# Patient Record
Sex: Female | Born: 1935
Health system: Southern US, Community
[De-identification: ages and names within clinical notes are randomized; demographics above are authoritative.]

## PROBLEM LIST (undated history)

## (undated) DIAGNOSIS — Z9889 Other specified postprocedural states: Secondary | ICD-10-CM

## (undated) DIAGNOSIS — T4145XA Adverse effect of unspecified anesthetic, initial encounter: Secondary | ICD-10-CM

## (undated) DIAGNOSIS — E785 Hyperlipidemia, unspecified: Secondary | ICD-10-CM

## (undated) DIAGNOSIS — I10 Essential (primary) hypertension: Secondary | ICD-10-CM

## (undated) DIAGNOSIS — R112 Nausea with vomiting, unspecified: Secondary | ICD-10-CM

## (undated) DIAGNOSIS — G4730 Sleep apnea, unspecified: Secondary | ICD-10-CM

## (undated) DIAGNOSIS — M1990 Unspecified osteoarthritis, unspecified site: Secondary | ICD-10-CM

## (undated) DIAGNOSIS — T8859XA Other complications of anesthesia, initial encounter: Secondary | ICD-10-CM

## (undated) DIAGNOSIS — J449 Chronic obstructive pulmonary disease, unspecified: Secondary | ICD-10-CM

## (undated) DIAGNOSIS — M1120 Other chondrocalcinosis, unspecified site: Secondary | ICD-10-CM

## (undated) DIAGNOSIS — I773 Arterial fibromuscular dysplasia: Secondary | ICD-10-CM

## (undated) HISTORY — PX: TONSILLECTOMY: SUR1361

## (undated) HISTORY — PX: OTHER SURGICAL HISTORY: SHX169

## (undated) HISTORY — DX: Hyperlipidemia, unspecified: E78.5

## (undated) HISTORY — DX: Essential (primary) hypertension: I10

## (undated) HISTORY — DX: Sleep apnea, unspecified: G47.30

## (undated) HISTORY — DX: Chronic obstructive pulmonary disease, unspecified: J44.9

## (undated) HISTORY — DX: Arterial fibromuscular dysplasia: I77.3

---

## 1997-12-13 ENCOUNTER — Ambulatory Visit (HOSPITAL_COMMUNITY): Admission: RE | Admit: 1997-12-13 | Discharge: 1997-12-13 | Payer: Self-pay | Admitting: Endocrinology

## 1997-12-13 ENCOUNTER — Encounter: Payer: Self-pay | Admitting: Endocrinology

## 1998-10-16 ENCOUNTER — Ambulatory Visit (HOSPITAL_COMMUNITY): Admission: RE | Admit: 1998-10-16 | Discharge: 1998-10-16 | Payer: Self-pay | Admitting: Gastroenterology

## 1998-10-16 ENCOUNTER — Encounter (INDEPENDENT_AMBULATORY_CARE_PROVIDER_SITE_OTHER): Payer: Self-pay | Admitting: Specialist

## 1998-10-17 ENCOUNTER — Ambulatory Visit (HOSPITAL_COMMUNITY): Admission: RE | Admit: 1998-10-17 | Discharge: 1998-10-17 | Payer: Self-pay | Admitting: Gastroenterology

## 1998-10-17 ENCOUNTER — Encounter: Payer: Self-pay | Admitting: Gastroenterology

## 1999-01-03 ENCOUNTER — Encounter: Payer: Self-pay | Admitting: Endocrinology

## 1999-01-03 ENCOUNTER — Ambulatory Visit (HOSPITAL_COMMUNITY): Admission: RE | Admit: 1999-01-03 | Discharge: 1999-01-03 | Payer: Self-pay | Admitting: Endocrinology

## 2000-01-13 ENCOUNTER — Ambulatory Visit (HOSPITAL_COMMUNITY): Admission: RE | Admit: 2000-01-13 | Discharge: 2000-01-13 | Payer: Self-pay | Admitting: Endocrinology

## 2000-01-13 ENCOUNTER — Encounter: Payer: Self-pay | Admitting: Endocrinology

## 2000-01-30 ENCOUNTER — Other Ambulatory Visit: Admission: RE | Admit: 2000-01-30 | Discharge: 2000-01-30 | Payer: Self-pay | Admitting: *Deleted

## 2000-03-13 ENCOUNTER — Other Ambulatory Visit: Admission: RE | Admit: 2000-03-13 | Discharge: 2000-03-13 | Payer: Self-pay | Admitting: *Deleted

## 2000-03-13 ENCOUNTER — Encounter (INDEPENDENT_AMBULATORY_CARE_PROVIDER_SITE_OTHER): Payer: Self-pay | Admitting: Specialist

## 2000-09-08 ENCOUNTER — Inpatient Hospital Stay (HOSPITAL_COMMUNITY): Admission: RE | Admit: 2000-09-08 | Discharge: 2000-09-12 | Payer: Self-pay | Admitting: Neurological Surgery

## 2000-09-08 ENCOUNTER — Encounter: Payer: Self-pay | Admitting: Neurological Surgery

## 2000-09-08 ENCOUNTER — Encounter (INDEPENDENT_AMBULATORY_CARE_PROVIDER_SITE_OTHER): Payer: Self-pay | Admitting: *Deleted

## 2001-01-20 ENCOUNTER — Encounter: Payer: Self-pay | Admitting: *Deleted

## 2001-01-20 ENCOUNTER — Ambulatory Visit (HOSPITAL_COMMUNITY): Admission: RE | Admit: 2001-01-20 | Discharge: 2001-01-20 | Payer: Self-pay | Admitting: *Deleted

## 2001-01-26 ENCOUNTER — Encounter: Admission: RE | Admit: 2001-01-26 | Discharge: 2001-01-26 | Payer: Self-pay | Admitting: *Deleted

## 2001-01-26 ENCOUNTER — Encounter: Payer: Self-pay | Admitting: *Deleted

## 2001-02-01 ENCOUNTER — Other Ambulatory Visit: Admission: RE | Admit: 2001-02-01 | Discharge: 2001-02-01 | Payer: Self-pay | Admitting: *Deleted

## 2002-02-21 ENCOUNTER — Encounter (INDEPENDENT_AMBULATORY_CARE_PROVIDER_SITE_OTHER): Payer: Self-pay | Admitting: Specialist

## 2002-02-21 ENCOUNTER — Ambulatory Visit (HOSPITAL_COMMUNITY): Admission: RE | Admit: 2002-02-21 | Discharge: 2002-02-21 | Payer: Self-pay | Admitting: Gastroenterology

## 2002-03-01 ENCOUNTER — Other Ambulatory Visit: Admission: RE | Admit: 2002-03-01 | Discharge: 2002-03-01 | Payer: Self-pay | Admitting: *Deleted

## 2002-03-03 ENCOUNTER — Encounter: Admission: RE | Admit: 2002-03-03 | Discharge: 2002-03-03 | Payer: Self-pay | Admitting: *Deleted

## 2002-03-03 ENCOUNTER — Encounter: Payer: Self-pay | Admitting: *Deleted

## 2003-02-18 HISTORY — PX: BACK SURGERY: SHX140

## 2003-06-09 ENCOUNTER — Encounter: Admission: RE | Admit: 2003-06-09 | Discharge: 2003-06-09 | Payer: Self-pay | Admitting: Endocrinology

## 2003-10-18 ENCOUNTER — Other Ambulatory Visit: Admission: RE | Admit: 2003-10-18 | Discharge: 2003-10-18 | Payer: Self-pay | Admitting: *Deleted

## 2005-07-21 ENCOUNTER — Encounter: Admission: RE | Admit: 2005-07-21 | Discharge: 2005-07-21 | Payer: Self-pay | Admitting: Endocrinology

## 2005-08-05 ENCOUNTER — Encounter: Admission: RE | Admit: 2005-08-05 | Discharge: 2005-08-05 | Payer: Self-pay | Admitting: Endocrinology

## 2005-09-11 ENCOUNTER — Encounter: Admission: RE | Admit: 2005-09-11 | Discharge: 2005-09-11 | Payer: Self-pay | Admitting: Orthopedic Surgery

## 2005-09-13 ENCOUNTER — Encounter: Admission: RE | Admit: 2005-09-13 | Discharge: 2005-09-13 | Payer: Self-pay | Admitting: Orthopedic Surgery

## 2005-09-22 ENCOUNTER — Encounter (INDEPENDENT_AMBULATORY_CARE_PROVIDER_SITE_OTHER): Payer: Self-pay | Admitting: Specialist

## 2005-09-22 ENCOUNTER — Inpatient Hospital Stay (HOSPITAL_COMMUNITY): Admission: RE | Admit: 2005-09-22 | Discharge: 2005-09-23 | Payer: Self-pay | Admitting: Neurological Surgery

## 2006-09-10 ENCOUNTER — Encounter: Admission: RE | Admit: 2006-09-10 | Discharge: 2006-09-10 | Payer: Self-pay | Admitting: Endocrinology

## 2006-09-16 ENCOUNTER — Encounter: Admission: RE | Admit: 2006-09-16 | Discharge: 2006-09-16 | Payer: Self-pay | Admitting: Endocrinology

## 2007-10-14 ENCOUNTER — Encounter: Admission: RE | Admit: 2007-10-14 | Discharge: 2007-10-14 | Payer: Self-pay | Admitting: Endocrinology

## 2008-01-11 ENCOUNTER — Inpatient Hospital Stay (HOSPITAL_COMMUNITY): Admission: RE | Admit: 2008-01-11 | Discharge: 2008-01-19 | Payer: Self-pay | Admitting: Neurological Surgery

## 2008-01-11 ENCOUNTER — Encounter (INDEPENDENT_AMBULATORY_CARE_PROVIDER_SITE_OTHER): Payer: Self-pay | Admitting: Neurological Surgery

## 2008-04-14 ENCOUNTER — Ambulatory Visit: Admission: RE | Admit: 2008-04-14 | Discharge: 2008-05-22 | Payer: Self-pay | Admitting: Radiation Oncology

## 2008-10-01 ENCOUNTER — Ambulatory Visit (HOSPITAL_BASED_OUTPATIENT_CLINIC_OR_DEPARTMENT_OTHER): Admission: RE | Admit: 2008-10-01 | Discharge: 2008-10-01 | Payer: Self-pay | Admitting: Endocrinology

## 2008-10-01 ENCOUNTER — Encounter: Payer: Self-pay | Admitting: Internal Medicine

## 2008-10-07 ENCOUNTER — Ambulatory Visit: Payer: Self-pay | Admitting: Internal Medicine

## 2008-10-25 ENCOUNTER — Encounter: Admission: RE | Admit: 2008-10-25 | Discharge: 2008-10-25 | Payer: Self-pay | Admitting: Endocrinology

## 2008-12-19 ENCOUNTER — Ambulatory Visit: Payer: Self-pay | Admitting: Internal Medicine

## 2008-12-19 DIAGNOSIS — E785 Hyperlipidemia, unspecified: Secondary | ICD-10-CM

## 2008-12-19 DIAGNOSIS — G4733 Obstructive sleep apnea (adult) (pediatric): Secondary | ICD-10-CM

## 2008-12-19 DIAGNOSIS — I10 Essential (primary) hypertension: Secondary | ICD-10-CM | POA: Insufficient documentation

## 2008-12-24 DIAGNOSIS — J309 Allergic rhinitis, unspecified: Secondary | ICD-10-CM | POA: Insufficient documentation

## 2008-12-26 ENCOUNTER — Encounter: Payer: Self-pay | Admitting: Internal Medicine

## 2009-01-02 ENCOUNTER — Encounter: Payer: Self-pay | Admitting: Internal Medicine

## 2009-01-18 ENCOUNTER — Telehealth: Payer: Self-pay | Admitting: Internal Medicine

## 2009-01-28 ENCOUNTER — Encounter: Payer: Self-pay | Admitting: Internal Medicine

## 2009-01-30 ENCOUNTER — Ambulatory Visit: Payer: Self-pay | Admitting: Internal Medicine

## 2009-01-30 DIAGNOSIS — R011 Cardiac murmur, unspecified: Secondary | ICD-10-CM

## 2009-02-14 ENCOUNTER — Encounter: Payer: Self-pay | Admitting: Internal Medicine

## 2009-02-21 ENCOUNTER — Encounter: Payer: Self-pay | Admitting: Internal Medicine

## 2009-03-26 ENCOUNTER — Encounter: Payer: Self-pay | Admitting: Internal Medicine

## 2009-05-01 ENCOUNTER — Encounter: Payer: Self-pay | Admitting: Internal Medicine

## 2009-07-12 ENCOUNTER — Encounter: Payer: Self-pay | Admitting: Internal Medicine

## 2009-07-30 ENCOUNTER — Ambulatory Visit: Payer: Self-pay | Admitting: Internal Medicine

## 2009-08-08 ENCOUNTER — Ambulatory Visit: Payer: Self-pay | Admitting: Internal Medicine

## 2009-10-29 ENCOUNTER — Ambulatory Visit: Payer: Self-pay | Admitting: Internal Medicine

## 2009-10-29 DIAGNOSIS — R0602 Shortness of breath: Secondary | ICD-10-CM

## 2009-11-01 ENCOUNTER — Ambulatory Visit (HOSPITAL_COMMUNITY): Admission: RE | Admit: 2009-11-01 | Discharge: 2009-11-01 | Payer: Self-pay | Admitting: Internal Medicine

## 2009-11-01 ENCOUNTER — Encounter: Payer: Self-pay | Admitting: Internal Medicine

## 2009-11-01 ENCOUNTER — Ambulatory Visit: Payer: Self-pay

## 2009-11-01 ENCOUNTER — Ambulatory Visit: Payer: Self-pay | Admitting: Internal Medicine

## 2009-12-19 ENCOUNTER — Encounter: Admission: RE | Admit: 2009-12-19 | Discharge: 2009-12-19 | Payer: Self-pay | Admitting: Endocrinology

## 2010-01-06 ENCOUNTER — Encounter: Payer: Self-pay | Admitting: Internal Medicine

## 2010-01-29 ENCOUNTER — Ambulatory Visit: Payer: Self-pay | Admitting: Internal Medicine

## 2010-01-29 DIAGNOSIS — E1149 Type 2 diabetes mellitus with other diabetic neurological complication: Secondary | ICD-10-CM | POA: Insufficient documentation

## 2010-02-27 ENCOUNTER — Encounter: Payer: Self-pay | Admitting: Cardiology

## 2010-02-27 ENCOUNTER — Ambulatory Visit
Admission: RE | Admit: 2010-02-27 | Discharge: 2010-02-27 | Payer: Self-pay | Source: Home / Self Care | Attending: Cardiology | Admitting: Cardiology

## 2010-02-27 ENCOUNTER — Other Ambulatory Visit: Payer: Self-pay | Admitting: Cardiology

## 2010-02-27 DIAGNOSIS — I359 Nonrheumatic aortic valve disorder, unspecified: Secondary | ICD-10-CM | POA: Insufficient documentation

## 2010-02-27 LAB — BRAIN NATRIURETIC PEPTIDE: Pro B Natriuretic peptide (BNP): 59.3 pg/mL (ref 0.0–100.0)

## 2010-03-10 ENCOUNTER — Encounter: Payer: Self-pay | Admitting: Endocrinology

## 2010-03-19 NOTE — Letter (Signed)
Summary: CMN  CMN   Imported By: Valinda Hoar 07/12/2009 09:35:50  _____________________________________________________________________  External Attachment:    Type:   Image     Comment:   External Document

## 2010-03-19 NOTE — Assessment & Plan Note (Signed)
Summary: rov 3 months///kp   Copy to:  Dr. Corrin Parker Primary Chaise Mahabir/Referring Taniqua Issa:  Dr. Corrin Parker  CC:  3 month follow up visit-sleep and allergic rhinitis-using CPAP and O2 every night but would like to possibly come off O2.[name]  History of Present Illness: CC:  6 month follow up visit-breathing is okay; breathing at night is worse; low O2 levels at night per pt.;using CPAP machine at night.. History of Present Illness: January 30, 2009- OSA, Allergic rhinitis Continues CPAP at 15, with supplemental oxygen during sleep at 2 L/M.  We discussed mask comfort and  sleep habits. She is very compliant, as documented by recent download, and she says she sleeps well, without significant daytime sleepiness. We discussed how to adjust the RAMP feature. She is breathing comfortably on room air during the day. Consider PFT on return.  July 30, 2009- OSA, Allergic rhinitis- Now on CPAP at 15 with supplemental oxygen. She sleeps much better and uses it all night every night.. She is concerned about whether she needs the oxygen, prompted by cost of providing the oxygen while on vacation.  She is more dyspneic with exertion than she should be, but no acute change. Activity is limited more by bad knees. Denies cough or wheeze. Denies chest pain or palpitation. Rhinitis- continues nasal stuffines and watery eyes. Some sneeze. Daily Zyrtec.Has flonase used for stretches when needed.  October 29, 2009- OSA, Allergic rhinits, Hypoxia/sleep, Heart murmur Using CPAP 15 with Oxygen at 2. She fights the mask and machine some nights. Bothersome mask leak at times. HCS tried a smaller mask- but never offered much choice. We discussed CPAP desensitiztion with the [facility] Center staff. She also minds needing oxygen, finding it very inconvenient if she needs to go out of town for a while. Denies hx of cardiac disease except murmur, or DVT/ VTE. Anemic in past- never transfused. She wants to see about a  portable concentrator for travel.[name] Has heart murmur and always retains fluid.     Preventive Screening-Counseling & Management  Alcohol-Tobacco     Smoking Status: quit     Packs/Day: 1.0     Year Started: 1954     Year Quit: 1999  Current Medications (verified): 1)  Synthroid 25 Mcg Tabs (Levothyroxine Sodium) .[name].. 1 Once Daily 2)  Amlodipine Besylate 10 Mg Tabs (Amlodipine Besylate) .[name].. 1 Once Daily 3)  Janumet 50-1000 Mg Tabs (Sitagliptin-Metformin Hcl) .[name].. 1 Once Daily 4)  Lipitor 80 Mg Tabs (Atorvastatin Calcium) .[name].. 1 Once Daily 5)  Klor-Con 10 10 Meq Cr-Tabs (Potassium Chloride) .[name].. 1 Once Daily 6)  Gabapentin 600 Mg Tabs (Gabapentin) .[name].. 1 Once Daily 7)  Nabumetone 750 Mg Tabs (Nabumetone) .[name].. 1 Two Times A Day 8)  Fosamax 70 Mg Tabs (Alendronate Sodium) .... Once Per Wk 9)  Aspirin 81 Mg Tbec (Aspirin) .[name].. 1 Once Daily 10)  Zyrtec Allergy 10 Mg Caps (Cetirizine Hcl) .[name].. 1 Once Daily 11)  Calcium Citrate 1200mg  .... Take 1 By Mouth Two Times A Day 12)  Cpap 15, O2 @ 2l/m Hcs 13)  Oxygen 2 L/m Through Cpap, For Sleep 14)  Triamterene-Hctz 37.5-25 Mg Tabs (Triamterene-Hctz) .... Take 1 By Mouth Once Daily  Allergies (verified): 1)  ! * Valtrex  Past History:  Past Surgical History: Last updated: 12/19/2008 Pituitary tumor removed x 2 Back surgery 2005 Tonsillectomy nasal septal deviation  Family History: Last updated: 12/19/2008 Stroke- Father Breast CA- Mother  Social History: Last updated: 12/19/2008 Widowed Children Former smoker.  Quit in 1999.  Smoked for approx 35 yrs up to 1 ppd. 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  Social History: Packs/Day:  1.0  Review of Systems      See HPI       The patient complains of shortness of breath with activity.  The patient denies shortness of breath at rest, productive cough, non-productive cough, coughing up blood, chest pain, irregular heartbeats, acid heartburn, indigestion, loss of appetite, weight change, abdominal pain, difficulty swallowing, sore throat, tooth/dental problems, headaches, nasal congestion/difficulty breathing through nose, and sneezing.    Vital Signs:  Patient profile:   76 year old female Height:      63 inches Weight:      193.25 pounds BMI:     34.36 O2 Sat:      95 % on Room air Pulse rate:   73 / minute BP sitting:   138 / 78  (left arm) Cuff size:   regular  Vitals Entered By: Reynaldo Minium CMA (October 29, 2009 11:43 AM)  O2 Flow:  Room air CC: 3 month follow up visit-sleep and allergic rhinitis-using CPAP and O2 every night but would like to possibly come off O2.   Physical Exam  Additional Exam:  General: A/Ox3; pleasant and cooperative, NAD, overweight SKIN: no rash, lesions NODES: no lymphadenopathy HEENT: [place]/AT, EOM- WNL, proptosis, Conjuctivae- clear, PERRLA, TM-WNL, Nose- clear, Throat- clear and wnl, Mallampati  III NECK: Supple w/ fair ROM, JVD- none, normal carotid impulses w/o bruits Thyroid- normal to palpation CHEST: few rales right mid back HEART: RRR, III/VI systolic murmur mid chest, radiating to bilateral neck ABDOMEN: Soft and nl; nml bowel sounds; no organomegaly or masses noted ZOX:WRUE, nl pulses,  heavy legs , small superficial varices, 1+ edema NEURO: Grossly intact to observation  -   Impression & Recommendations:  Problem # 1:  SLEEP APNEA (ICD-780.57)  She needs mask reassessment and I will offer desensitization with Sleep Center staff Pressure remains at 15  Problem # 2:  DYSPNEA (ICD-786.05)  Question why she desaturates at night. She did smoke a lot, is overweight, and  these may explain her low DLCO. She has heavy legs and occult PE in past can't be rulled out. She  has heart murmur with hemodynamic significance unclear. I will get ECHO, looking especially for right side overload and valvular disease.  We can see if her DME could get her a portable concentrator so she could travel more easily without giving up oxygen at night to do so.,  Medications Added to Medication List This Visit: 1)  Triamterene-hctz 37.5-25 Mg Tabs (Triamterene-hctz) .... Take 1 by mouth once daily 2)  Portable Oxygn Concentrator For Use During Sleep With Travel  .... 2 l/m  Other Orders: Est. Patient Level IV (45409) DME Referral (DME) Echo Referral (Echo)  Patient Instructions: 1)  Please schedule a follow-up appointment in 3 months. 2)  See [facility] about portable concentrator and scheduling Echocardiogram 3)  Flu vax  Prevention & Chronic Care Immunizations   Influenza vaccine: Not documented    Tetanus booster: Not documented    Pneumococcal vaccine: Not documented    H. zoster vaccine: Not documented  Colorectal Screening   Hemoccult: Not documented  Colonoscopy: Not documented  Other Screening   Pap smear: Not documented    Mammogram: Not documented    DXA bone density scan: Not documented   Smoking status: quit  (10/29/2009)  Lipids   Total Cholesterol: Not documented   LDL: Not documented   LDL Direct: Not documented   HDL: Not documented   Triglycerides: Not documented    SGOT (AST): Not documented   SGPT (ALT): Not documented   Alkaline phosphatase: Not documented   Total bilirubin: Not documented  Hypertension   Last Blood Pressure: 138 / 78  (10/29/2009)   Serum creatinine: Not documented   Serum potassium Not documented  Self-Management Support :    Hypertension self-management support: Not documented    Lipid self-management support: Not documented      Appended Document: rov 3 months///kp PFT- 08/08/09 FEV1 1.98/ 107%;  FEV1/FVC 0.77; small airways improved 20% after bronchodilator; Normal Lung Volumes; DLCO reduced at 44%.

## 2010-03-19 NOTE — Letter (Signed)
Summary: CMN CPAP/HCS  CMN CPAP/HCS   Imported By: Lester Bayonne 02/23/2009 08:01:35  _____________________________________________________________________  External Attachment:    Type:   Image     Comment:   External Document

## 2010-03-19 NOTE — Assessment & Plan Note (Signed)
Summary: ROV 6 MONTHS///KP   Copy to:  Dr. Corrin Parker Primary Provider/Referring Provider:  Dr. Corrin Parker  CC:  6 month follow up visit-breathing is okay; breathing at night is worse; low O2 levels at night per pt.;using CPAP machine at night..  History of Present Illness: January 30, 2009- OSA, Allergic rhinitis Continues CPAP at 15, with supplemental oxygen during sleep at 2 L/M.  We ddiscussed mask comfort and  sleep habits. She is very compliant, as documented by recent download, and she says she sleeps well, without significant daytime sleepiness. We discussed how to adjust the RAMP feature. She is breathing comfortably on room air during the day.  Consider PFT on return.  July 30, 2009- OSA, Allergic rhinitis- Now on CPAP at 15 with supplemental oxygen. She sleeps much better and uses it all night every night.. She is concerned about whether she needs the oxygen, prompted by cost of providing the oxygen while on vacation.  She is more dyspneic with exertion than she should be, but no acute change. Activity is limited more by bad knees. Denies cough or wheeze. Denies chest pain or palpitation. Rhinitis- continues nasal stuffines and watery eyes. Some sneeze. Daily Zyrtec.Has flonase used for stretches when needed.     Preventive Screening-Counseling & Management  Alcohol-Tobacco     Smoking Status: quit     Year Started: 1954     Year Quit: 1999  Current Medications (verified): 1)  Synthroid 25 Mcg Tabs (Levothyroxine Sodium) .Marland Kitchen.. 1 Once Daily 2)  Amlodipine Besylate 10 Mg Tabs (Amlodipine Besylate) .Marland Kitchen.. 1 Once Daily 3)  Janumet 50-1000 Mg Tabs (Sitagliptin-Metformin Hcl) .Marland Kitchen.. 1 Once Daily 4)  Lipitor 80 Mg Tabs (Atorvastatin Calcium) .Marland Kitchen.. 1 Once Daily 5)  Klor-Con 10 10 Meq Cr-Tabs (Potassium Chloride) .Marland Kitchen.. 1 Once Daily 6)  Gabapentin 600 Mg Tabs (Gabapentin) .Marland Kitchen.. 1 Once Daily 7)  Nabumetone 750 Mg Tabs (Nabumetone) .Marland Kitchen.. 1 Two Times A Day 8)  Fosamax 70 Mg Tabs  (Alendronate Sodium) .... Once Per Wk 9)  Aspirin 81 Mg Tbec (Aspirin) .Marland Kitchen.. 1 Once Daily 10)  Zyrtec Allergy 10 Mg Caps (Cetirizine Hcl) .Marland Kitchen.. 1 Once Daily 11)  Calcium Citrate 1200mg  .... Take 1 By Mouth Two Times A Day 12)  Cpap 15, O2 @ 2l/m Hcs 13)  Oxygen 2 L/m Through Cpap, For Sleep  Allergies (verified): 1)  ! * Valtrex  Past History:  Past Medical History: Last updated: 12/19/2008 Diabetes Hyperlipidemia Hypertension Sleep Apnea-AHI 89/hr Allergic Rhinitis Pituitary surgery/ replacement  Past Surgical History: Last updated: 12/19/2008 Pituitary tumor removed x 2 Back surgery 2005 Tonsillectomy nasal septal deviation  Family History: Last updated: 12/19/2008 Stroke- Father Breast CA- Mother  Social History: Last updated: 12/19/2008 Widowed Children Former smoker.  Quit in 1999.  Smoked for approx 35 yrs up to 1 ppd. 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    Vital Signs:  Patient profile:   76 year old female Height:      63 inches Weight:      185 pounds BMI:     32.89 O2  Sat:      93 % on Room air Pulse rate:   78 / minute BP sitting:   178 / 98  (left arm) Cuff size:   regular  Vitals Entered By: Reynaldo Minium CMA (July 30, 2009 10:39 AM)  O2 Flow:  Room air CC: 6 month follow up visit-breathing is okay; breathing at night is worse; low O2 levels at night per pt.;using CPAP machine at night.   Physical Exam  Additional Exam:  General: A/Ox3; pleasant and cooperative,  NAD, overweight SKIN: no rash, lesions NODES: no lymphadenopathy HEENT: [place]/AT, EOM- WNL, proptosis, Conjuctivae- clear, PERRLA, TM-WNL, Nose- clear, Throat- clear and wnl, Mallampati  III NECK: Supple w/ fair ROM, JVD- none, normal carotid impulses w/o bruits Thyroid- normal to palpation CHEST: Clear to P&A HEART: RRR, III/VI systolic murmur mid chest, radiating to bilateral neck ABDOMEN: Soft and nl; nml bowel sounds; no organomegaly or masses noted [id number], nl pulses, no edema  NEURO: Grossly intact to observation  -   Impression & Recommendations:  Problem # 1:  CARDIAC MURMUR (ICD-785.2)  She is aware she can have cardiology assessment if needed- she has discussed with Dr Lin Givens.  Problem # 2:  ALLERGIC RHINITIS (ICD-477.9)  Fair control but on zyrtec and flonase. Her updated medication list for this problem includes:    Zyrtec Allergy 10 Mg Caps (Cetirizine hcl) .Marland Kitchen... 1 once daily  Problem # 3:  SLEEP APNEA (ICD-780.57)  The question for today is whether she still needs oxygen during sleep. This includes question of her daytime lung function. We will get PFT and consider when to rechck oxygen during sleep.  Medications Added to Medication List This Visit: 1)  Calcium Citrate 1200mg   .... Take 1 by mouth two times a day  Other Orders: Est. Patient Level IV (10272)  Patient Instructions: 1)  Please schedule a follow-up appointment in 3 months. 2)  If nasal congestion and watery eyes get worse, we can look harder at the allergy situation. 3)  schedule PFT. This will begin to understand your lung functiuon as we consider what to do about oxygen during sleep.

## 2010-03-19 NOTE — Letter (Signed)
Summary: CMN for CPAP Supplies/[facility]  CMN for CPAP Supplies/[facility]   Imported By: Sherian Rein 02/21/2009 07:48:01  _____________________________________________________________________  External Attachment:    Type:   Image     Comment:   External Document

## 2010-03-19 NOTE — Letter (Signed)
Summary: CMN for CPAP Supplies/[facility]  CMN for CPAP Supplies/[facility]   Imported By: Sherian Rein 05/04/2009 11:45:21  _____________________________________________________________________  External Attachment:    Type:   Image     Comment:   External Document

## 2010-03-19 NOTE — Miscellaneous (Signed)
Summary: Orders Update-pft charges//jwr  Clinical Lists Changes  Orders: Added new Service order of Carbon Monoxide diffusing w/capacity (94720) - Signed Added new Service order of Lung Volumes (94240) - Signed Added new Service order of Spirometry (Pre & Post) (94060) - Signed 

## 2010-03-21 NOTE — Assessment & Plan Note (Signed)
Summary: 3 months/apc   Copy to:  Dr. Corrin Parker Primary Provider/Referring Provider:  Dr. Corrin Parker  CC:  3 month follow up visit-sleep-using CPAP each night..  History of Present Illness: July 30, 2009- OSA, Allergic rhinitis- Now on CPAP at 15 with supplemental oxygen. She sleeps much better and uses it all night every night.. She is concerned about whether she needs the oxygen, prompted by cost of providing the oxygen while on vacation.  She is more dyspneic with exertion than she should be, but no acute change. Activity is limited more by bad knees. Denies cough or wheeze. Denies chest pain or palpitation. Rhinitis- continues nasal stuffines and watery eyes. Some sneeze. Daily Zyrtec.Has flonase used for stretches when needed.  October 29, 2009- OSA, Allergic rhinits, Hypoxia/sleep, Heart murmur Using CPAP 15 with Oxygen at 2. She fights the mask and machine some nights. Bothersome mask leak at times. HCS tried a smaller mask- but never offered much choice. We discussed CPAP desensitiztion with the [facility] staff. She also minds needing oxygen, finding it very inconvenient if she needs to go out of town for a while. Denies hx of cardiac disease except murmur, or DVT/ VTE. Anemic in past- never transfused. She wants to see about a portable concentrator for travel.Marland Kitchen Has heart murmur and always retains fluid.  January 29, 2010- OSA, Allergic rhinits, Hypoxia/sleep, Heart murmur Nurse-CC: 3 month follow up visit-sleep-using CPAP each night. Using CPAP all night every night without problem. It definitely has helped. She asks for help understanding results of Echo- EF 60-65%, diastolic CHF grade 1, moderate aortic stenosis.     Preventive Screening-Counseling & Management  Alcohol-Tobacco     Smoking Status: quit     Packs/Day: 1.0     Year Started: 1954     Year Quit: 1999  Current Medications (verified): 1)  Synthroid 25 Mcg Tabs (Levothyroxine Sodium) .Marland Kitchen.. 1 Once  Daily 2)  Amlodipine Besylate 10 Mg Tabs (Amlodipine Besylate) .... Take 1 1/2 By Mouth Once Daily 3)  Janumet 50-1000 Mg Tabs (Sitagliptin-Metformin Hcl) .Marland Kitchen.. 1 Once Daily 4)  Lipitor 80 Mg Tabs (Atorvastatin Calcium) .Marland Kitchen.. 1 Once Daily 5)  Klor-Con 10 10 Meq Cr-Tabs (Potassium Chloride) .Marland Kitchen.. 1 Once Daily 6)  Gabapentin 600 Mg Tabs (Gabapentin) .Marland Kitchen.. 1 Once Daily 7)  Nabumetone 750 Mg Tabs (Nabumetone) .Marland Kitchen.. 1 Two Times A Day 8)  Fosamax 70 Mg Tabs (Alendronate Sodium) .... Once Per Wk 9)  Aspirin 81 Mg Tbec (Aspirin) .Marland Kitchen.. 1 Once Daily 10)  Zyrtec Allergy 10 Mg Caps (Cetirizine Hcl) .Marland Kitchen.. 1 Once Daily 11)  Calcium Citrate 1200mg  .... Take 1 By Mouth Two Times A Day 12)  Cpap 15, O2 @ 2l/m Hcs 13)  Oxygen 2 L/m Through Cpap, For Sleep 14)  Portable Oxygn Concentrator For Use During Sleep With Travel .... 2 L/m  Allergies (verified): 1)  ! * Valtrex  Past History:  Past Medical History: Last updated: 10/29/2009 Diabetes Hyperlipidemia Hypertension Sleep Apnea-AHI 89/hr Allergic Rhinitis Pituitary surgery/ replacement COPD -PFT-08/08/09- FEV1 1.98/107%; R 0.77; Small airway obst w/ resp to dilator; DLCO 44%.  Past Surgical History: Last updated: 12/19/2008 Pituitary tumor removed x 2 Back surgery 2005 Tonsillectomy nasal septal deviation  Family History: Last updated: 12/19/2008 Stroke- Father Breast CA- Mother  Social History: Last updated: 12/19/2008 Widowed Children Former smoker.  Quit in 1999.  Smoked for approx 35 yrs up to 1 ppd. 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  The patient denies shortness of breath at rest, productive cough, chest pain, irregular heartbeats, acid heartburn, indigestion, loss of appetite, weight change, abdominal pain, difficulty  swallowing, sore throat, tooth/dental problems, headaches, nasal congestion/difficulty breathing through nose, and sneezing.    Vital Signs:  Patient profile:   76 year old female Height:      63 inches Weight:      199 pounds BMI:     35.38 O2 Sat:      96 % on Room air Pulse rate:   74 / minute BP sitting:   128 / 78  (left arm) Cuff size:   regular  Vitals Entered By: Reynaldo Minium CMA (January 29, 2010 11:28 AM)  O2 Flow:  Room air CC: 3 month follow up visit-sleep-using CPAP each night.   Physical Exam  Additional Exam:  General: A/Ox3; pleasant and cooperative, NAD, overweight SKIN: no rash, lesions NODES: no lymphadenopathy HEENT: [place]/AT, EOM- WNL, proptosis, Conjuctivae- clear, PERRLA, TM-WNL, Nose- clear, Throat- clear and wnl, Mallampati  III NECK: Supple w/ fair ROM, JVD- none, normal carotid impulses w/o bruits Thyroid- normal to palpation CHEST: few crackles right mid back HEART: RRR, III/VI systolic murmur mid chest, radiating to bilateral neck ABDOMEN: Soft and nl; nml bowel sounds; no organomegaly or masses noted ZOX:WRUE, nl pulses,  heavy legs , small superficial varices, 1+ edema NEURO: Grossly intact to observation  -   Impression & Recommendations:  Problem # 1:  SLEEP APNEA (ICD-780.57)  Good compliance and control. She will continue with CPAP  Orders: Est. Patient Level IV (45409) Sleep Disorder Referral (Sleep Disorder)  Problem # 2:  CARDIAC MURMUR (ICD-785.2)  Moderate aortic stenosis and also Diastolic Dysfunction as seen on Echo. I recommended she let a cardiologist see her to discuss.  Medications Added to Medication List This Visit: 1)  Amlodipine Besylate 10 Mg Tabs (Amlodipine besylate) .... Take 1 1/2 by mouth once daily  Other Orders: Cardiology Referral (Cardiology)  Patient Instructions: 1)  Please schedule a follow-up appointment in 1 year.Please  call sooner as needed 2)  Continue CPAP at 15 with oxygen for sleep at 2  L 3)  See [facility] to set up cardiology referral for your aortic stenosis 4)  [facility] can also get you an appointment to work on CPAP mask comfort with the sleep center staff

## 2010-03-21 NOTE — Assessment & Plan Note (Signed)
Summary: np6/aortic stynosis/lg   Visit Type:  Follow-up Primary Provider:  Dr. Corrin Parker  CC:  Aortic stenosis.  History of Present Illness: The patient is referred for evaluation of aortic stenosis. She has a history of a heart murmur. She has had progressive dyspnea and is treated by Dr. Maple Hudson. She says this has been slowly progressive but worse in the last 6 months. She will get dyspneic walking up a flight of stairs though she can continue on when she gets to the top. She does not describe resting shortness of breath, PND or orthopnea. She does not describe chest pressure, neck or arm discomfort. She denies palpitations, presyncope or syncope. Unfortunately she somewhat limited in that could be because of joint pains.  He was recently sent for an echocardiogram which demonstrated moderate calcification of her aortic valve with moderate stenosis. She has reported a murmur for some time which has been followed clinically. She has had no prior cardiovascular testing.  Current Medications (verified): 1)  Synthroid 25 Mcg Tabs (Levothyroxine Sodium) .Marland Kitchen.. 1 Once Daily 2)  Amlodipine Besylate 10 Mg Tabs (Amlodipine Besylate) .... Take 1 1/2 By Mouth Once Daily 3)  Janumet 50-1000 Mg Tabs (Sitagliptin-Metformin Hcl) .Marland Kitchen.. 1 Once Daily 4)  Lipitor 80 Mg Tabs (Atorvastatin Calcium) .Marland Kitchen.. 1 Once Daily 5)  Klor-Con 10 10 Meq Cr-Tabs (Potassium Chloride) .Marland Kitchen.. 1 Once Daily 6)  Gabapentin 600 Mg Tabs (Gabapentin) .Marland Kitchen.. 1 Once Daily 7)  Nabumetone 750 Mg Tabs (Nabumetone) .Marland Kitchen.. 1 Two Times A Day 8)  Fosamax 70 Mg Tabs (Alendronate Sodium) .... Once Per Wk 9)  Aspirin 81 Mg Tbec (Aspirin) .Marland Kitchen.. 1 Once Daily 10)  Zyrtec Allergy 10 Mg Caps (Cetirizine Hcl) .Marland Kitchen.. 1 Once Daily 11)  Calcium Citrate 1200mg  .... Take 1 By Mouth Two Times A Day 12)  Cpap 15, O2 @ 2l/m Hcs 13)  Oxygen 2 L/m Through Cpap, For Sleep 14)  Portable Oxygn Concentrator For Use During Sleep With Travel .... 2 L/m  Allergies  (verified): 1)  ! * Valtrex  Past History:  Past Medical History: Diabetes Hyperlipidemia Hypertension Sleep Apnea-AHI 89/hr Allergic Rhinitis Pituitary surgery/ replacement COPD -PFT-08/08/09- FEV1 1.98/107%; R 0.77; Small airway obst w/ resp to dilator; DLCO 44%. Fibromuscular dysplasia/RAS  Past Surgical History: Pituitary tumor removed x 2 Back surgery 2005 Tonsillectomy Nasal septal deviation Bilateral renal bypass  Social History: Widowed Children Former smoker.  Quit in 1999.  Smoked for approx 35 yrs up to 1 ppd. No ETOH Lives alone Retired Programmer, systems    Review of Systems       As stated in the HPI and negative for all other systems.   Vital Signs:  Patient profile:   76 year old female Height:      63 inches Weight:      196 pounds BMI:     34.85 Pulse rate:   72 / minute Resp:     16 per minute BP sitting:   168 / 98  (right arm)  Vitals Entered By: Marrion Coy, CNA (February 27, 2010 10:24 AM)  Physical Exam  General:  Well developed, well nourished, in no acute distress. Head:  normocephalic and atraumatic Eyes:  PERRLA/EOM intact; conjunctiva and lids normal. Mouth:  Teeth, gums and palate normal. Oral mucosa normal. Neck:  Neck supple, no JVD. No masses, thyromegaly or abnormal cervical nodes. Chest Wall:  no deformities or breast masses noted Lungs:  Clear bilaterally to auscultation and percussion. Abdomen:  Bowel sounds  positive; abdomen soft and non-tender without masses, organomegaly, or hernias noted. No hepatosplenomegaly. Msk:  Back normal, normal gait. Muscle strength and tone normal. Extremities:  No clubbing or cyanosis, mild bilateral nonpitting lower extremity edema Neurologic:  Alert and oriented x 3. Skin:  Intact without lesions or rashes. Cervical Nodes:  no significant adenopathy Axillary Nodes:  no significant adenopathy Inguinal Nodes:  no significant adenopathy Psych:  Normal  affect.   Detailed Cardiovascular Exam  Neck    Carotids: Transmitted systolic murmur    Neck Veins: Normal, no JVD.    Heart    Inspection: no deformities or lifts noted.      Palpation: normal PMI with no thrills palpable.      Auscultation: S1 and S2 within normal limits, no S3, no S4, no clicks, no rubs, 3/6 apical systolic murmur radiating slightly out of the aortic outflow tract, no diastolic murmurs.  Vascular    Abdominal Aorta: no palpable masses, pulsations, or audible bruits.      Femoral Pulses: normal femoral pulses bilaterally.      Pedal Pulses: normal pedal pulses bilaterally.      Radial Pulses: normal radial pulses bilaterally.      Peripheral Circulation: no clubbing, cyanosis, or with normal capillary refill.     EKG  Procedure date:  02/27/2010  Findings:      Sinus rhythm, rate 71, axis within normal limits, intervals within normal limits, no acute ST-T wave changes  Impression & Recommendations:  Problem # 1:  AORTIC VALVE DISORDERS (ICD-424.1) Patient has moderate aortic stenosis. I doubt that this is contributing to her dyspnea we'll assess this with a BNP level. We talked at length about the physiology, symptoms and management of this condition. She will get an echocardiogram again in one year to make sure it is not progressing rapidly  Problem # 2:  DYSPNEA (ICD-786.05) I believe this to be multifactorial and will check the BNP level. We also discussed the need for diet and exercise.  Problem # 3:  HYPERTENSION (ICD-401.9) Her blood pressure is elevated but she did not take her medications. I asked her to get a home blood pressure monitor and keep a diary to make sure she has adequate control. However, today I will not change her regimen.  Other Orders: TLB-BNP (B-Natriuretic Peptide) (83880-[id number])  Patient Instructions: 1)  Your physician recommends that you schedule a follow-up appointment in: 1 year. Also have echocardiogram done in one  year. 2)  Your physician recommends that you continue on your current medications as directed. Please refer to the Current Medication list given to you today. 3)  Your physician has requested that you have an echocardiogram.  Echocardiography is a painless test that uses sound waves to create images of your heart. It provides your doctor with information about the size and shape of your heart and how well your heart's chambers and valves are working.  This procedure takes approximately one hour. There are no restrictions for this procedure. To be done in one year.

## 2010-03-29 ENCOUNTER — Encounter: Payer: Self-pay | Admitting: Internal Medicine

## 2010-04-16 NOTE — Letter (Signed)
Summary: CMN for PAP Device/[employer]  CMN for PAP Device/[employer]   Imported By: Sherian Rein 04/08/2010 09:28:56  _____________________________________________________________________  External Attachment:    Type:   Image     Comment:   External Document

## 2010-05-08 ENCOUNTER — Telehealth: Payer: Self-pay | Admitting: Cardiology

## 2010-05-08 NOTE — Telephone Encounter (Signed)
LOV,12 lead faxed to [facility] @ [phone]

## 2010-07-02 NOTE — Op Note (Signed)
NAMEREKA, WIST NO.:  [id number]   MEDICAL RECORD NO.:  [id number]          PATIENT TYPE:  INP   LOCATION:  3101                         FACILITY:  [facility]   PHYSICIAN:  Stefani Dama, M.D.  DATE OF BIRTH:  02-23-1934   DATE OF PROCEDURE:  01/11/2008  DATE OF DISCHARGE:                               OPERATIVE REPORT   PREOPERATIVE DIAGNOSIS:  Recurrent pituitary tumor.   POSTOPERATIVE DIAGNOSIS:  Recurrent pituitary tumor.   PROCEDURE:  Transsphenoidal re-resection of pituitary tumor.   SURGEON:  Stefani Dama, MD approaching closures by Dr. Jearld Fenton.   INDICATIONS:  [name] is a 76 year old individual who has had  significant episode of a pituitary tumor in the past.  She was nearly  blind from this lesion and was resected about 7 years ago by myself and  Dr. Jearld Fenton.  She was followed closely and had some evidence of recurrence  in this, grew slowly to present point where it seems to have eroded  through the posterior wall of the clivus, and now extends into the  intracranial space and again is causing some optic nerve compression.  She was taken back to the operating room to undergo surgical re-  resection.   PROCEDURE:  The patient was brought to the operating room, placed on  table in supine position.  After smooth induction of general  endotracheal anesthesia and placement of appropriate arterial and venous  monitoring lines and a Foley catheter, the patient was placed in the  three-point headrest.  The head turned slightly to the left side and  slightly extended and the fluoroscopy unit was placed to obtain a cross-  table lateral image.  With this image available, then Dr. Jearld Fenton prepped  the abdomen and the region around the nose and started the approach  until we entered the sphenoid sinus and reach the posterior wall of the  sinus.  At this point, I scrubbed and evaluated the region of the  posterior wall.  There was noted be septal  wall off to the patient's  right side and this was a membrane that was cauterized and then incised.  Through this, some fatty tissue was encountered.  This was felt to be  the previous repair.  Ultimately, I continued to dissect through this  area and found a cash of tumor beyond some membranes on the far side of  the fatty repair.  The tumor was suctioned out and samples of this were  sent in a Luque trap to be saved as specimen.  Further resection then  was performed by dissecting through several membranes and this tumor was  noted to be very septated somewhat fibrous with some gelatinous  interspersing of tumor, did not appear to be homogeneous as the MRI  would have suggested.  The resection was performed very slowly and  cautiously, all the time being careful to protect any lateral structures  near the region of the cavernous sinus.  Ultimately, we reached a  portion of the tumor near the superior border where dissecting through  an additional membrane yielded significant leakage of spinal fluid.  It  was felt that this represented the superior extent.  Careful resection  this area yielded no other tumor.  Membranes were noted to be somewhat  bound down to surrounding tissues underneath them.  No significant  bleeding was obtained here; however, thus some small pledgets of Gelfoam  soaked and thrombin were used to obtain some tamponade for both the  spinal fluid and some minimal capillary-type bleeding in the membrane  itself.  Then dissection was obtained posteriorly and in the posterior  aspect of the tumor, a thin wall of cartilage was encountered.  It was  suspected that this may have been the cartilage from the original repair  of the sellar floor.  This, however, was the posterior wall of the  clivus and on removing this, more CSF was encountered and it was able to  view the basilar artery as it traveled cephalad.  With this, further  resection was not felt to be possible and it  was hoped that good  resection of the tumor had been obtained by this point.  With this, the  CSF leakage from the posterior wall and the inferior wall of the  resection cavity was controlled with some pledgets of Gelfoam soaked and  thrombin.  They were later removed and then a pledget of DuraGen was  placed into the defect and this was sealed over with a layer of Tisseel.  Over this, then was placed a layer of the patient's own fat which was  obtained through a separate incision on the left upper quadrant where  some small pieces of subcutaneous fat were obtained and placed into the  defect.  A second layer of Tisseel was placed over this and then an  another layer of the DuraGen material was placed over this and then the  procedure was turned back over to Dr. Jearld Fenton for final closure of the  pituitary sinus itself with further packing the fat.  The patient  tolerated this portion of the procedure well without any changes in  vital signs.  No significant increase in urine output was noted during  this case.  At the end of the procedure after Dr. Jearld Fenton had performed  this closure, the patient was turned into left lateral decubitus  position and I placed a lumbar drain in the region of approximately L3,  L4.  The lumbar drain will be left in place during early postoperative  period until we were certain that no CSF leakage occurs.      Stefani Dama, M.D.  Electronically Signed     HJE/MEDQ  D:  01/11/2008  T:  01/12/2008  Job:  [id number]

## 2010-07-02 NOTE — Procedures (Signed)
NAMENUSAIBA, GUALLPA NO.:  [id number]   MEDICAL RECORD NO.:  [id number]          PATIENT TYPE:  OUT   LOCATION:  SLEEP CENTER                 FACILITY:  [facility]   PHYSICIAN:  Clinton D. Maple Hudson, MD, FCCP, FACPDATE OF BIRTH:  1934/05/[birth year]   DATE OF STUDY:  10/01/2008                            NOCTURNAL POLYSOMNOGRAM   REFERRING PHYSICIAN:   INDICATION FOR STUDY:  Hypersomnia with sleep apnea.   EPWORTH SLEEPINESS SCORE:  Epworth sleepiness score 13/24, BMI 36.3.  Weight 205 pounds, height 63 inches.  Neck 16 inches.   MEDICATIONS:  Home medications charted and reviewed.   SLEEP ARCHITECTURE:  Split study protocol.  During the diagnostic phase,  total sleep time 129.5 minutes with sleep efficiency 65.6%.  Stage I was  15.4%, stage II 84.6%, stages III and REM were absent.  Sleep latency 11  minutes.  Awake after sleep onset 52 minutes.  Arousal index 11.1.   Bedtime Medication: Sleep Aid and Neurontin.   RESPIRATORY DATA:  Split study protocol.  During the diagnostic phase,  apnea/hypopnea index (AHI) 89 per hour.  A total of 192 events was  scored including 1 mixed apnea and 191 hypopneas.  Events were not  positional.  CPAP was then titrated to 15 CWP, AHI 6.9 per hour.  She  wore a medium ResMed Full-Face Quattro mask with heated humidifier.  Supplemental oxygen was added.   OXYGEN DATA:  Moderately loud snoring with oxygen desaturation to a  nadir of 67% on room air before CPAP and supplemental oxygen.  With CPAP  control and persistent hypoxemia, oxygen was supplemented at 1 L per  minute.  Mean oxygen saturation then held at 90%.   CARDIAC DATA:  Sinus rhythm.   MOVEMENT-PARASOMNIA:  No significant movement disturbance during the  diagnostic phase.  With the disturbance of CPAP titration, limb jerks  were noted with a little definite effect on sleep.  Bathroom x4.   IMPRESSIONS-RECOMMENDATIONS:  1. Severe obstructive sleep apnea/hypopnea syndrome, AHI  89 per hour      with nonpositional events, moderately loud snoring, and room air      oxygen desaturation to a nadir of 67%.  2. Successful CPAP titration to 15 CWP, AHI 6.9 per hour.  She wore a      medium ResMed Full-Face Quattro mask with heated humidifier.  3. Because of persistent oxygen desaturation to a mean of 86.4% on      room air with CPAP, supplemental oxygen was added at 1 L per minute      through her CPAP, subsequently holding oxygen saturation around 90-      91%.  4. Bathroom x4, contributing to sleep disturbance.  Limb jerks were      noted during the CPAP titration phase as is commonly seen because      of the unfamiliarity of CPAP.  These rarely appeared to cause sleep      disturbance but can be addressed with specific therapy such as      ReQuip or Mirapex if clinically indicated because of persistent      movement related disturbance after she has adjusted to CPAP at  home.  5. The technician indicated there had been a request to perform an      arterial blood gas sample during the sleep testing.  [facility] not have facility for providing that testing.      Clinton D. Maple Hudson, MD, [facility], FACP  Diplomate, Biomedical engineer of Sleep Medicine  Electronically Signed     CDY/MEDQ  D:  10/07/2008 11:32:20  T:  10/08/2008 21:30:86  Job:  [id number]

## 2010-07-02 NOTE — Op Note (Signed)
NAMEKIYRA, SLAUBAUGH NO.:  [id number]   MEDICAL RECORD NO.:  [id number]          PATIENT TYPE:  INP   LOCATION:  2899                         FACILITY:  [facility]   PHYSICIAN:  Suzanna Obey, M.D.       DATE OF BIRTH:  Sep 29, 1934   DATE OF PROCEDURE:  01/11/2008  DATE OF DISCHARGE:                               OPERATIVE REPORT   PREOPERATIVE DIAGNOSIS:  Pituitary tumor.   POSTOPERATIVE DIAGNOSIS:  Pituitary tumor.   SURGICAL PROCEDURE:  Transsphenoidal approach to pituitary tumor.   ANESTHESIA:  General.   ESTIMATED BLOOD LOSS:  Approximately 100 mL.   INDICATIONS:  This is a 76 year old who has previously had a pituitary  tumor that was resected and now she has recurrence of the tumor and Dr.  Danielle Dess has elected to proceed with a revision tumor removal.  The  patient was informed of the approach via transsphenoidal through her  septum and she was informed of the risk and benefits as well as options.  All the questions were answered and consent was obtained.   OPERATION:  The patient was taken to the operating room and placed in  supine position.  After a general endotracheal tube anesthesia was  placed in the supine position, prepped and draped in the usual sterile  manner.  The outline of the previous reverse gull wing incision was  performed and the patient had an incision made after Afrin pledgets were  placed and the injection of the septum and columella was performed with  1% lidocaine with 1:100,000 epinephrine.  The right medial crura was  dissected free and swept off into the left side and the septum was  brought off the nasal spine and the remaining anterior cartilage was  swept into the left nasal cavity.  The flap was elevated on the opposite  side and dissected straight back raising the flap which was scarred of  course back to the sphenoid opening.  This was opened up and it was  exposed.  The procedure for tumor was then performed by Dr. Danielle Dess to  be  dictated as separate operative report.  Once closed, the sphenoid was  filled with fat and the flaps were laid back into the anatomic position.  The medial crura was secured back in its position with 4-0 chromic and  the subcu closed with 4-0 chromic.  The hemitransfixion incision was  closed with 4-0 chromic and a 4-0 plain gut quilting stitch placed to  the septum.  The incision and the skin of the base of the columella  closed with interrupted 4-0 nylon.  Telfa rolls soaked in bacitracin  were placed into the nose bilaterally and secured with a 3-0 nylon.  There was no violation of the flaps with perforation.  The patient was  then returned over to Dr. Danielle Dess to complete the closure of the abdomen  and the spinal drain.           ______________________________  Suzanna Obey, M.D.    JB/MEDQ  D:  01/11/2008  T:  01/12/2008  Job:  [id number]   cc:   Stefani Dama, M.D.

## 2010-07-05 NOTE — Op Note (Signed)
   NAMEMIKAELAH, [name]                    ACCOUNT NO.:  [id number]   MEDICAL RECORD NO.:  [id number]                   PATIENT TYPE:  AMB   LOCATION:  ENDO                                 [facility]ACILITY[facility]   PHYSICIAN:  John C. Madilyn Fireman, M.D.                 DATE OF BIRTH:  Feb 10, 1935   DATE OF PROCEDURE:  02/21/2002  DATE OF DISCHARGE:                                 OPERATIVE REPORT   PROCEDURE:  Colonoscopy with polypectomy.   INDICATION FOR PROCEDURE:  Adenomatous colon polyps on the index colonoscopy  three years ago.   DESCRIPTION OF PROCEDURE:  The patient was placed in the left lateral  decubitus position and placed on the pulse monitor with continuous low-flow  oxygen delivered by nasal cannula.  She was sedated with 100 mcg IV fentanyl  and 10 mg IV Versed.  The Olympus video colonoscope was inserted into the  rectum and advanced to the cecum, confirmed by transillumination at  McBurney's point and visualization of the ileocecal valve and the  appendiceal orifice.  It was quite difficult getting to the cecum due to  redundancy of the colon, looping, and resistance at multiple places, and the  prep was suboptimal in some areas, particularly the right colon.  I thus  could not rule out small lesions less than 1 cm in all areas.  Otherwise,  ,the cecum, ascending,  transverse, descending, and sigmoid colon all  appeared normal with no masses, polyps, diverticula, or other mucosal  abnormalities.  Within the rectum there were three or four small sessile  polyps, the largest of which was approximately 8 mm in diameter and was  removed by snare.  The next largest was fulgurated by hot biopsy.  There  were some diminutive polyps, which I did not fulgurate.  The scope was then  withdrawn, and the patient returned to the recovery room in stable  condition.  She tolerated the procedure well, and there were no immediate  complications.   IMPRESSION:  Rectal polyps.   PLAN:   Await histology to determine interval for next colonoscopy.                                                John C. Madilyn Fireman, M.D.    JCH/MEDQ  D:  02/21/2002  T:  02/21/2002  Job:  [id number]   cc:   Alfonse Alpers. Dagoberto Ligas, M.D.  [address], [address]  [place]  [place] [address]  Fax: [phone]

## 2010-07-05 NOTE — Discharge Summary (Signed)
[name], Breanna NO.:  Castillo   MEDICAL RECORD NO.:  [id number]          PATIENT TYPE:  INP   LOCATION:  3101                         FACILITY:  [facility]   PHYSICIAN:  Stefani Dama, M.D.  DATE OF BIRTH:  [birth year]   DATE OF ADMISSION:  01/11/2008  DATE OF DISCHARGE:  01/19/2008                               DISCHARGE SUMMARY   ADMITTING DIAGNOSIS:  Recurrent pituitary tumor.   SECONDARY DIAGNOSES:  1. Hypokalemia.  2. Diabetes type 2, non-insulin dependent.  3. Hypertension.  4. Hypercholesterolemia.   DISCHARGE DIAGNOSES:  1. Hypokalemia.  2. Diabetes type 2, non-insulin dependent.  3. Hypertension.  4. Hypercholesterolemia.  5. Acute postoperative blood loss anemia.   HOSPITAL COURSE AND BRIEF HISTORY:  The patient is a 76 year old female  with a recurrent pituitary tumor and arrangements were made for  transsphenoidal resection of the tumor.  She underwent transsphenoidal  resection of the tumor on January 11, 2008.  She tolerated the  procedure well.  Dr. Danielle Dess and Dr. Jearld Fenton did the surgery.  Consultation  from Dr. Dagoberto Ligas was arranged for assistance with medical management.  First day postoperatively, the patient was doing well.  Her drains  remained in place in her low back as well as packing of her nasal  passage.  She had some hypokalemia, potassium was being replaced, Actos  for her diabetes.  It was okay for her to get out of bed with clamping  of the drain.  Her blood pressure was labile and she was placed on  hydralazine p.r.n.  Dr. Leslie Dales was also helping cover for her  endocrine issues during her hospitalization.  She continued to make slow  progress.  We kept her drain in place until she tolerated a test of  clamping it.  We ultimately had ENT removed the packing on January 17, 2008, and then clamped her drain and discharged her lumbar drain on  January 18, 2008.  She tolerated this well.  She remained asymptomatic  and she  was ready for discharge home.  On January 19, 2008, she was  eating well, voiding well, having no headaches, stable.   DISCHARGE CONDITION:  Stable and improved.   DISCHARGE INSTRUCTIONS:  Discharge home.  Home durable medical equipment  was ordered as needed.  Prescription for Actos, hydrocortisone, Benicar.  Foley catheter was discontinued prior to discharge home.  Follow up with  Dr. Dagoberto Ligas in an outpatient followup, with [place] in 2-3 weeks.  With Dr.  Danielle Dess, call for an appointment.  Contact us prior to followup with any questions or concerns.  She is  instructed on nasal hygiene.  Contact us if she has any increased  drainage.  She will continue on her home diabetic diet.  All questions  encouraged, answered, and addressed.  Contact us is she is running high  fevers or any concerns of nasal drainage.      [name].      Stefani Dama, M.D.  Electronically Signed    SCI/MEDQ  D:  03/30/2008  T:  03/30/2008  Job:  [id number]

## 2010-07-05 NOTE — Op Note (Signed)
[name], [name] NO.:  [id number]   MEDICAL RECORD NO.:  [id number]          PATIENT TYPE:  INP   LOCATION:  2899                         FACILITY:  [facility]   PHYSICIAN:  Stefani Dama, M.D.  DATE OF BIRTH:  1934-11-15   DATE OF PROCEDURE:  09/22/2005  DATE OF DISCHARGE:                                 OPERATIVE REPORT   PREOPERATIVE DIAGNOSES:  1. Herniated nucleus pulposus, L2-3 left, with left lumbar radiculopathy  2. T11 pathologic compression fracture secondary to osteoporosis.   POSTOPERATIVE DIAGNOSES:  1. Herniated nucleus pulposus, L2-3 left, with left lumbar radiculopathy  2. T11 pathologic compression fracture secondary to osteoporosis.   PROCEDURES:  1. T11 acrylic balloon kyphoplasty with biopsy of T11 vertebral body.  2. Left L2-3 METRx extraforaminal diskectomy with operating microscope,      microdissection technique.   SURGEON:  Stefani Dama, M.D.   FIRST ASSISTANT:  Hewitt Shorts, M.D.   ANESTHESIA:  General endotracheal.   INDICATIONS:  [name] is a 76 year old individual who had  complaints of two significant back pains.  One occurred in the left  paralumbar region radiating into the left buttock, anterior thigh and groin  area.  She simultaneously developed pain in the region of the right costal  angle at the thoracolumbar junction.  Both these pains were quite severe and  unrelenting and had been going on for approximately 5-6 weeks' time.  She  had no relief with conservative management, was now a requiring substantial  doses of narcotic analgesia without significant relief, and in the last week  she underwent an MRI of the lumbar spine, which demonstrated that there was  a pathologic compression fracture of the superior endplate of T11 with  approximately 50% collapse of the vertebra.  There was also the presence of  an extraforaminal disk protrusion at L2-3 on the left-hand side that  appeared to be  elevating the L2 nerve root and causing some compromise of  it.  She was evaluated in the office this past Friday and advised regarding  surgical decompression and is now taken to the operating room for this  procedure.   PROCEDURE:  The patient was brought to the operating room supine on a  stretcher.  After smooth induction of general endotracheal anesthesia, she  underwent placement of a Foley catheter and was turned prone.  The back was  then prepped with DuraPrep and draped in a sterile fashion.  Biplane  fluoroscopy was then used to localize the T11 vertebra in both planes.  The  10 o'clock position of the left pedicle approximately 2 cm superior and  lateral to the pedicle itself was chosen as the entry site and this area was  infiltrated with 1% lidocaine with epinephrine for a total of 6 mL all the  way down through the soft tissues.  A standard Jamshidi needle was then used  to enter the lateral superior aspect of the pedicle at the T11 vertebra.  This was then inserted through the pedicle down to the base of its  attachment to the vertebral body.  This was checked with biplane  fluoroscopy.  When the appropriate trajectory was noted, the needle was  advanced into the body for approximately 3 mm.  The central trocar was then  removed and a biopsy probe was used to obtain a core measuring approximately  1.5 cm in length of the T11 vertebra.  This was obtained and sent for a  specimen.  A balloon was then entered into this void and with sequential  administration, the pressure in the balloon was elevated to first 80 mmHg,  allowed to decay, and then subsequently was allowed to increase to 150 mmHg  and also allowed to decay.  The decay rate descended to approximately 80 mm  of mercury.  A total volume of about 3.5 mL was placed in the balloon and  this was observed with sequential fluoroscopy, identifying good restoration  of the vertebral body height, particularly for the superior  endplate of the  C11 vertebra.  The filling of the balloon went across the midline and  appeared to nicely fill the region of the superior end plate compression.  Once it was felt that no further expansion was necessary, methyl  methacrylate glue which had previously been mixed and allowed to set to the  appropriate consistency was then injected for a total of 3.5 mL of volume  being placed into the vertebral body defect after the balloon was removed.  The filling of the defect was noted on biplane fluoroscopy.  No  extravasation was noted.  In the end the portion in the trocar itself was  tamped down to leave no filling defects outside of the vertebral body.  The  trocar itself was then removed without any difficulty.  Final AP and lateral  images were obtained.  A singular 3-0 Vicryl stitch was placed in the entry  site.  Attention was then turned to the second part of the procedure, which  was the L2-L3 diskectomy.  Now through a separate entry with the biplane  fluoroscopy being used to localize the L2-L3 level, the skin above the  extraforaminal portion was infiltrated with 6 mL of lidocaine with  epinephrine and after proper needle localization of the pars on the L2-L3  interspace, a vertical incision in this area was made approximately 15 mm in  length.  A K-wire was then passed down to the laminar arch of the L2  vertebra and when this was finally localized, the first dilator was placed  over the K-wire, the K-wire itself was removed, and then a wanding technique  was used to dissect the soft tissues along the dorsal and lateral surface of  the pars at L2.  Once this dissection was felt to be adequate, a series of  dilators to the 18 mm size was placed over this opening and an 18 mm x 6 cm  long endoscopic cannula was affixed to the operating table with a clamp.  Through this aperture the microscope was brought into the field.  The pars was then cleared with a monopolar cautery of  the soft tissue attachments in  this area and once the pars was clearly identified, the superior articular  process of L3 could be identified.  The fascia in this area was then cleared  also with a 2 mm and a 3 mm Kerrison punch and then the lateral portion of  the pars was drilled down with a high-speed bur and a 2.3-mm dissecting  tool.  A 2 mm Kerrison punch was then used to remove the thin portion of the  bone underneath this.  The fascia in the intertransverse space was removed  and immediately the L2 nerve root was identified, being both dorsally and  coming out at a rather perpendicular angle to the dural tube.  Just  underneath this, soft tissue dissection yielded a glistening surface of a  stretched and thin posterior longitudinal ligament.  This was incised.  Immediately some small fragments of disk extruded themselves.  These were  removed and allowed for the start of a good decompression of the L2 nerve  root right at the area of the dorsal root ganglion.  This area was carefully  protected and the nerve root was dissected away from the mass as the mass  was removed in a piecemeal fashion, revealing several small fragments of  disk.  Then A series of ball-tip probes were used to remove and dissect out  several other fragments of disk along the lateral path of the nerve root.  The medial aspect was similarly inspected.  The edge of the common dural  tube was identified.  No disk material was identified here.  In the end the  L2 nerve root laid more naturally, had an oblique angle as it exited the  foramen.  Pressure on the undersurface of it was relieved.  Hemostasis in  the epidural area around the nerve root was obtained with the bipolar  cautery once this was adequately ascertained.  The area was infiltrated with  1.5 mL of a mixture of Depo-Medrol and fentanyl.  The mixture was Depo-  Medrol 2 mL, 40 mg/mL, and 1 mL of fentanyl 50 mcg/mL.  This was allowed to  stay in the  epidural space around the L2 nerve root.  The endoscopic cannula  was removed.  The lumbar dorsal fascia was closed with a singular 3-0 Vicryl  and then 3-0 Vicryl used to close the subcuticular skin.  The patient  tolerated the procedure well and was returned to the recovery room in stable  condition.      Stefani Dama, M.D.  Electronically Signed     HJE/MEDQ  D:  09/22/2005  T:  09/22/2005  Job:  [id number]

## 2010-07-05 NOTE — Op Note (Signed)
[place]. [facility]  Patient:    [name], [name]                   MRN: [id number] Proc. Date: 09/08/00 Attending:  Stefani Dama, M.D.                           Operative Report  PREOPERATIVE DIAGNOSES:  Pituitary tumor, blindness.  POSTOPERATIVE DIAGNOSES:  Pituitary tumor, blindness.  PROCEDURE:  Transsphenoidal resection of tumor.  SURGEON:  Margit Banda. Jearld Fenton, M.D., approach, and Stefani Dama, M.D., resection of tumor.  ANESTHESIA:  General endotracheal.  INDICATIONS:  Patient is a 76 year old individual who has reported progressive blindness for a period of about two years.  In the past several weeks it seems to have become more acute.  The patient was diagnosed with 20/200 vision and on checking her today, she seems to have 20/800 vision in each eye tested individually, 20/200 with both eyes together.  Patient has severe bitemporal hemianopsia.  She was advised regarding surgery.  DESCRIPTION OF PROCEDURE:  The patient was brought to the operating room, placed on the table in supine position.  After smooth induction of general endotracheal anesthesia, she was placed in the three-pin head rest and the fluoroscopy unit was brought in for lateral fluoroscopic images of the skull. The patient then underwent the start of the procedure by Dr. Suzanna Obey, who performed a transnasal approach to the sphenoid sinus.  I became involved in the surgery when the anterior wall of the sphenoid sinus was opened.  Some of the mucosa was stripped on the posterior wall.  Some tumor could be seen pouching through the floor of the sella.  This was opened, first with a bipolar cautery, and then some small biopsies were obtained.  This revealed clear, fleshy-looking tumor that was brown and gelatinous.  The tumor was further resected after the floor of the sella was opened even further and ring curettes could be inserted to remove large portions of tumor.  The  dissection continued using ring curettes with an upgoing trajectory to remove tumor from above the sella.  Several Valsalva maneuvers were then employed to bring down further tumor, and ultimately some spinal fluid was retrieved from some tissue wall in the superior aspect that appeared to have a very arachnoid plane to it.  Once this was achieved, no further dissection was performed.  The lateral walls were dissected and some tumor was recovered from this area, but a good resection of the tumor had been obtained.  Minimal bleeding was obtained from the lateral tumor walls but because the spinal fluid leak persisted, it was felt that a Tisseel closure of the spinal fluid would be best obtained. Tisseel was then prepared and placed over the floor of the resection cavity in the region where the floor of the sella had been eroded.  A fat graft was then placed, and the procedure was turned over to Dr. Suzanna Obey for further closure and reconstruction of the nasal passages. DD:  09/08/00 TD:  09/09/00 Job: [id number]

## 2010-07-05 NOTE — H&P (Signed)
[place]. [facility]  Patient:    [name], [name]                   MRN: [id number] Adm. Date:  09/08/00 Attending:  Stefani Dama, M.D.                         History and Physical  ADMITTING DIAGNOSIS:  Pituitary tumor with visual loss.  HISTORY OF PRESENT ILLNESS:  The patient is a 76 year old, right-handed individual who has had progressive visual loss for a prolonged period of time, perhaps, as long as two years. She was noticing that she was having increasing difficulty with vision and was seen by Richarda Overlie, M.D. this past week. It was noted that her vision was down to 20/200. An MRI of the brain was ordered as there was some cupping noted in her fundi. The MRI demonstrates the presence of a large pituitary tumor. She was subsequently seen by Alfonse Alpers. Gegick, M.D., and preliminary evidence from endocrine workup suggests no endocrinopathy. It is felt that this represents a non-secreting pituitary tumor. I was advised of the patients problems last Friday. I had seen her for evaluation, and I suggested that she should have surgical extirpation via transsphenoidal approach for this tumor as the patients vision was failing significantly. She is now admitted to the hospital to undergo surgical extirpation of a pituitary tumor.  PAST MEDICAL HISTORY:  Reveals that the patient has some hypothyroidism. She also has some hypertension and hypercholesterolemia. Her other past medical history appears to be fairly benign. The patient generally has been quite healthy.  CURRENT MEDICATIONS: 1. She has currently been on Adalat 30 mg daily. 2. Synthroid 0.05 mg a day. 3. Lipitor 10 mg a day. 4. Dexamethasone 4 mg b.i.d. started last Friday. 5. Famotidine 20 mg b.i.d. started last Friday also.  ALLERGIES:  She notes no allergies to any medications.  FAMILY HISTORY:  Reports no significant medical problems.  REVIEW OF SYSTEMS:  Notable for  complaints of sinus headaches and notable for hypothyroidism but is negative on a 14-point review sheet otherwise.  PHYSICAL EXAMINATION:  At this time reveals:  HEENT:  Visual acuity in the right eye is 20/800. Visual acuity in the left eye is 20/800. There is severe bitemporal visual field loss. Her pupillary reactions are 4 mm bilaterally and briskly reactive. The extraocular movements are full, and the face is symmetric to grimace. Tongue and uvula are in the midline. Sclerae and conjunctivae are clear. Fundi show that the disks are flat bilaterally to my exam. Facial sensation is intact bilaterally. Facial expression is normal bilaterally. Tongue and uvula are in the midline. Sclerae and conjunctivae are clear.  NECK:  No masses. No bruits are heard.  HEART:  Regular rate and rhythm.  ABDOMEN:  Soft. Bowel sounds positive. No masses are palpable.  EXTREMITIES:  No clubbing, cyanosis, or edema.  NEUROLOGICAL:  Station and gait are normal. Deep tendon reflexes are 2+ in the patellae and the Achilles, 2+ in the biceps and triceps, 1+ in the brachioradialis.  IMPRESSION:  The patient has evidence of an essentially normal neurologic exam save for the fact that she is turning blind. She has 20/800 vision in each eye tested separately. Together her visual acuity improves to 20/200 with corrective lenses. We will be proceeding with transsphenoidal extirpation of this tumor at the current time. DD:  09/08/00 TD:  09/08/00 Job: [id number]

## 2010-07-05 NOTE — Op Note (Signed)
[place]. [facility]  Patient:    [name], [name]                   MRN: [id number] Proc. Date: 09/08/00 Attending:  Margit Banda. Jearld Fenton, M.D. CC:         Stefani Dama, M.D.  Richarda Overlie, M.D.  Alfonse Alpers. Dagoberto Ligas, M.D.   Operative Report  PREOPERATIVE DIAGNOSIS:  Pituitary tumor.  POSTOPERATIVE DIAGNOSIS:  Pituitary tumor.  PROCEDURE:  Transsphenoidal approach to pituitary hypophysectomy.  ANESTHESIA:  General endotracheal tube.  SURGEONS:  Stefani Dama, M.D., for the pituitary removal, and Margit Banda. Jearld Fenton, M.D., as the approach.  INDICATIONS:  A 76 year old who has had a large tumor with rapid progressive vision changes that have been identified as a pituitary tumor as the cause. This was done by MRI scan.  Dr. Danielle Dess now feels it is necessary to remove this tumor for saving the vision and asks for a transsphenoidal approach.  The patient was informed of the risks and benefits of the procedure, including bleeding, infection, septal perforation, change in external appearance of the nose including tip ptosis and saddle nose deformity, numbness of the teeth and tip of the nose, chronic sinusitis, chronic crusting and drying, and risk of the anesthetic.  All questions were answered, and consent was obtained.  DESCRIPTION OF PROCEDURE:  Patient taken to the operating room and placed in the supine position.  After adequate general endotracheal tube anesthesia and positioned in the neurosurgical fashion, she was prepped and draped in the usual sterile manner.  Oxymetazoline pledgets were placed into the nose bilaterally and the septum, columella, and anterior nose were injected with 1% lidocaine with 1:100,000 epinephrine.  An incision was then outlined in a gull-wing fashion across the base of the columella and cut with the 11 blade. The flap was then elevated and the medial crura were identified and detached from their base.  This flap was  then elevated superiorly and as it was dissected inside the nose.  The septum then was dissected on the right side, raising the flap with a Therapist, nutritional along the cartilage.  The septum was then taken off the nasal spine with a 4 mm osteotome, and the maxillary crest and the septum was pushed into the left side nasal cavity.  The bone of the septum was then removed.  A large piece was taken for closure of the sphenoid sinus.  The bone was then followed back to the sphenoid anterior face, and this was opened with the 4 mm osteotome.  The Kerrison was used to open the sphenoid widely, and the mucosa was removed from the sphenoid sinus.  The mid-sinus septum of bone was removed with the pituitary forceps.  The tumor could easily be seen bulging into the sphenoid sinus, and there was no bony covering over this.  The operation was then taken over by Dr. Barnett Abu, to be dictated as a separate operative report.  Closure was then performed by placing fat harvested from the right abdomen into the sphenoid sinus, and a piece of bone was then fashioned, tucked medial to the edge of the sphenoid sinus, keeping the fat in position.  The septal flaps were then positioned back into their anatomic position.  The crura were attached back into their anatomic position with 4-0 chromic and the spine was reattached with 4-0 nylon clear stitch.  The columella incision was then closed with deep sutures of 4-0 chromic and an  interrupted 6-0 nylon.  The medial portion of the skin was closed with interrupted 4-0 chromic.  The quilting plain gut stitch was then used to secure the medial crura as well as the septum with a mattress-type incision.  Also the mattress suture was placed through the septum to reattach the flaps.  Telfa roll soaked in bacitracin was then placed into the nose bilaterally and secured with a 3-0 nylon anteriorly.  The patient was then awakened, brought to recovery in stable condition,  counts correct. DD:  09/08/00 TD:  09/09/00 Job: [id number]

## 2010-07-05 NOTE — Discharge Summary (Signed)
[place]. [facility]  Patient:    [name], [name]                 MRN: [id number] Adm. Date:  [id number] Disc. Date: [id number] Attending:  Jonne Ply CC:         Alfonse Alpers. Dagoberto Ligas, M.D.  Margit Banda. Jearld Fenton, M.D.  Richarda Overlie, M.D.   Discharge Summary  ADMISSION DIAGNOSIS:  Pituitary tumor with progressive blindness.  DISCHARGE DIAGNOSIS:  Pituitary tumor with progressive blindness.  CONDITION ON DISCHARGE:  Improving.  CONSULTING PHYSICIANS:  Alfonse Alpers. Dagoberto Ligas, M.D., Margit Banda. Jearld Fenton, M.D., Richarda Overlie, M.D.  CONDITION ON DISCHARGE:  Improving.  HOSPITAL COURSE:  The patient is a 76 year old individual who has had some progressive visual loss.  An MRI in the past week demonstrated the presence of a large pituitary tumor and she had profound bitemporal hemianopsia with corrected vision of 20/100 in her left eye, somewhat better in her right eye. The patient was advised regarding surgical intervention and this was performed on September 08, 2000, via transphenoidal approach with Margit Banda. Jearld Fenton, M.D. performing the transphenoidal approach surgery and myself performing resection of the tumor.  The patient tolerated the surgery quite well.  Postoperatively she has had good visual function and now can read a 20/40 line with both eyes corrected.  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  Much of her left visual field appears to have improved since the time of surgery. Formal visual field testing will be performed down the road.  At the current time, the patient is not having any hormonal abnormalities.  She did have one episode of transient diabetes insipidous during the second postoperative evening.  Since that time she has been well controlled.  Overall, the patient is feeling quite well.  She feels improved.  Her nasal packings are still in place and these will be removed by Margit Banda. Jearld Fenton, M.D. as an outpatient.  Her electrolyte studies at the time of hospitalization were within normal limits save for a mildly decreased potassium which was corrected orally.  DISCHARGE MEDICATIONS: 1. Prednisone 10 mg b.i.d. and tapered over the next two weeks. 2. Percocet for severe pain, though, she has not had any pain medication    requirements during this hospitalization. DD:  09/12/00 TD:  09/14/00 Job: [id number]

## 2010-09-05 ENCOUNTER — Other Ambulatory Visit: Payer: Self-pay | Admitting: Dermatology

## 2010-11-19 LAB — BASIC METABOLIC PANEL
BUN: 14 mg/dL (ref 6–23)
BUN: 15 mg/dL (ref 6–23)
BUN: 16 mg/dL (ref 6–23)
CO2: 28 mEq/L (ref 19–32)
CO2: 30 mEq/L (ref 19–32)
CO2: 30 mEq/L (ref 19–32)
CO2: 31 mEq/L (ref 19–32)
Calcium: 10 mg/dL (ref 8.4–10.5)
Calcium: 8.5 mg/dL (ref 8.4–10.5)
Calcium: 8.5 mg/dL (ref 8.4–10.5)
Calcium: 8.9 mg/dL (ref 8.4–10.5)
Calcium: 9.2 mg/dL (ref 8.4–10.5)
Chloride: 100 mEq/L (ref 96–112)
Chloride: 100 mEq/L (ref 96–112)
Chloride: 102 mEq/L (ref 96–112)
Chloride: 103 mEq/L (ref 96–112)
Chloride: 103 mEq/L (ref 96–112)
Chloride: 97 mEq/L (ref 96–112)
Chloride: 99 mEq/L (ref 96–112)
Creatinine, Ser: 0.64 mg/dL (ref 0.4–1.2)
Creatinine, Ser: 0.67 mg/dL (ref 0.4–1.2)
Creatinine, Ser: 0.74 mg/dL (ref 0.4–1.2)
GFR calc Af Amer: >60 mL/min (ref >60)
GFR calc Af Amer: >60 mL/min (ref >60)
GFR calc Af Amer: >60 mL/min (ref >60)
GFR calc Af Amer: >60 mL/min (ref >60)
GFR calc non Af Amer: >60 mL/min (ref >60)
GFR calc non Af Amer: >60 mL/min (ref >60)
GFR calc non Af Amer: >60 mL/min (ref >60)
Glucose, Bld: 111 mg/dL — ABNORMAL HIGH (ref 70–99)
Glucose, Bld: 179 mg/dL — ABNORMAL HIGH (ref 70–99)
Glucose, Bld: 87 mg/dL (ref 70–99)
Glucose, Bld: 92 mg/dL (ref 70–99)
Potassium: 3 mEq/L — ABNORMAL LOW (ref 3.5–5.1)
Potassium: 4.5 mEq/L (ref 3.5–5.1)
Sodium: 137 mEq/L (ref 135–145)
Sodium: 137 mEq/L (ref 135–145)
Sodium: 139 mEq/L (ref 135–145)

## 2010-11-19 LAB — GLUCOSE, CAPILLARY
Glucose-Capillary: 108 mg/dL — ABNORMAL HIGH (ref 70–99)
Glucose-Capillary: 108 mg/dL — ABNORMAL HIGH (ref 70–99)
Glucose-Capillary: 113 mg/dL — ABNORMAL HIGH (ref 70–99)
Glucose-Capillary: 114 mg/dL — ABNORMAL HIGH (ref 70–99)
Glucose-Capillary: 127 mg/dL — ABNORMAL HIGH (ref 70–99)
Glucose-Capillary: 128 mg/dL — ABNORMAL HIGH (ref 70–99)
Glucose-Capillary: 129 mg/dL — ABNORMAL HIGH (ref 70–99)
Glucose-Capillary: 135 mg/dL — ABNORMAL HIGH (ref 70–99)
Glucose-Capillary: 139 mg/dL — ABNORMAL HIGH (ref 70–99)
Glucose-Capillary: 140 mg/dL — ABNORMAL HIGH (ref 70–99)
Glucose-Capillary: 151 mg/dL — ABNORMAL HIGH (ref 70–99)
Glucose-Capillary: 157 mg/dL — ABNORMAL HIGH (ref 70–99)
Glucose-Capillary: 160 mg/dL — ABNORMAL HIGH (ref 70–99)
Glucose-Capillary: 171 mg/dL — ABNORMAL HIGH (ref 70–99)
Glucose-Capillary: 177 mg/dL — ABNORMAL HIGH (ref 70–99)
Glucose-Capillary: 224 mg/dL — ABNORMAL HIGH (ref 70–99)
Glucose-Capillary: 94 mg/dL (ref 70–99)

## 2010-11-19 LAB — POCT I-STAT, CHEM 8
Glucose, Bld: 161 mg/dL — ABNORMAL HIGH (ref 70–99)
HCT: 31 % — ABNORMAL LOW (ref 36.0–46.0)
Hemoglobin: 10.5 g/dL — ABNORMAL LOW (ref 12.0–15.0)
Potassium: 3.5 mEq/L (ref 3.5–5.1)
Sodium: 137 mEq/L (ref 135–145)
TCO2: 26 mmol/L (ref 0–100)

## 2010-11-19 LAB — CBC
HCT: 33.3 % — ABNORMAL LOW (ref 36.0–46.0)
HCT: 33.6 % — ABNORMAL LOW (ref 36.0–46.0)
HCT: 34.2 % — ABNORMAL LOW (ref 36.0–46.0)
HCT: 34.9 % — ABNORMAL LOW (ref 36.0–46.0)
Hemoglobin: 10.8 g/dL — ABNORMAL LOW (ref 12.0–15.0)
Hemoglobin: 11.1 g/dL — ABNORMAL LOW (ref 12.0–15.0)
Hemoglobin: 9.3 g/dL — ABNORMAL LOW (ref 12.0–15.0)
Hemoglobin: 9.6 g/dL — ABNORMAL LOW (ref 12.0–15.0)
MCHC: 30.8 g/dL (ref 30.0–36.0)
MCHC: 30.8 g/dL (ref 30.0–36.0)
MCHC: 31.1 g/dL (ref 30.0–36.0)
MCHC: 31.6 g/dL (ref 30.0–36.0)
MCHC: 31.6 g/dL (ref 30.0–36.0)
MCHC: 31.6 g/dL (ref 30.0–36.0)
MCV: 66.9 fL — ABNORMAL LOW (ref 78.0–100.0)
MCV: 67.9 fL — ABNORMAL LOW (ref 78.0–100.0)
MCV: 70.8 fL — ABNORMAL LOW (ref 78.0–100.0)
MCV: 72 fL — ABNORMAL LOW (ref 78.0–100.0)
MCV: 72.2 fL — ABNORMAL LOW (ref 78.0–100.0)
MCV: 72.6 fL — ABNORMAL LOW (ref 78.0–100.0)
Platelets: 341 10*3/uL (ref 150–400)
Platelets: 400 10*3/uL (ref 150–400)
RBC: 4.47 MIL/uL (ref 3.87–5.11)
RBC: 4.71 MIL/uL (ref 3.87–5.11)
RBC: 4.93 MIL/uL (ref 3.87–5.11)
RDW: 21 % — ABNORMAL HIGH (ref 11.5–15.5)
RDW: 21.1 % — ABNORMAL HIGH (ref 11.5–15.5)
RDW: 23.3 % — ABNORMAL HIGH (ref 11.5–15.5)
RDW: 23.7 % — ABNORMAL HIGH (ref 11.5–15.5)
RDW: 24 % — ABNORMAL HIGH (ref 11.5–15.5)
RDW: 24.7 % — ABNORMAL HIGH (ref 11.5–15.5)
RDW: 24.9 % — ABNORMAL HIGH (ref 11.5–15.5)
WBC: 14.1 10*3/uL — ABNORMAL HIGH (ref 4.0–10.5)
WBC: 7.6 10*3/uL (ref 4.0–10.5)
WBC: 8.4 10*3/uL (ref 4.0–10.5)

## 2010-11-19 LAB — TYPE AND SCREEN

## 2010-11-19 LAB — ABO/RH: ABO/RH(D): O POS

## 2010-11-19 LAB — COMPREHENSIVE METABOLIC PANEL
BUN: 17 mg/dL (ref 6–23)
CO2: 29 mEq/L (ref 19–32)
Chloride: 102 mEq/L (ref 96–112)
GFR calc Af Amer: >60 mL/min (ref >60)
GFR calc non Af Amer: >60 mL/min (ref >60)
Glucose, Bld: 81 mg/dL (ref 70–99)
Potassium: 5.3 mEq/L — ABNORMAL HIGH (ref 3.5–5.1)

## 2010-11-19 LAB — POCT I-STAT 4, (NA,K, GLUC, HGB,HCT)
Glucose, Bld: 134 mg/dL — ABNORMAL HIGH (ref 70–99)
HCT: 25 % — ABNORMAL LOW (ref 36.0–46.0)
Hemoglobin: 8.5 g/dL — ABNORMAL LOW (ref 12.0–15.0)

## 2010-11-19 LAB — VITAMIN B12: Vitamin B-12: 271 pg/mL (ref 211–911)

## 2010-11-19 LAB — IRON AND TIBC: Saturation Ratios: 4 % — ABNORMAL LOW (ref 20–55)

## 2010-11-21 LAB — GLUCOSE, CAPILLARY
Glucose-Capillary: 100 mg/dL — ABNORMAL HIGH (ref 70–99)
Glucose-Capillary: 106 mg/dL — ABNORMAL HIGH (ref 70–99)
Glucose-Capillary: 111 mg/dL — ABNORMAL HIGH (ref 70–99)
Glucose-Capillary: 119 mg/dL — ABNORMAL HIGH (ref 70–99)
Glucose-Capillary: 97 mg/dL (ref 70–99)

## 2010-12-10 ENCOUNTER — Other Ambulatory Visit: Payer: Self-pay | Admitting: Dermatology

## 2011-01-28 ENCOUNTER — Encounter: Payer: Self-pay | Admitting: Internal Medicine

## 2011-01-29 ENCOUNTER — Ambulatory Visit (INDEPENDENT_AMBULATORY_CARE_PROVIDER_SITE_OTHER)
Admission: RE | Admit: 2011-01-29 | Discharge: 2011-01-29 | Disposition: A | Discharge status: Home / Self Care | Payer: Medicare Other | Source: Ambulatory Visit | Attending: Internal Medicine | Admitting: Internal Medicine

## 2011-01-29 ENCOUNTER — Ambulatory Visit (INDEPENDENT_AMBULATORY_CARE_PROVIDER_SITE_OTHER): Payer: Medicare Other | Admitting: Internal Medicine

## 2011-01-29 ENCOUNTER — Encounter: Payer: Self-pay | Admitting: Internal Medicine

## 2011-01-29 VITALS — BP 142/70 | HR 79 | Ht 63.0 in | Wt 194.0 lb

## 2011-01-29 DIAGNOSIS — G4730 Sleep apnea, unspecified: Secondary | ICD-10-CM

## 2011-01-29 DIAGNOSIS — R0600 Dyspnea, unspecified: Secondary | ICD-10-CM

## 2011-01-29 DIAGNOSIS — R0989 Other specified symptoms and signs involving the circulatory and respiratory systems: Secondary | ICD-10-CM

## 2011-01-29 DIAGNOSIS — R0609 Other forms of dyspnea: Secondary | ICD-10-CM

## 2011-01-29 DIAGNOSIS — J449 Chronic obstructive pulmonary disease, unspecified: Secondary | ICD-10-CM

## 2011-01-29 NOTE — Patient Instructions (Signed)
Order- CXR   Dyspnea              Schedule 6 MWT on room air               Spirometry  Ask [employer] again  For information about portable oxygen concentrators for travel- can they be rented. For a trip to [place] you would need to know what to do about the different line voltages used in [place]- would you need a transformer?

## 2011-01-29 NOTE — Progress Notes (Signed)
01/29/11- 76 yoF former smoker followed for obstructive sleep apnea, allergic rhinitis, hypoxia during sleep complicated by heart murmur LOV- 01/29/2010 She uses CPAP 15/ [employer] all night every night and says she sleeps well unless the mask is bothering her. She uses oxygen 2L every night when at home but leaves it at home when she travels. More short of breath with brisk walking over the last 3-4 months, gradually noticed without sudden event. She denies cough, wheeze, chest pain or swelling.  Office spirometry 01/29/11- FVC 2.33/ 87%, FEV1 1.69/ 84%, FEV1/FVC 0.72, FEF25-75 64%  ROS-see HPI Constitutional:   No-   weight loss, night sweats, fevers, chills, fatigue, lassitude. HEENT:   No-  headaches, difficulty swallowing, tooth/dental problems, sore throat,       No-  sneezing, itching, ear ache, nasal congestion, post nasal drip,  CV:  No-   chest pain, orthopnea, PND, swelling in lower extremities, anasarca,                                  dizziness, palpitations Resp: + shortness of breath with exertion, not at rest.              No-   productive cough,  No non-productive cough,  No- coughing up of blood.              No-   change in color of mucus.  No- wheezing.   Skin: No-   rash or lesions. GI:  No-   heartburn, indigestion, abdominal pain, nausea, vomiting, diarrhea,                 change in bowel habits, loss of appetite GU: MS: Neuro-    Psych:   OBL General- Alert, Oriented, Affect-appropriate, Distress- none acute, obese Skin- rash-none, lesions- none, excoriation- none Lymphadenopathy- none Head- atraumatic            Eyes- Gross vision intact, PERRLA, conjunctivae clear secretions            Ears- Hearing, canals-normal            Nose- Clear, no-Septal dev, mucus, polyps, erosion, perforation             Throat- Mallampati II , mucosa clear , drainage- none, tonsils- atrophic Neck- flexible , trachea midline, no stridor , thyroid nl, carotid no bruit Chest -  symmetrical excursion , unlabored           Heart/CV- RRR , 2/6 systolic murmur AS , no gallop  , no rub, nl s1 s2                           - JVD- none , edema- 1-2+, stasis changes- none, varices- none           Lung- clear to P&A, unlabored, wheeze- none, cough- none , dullness-none, rub- none           Chest wall-  Abd- tender-no, distended-no, bowel sounds-present, HSM- no Br/ Gen/ Rectal- Not done, not indicated Extrem- cyanosis- none, clubbing, none, atrophy- none, strength- nl Neuro- grossly intact to observation

## 2011-01-30 ENCOUNTER — Other Ambulatory Visit: Payer: Self-pay | Admitting: Internal Medicine

## 2011-01-30 DIAGNOSIS — Z1231 Encounter for screening mammogram for malignant neoplasm of breast: Secondary | ICD-10-CM

## 2011-02-02 DIAGNOSIS — J449 Chronic obstructive pulmonary disease, unspecified: Secondary | ICD-10-CM | POA: Insufficient documentation

## 2011-02-02 NOTE — Assessment & Plan Note (Signed)
Dyspnea is likely multifactorial, reflecting mild COPD with known exertional oxygen desaturation, obesity, and uncertain impact of valvular heart disease. Her murmur sounds like aortic stenosis. Plan-she will talk again with her DME company about portable oxygen for travel. Update chest x-ray and 6 minute walk test.

## 2011-02-02 NOTE — Assessment & Plan Note (Addendum)
Good CPAP compliance and control. 

## 2011-02-26 ENCOUNTER — Ambulatory Visit
Admission: RE | Admit: 2011-02-26 | Discharge: 2011-02-26 | Disposition: A | Discharge status: Home / Self Care | Payer: Medicare Other | Source: Ambulatory Visit | Attending: Internal Medicine | Admitting: Internal Medicine

## 2011-02-26 DIAGNOSIS — Z1231 Encounter for screening mammogram for malignant neoplasm of breast: Secondary | ICD-10-CM

## 2011-03-04 ENCOUNTER — Ambulatory Visit: Payer: Medicare Other

## 2011-03-04 ENCOUNTER — Ambulatory Visit: Payer: Medicare Other | Admitting: Internal Medicine

## 2011-03-14 ENCOUNTER — Ambulatory Visit (INDEPENDENT_AMBULATORY_CARE_PROVIDER_SITE_OTHER): Payer: Medicare Other | Admitting: Internal Medicine

## 2011-03-14 ENCOUNTER — Encounter: Payer: Self-pay | Admitting: Internal Medicine

## 2011-03-14 ENCOUNTER — Other Ambulatory Visit (INDEPENDENT_AMBULATORY_CARE_PROVIDER_SITE_OTHER): Payer: Medicare Other

## 2011-03-14 VITALS — BP 128/68 | HR 82 | Ht 63.0 in | Wt 190.0 lb

## 2011-03-14 DIAGNOSIS — R0600 Dyspnea, unspecified: Secondary | ICD-10-CM

## 2011-03-14 DIAGNOSIS — R0602 Shortness of breath: Secondary | ICD-10-CM

## 2011-03-14 DIAGNOSIS — I359 Nonrheumatic aortic valve disorder, unspecified: Secondary | ICD-10-CM

## 2011-03-14 DIAGNOSIS — J449 Chronic obstructive pulmonary disease, unspecified: Secondary | ICD-10-CM

## 2011-03-14 DIAGNOSIS — R0989 Other specified symptoms and signs involving the circulatory and respiratory systems: Secondary | ICD-10-CM

## 2011-03-14 DIAGNOSIS — G4730 Sleep apnea, unspecified: Secondary | ICD-10-CM

## 2011-03-14 LAB — CBC WITH DIFFERENTIAL/PLATELET
Basophils Relative: 0.8 % (ref 0.0–3.0)
HCT: 39.9 % (ref 36.0–46.0)
Hemoglobin: 13.5 g/dL (ref 12.0–15.0)
Lymphocytes Relative: 26.9 % (ref 12.0–46.0)
Lymphs Abs: 2.1 10*3/uL (ref 0.7–4.0)
MCHC: 33.9 g/dL (ref 30.0–36.0)
Monocytes Relative: 14.3 % — ABNORMAL HIGH (ref 3.0–12.0)
Neutro Abs: 4.1 10*3/uL (ref 1.4–7.7)
RBC: 4.55 Mil/uL (ref 3.87–5.11)

## 2011-03-14 NOTE — Progress Notes (Signed)
01/29/11- [name] former smoker followed for obstructive sleep apnea, allergic rhinitis, hypoxia during sleep complicated by heart murmur LOV- 01/29/2010 She uses CPAP 15/ [employer] all night every night and says she sleeps well unless the mask is bothering her. She uses oxygen 2L every night when at home but leaves it at home when she travels. More short of breath with brisk walking over the last 3-4 months, gradually noticed without sudden event. She denies cough, wheeze, chest pain or swelling. Office spirometry 01/29/11- FVC 2.33/ 87%, FEV1 1.69/ 84%, FEV1/FVC 0.72, FEF25-75 64%  03/14/11- [name] former smoker followed for obstructive sleep apnea, allergic rhinitis, hypoxia during sleep complicated by heart murmur She has not noticed any change in her shortness of breath. Dyspnea on exertion with one flight of stairs but she can keep walking when she reaches the top. Little change from one day to the next or with weather changes. Prior echocardiogram had shown moderate aortic stenosis. 6 minute walk test 03/14/2011-started at 97%, fell to 93% by the end of her walk, rebounded to 96% after 2 minutes rest on room air. 288 m. Complaints of hip and knee pain. Office spirometry 03/31/2010-FEV1/FVC 84% indicating little obstructive airways disease. Chest x-ray-01/30/2011-no acute process, borderline cardiac enlargement. Old vertebral compression fracture and surgical repair.  ROS-see HPI Constitutional:   No-   weight loss, night sweats, fevers, chills, fatigue, lassitude. HEENT:   No-  headaches, difficulty swallowing, tooth/dental problems, sore throat,       No-  sneezing, itching, ear ache, nasal congestion, post nasal drip,  CV:  No-   chest pain, orthopnea, PND, swelling in lower extremities, anasarca, dizziness, palpitations Resp: + shortness of breath with exertion, not at rest.              No-   productive cough,  No non-productive cough,  No- coughing up of blood.              No-   change in  color of mucus.  No- wheezing.   Skin: No-   rash or lesions. GI:  No-   heartburn, indigestion, abdominal pain, nausea, vomiting, diarrhea,                 change in bowel habits, loss of appetite GU: MS: Neuro-    Psych:   OBJ General- Alert, Oriented, Affect-appropriate, Distress- none acute, obese Skin- rash-none, lesions- none, excoriation- none Lymphadenopathy- none Head- atraumatic            Eyes- Gross vision intact, PERRLA, conjunctivae clear secretions            Ears- Hearing, canals-normal            Nose- Clear, no-Septal dev, mucus, polyps, erosion, perforation             Throat- Mallampati II , mucosa clear , drainage- none, tonsils- atrophic Neck- flexible , trachea midline, no stridor , thyroid nl, carotid no bruit Chest - symmetrical excursion , unlabored           Heart/CV- RRR , 2/6 systolic murmur AS , no gallop  , no rub, nl s1 s2                           - JVD- none , edema- 2-3+chronic, stasis changes- none, varices- none           Lung- clear to P&A, unlabored, wheeze- none, cough- none , dullness-none, rub- none  Chest wall-  Abd- Br/ Gen/ Rectal- Not done, not indicated Extrem- cyanosis- none, clubbing, none, atrophy- none, strength- nl Neuro- grossly intact to observation

## 2011-03-14 NOTE — Patient Instructions (Signed)
Order- CBC   Dx dyspnea  Order- schedule PFT  Dx dyspnea  Walking or any other physical activity is good for helping to regain some stamina.

## 2011-03-16 NOTE — Assessment & Plan Note (Signed)
Good ongoing compliance and control with CPAP.

## 2011-03-16 NOTE — Assessment & Plan Note (Signed)
Only mild reversible obstructive airways disease. This is better considered chronic obstructive asthma. She remains oxygen dependent at night with her CPAP, probably because of obesity hypoventilation. Aortic stenosis probably contributes to her dyspnea.

## 2011-03-16 NOTE — Assessment & Plan Note (Signed)
She is obese and deconditioned, but pulmonary function tests are not severely abnormal and she does not desaturate badly with exertion. I don't think we should discount a component of her cardiac valvular disease contributing to her exertional dyspnea.

## 2011-03-19 ENCOUNTER — Encounter: Payer: Self-pay | Admitting: Cardiology

## 2011-03-19 ENCOUNTER — Ambulatory Visit (INDEPENDENT_AMBULATORY_CARE_PROVIDER_SITE_OTHER): Payer: Medicare Other | Admitting: Cardiology

## 2011-03-19 DIAGNOSIS — R0600 Dyspnea, unspecified: Secondary | ICD-10-CM

## 2011-03-19 DIAGNOSIS — R0609 Other forms of dyspnea: Secondary | ICD-10-CM

## 2011-03-19 DIAGNOSIS — I359 Nonrheumatic aortic valve disorder, unspecified: Secondary | ICD-10-CM

## 2011-03-19 DIAGNOSIS — I10 Essential (primary) hypertension: Secondary | ICD-10-CM

## 2011-03-19 DIAGNOSIS — R0602 Shortness of breath: Secondary | ICD-10-CM

## 2011-03-19 DIAGNOSIS — I350 Nonrheumatic aortic (valve) stenosis: Secondary | ICD-10-CM

## 2011-03-19 DIAGNOSIS — R609 Edema, unspecified: Secondary | ICD-10-CM | POA: Insufficient documentation

## 2011-03-19 NOTE — Progress Notes (Signed)
HPI The patient presents for followup of dyspnea. She's had some mild aortic stenosis. Her last echocardiogram demonstrated some asymmetric left ventricular hypertrophy as well. However, a BNP level was normal. I felt it unlikely that they'll disease or diastolic dysfunction was contributing to her dyspnea. She has continued to follow with Dr. Maple Hudson. She's had only mildly abnormal pulmonary function testing. 6 minute walk test didn't demonstrate very significant desaturation. He wondered again whether her cardiac pathology could be contributing to her complaints of dyspnea. She thinks her breathing is relatively unchanged from previous. Perhaps it is slightly worse. She will get short of breath walking upper flight of stairs. She's not describing new PND or orthopnea. She's not describing new palpitations, presyncope or syncope. She does have chronic lower extremity swelling which is worse at the end of the day. She says she would be more comfortable if this was gone. She does say she watches her salt.  Allergies  Allergen Reactions  . Valacyclovir Hcl     Current Outpatient Prescriptions  Medication Sig Dispense Refill  . amLODipine (NORVASC) 10 MG tablet Take 15 mg by mouth daily.        Marland Kitchen aspirin 81 MG tablet Take 81 mg by mouth daily.        Marland Kitchen atorvastatin (LIPITOR) 80 MG tablet Take 80 mg by mouth daily.        . Calcium Carbonate-Vit D-Min (CALCIUM 1200 PO) Take 1 capsule by mouth daily.       . cetirizine (ZYRTEC) 10 MG tablet Take 10 mg by mouth daily.        Marland Kitchen etodolac (LODINE) 500 MG tablet Take 1 tablet by mouth Twice daily.      Marland Kitchen gabapentin (NEURONTIN) 600 MG tablet Take 600 mg by mouth daily.        Marland Kitchen levothyroxine (SYNTHROID, LEVOTHROID) 25 MCG tablet Take 25 mcg by mouth daily.        Marland Kitchen losartan (COZAAR) 50 MG tablet Take 1 tablet by mouth daily.      . potassium chloride (KLOR-CON) 10 MEQ CR tablet Take 10 mEq by mouth daily.        . sitaGLIPtan-metformin (JANUMET) 50-1000  MG per tablet Take 1 tablet by mouth daily.          Past Medical History  Diagnosis Date  . Diabetes mellitus   . Hyperlipidemia   . Hypertension   . Sleep apnea     AHI 89/hr  . Allergic rhinitis   . COPD (chronic obstructive pulmonary disease)     PFT 08/08/09-FEV1 1.98/107; R 0.77; small airway obst w/tresp to dilator;DLCO 44%  . Fibromuscular dysplasia     RAS    Past Surgical History  Procedure Date  . Pituitary tumor removed     x 2  . Back surgery 2005  . Tonsillectomy   . Nasal septal deviation   . Bilateral renal bypass     ROS:  Left shoulder pain.  Otherwise, as stated in the HPI and negative for all other systems.  PHYSICAL EXAM BP 150/80  Pulse 69  Ht 5\' 3"  (1.6 m)  Wt 190 lb (86.183 kg)  BMI 33.66 kg/m2 GENERAL:  Well appearing HEENT:  Pupils equal round and reactive, fundi not visualized, oral mucosa unremarkable NECK:  No jugular venous distention, waveform within normal limits, carotid upstroke brisk and symmetric, no bruits, no thyromegaly LYMPHATICS:  No cervical, inguinal adenopathy LUNGS:  Clear to auscultation bilaterally BACK:  No CVA tenderness  CHEST:  Unremarkable HEART:  PMI not displaced or sustained,S1 and S2 within normal limits, no S3, no S4, no clicks, no rubs, apical systolic murmur radiating out the outflow tract and mid peaking ABD:  Flat, positive bowel sounds normal in frequency in pitch, no bruits, no rebound, no guarding, no midline pulsatile mass, no hepatomegaly, no splenomegaly EXT:  2 plus pulses throughout, moderate bilateral lower extremity edema with venous stasis changes, no cyanosis no clubbing SKIN:  No rashes no nodules NEURO:  Cranial nerves II through XII grossly intact, motor grossly intact throughout PSYCH:  Cognitively intact, oriented to person place and time  EKG:  Sinus rhythm, rate 69, axis within normal limits, intervals within normal limits, no acute ST-T wave changes. 03/19/2011   ASSESSMENT AND PLAN

## 2011-03-19 NOTE — Assessment & Plan Note (Signed)
I suspect some element of venous insufficiency. We will assess pulmonary pressures at the time of her echo. We discussed conservative therapies and I did give her a prescription for knee-high compression stockings.

## 2011-03-19 NOTE — Assessment & Plan Note (Addendum)
I doubt that this is contributing significantly to her dyspnea. However, I will repeat an echocardiogram and a BNP level.

## 2011-03-19 NOTE — Patient Instructions (Signed)
Please have blood work today (BNP)  Your physician has requested that you have an echocardiogram. Echocardiography is a painless test that uses sound waves to create images of your heart. It provides your doctor with information about the size and shape of your heart and how well your heart's chambers and valves are working. This procedure takes approximately one hour. There are no restrictions for this procedure.  The current medical regimen is effective;  continue present plan and medications.  Wear knee high compression stockings daily, remove at bedtime.  Follow up in 1 year with Dr Antoine Poche.  You will receive a letter in the mail 2 months before you are due.  Please call us when you receive this letter to schedule your follow up appointment.

## 2011-03-19 NOTE — Assessment & Plan Note (Signed)
I suspect that this is multifactorial with some component of deconditioning and weight as well as some chronic lung disease. He will be assessed as above.

## 2011-03-19 NOTE — Progress Notes (Signed)
Quick Note:  LMTCB ______ 

## 2011-03-19 NOTE — Assessment & Plan Note (Signed)
Blood pressure is slightly elevated. She will continue the meds as listed. She should keep a blood pressure diary and I would be happy to review this to suggest further med changes.

## 2011-03-27 ENCOUNTER — Ambulatory Visit (HOSPITAL_COMMUNITY): Payer: Medicare Other | Attending: Cardiovascular Disease | Admitting: Radiology

## 2011-03-27 DIAGNOSIS — I10 Essential (primary) hypertension: Secondary | ICD-10-CM | POA: Insufficient documentation

## 2011-03-27 DIAGNOSIS — R609 Edema, unspecified: Secondary | ICD-10-CM | POA: Insufficient documentation

## 2011-03-27 DIAGNOSIS — I7789 Other specified disorders of arteries and arterioles: Secondary | ICD-10-CM | POA: Insufficient documentation

## 2011-03-27 DIAGNOSIS — R0609 Other forms of dyspnea: Secondary | ICD-10-CM | POA: Insufficient documentation

## 2011-03-27 DIAGNOSIS — I359 Nonrheumatic aortic valve disorder, unspecified: Secondary | ICD-10-CM | POA: Insufficient documentation

## 2011-03-27 DIAGNOSIS — G4730 Sleep apnea, unspecified: Secondary | ICD-10-CM | POA: Insufficient documentation

## 2011-03-27 DIAGNOSIS — I350 Nonrheumatic aortic (valve) stenosis: Secondary | ICD-10-CM

## 2011-03-27 DIAGNOSIS — R0600 Dyspnea, unspecified: Secondary | ICD-10-CM

## 2011-03-27 DIAGNOSIS — E785 Hyperlipidemia, unspecified: Secondary | ICD-10-CM | POA: Insufficient documentation

## 2011-03-27 DIAGNOSIS — J449 Chronic obstructive pulmonary disease, unspecified: Secondary | ICD-10-CM | POA: Insufficient documentation

## 2011-03-27 DIAGNOSIS — J4489 Other specified chronic obstructive pulmonary disease: Secondary | ICD-10-CM | POA: Insufficient documentation

## 2011-03-27 DIAGNOSIS — E119 Type 2 diabetes mellitus without complications: Secondary | ICD-10-CM | POA: Insufficient documentation

## 2011-03-27 DIAGNOSIS — R0989 Other specified symptoms and signs involving the circulatory and respiratory systems: Secondary | ICD-10-CM | POA: Insufficient documentation

## 2011-04-18 ENCOUNTER — Ambulatory Visit: Payer: Medicare Other | Admitting: Internal Medicine

## 2011-04-22 ENCOUNTER — Ambulatory Visit: Payer: Medicare Other | Admitting: Cardiology

## 2011-05-09 ENCOUNTER — Ambulatory Visit (INDEPENDENT_AMBULATORY_CARE_PROVIDER_SITE_OTHER): Payer: Medicare Other | Admitting: Cardiology

## 2011-05-09 ENCOUNTER — Encounter: Payer: Self-pay | Admitting: Cardiology

## 2011-05-09 VITALS — BP 119/70 | HR 70 | Ht 63.0 in | Wt 186.0 lb

## 2011-05-09 DIAGNOSIS — I359 Nonrheumatic aortic valve disorder, unspecified: Secondary | ICD-10-CM

## 2011-05-09 DIAGNOSIS — R0602 Shortness of breath: Secondary | ICD-10-CM

## 2011-05-09 DIAGNOSIS — I10 Essential (primary) hypertension: Secondary | ICD-10-CM

## 2011-05-09 NOTE — Progress Notes (Signed)
HPI The patient presents for followup of dyspnea. She's had some aortic stenosis.  She has continued to follow with Dr. Maple Hudson. She's had only mildly abnormal pulmonary function testing. 6 minute walk test didn't demonstrate very significant desaturation. He wondered again whether her cardiac pathology could be contributing to her complaints of dyspnea. I did schedule her for an echo which demonstrated that her aortic stenosis has progressed and is severe with a mean gradient of 38. However, it is not critical aortic stenosis. Her BNP level has been normal. I brought her back to discuss this today.  She continues to say that her shortness of breath is worse and limiting her quality-of-life. She reports that she is dyspneic at the top of a flight of stairs in her home. She's not describing PND or orthopnea. She does have occasional chest pressure that she notices after she sits and relaxes. However, this is not reproducible with all activity. She's not describing palpitations, presyncope or syncope. She has had no new weight gain or edema. She does sleep with CPAP and oxygen.  Allergies  Allergen Reactions  . Valacyclovir Hcl     Current Outpatient Prescriptions  Medication Sig Dispense Refill  . amLODipine (NORVASC) 10 MG tablet Take 15 mg by mouth daily.        Marland Kitchen aspirin 81 MG tablet Take 81 mg by mouth daily.        Marland Kitchen atorvastatin (LIPITOR) 80 MG tablet Take 80 mg by mouth daily.        . Calcium Carbonate-Vit D-Min (CALCIUM 1200 PO) Take 1 capsule by mouth daily.       . cetirizine (ZYRTEC) 10 MG tablet Take 10 mg by mouth daily.        Marland Kitchen etodolac (LODINE) 500 MG tablet Take 1 tablet by mouth Twice daily.      Marland Kitchen gabapentin (NEURONTIN) 600 MG tablet Take 600 mg by mouth daily.        Marland Kitchen levothyroxine (SYNTHROID, LEVOTHROID) 25 MCG tablet Take 25 mcg by mouth daily.        Marland Kitchen losartan (COZAAR) 50 MG tablet Take 1 tablet by mouth daily.      . potassium chloride (KLOR-CON) 10 MEQ CR tablet Take  10 mEq by mouth daily.        . sitaGLIPtan-metformin (JANUMET) 50-1000 MG per tablet Take 1 tablet by mouth daily.          Past Medical History  Diagnosis Date  . Diabetes mellitus   . Hyperlipidemia   . Hypertension   . Sleep apnea     AHI 89/hr  . Allergic rhinitis   . COPD (chronic obstructive pulmonary disease)     PFT 08/08/09-FEV1 1.98/107; R 0.77; small airway obst w/tresp to dilator;DLCO 44%  . Fibromuscular dysplasia     RAS    Past Surgical History  Procedure Date  . Pituitary tumor removed     x 2  . Back surgery 2005  . Tonsillectomy   . Nasal septal deviation   . Bilateral renal bypass    ROS: As stated in the HPI and negative for all other systems.  PHYSICAL EXAM BP 119/70  Pulse 70  Ht 5\' 3"  (1.6 m)  Wt 186 lb (84.369 kg)  BMI 32.95 kg/m2 GENERAL:  Well appearing HEENT:  Pupils equal round and reactive, fundi not visualized, oral mucosa unremarkable NECK:  No jugular venous distention, waveform within normal limits, carotid upstroke brisk and symmetric, no bruits, transmitted systolic murmur,  no thyromegaly LYMPHATICS:  No cervical, inguinal adenopathy LUNGS:  Clear to auscultation bilaterally BACK:  No CVA tenderness CHEST:  Unremarkable HEART:  PMI not displaced or sustained,S1 and S2 within normal limits, no S3, no S4, no clicks, no rubs, apical systolic murmur radiating out the outflow tract and mid peaking ABD:  Flat, positive bowel sounds normal in frequency in pitch, no bruits, no rebound, no guarding, no midline pulsatile mass, no hepatomegaly, no splenomegaly EXT:  2 plus pulses throughout, moderate bilateral lower extremity edema with venous stasis changes, no cyanosis no clubbing SKIN:  No rashes no nodules NEURO:  Cranial nerves II through XII grossly intact, motor grossly intact throughout PSYCH:  Cognitively intact, oriented to person place and time  ASSESSMENT AND PLAN

## 2011-05-09 NOTE — Assessment & Plan Note (Signed)
AS above

## 2011-05-09 NOTE — Assessment & Plan Note (Signed)
The blood pressure is at target. No change in medications is indicated. We will continue with therapeutic lifestyle changes (TLC).  

## 2011-05-09 NOTE — Patient Instructions (Signed)
The current medical regimen is effective;  continue present plan and medications.  Follow up in 3 months with Dr Hochrein. 

## 2011-05-09 NOTE — Assessment & Plan Note (Addendum)
At this point she's has PFTs scheduled for next week. I will wait for these results and discussion with Dr. Maple Hudson. If he still feels that her symptoms are out of proportion to any fixed pulmonary disease I will plan right and left heart catheterization and I have discussed this with the patient.

## 2011-05-13 ENCOUNTER — Ambulatory Visit (INDEPENDENT_AMBULATORY_CARE_PROVIDER_SITE_OTHER): Payer: Medicare Other | Admitting: Internal Medicine

## 2011-05-13 ENCOUNTER — Encounter: Payer: Self-pay | Admitting: Internal Medicine

## 2011-05-13 VITALS — BP 132/78 | HR 73 | Ht 63.0 in | Wt 187.0 lb

## 2011-05-13 DIAGNOSIS — I10 Essential (primary) hypertension: Secondary | ICD-10-CM

## 2011-05-13 DIAGNOSIS — J309 Allergic rhinitis, unspecified: Secondary | ICD-10-CM

## 2011-05-13 DIAGNOSIS — J849 Interstitial pulmonary disease, unspecified: Secondary | ICD-10-CM

## 2011-05-13 DIAGNOSIS — R0602 Shortness of breath: Secondary | ICD-10-CM

## 2011-05-13 DIAGNOSIS — G4730 Sleep apnea, unspecified: Secondary | ICD-10-CM

## 2011-05-13 DIAGNOSIS — J8410 Pulmonary fibrosis, unspecified: Secondary | ICD-10-CM

## 2011-05-13 DIAGNOSIS — J449 Chronic obstructive pulmonary disease, unspecified: Secondary | ICD-10-CM

## 2011-05-13 LAB — PULMONARY FUNCTION TEST

## 2011-05-13 NOTE — Progress Notes (Signed)
PFT done today. 

## 2011-05-13 NOTE — Progress Notes (Signed)
01/29/11- 76 yoF former smoker followed for obstructive sleep apnea, allergic rhinitis, hypoxia during sleep complicated by heart murmur LOV- 01/29/2010 She uses CPAP 15/ [employer] all night every night and says she sleeps well unless the mask is bothering her. She uses oxygen 2L every night when at home but leaves it at home when she travels. More short of breath with brisk walking over the last 3-4 months, gradually noticed without sudden event. She denies cough, wheeze, chest pain or swelling. Office spirometry 01/29/11- FVC 2.33/ 87%, FEV1 1.69/ 84%, FEV1/FVC 0.72, FEF25-75 64%  03/14/11- 76 yoF former smoker followed for obstructive sleep apnea, allergic rhinitis, hypoxia during sleep complicated by heart murmur She has not noticed any change in her shortness of breath. Dyspnea on exertion with one flight of stairs but she can keep walking when she reaches the top. Little change from one day to the next or with weather changes. Prior echocardiogram had shown moderate aortic stenosis. 6 minute walk test 03/14/2011-started at 97%, fell to 93% by the end of her walk, rebounded to 96% after 2 minutes rest on room air. 288 m. Complaints of hip and knee pain. Office spirometry 03/31/2010-FEV1/FVC 84% indicating little obstructive airways disease. Chest x-ray-01/30/2011-no acute process, borderline cardiac enlargement. Old vertebral compression fracture and surgical repair.  05/13/11- 32 yoF former smoker followed for obstructive sleep apnea, allergic rhinitis, hypoxia during sleep complicated by heart murmur   Increased sneeze with pollen season but no cough or wheeze. Short of breath is mainly dyspnea on exertion. She paces herself and stops frequently during activities of daily living such as housecleaning.  Sister died of lung cancer last year.  She would like to change her primary physician to somebody in the [employer] group, to keep all in same system.  Dr. Hochrein/Cardiology had contacted me for  feedback once PFTs were done , weighing importance of cardiac disease to her dyspnea. CXR- 01/30/2011-stable , no active disease. Old vertebral compression fracture. PFT- 05/13/11- Mild obstructive disease in small airways, with response to bronchodilator, hyper inflation and airtrapping, reduced DLCO.  FEV1 1.87/104% , FEV1/FVC 0.77 , FEF 25-75% 1.60/77% with 21 % improvement in small airway flows after bronchodilator.   DLCO 47%    ROS-see HPI Constitutional:   No-   weight loss, night sweats, fevers, chills, fatigue, lassitude. HEENT:   No-  headaches, difficulty swallowing, tooth/dental problems, sore throat,       No-  sneezing, itching, ear ache, nasal congestion, post nasal drip,  CV:  No-   chest pain, orthopnea, PND,  +swelling in lower extremities, anasarca, dizziness, palpitations Resp: + shortness of breath with exertion, not at rest.              No-   productive cough,  No non-productive cough,  No- coughing up of blood.              No-   change in color of mucus.  No- wheezing.   Skin: No-   rash or lesions. GI:  No-   heartburn, indigestion, abdominal pain, nausea, vomiting, GU: MS: Neuro-   No acute change.  Psych:  Denies anxiety or depression.  OBJ General- Alert, Oriented, Affect-appropriate, Distress- none acute, obese Skin- rash-none, lesions- none, excoriation- none Lymphadenopathy- none Head- atraumatic            Eyes- Gross vision intact, PERRLA, conjunctivae clear secretions            Ears- Hearing, canals-normal  Nose- Clear, no-Septal dev, mucus, polyps, erosion, perforation             Throat- Mallampati II , mucosa clear , drainage- none, tonsils- atrophic Neck- flexible , trachea midline, no stridor , thyroid nl, carotid no bruit Chest - symmetrical excursion , unlabored           Heart/CV- RRR , 2/6 systolic murmur AS , no gallop  , no rub, nl s1 s2. Difficult to palpate radial pulses. Slow nail-bed capillary refill.                            - JVD- none , edema- 2-3+chronic, stasis changes- none, varices- none           Lung- Crackles lower 1/2 bilaterally , dullness-none, rub- none           Chest wall-  Abd- Br/ Gen/ Rectal- Not done, not indicated Extrem- cyanosis- none, clubbing, none, atrophy- none, strength- nl Neuro- grossly intact to observation

## 2011-05-13 NOTE — Patient Instructions (Signed)
Order- non Contrast CT chest   Dx dyspnea, interstitial lung disease  Order- [facility]- Patient would like to discuss getting an [facility] primary physician

## 2011-05-15 ENCOUNTER — Ambulatory Visit (INDEPENDENT_AMBULATORY_CARE_PROVIDER_SITE_OTHER)
Admission: RE | Admit: 2011-05-15 | Discharge: 2011-05-15 | Disposition: A | Discharge status: Home / Self Care | Payer: Medicare Other | Source: Ambulatory Visit | Attending: Cardiology | Admitting: Cardiology

## 2011-05-15 DIAGNOSIS — J8410 Pulmonary fibrosis, unspecified: Secondary | ICD-10-CM

## 2011-05-15 DIAGNOSIS — J849 Interstitial pulmonary disease, unspecified: Secondary | ICD-10-CM

## 2011-05-18 NOTE — Assessment & Plan Note (Signed)
Continues good compliance and control with CPAP.

## 2011-05-18 NOTE — Assessment & Plan Note (Signed)
Seasonal rhinitis exacerbation.  Plan- OTC antihistamine

## 2011-05-18 NOTE — Assessment & Plan Note (Addendum)
Pulmonary function tests are very similar to those of 2 years ago, in 2011. There is mild obstructive airways disease in small airways with response to bronchodilator, hyperinflation and air trapping. Moderate to severe diffusion capacity reduction is probably multifactorial, including components of lung disease and abdominal obesity , but probably also a cardiac component. She is deconditioned and overweight , further contributing to exertional dyspnea. I would expect restoration of normal aortic valve function to improve her exercise tolerance , but I can't predict how much. Cardiac catheterization would help in this determination if she and Dr.Hochrein were prepared to go through that. She would have to decide that limited improvement would be worth surgical risk , before going forward with any valvular surgical intervention.  I heard basilar crackles on exam today which we discussed. Interstitial edema or fibrosis could sound like this, although not appreciated on chest x-ray of December 2012. Plan- noncontrast chest CT looking for interstitial lung disease or edema.

## 2011-05-22 ENCOUNTER — Encounter: Payer: Self-pay | Admitting: Cardiology

## 2011-05-23 NOTE — Progress Notes (Signed)
Quick Note:  Pt aware of results. ______ 

## 2011-06-06 ENCOUNTER — Telehealth: Payer: Self-pay | Admitting: Cardiology

## 2011-06-06 DIAGNOSIS — Z01810 Encounter for preprocedural cardiovascular examination: Secondary | ICD-10-CM

## 2011-06-06 DIAGNOSIS — R0602 Shortness of breath: Secondary | ICD-10-CM

## 2011-06-06 DIAGNOSIS — I359 Nonrheumatic aortic valve disorder, unspecified: Secondary | ICD-10-CM

## 2011-06-06 NOTE — Telephone Encounter (Signed)
She needs a right and left heart cath by me in the [facility] lab.

## 2011-06-06 NOTE — Telephone Encounter (Signed)
Left message for pt to call back.  Need to see when she wants to schedule JV cath (for dyspnea and aortic stenosis)

## 2011-06-06 NOTE — Telephone Encounter (Signed)
Pt needs to know if she will be having any procedures done because she would like to get it set up soon so her daughter can help her if needed in June because her daughter lives in [place]. Per pt at last visit you where deciding if she needed a cath or what

## 2011-06-06 NOTE — Telephone Encounter (Signed)
Will forward to MD for review

## 2011-06-09 ENCOUNTER — Encounter: Payer: Self-pay | Admitting: *Deleted

## 2011-06-09 ENCOUNTER — Other Ambulatory Visit: Payer: Self-pay | Admitting: Cardiology

## 2011-06-09 DIAGNOSIS — I350 Nonrheumatic aortic (valve) stenosis: Secondary | ICD-10-CM

## 2011-06-09 NOTE — Telephone Encounter (Signed)
Pt aware that she needs a cardiac cath.  She would like for it to be done 06/27/2011 by Dr Antoine Poche.  She will come into the office for labs to be drawn 06/24/2011.  Instructions and directions will be given at that time.  Pt scheduled for 5/10 at 9:30.  Pt to be there at 8:30 am

## 2011-06-24 ENCOUNTER — Other Ambulatory Visit (INDEPENDENT_AMBULATORY_CARE_PROVIDER_SITE_OTHER): Payer: Medicare Other

## 2011-06-24 DIAGNOSIS — Z01810 Encounter for preprocedural cardiovascular examination: Secondary | ICD-10-CM

## 2011-06-24 DIAGNOSIS — I359 Nonrheumatic aortic valve disorder, unspecified: Secondary | ICD-10-CM

## 2011-06-24 DIAGNOSIS — R0602 Shortness of breath: Secondary | ICD-10-CM

## 2011-06-24 LAB — CBC WITH DIFFERENTIAL/PLATELET
Basophils Absolute: 0 10*3/uL (ref 0.0–0.1)
Eosinophils Absolute: 0.3 10*3/uL (ref 0.0–0.7)
Hemoglobin: 13.4 g/dL (ref 12.0–15.0)
Lymphocytes Relative: 29.8 % (ref 12.0–46.0)
MCHC: 33 g/dL (ref 30.0–36.0)
Neutro Abs: 3.4 10*3/uL (ref 1.4–7.7)
Neutrophils Relative %: 53.6 % (ref 43.0–77.0)
Platelets: 239 10*3/uL (ref 150.0–400.0)
RDW: 15.1 % — ABNORMAL HIGH (ref 11.5–14.6)

## 2011-06-24 LAB — PROTIME-INR: Prothrombin Time: 10.1 s — ABNORMAL LOW (ref 10.2–12.4)

## 2011-06-24 LAB — BASIC METABOLIC PANEL
BUN: 20 mg/dL (ref 6–23)
CO2: 23 mEq/L (ref 19–32)
Calcium: 9.4 mg/dL (ref 8.4–10.5)
Creatinine, Ser: 0.8 mg/dL (ref 0.4–1.2)
GFR: 72.21 mL/min (ref >60.00)
Glucose, Bld: 98 mg/dL (ref 70–99)

## 2011-06-27 ENCOUNTER — Inpatient Hospital Stay (HOSPITAL_BASED_OUTPATIENT_CLINIC_OR_DEPARTMENT_OTHER)
Admission: RE | Admit: 2011-06-27 | Discharge: 2011-06-27 | Disposition: A | Discharge status: Home / Self Care | Payer: Medicare Other | Source: Ambulatory Visit | Attending: Cardiology | Admitting: Cardiology

## 2011-06-27 ENCOUNTER — Encounter (HOSPITAL_BASED_OUTPATIENT_CLINIC_OR_DEPARTMENT_OTHER)
Admission: RE | Disposition: A | Discharge status: Home / Self Care | Payer: Self-pay | Source: Ambulatory Visit | Attending: Cardiology

## 2011-06-27 DIAGNOSIS — I350 Nonrheumatic aortic (valve) stenosis: Secondary | ICD-10-CM

## 2011-06-27 DIAGNOSIS — I359 Nonrheumatic aortic valve disorder, unspecified: Secondary | ICD-10-CM

## 2011-06-27 DIAGNOSIS — R0602 Shortness of breath: Secondary | ICD-10-CM

## 2011-06-27 DIAGNOSIS — R0609 Other forms of dyspnea: Secondary | ICD-10-CM | POA: Insufficient documentation

## 2011-06-27 DIAGNOSIS — R0989 Other specified symptoms and signs involving the circulatory and respiratory systems: Secondary | ICD-10-CM | POA: Insufficient documentation

## 2011-06-27 LAB — POCT I-STAT 3, VENOUS BLOOD GAS (G3P V)
Acid-base deficit: 1 mmol/L (ref 0.0–2.0)
Bicarbonate: 22.7 mEq/L (ref 20.0–24.0)
Bicarbonate: 25.8 mEq/L — ABNORMAL HIGH (ref 20.0–24.0)
O2 Saturation: 64 %
O2 Saturation: 95 %
TCO2: 24 mmol/L (ref 0–100)
TCO2: 27 mmol/L (ref 0–100)
pCO2, Ven: 48.7 mmHg (ref 45.0–50.0)
pO2, Ven: 38 mmHg (ref 30.0–45.0)

## 2011-06-27 SURGERY — JV LEFT AND RIGHT HEART CATHETERIZATION WITH CORONARY ANGIOGRAM
Anesthesia: Moderate Sedation

## 2011-06-27 MED ORDER — SODIUM CHLORIDE 0.9 % IJ SOLN: 3.0000 mL | Freq: Two times a day (BID) | INTRAMUSCULAR | Status: DC

## 2011-06-27 MED ORDER — SODIUM CHLORIDE 0.9 % IV SOLN: 1.0000 mL/kg/h | INTRAVENOUS | Status: DC

## 2011-06-27 MED ORDER — SODIUM CHLORIDE 0.9 % IV SOLN
INTRAVENOUS | Status: DC
  Administered 2011-06-27: 10:00:00 via INTRAVENOUS

## 2011-06-27 MED ORDER — SODIUM CHLORIDE 0.9 % IJ SOLN: 3.0000 mL | INTRAMUSCULAR | Status: DC | PRN

## 2011-06-27 MED ORDER — ACETAMINOPHEN 325 MG PO TABS: 650.0000 mg | ORAL_TABLET | ORAL | Status: DC | PRN

## 2011-06-27 MED ORDER — SODIUM CHLORIDE 0.9 % IV SOLN: 250.0000 mL | INTRAVENOUS | Status: DC | PRN

## 2011-06-27 MED ORDER — ONDANSETRON HCL 4 MG/2ML IJ SOLN: 4.0000 mg | Freq: Four times a day (QID) | INTRAMUSCULAR | Status: DC | PRN

## 2011-06-27 MED ORDER — ASPIRIN 81 MG PO CHEW
324.0000 mg | CHEWABLE_TABLET | ORAL | Status: AC
  Administered 2011-06-27: 324 mg via ORAL

## 2011-06-27 NOTE — H&P (Signed)
[name]   05/09/2011 10:45 AM Office Visit  MRN: [id number]   Description: 77 year old female  Provider: Rollene Rotunda, MD  Department: [facility]     HPI The patient presents for followup of dyspnea. She's had some aortic stenosis.  She has continued to follow with Dr. Maple Hudson. She's had only mildly abnormal pulmonary function testing. 6 minute walk test didn't demonstrate very significant desaturation. He wondered again whether her cardiac pathology could be contributing to her complaints of dyspnea. Echo demonstrated that her aortic stenosis has progressed and is severe with a mean gradient of 38. However, it is not critical aortic stenosis. Her BNP level has been normal.  Pulmonary function testing did not suggest a clear etiology for her dyspnea.  She continues to say that her shortness of breath is worse and limiting her quality-of-life. She reports that she is dyspneic at the top of a flight of stairs in her home. She's not describing PND or orthopnea. She does have occasional chest pressure that she notices after she sits and relaxes. However, this is not reproducible with all activity. She's not describing palpitations, presyncope or syncope. She has had no new weight gain or edema. She does sleep with CPAP and oxygen.      Allergies   Allergen  Reactions   .  Valacyclovir Hcl         Current Outpatient Prescriptions   Medication  Sig  Dispense  Refill   .  amLODipine (NORVASC) 10 MG tablet  Take 15 mg by mouth daily.           Marland Kitchen  aspirin 81 MG tablet  Take 81 mg by mouth daily.           Marland Kitchen  atorvastatin (LIPITOR) 80 MG tablet  Take 80 mg by mouth daily.           .  Calcium Carbonate-Vit D-Min (CALCIUM 1200 PO)  Take 1 capsule by mouth daily.          .  cetirizine (ZYRTEC) 10 MG tablet  Take 10 mg by mouth daily.           Marland Kitchen  etodolac (LODINE) 500 MG tablet  Take 1 tablet by mouth Twice daily.         Marland Kitchen  gabapentin (NEURONTIN) 600 MG tablet  Take 600 mg  by mouth daily.           Marland Kitchen  levothyroxine (SYNTHROID, LEVOTHROID) 25 MCG tablet  Take 25 mcg by mouth daily.           Marland Kitchen  losartan (COZAAR) 50 MG tablet  Take 1 tablet by mouth daily.         .  potassium chloride (KLOR-CON) 10 MEQ CR tablet  Take 10 mEq by mouth daily.           .  sitaGLIPtan-metformin (JANUMET) 50-1000 MG per tablet  Take 1 tablet by mouth daily.               Past Medical History   Diagnosis  Date   .  Diabetes mellitus     .  Hyperlipidemia     .  Hypertension     .  Sleep apnea         AHI 89/hr   .  Allergic rhinitis     .  COPD (chronic obstructive pulmonary disease)         PFT 08/08/09-FEV1 1.98/107; R 0.77;  small airway obst w/tresp to dilator;DLCO 44%   .  Fibromuscular dysplasia         RAS       Past Surgical History   Procedure  Date   .  Pituitary tumor removed         x 2   .  Back surgery  2005   .  Tonsillectomy     .  Nasal septal deviation     .  Bilateral renal bypass      ROS: As stated in the HPI and negative for all other systems.   PHYSICAL EXAM BP 119/70  Pulse 70  Ht 5\' 3"  (1.6 m)  Wt 186 lb (84.369 kg)  BMI 32.95 kg/m2 GENERAL:  Well appearing HEENT:  Pupils equal round and reactive, fundi not visualized, oral mucosa unremarkable NECK:  No jugular venous distention, waveform within normal limits, carotid upstroke brisk and symmetric, no bruits, transmitted systolic murmur, no thyromegaly LYMPHATICS:  No cervical, inguinal adenopathy LUNGS:  Clear to auscultation bilaterally BACK:  No CVA tenderness CHEST:  Unremarkable HEART:  PMI not displaced or sustained,S1 and S2 within normal limits, no S3, no S4, no clicks, no rubs, apical systolic murmur radiating out the outflow tract and mid peaking ABD:  Flat, positive bowel sounds normal in frequency in pitch, no bruits, no rebound, no guarding, no midline pulsatile mass, no hepatomegaly, no splenomegaly EXT:  2 plus pulses throughout, moderate bilateral lower extremity edema  with venous stasis changes, no cyanosis no clubbing SKIN:  No rashes no nodules NEURO:  Cranial nerves II through XII grossly intact, motor grossly intact throughout PSYCH:  Cognitively intact, oriented to person place and time   ASSESSMENT AND PLAN   1)  Dypsnea:  Given the severe AS on echo and continued dyspnea she is to have right and left heart cath today.  The patient understands that risks included but are not limited to stroke (1 in 1000), death (1 in 1000), kidney failure [usually temporary] (1 in 500), bleeding (1 in 200), allergic reaction [possibly serious] (1 in 200).  The patient understands and agrees to proceed.   2) HYPERTENSION   The blood pressure is at target. No change in medications is indicated. We will continue with therapeutic lifestyle changes (TLC).     Rollene Rotunda.  06/27/2011.  10:04 AM

## 2011-06-27 NOTE — CV Procedure (Signed)
  Cardiac Catheterization Procedure Note  Name: RIKA DAUGHDRILL MRN: [id number] DOB: 1934/11/06  Procedure: Right Heart Cath, Left Heart Cath, Selective Coronary Angiography, LV angiography  Indication:   Dyspnea and aortic stenosis.  Procedural Details: The right groin was prepped, draped, and anesthetized with 1% lidocaine. Using the modified Seldinger technique a 4 French sheath was placed in the right femoral artery and a 7 French sheath was placed in the right femoral vein. A Swan-Ganz catheter was used for the right heart catheterization. Standard protocol was followed for recording of right heart pressures and sampling of oxygen saturations. Fick cardiac output was calculated. Standard Judkins catheters were used for selective coronary angiography and left ventriculography. There were no immediate procedural complications. The patient was transferred to the post catheterization recovery area for further monitoring.  Procedural Findings:  Hemodynamics:               RA 2    RV 32/3    PA 34/11  mean 22    PCWP Mean 11    LV 161/18    AO 146/96    AV area  1.74    AV gradient (mean)  6.72  Oxygen saturations:    PA 64    AO 95   Cardiac Output (Fick) 4.2                               Cardiac Index (Fick) 2.2   Coronary angiography:  Coronary dominance: Right  Left mainstem: Normal  Left anterior descending (LAD): Mild luminal irregularities.  Small diagonals normal.  Left circumflex (LCx): RI large and normal.  AV groove mild luminal irregularities  Right coronary artery (RCA): Normal.  PDA large normal.  PL small to moderate and normal.  Left ventriculography: Left ventricular systolic function is normal, LVEF is estimated at 55-65%, there is no significant mitral regurgitation   Final Conclusions:  Normal coronaries.  NL LF function.  Only mild AS by this measurement.  Echo suggests severe.  Pulmonary pressures and EDP are not elevated.  Recommendations:  Based  on this the aortic valve does not appear to be the etiology for her dypsnea.  No further cardiac work up.     Fayrene Fearing Jasani Lengel 06/27/2011, 11:13 AM

## 2011-06-27 NOTE — Progress Notes (Signed)
Bedrest begins @ 1130. 

## 2011-07-10 ENCOUNTER — Encounter: Payer: Self-pay | Admitting: Physician Assistant

## 2011-07-10 ENCOUNTER — Ambulatory Visit (INDEPENDENT_AMBULATORY_CARE_PROVIDER_SITE_OTHER): Payer: Medicare Other | Admitting: Physician Assistant

## 2011-07-10 VITALS — BP 160/86 | HR 67 | Ht 63.5 in | Wt 181.8 lb

## 2011-07-10 DIAGNOSIS — R0602 Shortness of breath: Secondary | ICD-10-CM

## 2011-07-10 NOTE — Progress Notes (Addendum)
[address] [place], [place]  [address] Phone: [phone] Fax:  [phone]  Date:  07/10/2011   Name:  [name]   DOB:  November 18, 1934   MRN:  [id number]  PCP:  Gaspar Garbe, MD, MD  Primary Cardiologist:  Dr. Rollene Rotunda  Primary Electrophysiologist:  None    History of Present Illness: [name] is a 77 y.o. female who returns for follow up post cath.  She has had significant problems with dyspnea.  Recent echocardiogram suggested worsening of her aortic stenosis.  Echo 2/13: Moderate LVH, EF 60-65%, dynamic obstruction with mid cavity obliteration, grade 1 diastolic dysfunction, moderate to severe aortic stenosis, mean gradient 38, MAC, mild LAE, PSP 59.  She had mild short of disease on PFTs.  Right and left heart catheterization was arranged.  Right and left HC5/10/13: Mild luminal irregularities, EF 55-65%, normal pulmonary pressures and left heart pressures.  Of note, she had mild aortic stenosis.  Her dyspnea did not appear to be cardiac.  No further cardiac workup was recommended.  Today she notes that her breathing is about the same.  She denies chest pain, syncope, orthopnea, PND.  She has chronic pedal edema without change.   Wt Readings from Last 3 Encounters:  07/10/11 181 lb 12.8 oz (82.464 kg)  06/27/11 186 lb (84.369 kg)  06/27/11 186 lb (84.369 kg)     Potassium  Date/Time Value Range Status  06/24/2011 10:28 AM 3.5  3.5-5.1 (mEq/L) Final     Creatinine, Ser  Date/Time Value Range Status  06/24/2011 10:28 AM 0.8  0.4-1.2 (mg/dL) Final     ALT  Date/Time Value Range Status  01/07/2008 12:33 PM 18  0-35 (U/L) Final     Hemoglobin  Date/Time Value Range Status  06/24/2011 10:28 AM 13.4  12.0-15.0 (g/dL) Final    Past Medical History  Diagnosis Date  . Diabetes mellitus   . Hyperlipidemia   . Hypertension   . Sleep apnea     AHI 89/hr  . Allergic rhinitis   . COPD (chronic obstructive pulmonary  disease)     PFT 08/08/09-FEV1 1.98/107; R 0.77; small airway obst w/tresp to dilator;DLCO 44%  . Fibromuscular dysplasia     RAS    Current Outpatient Prescriptions  Medication Sig Dispense Refill  . ACCU-CHEK AVIVA PLUS test strip       . amLODipine (NORVASC) 10 MG tablet Take 15 mg by mouth daily.        Marland Kitchen aspirin 81 MG tablet Take 81 mg by mouth daily.        Marland Kitchen atorvastatin (LIPITOR) 80 MG tablet Take 80 mg by mouth daily.        . Calcium Carbonate-Vit D-Min (CALCIUM 1200 PO) Take 1 capsule by mouth daily.       Marland Kitchen etodolac (LODINE) 500 MG tablet Take 1 tablet by mouth Twice daily.      Marland Kitchen gabapentin (NEURONTIN) 600 MG tablet Take 600 mg by mouth daily.        Marland Kitchen levothyroxine (SYNTHROID, LEVOTHROID) 25 MCG tablet Take 25 mcg by mouth daily.        Marland Kitchen loratadine (CLARITIN) 10 MG tablet Take 10 mg by mouth daily.      Marland Kitchen losartan (COZAAR) 50 MG tablet Take 1 tablet by mouth daily.      . potassium chloride (KLOR-CON) 10 MEQ CR tablet Take 10 mEq by mouth daily.        . sitaGLIPtan-metformin (JANUMET)  50-1000 MG per tablet Take 1 tablet by mouth daily.          Allergies: Allergies  Allergen Reactions  . Valacyclovir Hcl     History  Substance Use Topics  . Smoking status: Former Smoker -- 1.0 packs/day for 35 years    Types: Cigarettes    Quit date: 02/17/1997  . Smokeless tobacco: Not on file  . Alcohol Use: No     PHYSICAL EXAM: VS:  BP 160/86  Pulse 67  Ht 5' 3.5" (1.613 m)  Wt 181 lb 12.8 oz (82.464 kg)  BMI 31.70 kg/m2 Well nourished, well developed, in no acute distress HEENT: normal Neck: no JVD Cardiac:  normal S1, S2; RRR; 1-2/6 systolic murmur Lungs:  clear to auscultation bilaterally, no wheezing, rhonchi or rales Abd: soft, nontender, no hepatomegaly Ext: 1+ bilateral ankle edema Skin: warm and dry Neuro:  CNs 2-12 intact, no focal abnormalities noted  EKG:  Sinus rhythm, heart rate 67, normal axis, nonspecific ST-T wave changes, no change from prior  tracing   ASSESSMENT AND PLAN:  1.  Aortic Stenosis Mild by cardiac catheterization. No further workup at this time. Followup with Dr. Antoine Poche as indicated.   2.  Dyspnea Normal coronary arteries and normal right and left heart pressures by right and left heart catheterization. No further cardiac workup at this time.   Luna Glasgow, PA-C  12:37 PM 07/10/2011

## 2011-07-10 NOTE — Patient Instructions (Signed)
Your physician recommends that you schedule a follow-up appointment as scheduled (we will call you if it is changed or canceled)

## 2011-08-14 ENCOUNTER — Encounter: Payer: Self-pay | Admitting: Cardiology

## 2011-08-14 ENCOUNTER — Ambulatory Visit (INDEPENDENT_AMBULATORY_CARE_PROVIDER_SITE_OTHER): Payer: Medicare Other | Admitting: Cardiology

## 2011-08-14 VITALS — BP 130/75 | HR 85 | Ht 63.0 in | Wt 180.8 lb

## 2011-08-14 DIAGNOSIS — R0602 Shortness of breath: Secondary | ICD-10-CM

## 2011-08-14 DIAGNOSIS — R5383 Other fatigue: Secondary | ICD-10-CM

## 2011-08-14 DIAGNOSIS — I10 Essential (primary) hypertension: Secondary | ICD-10-CM

## 2011-08-14 DIAGNOSIS — I359 Nonrheumatic aortic valve disorder, unspecified: Secondary | ICD-10-CM

## 2011-08-14 NOTE — Assessment & Plan Note (Signed)
The blood pressure is at target. No change in medications is indicated. We will continue with therapeutic lifestyle changes (TLC).  

## 2011-08-14 NOTE — Patient Instructions (Addendum)
The current medical regimen is effective;  continue present plan and medications.  Please have blood work today.  Follow up in 6 months with Dr Hochrein.  You will receive a letter in the mail 2 months before you are due.  Please call us when you receive this letter to schedule your follow up appointment.  

## 2011-08-14 NOTE — Progress Notes (Signed)
HPI The patient presents for followup of dyspnea and fatigue. She's had some aortic stenosis.  Dr. Maple Hudson who has followed her closely and does not think that her lung disease is the cause of her dyspnea. She was referred to me.  I drew a BNP which was only 48 suggesting (within 98% negative predictive value) that her dyspnea was unlikely to be cardiac. Right heart catheterization confirmed only mild aortic stenosis which was less than suggested by the echo. She had no obstructive coronary disease.  She returns for followup describing dyspnea with exertion such as climbing stairs. This is as described in her previous notes. However, her biggest complaint really is fatigue. She does sleep with CPAP and oxygen.  She's not describing PND or orthopnea. She does have joint pains. She has muscle aches.  Allergies  Allergen Reactions  . Valacyclovir Hcl     Current Outpatient Prescriptions  Medication Sig Dispense Refill  . ACCU-CHEK AVIVA PLUS test strip       . amLODipine (NORVASC) 10 MG tablet Take 15 mg by mouth daily.        Marland Kitchen aspirin 81 MG tablet Take 81 mg by mouth daily.        Marland Kitchen atorvastatin (LIPITOR) 80 MG tablet Take 80 mg by mouth daily.        . Calcium Carbonate-Vit D-Min (CALCIUM 1200 PO) Take 1 capsule by mouth daily.       Marland Kitchen etodolac (LODINE) 500 MG tablet Take 1 tablet by mouth Twice daily.      Marland Kitchen gabapentin (NEURONTIN) 600 MG tablet Take 600 mg by mouth daily.        Marland Kitchen levothyroxine (SYNTHROID, LEVOTHROID) 25 MCG tablet Take 25 mcg by mouth daily.        Marland Kitchen loratadine (CLARITIN) 10 MG tablet Take 10 mg by mouth daily.      Marland Kitchen losartan (COZAAR) 50 MG tablet Take 1 tablet by mouth daily.      . potassium chloride (KLOR-CON) 10 MEQ CR tablet Take 10 mEq by mouth daily.        . sitaGLIPtan-metformin (JANUMET) 50-1000 MG per tablet Take 1 tablet by mouth daily.          Past Medical History  Diagnosis Date  . Diabetes mellitus   . Hyperlipidemia   . Hypertension   . Sleep  apnea     AHI 89/hr  . Allergic rhinitis   . COPD (chronic obstructive pulmonary disease)     PFT 08/08/09-FEV1 1.98/107; R 0.77; small airway obst w/tresp to dilator;DLCO 44%  . Fibromuscular dysplasia     RAS    Past Surgical History  Procedure Date  . Pituitary tumor removed     x 2  . Back surgery 2005  . Tonsillectomy   . Nasal septal deviation   . Bilateral renal bypass    ROS: As stated in the HPI and negative for all other systems.  PHYSICAL EXAM BP 130/75  Pulse 85  Ht 5\' 3"  (1.6 m)  Wt 180 lb 12.8 oz (82.01 kg)  BMI 32.03 kg/m2 GENERAL:  Well appearing NECK:  No jugular venous distention, waveform within normal limits, carotid upstroke brisk and symmetric, no bruits, transmitted systolic murmur, no thyromegaly LYMPHATICS:  No cervical, inguinal adenopathy LUNGS:  Clear to auscultation bilaterally BACK:  No CVA tenderness CHEST:  Unremarkable HEART:  PMI not displaced or sustained,S1 and S2 within normal limits, no S3, no S4, no clicks, no rubs, apical systolic murmur radiating  out the outflow tract and mid peaking ABD:  Flat, positive bowel sounds normal in frequency in pitch, no bruits, no rebound, no guarding, no midline pulsatile mass, no hepatomegaly, no splenomegaly EXT:  2 plus pulses throughout, moderate bilateral lower extremity edema with venous stasis changes, no cyanosis no clubbing  Sinus rhythm, rate 85, axis within normal limits, intervals within normal limits, no acute ST-T wave changes.  08/14/2011   ASSESSMENT AND PLAN

## 2011-08-14 NOTE — Assessment & Plan Note (Signed)
This is her most significant complaint. She has had surgery it may have affected her pituitary. I will check a TSH and for other workup to Gaspar Garbe, MD.  She does have a history of sleep apnea and uses CPAP. She doesn't report any testing recently on her CPAP mask. I will defer to Dr. Maple Hudson to see if he thinks there is any merit to this.

## 2011-08-14 NOTE — Assessment & Plan Note (Signed)
Again with the normal right heart pressures and normal BNP I do not see a clear cardiac etiology to this complaint. I will refer her to her primary care physician and Dr. Maple Hudson.

## 2011-08-14 NOTE — Assessment & Plan Note (Signed)
This is not severe by direct pressure measurements of the right heart catheterization. I will follow this up with repeat echocardiography in one year.

## 2011-09-09 ENCOUNTER — Other Ambulatory Visit: Payer: Self-pay | Admitting: Dermatology

## 2011-09-12 ENCOUNTER — Ambulatory Visit (INDEPENDENT_AMBULATORY_CARE_PROVIDER_SITE_OTHER): Payer: Medicare Other | Admitting: Internal Medicine

## 2011-09-12 ENCOUNTER — Encounter: Payer: Self-pay | Admitting: Internal Medicine

## 2011-09-12 VITALS — BP 130/76 | HR 76 | Ht 63.5 in | Wt 177.4 lb

## 2011-09-12 DIAGNOSIS — J4489 Other specified chronic obstructive pulmonary disease: Secondary | ICD-10-CM

## 2011-09-12 DIAGNOSIS — G4733 Obstructive sleep apnea (adult) (pediatric): Secondary | ICD-10-CM

## 2011-09-12 DIAGNOSIS — J449 Chronic obstructive pulmonary disease, unspecified: Secondary | ICD-10-CM

## 2011-09-12 NOTE — Progress Notes (Signed)
01/29/11- 76 yoF former smoker followed for obstructive sleep apnea, allergic rhinitis, hypoxia during sleep complicated by heart murmur LOV- 01/29/2010 She uses CPAP 15/ [employer] all night every night and says she sleeps well unless the mask is bothering her. She uses oxygen 2L every night when at home but leaves it at home when she travels. More short of breath with brisk walking over the last 3-4 months, gradually noticed without sudden event. She denies cough, wheeze, chest pain or swelling. Office spirometry 01/29/11- FVC 2.33/ 87%, FEV1 1.69/ 84%, FEV1/FVC 0.72, FEF25-75 64%  03/14/11- 76 yoF former smoker followed for obstructive sleep apnea, allergic rhinitis, hypoxia during sleep complicated by heart murmur She has not noticed any change in her shortness of breath. Dyspnea on exertion with one flight of stairs but she can keep walking when she reaches the top. Little change from one day to the next or with weather changes. Prior echocardiogram had shown moderate aortic stenosis. 6 minute walk test 03/14/2011-started at 97%, fell to 93% by the end of her walk, rebounded to 96% after 2 minutes rest on room air. 288 m. Complaints of hip and knee pain. Office spirometry 03/31/2010-FEV1/FVC 84% indicating little obstructive airways disease. Chest x-ray-01/30/2011-no acute process, borderline cardiac enlargement. Old vertebral compression fracture and surgical repair.  05/13/11- 19 yoF former smoker followed for obstructive sleep apnea, allergic rhinitis, hypoxia during sleep complicated by heart murmur   Increased sneeze with pollen season but no cough or wheeze. Short of breath is mainly dyspnea on exertion. She paces herself and stops frequently during activities of daily living such as housecleaning.  Sister died of lung cancer last year.  She would like to change her primary physician to somebody in the [facility] group, to keep all in same system.  Dr. Hochrein/Cardiology had contacted me for  feedback once PFTs were done , weighing importance of cardiac disease to her dyspnea. CXR- 01/30/2011-stable , no active disease. Old vertebral compression fracture. PFT- 05/13/11- Mild obstructive disease in small airways, with response to bronchodilator, hyper inflation and airtrapping, reduced DLCO.  FEV1 1.87/104% , FEV1/FVC 0.77 , FEF 25-75% 1.60/77% with 21 % improvement in small airway flows after bronchodilator.   DLCO 47%    09/12/11- 77 yoF former smoker followed for obstructive sleep apnea, allergic rhinitis, hypoxia during sleep complicated by heart murmur   4 month follow up - SOB unchanged.  No wheezing, chest tightness, chest pain, or cough at this time. wearing cpap and o2 everynight x 6-9 hours.  She feels her breathing is okay. Continues CPAP 15/[employer] plus oxygen 2 L for sleep. She asks how long she can go without oxygen for sleep, because she wants to travel without carrying it. She has skipped single nights without problem. CT chest 05/23/11  images reviewed with her IMPRESSION:  1. Emphysema.  2. Suspect small airways disease.  Original Report Authenticated By: Rosealee Albee, M.D.   ROS-see HPI Constitutional:   No-   weight loss, night sweats, fevers, chills, fatigue, lassitude. HEENT:   No-  headaches, difficulty swallowing, tooth/dental problems, sore throat,       No-  sneezing, itching, ear ache, nasal congestion, post nasal drip,  CV:  No-   chest pain, orthopnea, PND,  +swelling in lower extremities, anasarca, dizziness, palpitations Resp: + shortness of breath with exertion, not at rest.              No-   productive cough,  No non-productive cough,  No- coughing up of  blood.              No-   change in color of mucus.  No- wheezing.   Skin: No-   rash or lesions. GI:  No-   heartburn, indigestion, abdominal pain, nausea, vomiting, GU: MS: Neuro-   No acute change.  Psych:  Denies anxiety or depression.  OBJ General- Alert, Oriented, Affect-appropriate,  Distress- none acute, obese Skin- rash-none, lesions- none, excoriation- none Lymphadenopathy- none Head- atraumatic            Eyes- Gross vision intact, PERRLA, conjunctivae clear secretions            Ears- Hearing, canals-normal            Nose- Clear, no-Septal dev, mucus, polyps, erosion, perforation             Throat- Mallampati II , mucosa clear , drainage- none, tonsils- atrophic Neck- flexible , trachea midline, no stridor , thyroid nl, carotid no bruit Chest - symmetrical excursion , unlabored           Heart/CV- RRR , +2/6 systolic murmur AS radiating to neck , no gallop  , no rub, nl s1 s2. Difficult to palpate radial pulses. Slow nail-bed capillary refill.                           - JVD- none , edema- none, stasis changes- none, varices- none           Lung- Crackles lower 1/2 bilaterally , dullness-none, rub- none           Chest wall-  Abd- Br/ Gen/ Rectal- Not done, not indicated Extrem- cyanosis- none, clubbing, none, atrophy- none, strength- nl Neuro- grossly intact to observation

## 2011-09-12 NOTE — Patient Instructions (Addendum)
Order- DME Patsy Lager- ONOX on her CPAP with room air    Dx OSA  Once I get the report of your overnight oximetry, you and I can decide whether to travel without oxygen, or to arrange for a portable concentrator.

## 2011-09-18 NOTE — Assessment & Plan Note (Signed)
We need to reassess her need for oxygen during sleep. If she still needs it, she may be able to utilize a [employer] for travel. Plan-overnight oximetry on room air with CPAP

## 2011-09-18 NOTE — Assessment & Plan Note (Signed)
Good compliance and control 

## 2011-10-01 ENCOUNTER — Ambulatory Visit: Payer: Medicare Other | Admitting: Internal Medicine

## 2011-10-03 ENCOUNTER — Encounter: Payer: Self-pay | Admitting: Internal Medicine

## 2012-07-19 ENCOUNTER — Other Ambulatory Visit: Payer: Self-pay

## 2012-07-19 DIAGNOSIS — Z1231 Encounter for screening mammogram for malignant neoplasm of breast: Secondary | ICD-10-CM

## 2012-08-17 ENCOUNTER — Ambulatory Visit: Payer: Medicare Other

## 2012-09-30 ENCOUNTER — Encounter (HOSPITAL_COMMUNITY): Payer: Self-pay | Admitting: Emergency Medicine

## 2012-09-30 ENCOUNTER — Emergency Department (HOSPITAL_COMMUNITY): Payer: Medicare PPO

## 2012-09-30 ENCOUNTER — Inpatient Hospital Stay (HOSPITAL_COMMUNITY)
Admission: EM | Admit: 2012-09-30 | Discharge: 2012-10-05 | DRG: 384 | Disposition: A | Discharge status: Skilled Nursing Facility | Payer: Medicare PPO | Attending: Internal Medicine | Admitting: Internal Medicine

## 2012-09-30 DIAGNOSIS — K269 Duodenal ulcer, unspecified as acute or chronic, without hemorrhage or perforation: Principal | ICD-10-CM | POA: Diagnosis present

## 2012-09-30 DIAGNOSIS — R9431 Abnormal electrocardiogram [ECG] [EKG]: Secondary | ICD-10-CM | POA: Diagnosis present

## 2012-09-30 DIAGNOSIS — I722 Aneurysm of renal artery: Secondary | ICD-10-CM | POA: Diagnosis present

## 2012-09-30 DIAGNOSIS — J449 Chronic obstructive pulmonary disease, unspecified: Secondary | ICD-10-CM | POA: Diagnosis present

## 2012-09-30 DIAGNOSIS — K2990 Gastroduodenitis, unspecified, without bleeding: Secondary | ICD-10-CM

## 2012-09-30 DIAGNOSIS — E1142 Type 2 diabetes mellitus with diabetic polyneuropathy: Secondary | ICD-10-CM | POA: Diagnosis present

## 2012-09-30 DIAGNOSIS — Z87891 Personal history of nicotine dependence: Secondary | ICD-10-CM

## 2012-09-30 DIAGNOSIS — G4733 Obstructive sleep apnea (adult) (pediatric): Secondary | ICD-10-CM | POA: Diagnosis present

## 2012-09-30 DIAGNOSIS — E278 Other specified disorders of adrenal gland: Secondary | ICD-10-CM | POA: Diagnosis present

## 2012-09-30 DIAGNOSIS — IMO0002 Reserved for concepts with insufficient information to code with codable children: Secondary | ICD-10-CM | POA: Diagnosis present

## 2012-09-30 DIAGNOSIS — M1710 Unilateral primary osteoarthritis, unspecified knee: Secondary | ICD-10-CM | POA: Diagnosis present

## 2012-09-30 DIAGNOSIS — R634 Abnormal weight loss: Secondary | ICD-10-CM | POA: Diagnosis present

## 2012-09-30 DIAGNOSIS — M810 Age-related osteoporosis without current pathological fracture: Secondary | ICD-10-CM | POA: Diagnosis present

## 2012-09-30 DIAGNOSIS — D500 Iron deficiency anemia secondary to blood loss (chronic): Secondary | ICD-10-CM | POA: Diagnosis present

## 2012-09-30 DIAGNOSIS — T3995XA Adverse effect of unspecified nonopioid analgesic, antipyretic and antirheumatic, initial encounter: Secondary | ICD-10-CM | POA: Diagnosis present

## 2012-09-30 DIAGNOSIS — Z7982 Long term (current) use of aspirin: Secondary | ICD-10-CM

## 2012-09-30 DIAGNOSIS — R112 Nausea with vomiting, unspecified: Secondary | ICD-10-CM | POA: Diagnosis present

## 2012-09-30 DIAGNOSIS — E441 Mild protein-calorie malnutrition: Secondary | ICD-10-CM | POA: Diagnosis present

## 2012-09-30 DIAGNOSIS — E785 Hyperlipidemia, unspecified: Secondary | ICD-10-CM | POA: Diagnosis present

## 2012-09-30 DIAGNOSIS — J4489 Other specified chronic obstructive pulmonary disease: Secondary | ICD-10-CM | POA: Diagnosis present

## 2012-09-30 DIAGNOSIS — K449 Diaphragmatic hernia without obstruction or gangrene: Secondary | ICD-10-CM | POA: Diagnosis present

## 2012-09-30 DIAGNOSIS — E1149 Type 2 diabetes mellitus with other diabetic neurological complication: Secondary | ICD-10-CM | POA: Diagnosis present

## 2012-09-30 DIAGNOSIS — I7789 Other specified disorders of arteries and arterioles: Secondary | ICD-10-CM | POA: Diagnosis present

## 2012-09-30 DIAGNOSIS — Z79899 Other long term (current) drug therapy: Secondary | ICD-10-CM

## 2012-09-30 HISTORY — DX: Nausea with vomiting, unspecified: R11.2

## 2012-09-30 HISTORY — DX: Other complications of anesthesia, initial encounter: T88.59XA

## 2012-09-30 HISTORY — DX: Adverse effect of unspecified anesthetic, initial encounter: T41.45XA

## 2012-09-30 HISTORY — DX: Other specified postprocedural states: Z98.890

## 2012-09-30 LAB — CBC
Hemoglobin: 13.2 g/dL (ref 12.0–15.0)
MCH: 28.7 pg (ref 26.0–34.0)
MCHC: 33.8 g/dL (ref 30.0–36.0)
MCV: 84.8 fL (ref 78.0–100.0)
RBC: 4.6 MIL/uL (ref 3.87–5.11)

## 2012-09-30 LAB — LIPASE, BLOOD: Lipase: 41 U/L (ref 11–59)

## 2012-09-30 LAB — COMPREHENSIVE METABOLIC PANEL
ALT: 13 U/L (ref 0–35)
CO2: 20 mEq/L (ref 19–32)
Calcium: 10.1 mg/dL (ref 8.4–10.5)
Creatinine, Ser: 0.78 mg/dL (ref 0.50–1.10)
GFR calc Af Amer: >90 mL/min (ref >90)
GFR calc non Af Amer: 79 mL/min — ABNORMAL LOW (ref >90)
Glucose, Bld: 91 mg/dL (ref 70–99)
Sodium: 135 mEq/L (ref 135–145)
Total Protein: 7.8 g/dL (ref 6.0–8.3)

## 2012-09-30 LAB — TROPONIN I: Troponin I: <0.3 ng/mL (ref <0.30)

## 2012-09-30 LAB — POCT I-STAT TROPONIN I

## 2012-09-30 MED ORDER — MORPHINE SULFATE 4 MG/ML IJ SOLN
4.0000 mg | Freq: Once | INTRAMUSCULAR | Status: AC
  Administered 2012-09-30: 4 mg via INTRAVENOUS
  Filled 2012-09-30: qty 1

## 2012-09-30 MED ORDER — ONDANSETRON HCL 4 MG/2ML IJ SOLN: 4.0000 mg | Freq: Once | INTRAMUSCULAR | Status: AC

## 2012-09-30 MED ORDER — ONDANSETRON HCL 4 MG/2ML IJ SOLN
INTRAMUSCULAR | Status: AC
  Administered 2012-09-30: 4 mg via INTRAVENOUS
  Filled 2012-09-30: qty 2

## 2012-09-30 MED ORDER — ONDANSETRON HCL 4 MG/2ML IJ SOLN
4.0000 mg | Freq: Once | INTRAMUSCULAR | Status: AC
  Administered 2012-09-30: 4 mg via INTRAVENOUS
  Filled 2012-09-30: qty 2

## 2012-09-30 MED ORDER — IOHEXOL 300 MG/ML  SOLN
25.0000 mL | INTRAMUSCULAR | Status: DC
  Administered 2012-09-30: 25 mL via ORAL

## 2012-09-30 NOTE — ED Notes (Signed)
Pt's 02 sats read 88%, pt placed on 2L 02. 02 sats now reading 98%.

## 2012-09-30 NOTE — ED Provider Notes (Signed)
CSN: [id number]     Arrival date & time 09/30/12  1907 History     First MD Initiated Contact with Patient 09/30/12 1919     Chief Complaint  Patient presents with  . Chest Pain   HPI Patient reports worsening abdominal pain over the past 24-36 hours.  She reports she feels better if she lays on her left side.  She reports the pain is mainly in her upper abdomen and the right side of her abdomen.  She reports nausea and is vomiting in the emergency department.  She denies diarrhea.  Chills without documented fever.  No cough or congestion.  She reports the pain radiates up into her chest.  She reports decreased oral intake today.  She's never had pain like this before.  She does have a history of bilateral renal bypass many years ago.  She is at diet controlled diabetic at this time.  Her symptoms are moderate to severe in severity Past Medical History  Diagnosis Date  . Diabetes mellitus   . Hyperlipidemia   . Hypertension   . Sleep apnea     AHI 89/hr  . Allergic rhinitis   . COPD (chronic obstructive pulmonary disease)     PFT 08/08/09-FEV1 1.98/107; R 0.77; small airway obst w/tresp to dilator;DLCO 44%  . Fibromuscular dysplasia     RAS   Past Surgical History  Procedure Laterality Date  . Pituitary tumor removed      x 2  . Back surgery  2005  . Tonsillectomy    . Nasal septal deviation    . Bilateral renal bypass     Family History  Problem Relation Age of Onset  . Stroke Father   . Breast cancer Mother    History  Substance Use Topics  . Smoking status: Former Smoker -- 1.00 packs/day for 35 years    Types: Cigarettes    Quit date: 02/17/1997  . Smokeless tobacco: Never Used  . Alcohol Use: No   OB History   Grav Para Term Preterm Abortions TAB SAB Ect Mult Living                 Review of Systems  All other systems reviewed and are negative.    Allergies  Valacyclovir hcl  Home Medications   Current Outpatient Rx  Name  Route  Sig  Dispense  Refill   . ACCU-CHEK AVIVA PLUS test strip               . amLODipine (NORVASC) 10 MG tablet   Oral   Take 15 mg by mouth daily.           Marland Kitchen aspirin 81 MG tablet      ON HOLD         . atorvastatin (LIPITOR) 80 MG tablet   Oral   Take 80 mg by mouth daily.           . Calcium Carbonate-Vit D-Min (CALCIUM 1200 PO)   Oral   Take 1 capsule by mouth daily.          . clobetasol ointment (TEMOVATE) 0.05 %      as needed.         . etodolac (LODINE) 500 MG tablet   Oral   Take 1 tablet by mouth Twice daily.         . fexofenadine (ALLEGRA) 180 MG tablet   Oral   Take 180 mg by mouth daily.         Marland Kitchen  gabapentin (NEURONTIN) 600 MG tablet   Oral   Take 600 mg by mouth daily.           Marland Kitchen losartan (COZAAR) 50 MG tablet   Oral   Take 1 tablet by mouth daily.         . potassium chloride (KLOR-CON) 10 MEQ CR tablet   Oral   Take 10 mEq by mouth daily.            BP 123/69  Pulse 78  Temp(Src) 99.2 F (37.3 C) (Rectal)  Resp 27  SpO2 98% Physical Exam  Nursing note and vitals reviewed. Constitutional: She is oriented to person, place, and time. She appears well-developed and well-nourished. No distress.  HENT:  Head: Normocephalic and atraumatic.  Eyes: EOM are normal.  Neck: Normal range of motion.  Cardiovascular: Normal rate, regular rhythm and normal heart sounds.   Pulmonary/Chest: Effort normal and breath sounds normal.  Abdominal: Soft. She exhibits no distension.  Epigastric and upper quadrant tenderness without guarding or rebound  Musculoskeletal: Normal range of motion.  Neurological: She is alert and oriented to person, place, and time.  Skin: Skin is warm and dry.  Psychiatric: She has a normal mood and affect. Judgment normal.    ED Course   Procedures (including critical care time)   Date: 09/30/2012  Rate: 81  Rhythm: normal sinus rhythm  QRS Axis: normal  Intervals: normal  ST/T Wave abnormalities: Nonspecific ST changes   Conduction Disutrbances: none  Narrative Interpretation:   Old EKG Reviewed: No significant changes noted     Labs Reviewed  COMPREHENSIVE METABOLIC PANEL - Abnormal; Notable for the following:    GFR calc non Af Amer 79 (*)    All other components within normal limits  CBC  LIPASE, BLOOD  TROPONIN I  POCT I-STAT TROPONIN I   Dg Chest 2 View  09/30/2012   *RADIOLOGY REPORT*  Clinical Data: Chest pain  CHEST - 2 VIEW  Comparison: 01/29/2011  Findings: The heart and pulmonary vascularity are within normal limits.  The lungs are clear bilaterally.  There are changes consistent with prior vertebral augmentation.  No acute abnormality is seen.  IMPRESSION: No acute abnormality noted.   Original Report Authenticated By: Alcide Clever, M.D.   US Abdomen Complete  09/30/2012   *RADIOLOGY REPORT*  Clinical Data:  Chest pain  ABDOMINAL ULTRASOUND COMPLETE  Comparison:  None.  Findings:  Gallbladder:  No gallstones, gallbladder wall thickening, or pericholecystic fluid.  Common Bile Duct:  Within normal limits in caliber.  Liver: No focal mass lesion identified.  Within normal limits in parenchymal echogenicity.  IVC:  Appears normal.  Pancreas:  No abnormality identified.  Spleen:  Within normal limits in size and echotexture.  Right kidney:  8.7 cm.  Echogenic with renal cortical thinning but no hydronephrosis.  Left kidney:  10.4 cm.  Echogenic with renal cortical thinning but no hydronephrosis.  Abdominal Aorta:  No aneurysm.  The sonographer questioned bowel wall thickening at the level of the colonic hepatic flexure measuring 9 mm, but this segment appears collapsed and this finding is of unknown clinical significance.  IMPRESSION: Small echogenic kidneys likely indicating chronic medical renal disease.  No acute finding.  Possible hepatic flexure colonic wall thickening but artifactual thickening related to decompression is possible.   Original Report Authenticated By: Christiana Pellant, M.D.   I  personally reviewed the imaging tests through PACS system I reviewed available ER/hospitalization records through the EMR   No  diagnosis found.  MDM  Ultrasound without evidence of cholelithiasis or cholecystitis.  Given her ongoing right-sided abdominal pain CT scan will be performed.  Patient continues to vomit in the emergency department.  Zofran.  Lyanne Co, MD 10/01/12 [id number]

## 2012-09-30 NOTE — ED Notes (Addendum)
Per EMS pt has not been feeling well since Monday, pt states she has not been able to eat or drink anything. Pt is a diabetic, pt states she has not taken any medications in 6 months as she lost a lot of weight and does not need her medicines anymore. Pt complains of chest pain, centralized and underneath her ribs. Per EMS pt states the pain radiates down her abdomen. Per EMS pt states the pain feels like someone is sitting on her chest. Pt given 4mg  of zofran. Per EMS pt states she had aspirin (325 mg) prior to EMS arrival. Per EMS pt has had 3 nitroglycerin. Per EMS pt's EKG shows ST depression in lead 3, 4, 5, & 6. EMS reports they performed a posterior and a B4R and did not find any ST elevation. Per EMS pt states the nitroglycerin has helped the pt's pain but that the pt is still in pain.

## 2012-09-30 NOTE — ED Notes (Signed)
Per pt's daughter, pt has been having episodes like this since the winter. Per pt's daughter pt had not seen a doctor for this when it happened back in April. Daughter states the pt has had belly pain that is coming and going.

## 2012-10-01 ENCOUNTER — Encounter (HOSPITAL_COMMUNITY): Payer: Self-pay | Admitting: Radiology

## 2012-10-01 ENCOUNTER — Encounter (HOSPITAL_COMMUNITY)
Admission: EM | Disposition: A | Discharge status: Skilled Nursing Facility | Payer: Self-pay | Source: Home / Self Care | Attending: Internal Medicine

## 2012-10-01 ENCOUNTER — Emergency Department (HOSPITAL_COMMUNITY): Payer: Medicare PPO

## 2012-10-01 HISTORY — PX: ESOPHAGOGASTRODUODENOSCOPY: SHX5428

## 2012-10-01 LAB — COMPREHENSIVE METABOLIC PANEL
ALT: 10 U/L (ref 0–35)
AST: 18 U/L (ref 0–37)
Albumin: 3.5 g/dL (ref 3.5–5.2)
Alkaline Phosphatase: 79 U/L (ref 39–117)
Potassium: 3.5 mEq/L (ref 3.5–5.1)
Sodium: 139 mEq/L (ref 135–145)
Total Protein: 6.3 g/dL (ref 6.0–8.3)

## 2012-10-01 LAB — CBC
HCT: 39.1 % (ref 36.0–46.0)
MCHC: 33.8 g/dL (ref 30.0–36.0)
MCV: 86.5 fL (ref 78.0–100.0)
RDW: 15 % (ref 11.5–15.5)

## 2012-10-01 SURGERY — EGD (ESOPHAGOGASTRODUODENOSCOPY)
Anesthesia: Moderate Sedation

## 2012-10-01 MED ORDER — HYDROCODONE-ACETAMINOPHEN 5-325 MG PO TABS
1.0000 | ORAL_TABLET | ORAL | Status: DC | PRN
  Administered 2012-10-02: 2 via ORAL
  Administered 2012-10-02 (×2): 1 via ORAL
  Filled 2012-10-01: qty 2
  Filled 2012-10-01 (×3): qty 1

## 2012-10-01 MED ORDER — MIDAZOLAM HCL 5 MG/ML IJ SOLN
INTRAMUSCULAR | Status: AC
  Filled 2012-10-01: qty 1

## 2012-10-01 MED ORDER — ALUM & MAG HYDROXIDE-SIMETH 200-200-20 MG/5ML PO SUSP
30.0000 mL | Freq: Four times a day (QID) | ORAL | Status: DC | PRN
  Administered 2012-10-04: 30 mL via ORAL
  Filled 2012-10-01: qty 30

## 2012-10-01 MED ORDER — LOSARTAN POTASSIUM 50 MG PO TABS
50.0000 mg | ORAL_TABLET | Freq: Every day | ORAL | Status: DC
  Administered 2012-10-01 – 2012-10-03 (×3): 50 mg via ORAL
  Filled 2012-10-01 (×5): qty 1

## 2012-10-01 MED ORDER — SODIUM CHLORIDE 0.9 % IJ SOLN
3.0000 mL | Freq: Two times a day (BID) | INTRAMUSCULAR | Status: DC
  Administered 2012-10-01 – 2012-10-05 (×6): 3 mL via INTRAVENOUS

## 2012-10-01 MED ORDER — IOHEXOL 300 MG/ML  SOLN
100.0000 mL | Freq: Once | INTRAMUSCULAR | Status: AC | PRN
  Administered 2012-10-01: 100 mL via INTRAVENOUS

## 2012-10-01 MED ORDER — ACETAMINOPHEN 325 MG PO TABS: 650.0000 mg | ORAL_TABLET | Freq: Four times a day (QID) | ORAL | Status: DC | PRN

## 2012-10-01 MED ORDER — SODIUM CHLORIDE 0.9 % IV SOLN: INTRAVENOUS | Status: DC

## 2012-10-01 MED ORDER — FENTANYL CITRATE 0.05 MG/ML IJ SOLN
INTRAMUSCULAR | Status: AC
  Filled 2012-10-01: qty 2

## 2012-10-01 MED ORDER — POTASSIUM CHLORIDE CRYS ER 10 MEQ PO TBCR
10.0000 meq | EXTENDED_RELEASE_TABLET | Freq: Every day | ORAL | Status: DC
  Administered 2012-10-01 – 2012-10-03 (×3): 10 meq via ORAL
  Filled 2012-10-01 (×5): qty 1

## 2012-10-01 MED ORDER — MIDAZOLAM HCL 10 MG/2ML IJ SOLN
INTRAMUSCULAR | Status: DC | PRN
  Administered 2012-10-01: 2 mg via INTRAVENOUS

## 2012-10-01 MED ORDER — PROMETHAZINE HCL 25 MG/ML IJ SOLN
12.5000 mg | Freq: Once | INTRAMUSCULAR | Status: AC
  Administered 2012-10-01: 12.5 mg via INTRAVENOUS
  Filled 2012-10-01: qty 1

## 2012-10-01 MED ORDER — SODIUM CHLORIDE 0.9 % IV SOLN
INTRAVENOUS | Status: AC
  Administered 2012-10-01: 04:00:00 via INTRAVENOUS

## 2012-10-01 MED ORDER — PANTOPRAZOLE SODIUM 40 MG IV SOLR
40.0000 mg | Freq: Once | INTRAVENOUS | Status: AC
  Administered 2012-10-01: 40 mg via INTRAVENOUS
  Filled 2012-10-01: qty 40

## 2012-10-01 MED ORDER — SODIUM CHLORIDE 0.9 % IV SOLN
8.0000 mg/h | INTRAVENOUS | Status: AC
  Filled 2012-10-01 (×9): qty 80

## 2012-10-01 MED ORDER — POLYETHYLENE GLYCOL 3350 17 G PO PACK
17.0000 g | PACK | Freq: Every day | ORAL | Status: DC | PRN
  Filled 2012-10-01: qty 1

## 2012-10-01 MED ORDER — ZOLPIDEM TARTRATE 5 MG PO TABS: 5.0000 mg | ORAL_TABLET | Freq: Every evening | ORAL | Status: DC | PRN

## 2012-10-01 MED ORDER — POTASSIUM CHLORIDE IN NACL 20-0.9 MEQ/L-% IV SOLN
INTRAVENOUS | Status: DC
  Administered 2012-10-01 – 2012-10-04 (×3): via INTRAVENOUS
  Filled 2012-10-01 (×14): qty 1000

## 2012-10-01 MED ORDER — ONDANSETRON HCL 4 MG/2ML IJ SOLN: 4.0000 mg | Freq: Three times a day (TID) | INTRAMUSCULAR | Status: DC | PRN

## 2012-10-01 MED ORDER — PANTOPRAZOLE SODIUM 40 MG PO TBEC
40.0000 mg | DELAYED_RELEASE_TABLET | Freq: Two times a day (BID) | ORAL | Status: DC
  Administered 2012-10-01 – 2012-10-05 (×7): 40 mg via ORAL
  Filled 2012-10-01 (×7): qty 1

## 2012-10-01 MED ORDER — FENTANYL CITRATE 0.05 MG/ML IJ SOLN
INTRAMUSCULAR | Status: DC | PRN
  Administered 2012-10-01: 25 ug via INTRAVENOUS

## 2012-10-01 MED ORDER — MORPHINE SULFATE 4 MG/ML IJ SOLN
4.0000 mg | Freq: Once | INTRAMUSCULAR | Status: AC
  Administered 2012-10-01: 4 mg via INTRAVENOUS
  Filled 2012-10-01: qty 1

## 2012-10-01 MED ORDER — BUTAMBEN-TETRACAINE-BENZOCAINE 2-2-14 % EX AERO
INHALATION_SPRAY | CUTANEOUS | Status: DC | PRN
  Administered 2012-10-01: 1 via TOPICAL

## 2012-10-01 MED ORDER — ACETAMINOPHEN 650 MG RE SUPP: 650.0000 mg | Freq: Four times a day (QID) | RECTAL | Status: DC | PRN

## 2012-10-01 MED ORDER — HYDROMORPHONE HCL PF 1 MG/ML IJ SOLN: 1.0000 mg | INTRAMUSCULAR | Status: AC | PRN

## 2012-10-01 MED ORDER — ONDANSETRON HCL 4 MG/2ML IJ SOLN
4.0000 mg | Freq: Once | INTRAMUSCULAR | Status: AC
  Administered 2012-10-01: 4 mg via INTRAVENOUS
  Filled 2012-10-01: qty 2

## 2012-10-01 MED ORDER — LORATADINE 10 MG PO TABS
10.0000 mg | ORAL_TABLET | Freq: Every day | ORAL | Status: DC
  Administered 2012-10-02 – 2012-10-03 (×2): 10 mg via ORAL
  Filled 2012-10-01 (×5): qty 1

## 2012-10-01 MED ORDER — GABAPENTIN 600 MG PO TABS
600.0000 mg | ORAL_TABLET | Freq: Every day | ORAL | Status: DC
  Administered 2012-10-01 – 2012-10-03 (×3): 600 mg via ORAL
  Filled 2012-10-01 (×5): qty 1

## 2012-10-01 MED ORDER — ATORVASTATIN CALCIUM 80 MG PO TABS
80.0000 mg | ORAL_TABLET | Freq: Every day | ORAL | Status: DC
  Administered 2012-10-01 – 2012-10-03 (×3): 80 mg via ORAL
  Filled 2012-10-01 (×5): qty 1

## 2012-10-01 MED ORDER — AMLODIPINE BESYLATE 10 MG PO TABS
15.0000 mg | ORAL_TABLET | Freq: Every day | ORAL | Status: DC
  Administered 2012-10-01 – 2012-10-03 (×3): 15 mg via ORAL
  Filled 2012-10-01 (×5): qty 1

## 2012-10-01 NOTE — ED Notes (Signed)
CT called to inform that pt is ready to go for testing.

## 2012-10-01 NOTE — ED Notes (Signed)
MD at bedside. 

## 2012-10-01 NOTE — ED Notes (Signed)
Pt requesting more anti-nausea medications, MD informed. See new orders.

## 2012-10-01 NOTE — ED Provider Notes (Signed)
Late entry: care assumed from Dr. Patria Mane at change of shift. Pending CT for abdominal pain.  CT remarkable for gastritis and duodenitis with possible ulcer.  Abdomen remains soft without peritoneal signs. Patient has required significant doses of nausea and pain medication. Admission for symptom control and EGD d/w Dr. Clelia Croft who will have Dr. Wylene Simmer see patient this morning.  Breanna Octave, MD 10/01/12 2240

## 2012-10-01 NOTE — Progress Notes (Signed)
Utilization Review Completed.   Debora Stockdale, RN, BSN Nurse Case Manager  [phone]  

## 2012-10-01 NOTE — H&P (Signed)
PCP:   Gaspar Garbe, MD   Chief Complaint:  Abdominal pain  HPI: Patient is a 78 year old femal who last had colonoscopy in 2012 per Madilyn Fireman.  Does not see GI routinely.  Reports worsening abdominal pain over the past 24-36 hours. She reports she feels better if she lays on her left side. The pain is mainly in her upper abdomen and the right side of her abdomen. Nausea and is vomiting in the emergency department. The pain radiates up into her chest. She reports decreased oral intake today. She's never had pain like this before. Underwent ultrasound and abd CT which showed gastritis and duodenitis.  Due to the level of her pain, was given IV pain meds and I was asked to admit her this AM.  Of note, when she was seen in May for her yearly physical, she complained of some mild gastritis issues, was given samples of Dexilant and notes that it worked very well and resolved.  She was not taking PPI in the past month due to not having symptoms since May.  Review of Systems:  Review of Systems - Negative except abdominal pain as noted above  Past Medical History: Past Medical History  Diagnosis Date  . Diabetes mellitus   . Hyperlipidemia   . Hypertension   . Sleep apnea     AHI 89/hr  . Allergic rhinitis   . COPD (chronic obstructive pulmonary disease)     PFT 08/08/09-FEV1 1.98/107; R 0.77; small airway obst w/tresp to dilator;DLCO 44%  . Fibromuscular dysplasia     RAS   Past Surgical History  Procedure Laterality Date  . Pituitary tumor removed      x 2  . Back surgery  2005  . Tonsillectomy    . Nasal septal deviation    . Bilateral renal bypass      Medications: Prior to Admission medications   Medication Sig Start Date End Date Taking? Authorizing Provider    05/17/11  Yes Historical Provider, MD  amLODipine (NORVASC) 10 MG tablet Take 15 mg by mouth daily.     Yes Historical Provider, MD  aspirin 81 MG tablet ON HOLD   Yes Historical Provider, MD  atorvastatin (LIPITOR)  80 MG tablet Take 80 mg by mouth daily.     Yes Historical Provider, MD  Calcium Carbonate-Vit D-Min (CALCIUM 1200 PO) Take 1 capsule by mouth daily.    Yes Historical Provider, MD  clobetasol ointment (TEMOVATE) 0.05 % as needed.   Yes Historical Provider, MD      Yes Historical Provider, MD  fexofenadine (ALLEGRA) 180 MG tablet Take 180 mg by mouth daily.   Yes Historical Provider, MD  gabapentin (NEURONTIN) 600 MG tablet Take 600 mg by mouth daily.     Yes Historical Provider, MD  losartan (COZAAR) 50 MG tablet Take 1 tablet by mouth daily. 01/14/11  Yes Historical Provider, MD  potassium chloride (KLOR-CON) 10 MEQ CR tablet Take 10 mEq by mouth daily.     Yes Historical Provider, MD    Allergies:   Allergies  Allergen Reactions  . Valacyclovir Hcl     Social History:  reports that she quit smoking about 15 years ago. Her smoking use included Cigarettes. She has a 35 pack-year smoking history. She has never used smokeless tobacco. She reports that she does not drink alcohol or use illicit drugs.  Family History: Family History  Problem Relation Age of Onset  . Stroke Father   . Breast cancer Mother  Physical Exam: Filed Vitals:   10/01/12 0330 10/01/12 0336 10/01/12 0400 10/01/12 0535  BP: 136/62 136/62 134/70 117/50  Pulse: 79 82 80 71  Temp:    97.5 F (36.4 C)  TempSrc:    Oral  Resp: 14 14 14 16   Height:    5\' 1"  (1.549 m)  Weight:    69.446 kg (153 lb 1.6 oz)  SpO2: 92% 97% 98% 100%   General appearance: alert, cooperative and appears stated age Head: Normocephalic, without obvious abnormality, atraumatic Eyes: conjunctivae/corneas clear. PERRL, EOM's intact.  Nose: Nares normal. Septum midline. Mucosa normal. No drainage or sinus tenderness. Throat: lips, mucosa, and tongue normal; teeth and gums normal Neck: no adenopathy, no carotid bruit, no JVD and thyroid not enlarged, symmetric, no tenderness/mass/nodules Resp: clear to auscultation bilaterally Cardio:  regular rate and rhythm, S1, S2 normal, no murmur, click, rub or gallop GI: hyperactive, L upper quadrant pain without rebound Extremities: extremities normal, atraumatic, no cyanosis or edema Pulses: 2+ and symmetric Lymph nodes: Cervical adenopathy: no cervical lymphadenopathy Neurologic: Alert and oriented X 3, normal strength and tone. Normal symmetric reflexes.     Labs on Admission:   Recent Labs  09/30/12 2023  NA 135  K 4.4  CL 96  CO2 20  GLUCOSE 91  BUN 17  CREATININE 0.78  CALCIUM 10.1    Recent Labs  09/30/12 2023  AST 32  ALT 13  ALKPHOS 86  BILITOT 0.9  PROT 7.8  ALBUMIN 4.0    Recent Labs  09/30/12 2023  LIPASE 41    Recent Labs  09/30/12 2014  WBC 9.7  HGB 13.2  HCT 39.0  MCV 84.8  PLT 282    Recent Labs  09/30/12 2024  TROPONINI <0.30   Lab Results  Component Value Date   INR 0.9 06/24/2011    Radiological Exams on Admission: Dg Chest 2 View  09/30/2012   *RADIOLOGY REPORT*  Clinical Data: Chest pain  CHEST - 2 VIEW  Comparison: 01/29/2011  Findings: The heart and pulmonary vascularity are within normal limits.  The lungs are clear bilaterally.  There are changes consistent with prior vertebral augmentation.  No acute abnormality is seen.  IMPRESSION: No acute abnormality noted.   Original Report Authenticated By: Alcide Clever, M.D.   US Abdomen Complete  09/30/2012   *RADIOLOGY REPORT*  Clinical Data:  Chest pain  ABDOMINAL ULTRASOUND COMPLETE  Comparison:  None.  Findings:  Gallbladder:  No gallstones, gallbladder wall thickening, or pericholecystic fluid.  Common Bile Duct:  Within normal limits in caliber.  Liver: No focal mass lesion identified.  Within normal limits in parenchymal echogenicity.  IVC:  Appears normal.  Pancreas:  No abnormality identified.  Spleen:  Within normal limits in size and echotexture.  Right kidney:  8.7 cm.  Echogenic with renal cortical thinning but no hydronephrosis.  Left kidney:  10.4 cm.  Echogenic  with renal cortical thinning but no hydronephrosis.  Abdominal Aorta:  No aneurysm.  The sonographer questioned bowel wall thickening at the level of the colonic hepatic flexure measuring 9 mm, but this segment appears collapsed and this finding is of unknown clinical significance.  IMPRESSION: Small echogenic kidneys likely indicating chronic medical renal disease.  No acute finding.  Possible hepatic flexure colonic wall thickening but artifactual thickening related to decompression is possible.   Original Report Authenticated By: Christiana Pellant, M.D.   Ct Abdomen Pelvis W Contrast  10/01/2012   *RADIOLOGY REPORT*  Clinical Data: Right upper  quadrant and right-sided abdominal pain.  CT ABDOMEN AND PELVIS WITH CONTRAST  Technique:  Multidetector CT imaging of the abdomen and pelvis was performed following the standard protocol during bolus administration of intravenous contrast.  Contrast: OMNIPAQUE IOHEXOL 300 MG/ML  SOLN  Comparison: No priors.  Findings:  Lung Bases: Mild scarring throughout the lung bases bilaterally.  Abdomen/Pelvis:  There is some thickening of the antral prepyloric region of the stomach as well as some thickening of the first and second portions of the duodenum.  Notably, there are inflammatory changes adjacent to the second portion of the duodenum, suggestive of duodenitis. Image of 33 of series 5 demonstrates a potential focal outpouching of the duodenum in the region of the proximal second portion, concerning for potential ulcer.  The appearance of the liver, gallbladder, pancreas, spleen and right adrenal gland are unremarkable.  There is a 1.4 cm nodule in the lateral limb of the left adrenal gland which is indeterminate. Mild bilateral renal atrophy with multi focal cortical thinning.  Extensive atherosclerosis of the abdominal aorta and pelvic vasculature.  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  There is a bypass graft extending from the right common iliac artery to the  right kidney.  Mildly fusiform ectasia of the infrarenal abdominal aorta is noted, measuring up to 2.8 x 2.6 cm.  Numerous colonic diverticula are noted, without surrounding inflammatory changes to suggest acute diverticulitis at this time.  No significant volume of ascites.  No pneumoperitoneum.  No pathologic distension of small bowel.  The appendix is not visualized and is likely surgically absent.  No inflammatory changes are noted adjacent to the cecum to suggest acute appendicitis at this time.  No definite pathologic lymphadenopathy identified within the abdomen or pelvis. Uterus and ovaries are atrophic.  Urinary bladder is normal in appearance.  Musculoskeletal: Post vertebroplasty changes are noted at T11 where there is approximately 20% loss of anterior vertebral body height. 4 mm of anterolisthesis of L5 upon S1.  Alignment is otherwise anatomic. There are no aggressive appearing lytic or blastic lesions noted in the visualized portions of the skeleton.  IMPRESSION: 1.  Findings, as above, concerning for distal gastritis and duodenitis. Image 33 of series 5 demonstrates a potential ulcer in the lateral wall of the proximal second portion of the duodenum. 2.  No evidence of gallstones or findings to suggest acute cholecystitis at this time. 3.  Colonic diverticulosis without findings to suggest acute diverticulitis at this time. 4.  Extensive atherosclerosis, status post right renal artery bypass graft placement (extending from the proximal common iliac artery on the right), as above. 5.  Additional incidental findings, as above.   Original Report Authenticated By: Trudie Reed, M.D.   Orders placed in visit on 08/14/11  . EKG 12-LEAD    Assessment/Plan Gastritis and duodenitis-   GIven initial IV protonix in ER, but will change to BID orally as no active bleeding.  WIll have GI Deboraha Sprang) see her and consider EGD, as they performed her colonoscopy in 2012 Madilyn Fireman).  H pylori blood antibody has been  ordered this AM and her ASA and NSAIDs have been d/ced from her med list.  HTN: Controlled on current 2 drug regimen  Hyperlipidemia: Continue Statin  IGT;  Had normal A1C at her [facility] 3 months ago and had lost weight to control.  Does not require further inpatient management.  OSA  CPAP  COPD:  She refuses inhalers as outpatient, will add albuterol as needed.  Followed  by Fannie Knee on a yearly basis.  Fibromuscular dysplasia:  No change in renal indices from baseline     Floretta Petro W 10/01/2012, 6:31 AM

## 2012-10-01 NOTE — Progress Notes (Signed)
Pt is alert and oriented x4. Pt is currently in ENDO for an EGD. Pt has been complaining of nausea this AM. Pt is on RA. Pt is up with 1 assist. Will continue to monitor

## 2012-10-01 NOTE — Consult Note (Signed)
[facility] Consult Note  Referring Provider: No ref. provider found Primary Care Physician:  Gaspar Garbe, MD Primary Gastroenterologist:  Dr.  Antony Contras Complaint: Abdominal pain HPI: [name] is an 78 y.o. white female  who I have seen in the past for routine screening colonoscopy. He presents with a 2 day history of worsening abdominal pain particularly in the epigastrium and right per quadrant with some nausea and vomiting. CT scan showed marked duodenitis suspicion of peptic ulcer. The patient does take the NSAID Lodine. Her hemoglobin liver function tests and lipase were all normal.  Past Medical History  Diagnosis Date  . Diabetes mellitus   . Hyperlipidemia   . Hypertension   . Sleep apnea     AHI 89/hr  . Allergic rhinitis   . COPD (chronic obstructive pulmonary disease)     PFT 08/08/09-FEV1 1.98/107; R 0.77; small airway obst w/tresp to dilator;DLCO 44%  . Fibromuscular dysplasia     RAS    Past Surgical History  Procedure Laterality Date  . Pituitary tumor removed      x 2  . Back surgery  2005  . Tonsillectomy    . Nasal septal deviation    . Bilateral renal bypass      Medications Prior to Admission  Medication Sig Dispense Refill  . ACCU-CHEK AVIVA PLUS test strip       . amLODipine (NORVASC) 10 MG tablet Take 15 mg by mouth daily.        Marland Kitchen aspirin 81 MG tablet ON HOLD      . atorvastatin (LIPITOR) 80 MG tablet Take 80 mg by mouth daily.        . Calcium Carbonate-Vit D-Min (CALCIUM 1200 PO) Take 1 capsule by mouth daily.       . clobetasol ointment (TEMOVATE) 0.05 % as needed.      . etodolac (LODINE) 500 MG tablet Take 1 tablet by mouth Twice daily.      . fexofenadine (ALLEGRA) 180 MG tablet Take 180 mg by mouth daily.      Marland Kitchen gabapentin (NEURONTIN) 600 MG tablet Take 600 mg by mouth daily.        Marland Kitchen losartan (COZAAR) 50 MG tablet Take 1 tablet by mouth daily.      . potassium chloride (KLOR-CON) 10 MEQ CR tablet Take 10 mEq by  mouth daily.          Allergies:  Allergies  Allergen Reactions  . Valacyclovir Hcl     Family History  Problem Relation Age of Onset  . Stroke Father   . Breast cancer Mother     Social History:  reports that she quit smoking about 15 years ago. Her smoking use included Cigarettes. She has a 35 pack-year smoking history. She has never used smokeless tobacco. She reports that she does not drink alcohol or use illicit drugs.  Review of Systems: negative except as above   Blood pressure 117/50, pulse 71, temperature 97.5 F (36.4 C), temperature source Oral, resp. rate 16, height 5\' 1"  (1.549 m), weight 69.446 kg (153 lb 1.6 oz), SpO2 100.00%. Head: Normocephalic, without obvious abnormality, atraumatic Neck: no adenopathy, no carotid bruit, no JVD, supple, symmetrical, trachea midline and thyroid not enlarged, symmetric, no tenderness/mass/nodules Resp: clear to auscultation bilaterally Cardio: regular rate and rhythm, S1, S2 normal, no murmur, click, rub or gallop GI: Abdomen soft moderately tender diffusely especially in the upper abdomen Extremities: extremities normal, atraumatic, no cyanosis or edema  Results for orders  placed during the hospital encounter of 09/30/12 (from the past 48 hour(s))  CBC     Status: None   Collection Time    09/30/12  8:14 PM      Result Value Range   WBC 9.7  4.0 - 10.5 K/uL   RBC 4.60  3.87 - 5.11 MIL/uL   Hemoglobin 13.2  12.0 - 15.0 g/dL   HCT 16.1  09.6 - 04.5 %   MCV 84.8  78.0 - 100.0 fL   MCH 28.7  26.0 - 34.0 pg   MCHC 33.8  30.0 - 36.0 g/dL   RDW 40.9  81.1 - 91.4 %   Platelets 282  150 - 400 K/uL  COMPREHENSIVE METABOLIC PANEL     Status: Abnormal   Collection Time    09/30/12  8:23 PM      Result Value Range   Sodium 135  135 - 145 mEq/L   Potassium 4.4  3.5 - 5.1 mEq/L   Comment: HEMOLYSIS AT THIS LEVEL MAY AFFECT RESULT   Chloride 96  96 - 112 mEq/L   CO2 20  19 - 32 mEq/L   Glucose, Bld 91  70 - 99 mg/dL   BUN 17  6  - 23 mg/dL   Creatinine, Ser 7.82  0.50 - 1.10 mg/dL   Calcium 95.6  8.4 - 21.3 mg/dL   Total Protein 7.8  6.0 - 8.3 g/dL   Albumin 4.0  3.5 - 5.2 g/dL   AST 32  0 - 37 U/L   ALT 13  0 - 35 U/L   Alkaline Phosphatase 86  39 - 117 U/L   Total Bilirubin 0.9  0.3 - 1.2 mg/dL   GFR calc non Af Amer 79 (*) >90 mL/min   GFR calc Af Amer >90  >90 mL/min   Comment: (NOTE)     The eGFR has been calculated using the CKD EPI equation.     This calculation has not been validated in all clinical situations.     eGFR's persistently <90 mL/min signify possible Chronic Kidney     Disease.  LIPASE, BLOOD     Status: None   Collection Time    09/30/12  8:23 PM      Result Value Range   Lipase 41  11 - 59 U/L  TROPONIN I     Status: None   Collection Time    09/30/12  8:24 PM      Result Value Range   Troponin I <0.30  <0.30 ng/mL   Comment:            Due to the release kinetics of cTnI,     a negative result within the first hours     of the onset of symptoms does not rule out     myocardial infarction with certainty.     If myocardial infarction is still suspected,     repeat the test at appropriate intervals.  POCT I-STAT TROPONIN I     Status: None   Collection Time    09/30/12  8:33 PM      Result Value Range   Troponin i, poc 0.02  0.00 - 0.08 ng/mL   Comment 3            Comment: Due to the release kinetics of cTnI,     a negative result within the first hours     of the onset of symptoms does not rule out     myocardial  infarction with certainty.     If myocardial infarction is still suspected,     repeat the test at appropriate intervals.   Dg Chest 2 View  09/30/2012   *RADIOLOGY REPORT*  Clinical Data: Chest pain  CHEST - 2 VIEW  Comparison: 01/29/2011  Findings: The heart and pulmonary vascularity are within normal limits.  The lungs are clear bilaterally.  There are changes consistent with prior vertebral augmentation.  No acute abnormality is seen.  IMPRESSION: No acute  abnormality noted.   Original Report Authenticated By: Alcide Clever, M.D.   US Abdomen Complete  09/30/2012   *RADIOLOGY REPORT*  Clinical Data:  Chest pain  ABDOMINAL ULTRASOUND COMPLETE  Comparison:  None.  Findings:  Gallbladder:  No gallstones, gallbladder wall thickening, or pericholecystic fluid.  Common Bile Duct:  Within normal limits in caliber.  Liver: No focal mass lesion identified.  Within normal limits in parenchymal echogenicity.  IVC:  Appears normal.  Pancreas:  No abnormality identified.  Spleen:  Within normal limits in size and echotexture.  Right kidney:  8.7 cm.  Echogenic with renal cortical thinning but no hydronephrosis.  Left kidney:  10.4 cm.  Echogenic with renal cortical thinning but no hydronephrosis.  Abdominal Aorta:  No aneurysm.  The sonographer questioned bowel wall thickening at the level of the colonic hepatic flexure measuring 9 mm, but this segment appears collapsed and this finding is of unknown clinical significance.  IMPRESSION: Small echogenic kidneys likely indicating chronic medical renal disease.  No acute finding.  Possible hepatic flexure colonic wall thickening but artifactual thickening related to decompression is possible.   Original Report Authenticated By: Christiana Pellant, M.D.   Ct Abdomen Pelvis W Contrast  10/01/2012   *RADIOLOGY REPORT*  Clinical Data: Right upper quadrant and right-sided abdominal pain.  CT ABDOMEN AND PELVIS WITH CONTRAST  Technique:  Multidetector CT imaging of the abdomen and pelvis was performed following the standard protocol during bolus administration of intravenous contrast.  Contrast: OMNIPAQUE IOHEXOL 300 MG/ML  SOLN  Comparison: No priors.  Findings:  Lung Bases: Mild scarring throughout the lung bases bilaterally.  Abdomen/Pelvis:  There is some thickening of the antral prepyloric region of the stomach as well as some thickening of the first and second portions of the duodenum.  Notably, there are inflammatory changes  adjacent to the second portion of the duodenum, suggestive of duodenitis. Image of 33 of series 5 demonstrates a potential focal outpouching of the duodenum in the region of the proximal second portion, concerning for potential ulcer.  The appearance of the liver, gallbladder, pancreas, spleen and right adrenal gland are unremarkable.  There is a 1.4 cm nodule in the lateral limb of the left adrenal gland which is indeterminate. Mild bilateral renal atrophy with multi focal cortical thinning.  Extensive atherosclerosis of the abdominal aorta and pelvic vasculature.  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  There is a bypass graft extending from the right common iliac artery to the right kidney.  Mildly fusiform ectasia of the infrarenal abdominal aorta is noted, measuring up to 2.8 x 2.6 cm.  Numerous colonic diverticula are noted, without surrounding inflammatory changes to suggest acute diverticulitis at this time.  No significant volume of ascites.  No pneumoperitoneum.  No pathologic distension of small bowel.  The appendix is not visualized and is likely surgically absent.  No inflammatory changes are noted adjacent to the cecum to suggest acute appendicitis at this time.  No definite pathologic lymphadenopathy identified  within the abdomen or pelvis. Uterus and ovaries are atrophic.  Urinary bladder is normal in appearance.  Musculoskeletal: Post vertebroplasty changes are noted at T11 where there is approximately 20% loss of anterior vertebral body height. 4 mm of anterolisthesis of L5 upon S1.  Alignment is otherwise anatomic. There are no aggressive appearing lytic or blastic lesions noted in the visualized portions of the skeleton.  IMPRESSION: 1.  Findings, as above, concerning for distal gastritis and duodenitis. Image 33 of series 5 demonstrates a potential ulcer in the lateral wall of the proximal second portion of the duodenum. 2.  No evidence of gallstones or findings to suggest acute cholecystitis  at this time. 3.  Colonic diverticulosis without findings to suggest acute diverticulitis at this time. 4.  Extensive atherosclerosis, status post right renal artery bypass graft placement (extending from the proximal common iliac artery on the right), as above. 5.  Additional incidental findings, as above.   Original Report Authenticated By: Trudie Reed, M.D.    Assessment: Worsening acute dyspepsia with pyloric or duodenal ulcer or inflammation suggested. Plan:  Will proceed with EGD at this time. Shyasia Funches C 10/01/2012, 9:29 AM

## 2012-10-01 NOTE — Progress Notes (Signed)
Admitted pt from ED per stretcher, AOx4, denies any chest pain, denies any abdominal pain, not in respiratory distress, no neuro deficit noted. Weighed and VS taken, put on telemetry monitoring. Oriented to room and call bell. Dr. Clelia Croft was aware of this admission and verbalize to progress diet to clear liquids from NPO. Will monitor pt

## 2012-10-01 NOTE — Op Note (Addendum)
[facility] [address] [place], [address]   ENDOSCOPY PROCEDURE REPORT  PATIENT: Breanna, Castillo  MR#: [id number] BIRTHDATE: 1934/12/02 , 78  yrs. old GENDER: Female ENDOSCOPIST:Zakariyah Freimark Madilyn Fireman, MD REFERRED BY: PROCEDURE DATE:  10/01/2012 PROCEDURE: ASA CLASS: INDICATIONS:   epigastric abdominal pain nausea and vomiting and abnormal duodenum on CT scan. MEDICATION:   fentanyl 25 mcg Versed 2 mg TOPICAL ANESTHETIC:    Cetacaine spray  DESCRIPTION OF PROCEDURE:   esophagus, small hiatal hernia Stomach: Normal Duodenum: Very deep large ulcer at the very beginning of the second portion difficult to visualize in 1 image with a dirty base not obstructing passage of the scope beyond it. No active bleeding or discrete visible vessel. Scope was drawn back into the stomach and a CLOtest obtained.     COMPLICATIONS: None  ENDOSCOPIC IMPRESSION:large deep duodenal ulcer  RECOMMENDATIONS:await CLOtest and treat for eradication of Helicobacter if positive PPI drip Consider Carafate Would not advance beyond clear liquids for now I would definitely re endoscoped this patient for followup of healing in 6-8 weeks if does not develop complication require earlier intervention.    _______________________________ Rosalie DoctorDorena Cookey, MD 10/01/2012 12:42 PM    PATIENT NAME:  Breanna, Castillo MR#: [id number]

## 2012-10-01 NOTE — ED Notes (Addendum)
Daughter Vernona Rieger: [phone]   Please call with updates.

## 2012-10-01 NOTE — ED Notes (Addendum)
Pt unable to tolerate liquid contrast. MD informed. MD wants to send pt to radiology without her drinking anymore at this time.

## 2012-10-02 LAB — COMPREHENSIVE METABOLIC PANEL
ALT: 9 U/L (ref 0–35)
AST: 17 U/L (ref 0–37)
Alkaline Phosphatase: 67 U/L (ref 39–117)
CO2: 23 mEq/L (ref 19–32)
Calcium: 8.5 mg/dL (ref 8.4–10.5)
GFR calc Af Amer: 62 mL/min — ABNORMAL LOW (ref >90)
GFR calc non Af Amer: 53 mL/min — ABNORMAL LOW (ref >90)
Glucose, Bld: 121 mg/dL — ABNORMAL HIGH (ref 70–99)
Potassium: 3.7 mEq/L (ref 3.5–5.1)
Sodium: 138 mEq/L (ref 135–145)
Total Protein: 5.5 g/dL — ABNORMAL LOW (ref 6.0–8.3)

## 2012-10-02 LAB — CBC
HCT: 34.2 % — ABNORMAL LOW (ref 36.0–46.0)
Hemoglobin: 11.3 g/dL — ABNORMAL LOW (ref 12.0–15.0)
MCH: 29 pg (ref 26.0–34.0)
MCHC: 33 g/dL (ref 30.0–36.0)
RDW: 15.2 % (ref 11.5–15.5)

## 2012-10-02 LAB — CLOTEST (H. PYLORI), BIOPSY: Helicobacter screen: NEGATIVE

## 2012-10-02 MED ORDER — TRAMADOL HCL 50 MG PO TABS
50.0000 mg | ORAL_TABLET | Freq: Two times a day (BID) | ORAL | Status: DC
  Administered 2012-10-02 – 2012-10-05 (×5): 50 mg via ORAL
  Filled 2012-10-02 (×6): qty 1

## 2012-10-02 NOTE — Progress Notes (Signed)
Rounding given pt resting comfortable on bed, no c/o pain, nausea or discomfort at this time. Pt is on 2 L O2 [place] no distress noticed. We'll continue with POC.

## 2012-10-02 NOTE — Progress Notes (Signed)
Subjective: More knee pain. Much worse off etodolac. No abd pain. Not mobile at all yet. Breathing OK. Some throat soreness post procedure  Objective: Vital signs in last 24 hours: Temp:  [98.1 F (36.7 C)-98.7 F (37.1 C)] 98.5 F (36.9 C) (08/16 0536) Pulse Rate:  [54-84] 84 (08/16 0536) Resp:  [10-25] 18 (08/16 0536) BP: (80-132)/(31-59) 119/51 mmHg (08/16 0536) SpO2:  [93 %-100 %] 97 % (08/16 0536) Weight:  [71.079 kg (156 lb 11.2 oz)] 71.079 kg (156 lb 11.2 oz) (08/16 0536)  Intake/Output from previous day: 08/15 0701 - 08/16 0700 In: 798 [P.O.:798] Out: 550 [Urine:550] Intake/Output this shift: Total I/O In: 240 [P.O.:240] Out: -   WF lying nearly flat in no distress, O2 in place. Lungs distant. Ht regular with sys murmur. abd soft NT, good BS"s. Extensive OA changes of knees. Awake, alert a bit anxious  Lab Results   Recent Labs  10/01/12 0933 10/02/12 0700  WBC 6.4 8.4  RBC 4.52 3.89  HGB 13.2 11.3*  HCT 39.1 34.2*  MCV 86.5 87.9  MCH 29.2 29.0  RDW 15.0 15.2  PLT 278 268    Recent Labs  10/01/12 0933 10/02/12 0700  NA 139 138  K 3.5 3.7  CL 102 107  CO2 26 23  GLUCOSE 92 121*  BUN 20 19  CREATININE 1.00 1.00  CALCIUM 9.0 8.5    Studies/Results: Dg Chest 2 View  09/30/2012   *RADIOLOGY REPORT*  Clinical Data: Chest pain  CHEST - 2 VIEW  Comparison: 01/29/2011  Findings: The heart and pulmonary vascularity are within normal limits.  The lungs are clear bilaterally.  There are changes consistent with prior vertebral augmentation.  No acute abnormality is seen.  IMPRESSION: No acute abnormality noted.   Original Report Authenticated By: Alcide Clever, M.D.   US Abdomen Complete  09/30/2012   *RADIOLOGY REPORT*  Clinical Data:  Chest pain  ABDOMINAL ULTRASOUND COMPLETE  Comparison:  None.  Findings:  Gallbladder:  No gallstones, gallbladder wall thickening, or pericholecystic fluid.  Common Bile Duct:  Within normal limits in caliber.  Liver: No focal  mass lesion identified.  Within normal limits in parenchymal echogenicity.  IVC:  Appears normal.  Pancreas:  No abnormality identified.  Spleen:  Within normal limits in size and echotexture.  Right kidney:  8.7 cm.  Echogenic with renal cortical thinning but no hydronephrosis.  Left kidney:  10.4 cm.  Echogenic with renal cortical thinning but no hydronephrosis.  Abdominal Aorta:  No aneurysm.  The sonographer questioned bowel wall thickening at the level of the colonic hepatic flexure measuring 9 mm, but this segment appears collapsed and this finding is of unknown clinical significance.  IMPRESSION: Small echogenic kidneys likely indicating chronic medical renal disease.  No acute finding.  Possible hepatic flexure colonic wall thickening but artifactual thickening related to decompression is possible.   Original Report Authenticated By: Christiana Pellant, M.D.   Ct Abdomen Pelvis W Contrast  10/01/2012   *RADIOLOGY REPORT*  Clinical Data: Right upper quadrant and right-sided abdominal pain.  CT ABDOMEN AND PELVIS WITH CONTRAST  Technique:  Multidetector CT imaging of the abdomen and pelvis was performed following the standard protocol during bolus administration of intravenous contrast.  Contrast: OMNIPAQUE IOHEXOL 300 MG/ML  SOLN  Comparison: No priors.  Findings:  Lung Bases: Mild scarring throughout the lung bases bilaterally.  Abdomen/Pelvis:  There is some thickening of the antral prepyloric region of the stomach as well as some thickening of the  first and second portions of the duodenum.  Notably, there are inflammatory changes adjacent to the second portion of the duodenum, suggestive of duodenitis. Image of 33 of series 5 demonstrates a potential focal outpouching of the duodenum in the region of the proximal second portion, concerning for potential ulcer.  The appearance of the liver, gallbladder, pancreas, spleen and right adrenal gland are unremarkable.  There is a 1.4 cm nodule in the lateral  limb of the left adrenal gland which is indeterminate. Mild bilateral renal atrophy with multi focal cortical thinning.  Extensive atherosclerosis of the abdominal aorta and pelvic vasculature.  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  There is a bypass graft extending from the right common iliac artery to the right kidney.  Mildly fusiform ectasia of the infrarenal abdominal aorta is noted, measuring up to 2.8 x 2.6 cm.  Numerous colonic diverticula are noted, without surrounding inflammatory changes to suggest acute diverticulitis at this time.  No significant volume of ascites.  No pneumoperitoneum.  No pathologic distension of small bowel.  The appendix is not visualized and is likely surgically absent.  No inflammatory changes are noted adjacent to the cecum to suggest acute appendicitis at this time.  No definite pathologic lymphadenopathy identified within the abdomen or pelvis. Uterus and ovaries are atrophic.  Urinary bladder is normal in appearance.  Musculoskeletal: Post vertebroplasty changes are noted at T11 where there is approximately 20% loss of anterior vertebral body height. 4 mm of anterolisthesis of L5 upon S1.  Alignment is otherwise anatomic. There are no aggressive appearing lytic or blastic lesions noted in the visualized portions of the skeleton.  IMPRESSION: 1.  Findings, as above, concerning for distal gastritis and duodenitis. Image 33 of series 5 demonstrates a potential ulcer in the lateral wall of the proximal second portion of the duodenum. 2.  No evidence of gallstones or findings to suggest acute cholecystitis at this time. 3.  Colonic diverticulosis without findings to suggest acute diverticulitis at this time. 4.  Extensive atherosclerosis, status post right renal artery bypass graft placement (extending from the proximal common iliac artery on the right), as above. 5.  Additional incidental findings, as above.   Original Report Authenticated By: Trudie Reed, M.D.     Scheduled Meds: . amLODipine  15 mg Oral Daily  . atorvastatin  80 mg Oral Daily  . gabapentin  600 mg Oral Daily  . loratadine  10 mg Oral Daily  . losartan  50 mg Oral Daily  . pantoprazole  40 mg Oral BID  . potassium chloride  10 mEq Oral Daily  . sodium chloride  3 mL Intravenous Q12H   Continuous Infusions: . 0.9 % NaCl with KCl 20 mEq / L 75 mL/hr at 10/01/12 0854  . pantoprozole (PROTONIX) infusion     PRN Meds:acetaminophen, acetaminophen, alum & mag hydroxide-simeth, HYDROcodone-acetaminophen, polyethylene glycol, zolpidem  Assessment/Plan: DUODENAL ULCER; GI note reviewed. On BID PPI. Hgb noted to be lower today @11 .3. Check in AM HYPERTENSION: BP dipped yesterday. Now back up. May need reduced RX COPD: Stable on supplemental O2 DM 2: FBS good at 121 DM NEUROPATHY: on rx HYPERLIPIDEMIA: on Rx SLEEP APNEA: stable OSTEOPOROSIS WITH VERTEBROPLASTY;  stable DISPOSTION; diet is being advanced, possible D/C tomorrow but need to work on mobility   LOS: 2 days   [name] ALAN 10/02/2012, 10:20 AM

## 2012-10-02 NOTE — Progress Notes (Signed)
Pt assessment completed, VSS pt is drowsy but arousable, daughter at the bedside, refers this is normal for her to get drowsy at times during the day, pt c/o some discomfort on her knees Tramadol given at this time, pt refers she still get nauseous with food.  Daughter refers pt may need some home health help and a hospital bed at dc. We'll continue with POC.

## 2012-10-02 NOTE — Progress Notes (Signed)
[facility] PROGRESS NOTE Subjective patient complaining of pain in her knees since the lodine was stopped. No gross bleeding  Objective: Vital signs in last 24 hours: Temp:  [98.1 F (36.7 C)-98.7 F (37.1 C)] 98.5 F (36.9 C) (08/16 0536) Pulse Rate:  [54-84] 84 (08/16 0536) Resp:  [10-25] 18 (08/16 0536) BP: (80-132)/(31-59) 119/51 mmHg (08/16 0536) SpO2:  [93 %-100 %] 97 % (08/16 0536) Weight:  [71.079 kg (156 lb 11.2 oz)] 71.079 kg (156 lb 11.2 oz) (08/16 0536) Last BM Date: 09/30/12  Intake/Output from previous day: 08/15 0701 - 08/16 0700 In: 798 [P.O.:798] Out: 550 [Urine:550] Intake/Output this shift: Total I/O In: 240 [P.O.:240] Out: -   PE: General-- reading the paper no acute distress  Abdomen-- none distended and soft, nontender  Lab Results:  Recent Labs  09/30/12 2014 10/01/12 0933 10/02/12 0700  WBC 9.7 6.4 8.4  HGB 13.2 13.2 11.3*  HCT 39.0 39.1 34.2*  PLT 282 278 268   BMET  Recent Labs  09/30/12 2023 10/01/12 0933 10/02/12 0700  NA 135 139 138  K 4.4 3.5 3.7  CL 96 102 107  CO2 20 26 23   CREATININE 0.78 1.00 1.00   LFT  Recent Labs  09/30/12 2023 10/01/12 0933 10/02/12 0700  PROT 7.8 6.3 5.5*  AST 32 18 17  ALT 13 10 9   ALKPHOS 86 79 67  BILITOT 0.9 0.6 0.5   PT/INR No results found for this basename: LABPROT, INR,  in the last 72 hours PANCREAS  Recent Labs  09/30/12 2023 10/01/12 0933  LIPASE 41 51         Studies/Results: Dg Chest 2 View  09/30/2012   *RADIOLOGY REPORT*  Clinical Data: Chest pain  CHEST - 2 VIEW  Comparison: 01/29/2011  Findings: The heart and pulmonary vascularity are within normal limits.  The lungs are clear bilaterally.  There are changes consistent with prior vertebral augmentation.  No acute abnormality is seen.  IMPRESSION: No acute abnormality noted.   Original Report Authenticated By: Alcide Clever, M.D.   US Abdomen Complete  09/30/2012   *RADIOLOGY REPORT*  Clinical  Data:  Chest pain  ABDOMINAL ULTRASOUND COMPLETE  Comparison:  None.  Findings:  Gallbladder:  No gallstones, gallbladder wall thickening, or pericholecystic fluid.  Common Bile Duct:  Within normal limits in caliber.  Liver: No focal mass lesion identified.  Within normal limits in parenchymal echogenicity.  IVC:  Appears normal.  Pancreas:  No abnormality identified.  Spleen:  Within normal limits in size and echotexture.  Right kidney:  8.7 cm.  Echogenic with renal cortical thinning but no hydronephrosis.  Left kidney:  10.4 cm.  Echogenic with renal cortical thinning but no hydronephrosis.  Abdominal Aorta:  No aneurysm.  The sonographer questioned bowel wall thickening at the level of the colonic hepatic flexure measuring 9 mm, but this segment appears collapsed and this finding is of unknown clinical significance.  IMPRESSION: Small echogenic kidneys likely indicating chronic medical renal disease.  No acute finding.  Possible hepatic flexure colonic wall thickening but artifactual thickening related to decompression is possible.   Original Report Authenticated By: Christiana Pellant, M.D.   Ct Abdomen Pelvis W Contrast  10/01/2012   *RADIOLOGY REPORT*  Clinical Data: Right upper quadrant and right-sided abdominal pain.  CT ABDOMEN AND PELVIS WITH CONTRAST  Technique:  Multidetector CT imaging of the abdomen and pelvis was performed following the standard protocol during bolus administration of intravenous contrast.  Contrast: OMNIPAQUE IOHEXOL  300 MG/ML  SOLN  Comparison: No priors.  Findings:  Lung Bases: Mild scarring throughout the lung bases bilaterally.  Abdomen/Pelvis:  There is some thickening of the antral prepyloric region of the stomach as well as some thickening of the first and second portions of the duodenum.  Notably, there are inflammatory changes adjacent to the second portion of the duodenum, suggestive of duodenitis. Image of 33 of series 5 demonstrates a potential focal outpouching of  the duodenum in the region of the proximal second portion, concerning for potential ulcer.  The appearance of the liver, gallbladder, pancreas, spleen and right adrenal gland are unremarkable.  There is a 1.4 cm nodule in the lateral limb of the left adrenal gland which is indeterminate. Mild bilateral renal atrophy with multi focal cortical thinning.  Extensive atherosclerosis of the abdominal aorta and pelvic vasculature.  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  There is a bypass graft extending from the right common iliac artery to the right kidney.  Mildly fusiform ectasia of the infrarenal abdominal aorta is noted, measuring up to 2.8 x 2.6 cm.  Numerous colonic diverticula are noted, without surrounding inflammatory changes to suggest acute diverticulitis at this time.  No significant volume of ascites.  No pneumoperitoneum.  No pathologic distension of small bowel.  The appendix is not visualized and is likely surgically absent.  No inflammatory changes are noted adjacent to the cecum to suggest acute appendicitis at this time.  No definite pathologic lymphadenopathy identified within the abdomen or pelvis. Uterus and ovaries are atrophic.  Urinary bladder is normal in appearance.  Musculoskeletal: Post vertebroplasty changes are noted at T11 where there is approximately 20% loss of anterior vertebral body height. 4 mm of anterolisthesis of L5 upon S1.  Alignment is otherwise anatomic. There are no aggressive appearing lytic or blastic lesions noted in the visualized portions of the skeleton.  IMPRESSION: 1.  Findings, as above, concerning for distal gastritis and duodenitis. Image 33 of series 5 demonstrates a potential ulcer in the lateral wall of the proximal second portion of the duodenum. 2.  No evidence of gallstones or findings to suggest acute cholecystitis at this time. 3.  Colonic diverticulosis without findings to suggest acute diverticulitis at this time. 4.  Extensive atherosclerosis, status  post right renal artery bypass graft placement (extending from the proximal common iliac artery on the right), as above. 5.  Additional incidental findings, as above.   Original Report Authenticated By: Trudie Reed, M.D.    Medications: I have reviewed the patient's current medications.  Assessment/Plan: 1. Large postbulbar duodenal ulcer. More than likely this was due to NSAIDs. Will need to remain off of NSAIDs for at least one month. Try to control pain with Vicodin. Would go ahead and advance her diet to lower residue. If she is able stable, she could be discharged tomorrow follow-up with Dr. Madilyn Fireman in about 4 to 6 weeks to arrange repeat EGD. Will need to remain off of all NSAIDs and on BID PPI.   Sheriece Jefcoat JR,Oleg Oleson L 10/02/2012, 9:43 AM

## 2012-10-03 LAB — CBC
MCH: 28.2 pg (ref 26.0–34.0)
MCV: 90 fL (ref 78.0–100.0)
Platelets: 235 10*3/uL (ref 150–400)
RBC: 4.18 MIL/uL (ref 3.87–5.11)
RDW: 15.1 % (ref 11.5–15.5)

## 2012-10-03 MED ORDER — PANTOPRAZOLE SODIUM 40 MG PO TBEC: 40.0000 mg | DELAYED_RELEASE_TABLET | Freq: Two times a day (BID) | ORAL | Status: DC

## 2012-10-03 MED ORDER — TRAMADOL HCL 50 MG PO TABS: 50.0000 mg | ORAL_TABLET | Freq: Two times a day (BID) | ORAL | Status: DC

## 2012-10-03 MED ORDER — POLYETHYLENE GLYCOL 3350 17 G PO PACK: 17.0000 g | PACK | Freq: Every day | ORAL | Status: DC | PRN

## 2012-10-03 MED ORDER — HYDROCODONE-ACETAMINOPHEN 5-325 MG PO TABS: 1.0000 | ORAL_TABLET | ORAL | Status: DC | PRN

## 2012-10-03 NOTE — Progress Notes (Signed)
Pt resting on bed, denies any pain or discomfort, no distress noticed, no family member at the bedside. Pt assisted to [facility] as needed, bed alarm in place for safety. We'll continue with POC.

## 2012-10-03 NOTE — Discharge Summary (Signed)
DISCHARGE SUMMARY  [name]  MR#: [id number]  DOB:[birth year]  Date of Admission: 09/30/2012 Date of Discharge: 10/03/2012  Attending Physician:Elizibeth Breau ALAN  Patient's [name],[name], MD  Consults:Treatment Team:  Barrie Folk, MD    Discharge Diagnoses: DUODENAL ULCER;  . On BID PPI.  Stable hemoglobin HYPERTENSION: Stable COPD: Stable on supplemental O2  DM 2: Stable on diet alone DM NEUROPATHY: on rx  HYPERLIPIDEMIA: on Rx  SLEEP APNEA: stable  OSTEOPOROSIS WITH VERTEBROPLASTY; stable, without current back pain Severe osteoarthritis of both knees, flared up off of nonsteroidals Blood loss anemia Mild protein calorie malnutrition History of pituitary tumor removal x2, not currently on  Replacement Incidental adrenal nodule noted on CT Left renal artery aneurysm noted on CT PROLONGED QT interval noted on EKG   Discharge Medications:   Medication List    STOP taking these medications       etodolac 500 MG tablet  Commonly known as:  LODINE      TAKE these medications       ACCU-CHEK AVIVA PLUS test strip  Generic drug:  glucose blood     amLODipine 10 MG tablet  Commonly known as:  NORVASC  Take 15 mg by mouth daily.     aspirin 81 MG tablet  ON HOLD     atorvastatin 80 MG tablet  Commonly known as:  LIPITOR  Take 80 mg by mouth daily.     CALCIUM 1200 PO  Take 1 capsule by mouth daily.     clobetasol ointment 0.05 %  Commonly known as:  TEMOVATE  as needed.     fexofenadine 180 MG tablet  Commonly known as:  ALLEGRA  Take 180 mg by mouth daily.     gabapentin 600 MG tablet  Commonly known as:  NEURONTIN  Take 600 mg by mouth daily.     HYDROcodone-acetaminophen 5-325 MG per tablet  Commonly known as:  NORCO/VICODIN  Take 1-2 tablets by mouth every 4 (four) hours as needed.     losartan 50 MG tablet  Commonly known as:  COZAAR  Take 1 tablet by mouth daily.     pantoprazole 40 MG tablet  Commonly known as:   PROTONIX  Take 1 tablet (40 mg total) by mouth 2 (two) times daily.     polyethylene glycol packet  Commonly known as:  MIRALAX / GLYCOLAX  Take 17 g by mouth daily as needed.     potassium chloride 10 MEQ CR tablet  Commonly known as:  KLOR-CON  Take 10 mEq by mouth daily.     traMADol 50 MG tablet  Commonly known as:  ULTRAM  Take 1 tablet (50 mg total) by mouth every 12 (twelve) hours.        Hospital Procedures: Dg Chest 2 View  09/30/2012   *RADIOLOGY REPORT*  Clinical Data: Chest pain  CHEST - 2 VIEW  Comparison: 01/29/2011  Findings: The heart and pulmonary vascularity are within normal limits.  The lungs are clear bilaterally.  There are changes consistent with prior vertebral augmentation.  No acute abnormality is seen.  IMPRESSION: No acute abnormality noted.   Original Report Authenticated By: Alcide Clever, M.D.   US Abdomen Complete  09/30/2012   *RADIOLOGY REPORT*  Clinical Data:  Chest pain  ABDOMINAL ULTRASOUND COMPLETE  Comparison:  None.  Findings:  Gallbladder:  No gallstones, gallbladder wall thickening, or pericholecystic fluid.  Common Bile Duct:  Within normal limits in caliber.  Liver: No focal mass lesion identified.  Within normal limits in parenchymal echogenicity.  IVC:  Appears normal.  Pancreas:  No abnormality identified.  Spleen:  Within normal limits in size and echotexture.  Right kidney:  8.7 cm.  Echogenic with renal cortical thinning but no hydronephrosis.  Left kidney:  10.4 cm.  Echogenic with renal cortical thinning but no hydronephrosis.  Abdominal Aorta:  No aneurysm.  The sonographer questioned bowel wall thickening at the level of the colonic hepatic flexure measuring 9 mm, but this segment appears collapsed and this finding is of unknown clinical significance.  IMPRESSION: Small echogenic kidneys likely indicating chronic medical renal disease.  No acute finding.  Possible hepatic flexure colonic wall thickening but artifactual thickening related to  decompression is possible.   Original Report Authenticated By: Christiana Pellant, M.D.   Ct Abdomen Pelvis W Contrast  10/01/2012   *RADIOLOGY REPORT*  Clinical Data: Right upper quadrant and right-sided abdominal pain.  CT ABDOMEN AND PELVIS WITH CONTRAST  Technique:  Multidetector CT imaging of the abdomen and pelvis was performed following the standard protocol during bolus administration of intravenous contrast.  Contrast: OMNIPAQUE IOHEXOL 300 MG/ML  SOLN  Comparison: No priors.  Findings:  Lung Bases: Mild scarring throughout the lung bases bilaterally.  Abdomen/Pelvis:  There is some thickening of the antral prepyloric region of the stomach as well as some thickening of the first and second portions of the duodenum.  Notably, there are inflammatory changes adjacent to the second portion of the duodenum, suggestive of duodenitis. Image of 33 of series 5 demonstrates a potential focal outpouching of the duodenum in the region of the proximal second portion, concerning for potential ulcer.  The appearance of the liver, gallbladder, pancreas, spleen and right adrenal gland are unremarkable.  There is a 1.4 cm nodule in the lateral limb of the left adrenal gland which is indeterminate. Mild bilateral renal atrophy with multi focal cortical thinning.  Extensive atherosclerosis of the abdominal aorta and pelvic vasculature.  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  There is a bypass graft extending from the right common iliac artery to the right kidney.  Mildly fusiform ectasia of the infrarenal abdominal aorta is noted, measuring up to 2.8 x 2.6 cm.  Numerous colonic diverticula are noted, without surrounding inflammatory changes to suggest acute diverticulitis at this time.  No significant volume of ascites.  No pneumoperitoneum.  No pathologic distension of small bowel.  The appendix is not visualized and is likely surgically absent.  No inflammatory changes are noted adjacent to the cecum to suggest  acute appendicitis at this time.  No definite pathologic lymphadenopathy identified within the abdomen or pelvis. Uterus and ovaries are atrophic.  Urinary bladder is normal in appearance.  Musculoskeletal: Post vertebroplasty changes are noted at T11 where there is approximately 20% loss of anterior vertebral body height. 4 mm of anterolisthesis of L5 upon S1.  Alignment is otherwise anatomic. There are no aggressive appearing lytic or blastic lesions noted in the visualized portions of the skeleton.  IMPRESSION: 1.  Findings, as above, concerning for distal gastritis and duodenitis. Image 33 of series 5 demonstrates a potential ulcer in the lateral wall of the proximal second portion of the duodenum. 2.  No evidence of gallstones or findings to suggest acute cholecystitis at this time. 3.  Colonic diverticulosis without findings to suggest acute diverticulitis at this time. 4.  Extensive atherosclerosis, status post right renal artery bypass graft placement (extending from the proximal common iliac artery on the right),  as above. 5.  Additional incidental findings, as above.   Original Report Authenticated By: Trudie Reed, M.D.    History of Present Illness: Abdominal pain  Hospital Course: This is 78 year old white female admitted with abdominal pain. Ultrasound was negative. Further evaluation and treatment included an endoscopy which showed a deep duodenal ulcer. This is felt to be due to her chronic nonsteroidal use of a total lack. Patient has had some blood loss anemia but is not hemodynamically unstable. The ulcer will need a repeat endoscopy to make sure it is healed. She's now been taken off her nonsteroidals and the pain in her knees has flared substantially. She's now requiring alternative pain medications for this. Her oxygen-dependent COPD is done fair while here. Oxygen saturations are doing well she has no active wheezing. She's resumed a diet and is tolerating this. Her bowels are  working without active bleeding. She's had no cardiac symptoms while here. Her mobility is minimal. She will need a home hospital bed since she will not be able to ascend her stairs based both on her knees and her pulmonary status. Her blood sugars have done w while here. She is on diet alone. Her blood pressure is been relatively stable. Her electrolytes are doing well. An incidental finding of a long QT is noted on EKG and this needs to be compared to office records  Day of Discharge Exam BP 126/60  Pulse 66  Temp(Src) 98 F (36.7 C) (Oral)  Resp 18  Ht 5\' 1"  (1.549 m)  Wt 72.544 kg (159 lb 14.9 oz)  BMI 30.23 kg/m2  SpO2 96%  Physical Exam: General appearance: Chronically ill-appearing white female sitting up in no distress with oxygen in place extraocular movements are intact without nystagmus Eyes: no scleral icterus Throat: oropharynx moist without erythema Resp: Diminished but no wheezes rales or rhonchi no accessory muscles in use Cardio: Regular and distant with systolic mitral murmur GI: soft, non-tender; bowel sounds normal; no masses,  no organomegaly Extremities: Diminished pulses, no edema, extensive Osteoarthritis changes of both knees. Neuro: Patient's awake alert and mentating well. She has some mild anxiety.   Discharge Labs:  Recent Labs  10/01/12 0933 10/02/12 0700  NA 139 138  K 3.5 3.7  CL 102 107  CO2 26 23  GLUCOSE 92 121*  BUN 20 19  CREATININE 1.00 1.00  CALCIUM 9.0 8.5    Recent Labs  10/01/12 0933 10/02/12 0700  AST 18 17  ALT 10 9  ALKPHOS 79 67  BILITOT 0.6 0.5  PROT 6.3 5.5*  ALBUMIN 3.5 2.9*    Recent Labs  10/02/12 0700 10/03/12 0440  WBC 8.4 7.2  HGB 11.3* 11.8*  HCT 34.2* 37.6  MCV 87.9 90.0  PLT 268 235     Recent Labs  09/30/12 2024  TROPONINI <0.30      Discharge instructions: To resume aspirin in 2 weeks   Disposition: To home  Follow-up Appts: Follow-up with Dr. Wylene Simmer at [facility] in a week.  Call for appointment.  Condition on Discharge: Improved  Tests Needing Follow-up: None  Signed: Hensley Treat ALAN 10/03/2012, 10:34 AM

## 2012-10-03 NOTE — Progress Notes (Signed)
   CARE MANAGEMENT NOTE 10/03/2012  Patient:  FATOUMATA, ALBAUGH   Account Number:  [id number]  Date Initiated:  10/03/2012  Documentation initiated by:  [facility]  Subjective/Objective Assessment:   adm w/abdominal pain; duodenal ulcer     Action/Plan:   Anticipated DC Date:  10/04/2012   Anticipated DC Plan:  HOME W HOME HEALTH SERVICES      DC Planning Services  CM consult      [facility] Choice  HOME HEALTH   Choice offered to / List presented to:  C-1 Patient   DME arranged  HOSPITAL BED      DME agency  [facility]     HH arranged  HH-2 PT      [facility] agency  [facility]   Status of service:  Completed, signed off Medicare Important Message given?   (If response is "NO", the following Medicare IM given date fields will be blank) Date Medicare IM given:   Date Additional Medicare IM given:    Discharge Disposition:  HOME W HOME HEALTH SERVICES  Per UR Regulation:    If discussed at Long Length of Stay Meetings, dates discussed:    Comments:  10-03-12 17:00 CM spoke to pt and pt's daughter, who will be available until 12:00 at [phone] tomorrow 10-04-12. Bed request faxed to [facility] for home delivery tomorrow. Choice was given to pt and she chose [facility] for HHPT. Referral was made to [facility] for HHPT.  No further CM needs were communicated.  Freddy Jaksch, BSN, CM [phone].

## 2012-10-03 NOTE — Progress Notes (Signed)
Awaiting discharge.  Case management notified of need for hospital bed at home and PT.

## 2012-10-04 ENCOUNTER — Encounter (HOSPITAL_COMMUNITY): Payer: Self-pay | Admitting: Gastroenterology

## 2012-10-04 MED ORDER — ONDANSETRON HCL 4 MG/2ML IJ SOLN
4.0000 mg | Freq: Four times a day (QID) | INTRAMUSCULAR | Status: DC | PRN
Start: 2012-10-04 — End: 2012-10-05
  Administered 2012-10-04: 4 mg via INTRAVENOUS
  Filled 2012-10-04 (×2): qty 2

## 2012-10-04 NOTE — Discharge Summary (Addendum)
DISCHARGE SUMMARY  [name]  MR#: [id number]  DOB:Jul 20, 1934  Date of Admission: 09/30/2012 Date of Discharge: 10/04/2012  Attending Physician:Tameem Pullara W  Patient's [name],[name], MD  Consults:Treatment Team:  Barrie Folk, MD  [facility]  Discharge Diagnoses: DUODENAL ULCER; . On BID PPI. Stable hemoglobin  HYPERTENSION: Stable  COPD: Stable on supplemental O2  DM 2: Stable on diet alone  DM NEUROPATHY: on rx  HYPERLIPIDEMIA: on Rx  SLEEP APNEA: stable  OSTEOPOROSIS WITH VERTEBROPLASTY; stable, without current back pain  Severe osteoarthritis of both knees, flared up off of nonsteroidals  Blood loss anemia  Mild protein calorie malnutrition  History of pituitary tumor removal x2, not currently on Replacement  Incidental adrenal nodule noted on CT  Left renal artery aneurysm noted on CT  PROLONGED QT interval noted on EKG   Discharge Medications:   Medication List    STOP taking these medications       etodolac 500 MG tablet  Commonly known as:  LODINE      TAKE these medications       ACCU-CHEK AVIVA PLUS test strip  Generic drug:  glucose blood     amLODipine 10 MG tablet  Commonly known as:  NORVASC  Take 15 mg by mouth daily.     aspirin 81 MG tablet  ON HOLD     atorvastatin 80 MG tablet  Commonly known as:  LIPITOR  Take 80 mg by mouth daily.     CALCIUM 1200 PO  Take 1 capsule by mouth daily.     clobetasol ointment 0.05 %  Commonly known as:  TEMOVATE  as needed.     fexofenadine 180 MG tablet  Commonly known as:  ALLEGRA  Take 180 mg by mouth daily.     gabapentin 600 MG tablet  Commonly known as:  NEURONTIN  Take 600 mg by mouth daily.     losartan 50 MG tablet  Commonly known as:  COZAAR  Take 1 tablet by mouth daily.     pantoprazole 40 MG tablet  Commonly known as:  PROTONIX  Take 1 tablet (40 mg total) by mouth 2 (two) times daily.     polyethylene glycol packet  Commonly known as:  MIRALAX /  GLYCOLAX  Take 17 g by mouth daily as needed.     potassium chloride 10 MEQ CR tablet  Commonly known as:  KLOR-CON  Take 10 mEq by mouth daily.     traMADol 50 MG tablet  Commonly known as:  ULTRAM  Take 1 tablet (50 mg total) by mouth every 12 (twelve) hours.        Hospital Procedures: Dg Chest 2 View  09/30/2012   *RADIOLOGY REPORT*  Clinical Data: Chest pain  CHEST - 2 VIEW  Comparison: 01/29/2011  Findings: The heart and pulmonary vascularity are within normal limits.  The lungs are clear bilaterally.  There are changes consistent with prior vertebral augmentation.  No acute abnormality is seen.  IMPRESSION: No acute abnormality noted.   Original Report Authenticated By: Alcide Clever, M.D.   US Abdomen Complete  09/30/2012   *RADIOLOGY REPORT*  Clinical Data:  Chest pain  ABDOMINAL ULTRASOUND COMPLETE  Comparison:  None.  Findings:  Gallbladder:  No gallstones, gallbladder wall thickening, or pericholecystic fluid.  Common Bile Duct:  Within normal limits in caliber.  Liver: No focal mass lesion identified.  Within normal limits in parenchymal echogenicity.  IVC:  Appears normal.  Pancreas:  No abnormality identified.  Spleen:  Within normal limits in size and echotexture.  Right kidney:  8.7 cm.  Echogenic with renal cortical thinning but no hydronephrosis.  Left kidney:  10.4 cm.  Echogenic with renal cortical thinning but no hydronephrosis.  Abdominal Aorta:  No aneurysm.  The sonographer questioned bowel wall thickening at the level of the colonic hepatic flexure measuring 9 mm, but this segment appears collapsed and this finding is of unknown clinical significance.  IMPRESSION: Small echogenic kidneys likely indicating chronic medical renal disease.  No acute finding.  Possible hepatic flexure colonic wall thickening but artifactual thickening related to decompression is possible.   Original Report Authenticated By: Christiana Pellant, M.D.   Ct Abdomen Pelvis W Contrast  10/01/2012    *RADIOLOGY REPORT*  Clinical Data: Right upper quadrant and right-sided abdominal pain.  CT ABDOMEN AND PELVIS WITH CONTRAST  Technique:  Multidetector CT imaging of the abdomen and pelvis was performed following the standard protocol during bolus administration of intravenous contrast.  Contrast: OMNIPAQUE IOHEXOL 300 MG/ML  SOLN  Comparison: No priors.  Findings:  Lung Bases: Mild scarring throughout the lung bases bilaterally.  Abdomen/Pelvis:  There is some thickening of the antral prepyloric region of the stomach as well as some thickening of the first and second portions of the duodenum.  Notably, there are inflammatory changes adjacent to the second portion of the duodenum, suggestive of duodenitis. Image of 33 of series 5 demonstrates a potential focal outpouching of the duodenum in the region of the proximal second portion, concerning for potential ulcer.  The appearance of the liver, gallbladder, pancreas, spleen and right adrenal gland are unremarkable.  There is a 1.4 cm nodule in the lateral limb of the left adrenal gland which is indeterminate. Mild bilateral renal atrophy with multi focal cortical thinning.  Extensive atherosclerosis of the abdominal aorta and pelvic vasculature.  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  There is a bypass graft extending from the right common iliac artery to the right kidney.  Mildly fusiform ectasia of the infrarenal abdominal aorta is noted, measuring up to 2.8 x 2.6 cm.  Numerous colonic diverticula are noted, without surrounding inflammatory changes to suggest acute diverticulitis at this time.  No significant volume of ascites.  No pneumoperitoneum.  No pathologic distension of small bowel.  The appendix is not visualized and is likely surgically absent.  No inflammatory changes are noted adjacent to the cecum to suggest acute appendicitis at this time.  No definite pathologic lymphadenopathy identified within the abdomen or pelvis. Uterus and ovaries are  atrophic.  Urinary bladder is normal in appearance.  Musculoskeletal: Post vertebroplasty changes are noted at T11 where there is approximately 20% loss of anterior vertebral body height. 4 mm of anterolisthesis of L5 upon S1.  Alignment is otherwise anatomic. There are no aggressive appearing lytic or blastic lesions noted in the visualized portions of the skeleton.  IMPRESSION: 1.  Findings, as above, concerning for distal gastritis and duodenitis. Image 33 of series 5 demonstrates a potential ulcer in the lateral wall of the proximal second portion of the duodenum. 2.  No evidence of gallstones or findings to suggest acute cholecystitis at this time. 3.  Colonic diverticulosis without findings to suggest acute diverticulitis at this time. 4.  Extensive atherosclerosis, status post right renal artery bypass graft placement (extending from the proximal common iliac artery on the right), as above. 5.  Additional incidental findings, as above.   Original Report Authenticated By: Trudie Reed, M.D.  History of Present Illness: Patient is a 78 year old female with history of mild gastritis who takes NSAIDs, was given samples of PPI at last visit and discontinued without refilling.  Hospital Course: This is 78 year old white female admitted with abdominal pain. Ultrasound was negative. CT showed gastritis and duodenitis.  Further evaluation and treatment included an endoscopy which showed a deep duodenal ulcer. This is felt to be due to her chronic nonsteroidal use of etodolac. Patient has had some blood loss anemia but is not hemodynamically unstable. The ulcer will need a repeat endoscopy to make sure it is healed. She's now been taken off her nonsteroidals and the pain in her knees has flared substantially. She's now requiring alternative pain medications for this. Her oxygen-dependent COPD is done fair while here. Oxygen saturations are doing well she has no active wheezing. She's resumed a diet and is  tolerating this. Her bowels are working without active bleeding. She's had no cardiac symptoms while here. Her mobility is minimal. She will need a home hospital bed since she will not be able to ascend her stairs based both on her knees and her pulmonary status. Her blood sugars have done w while here. She is on diet alone. Her blood pressure is been relatively stable. Her electrolytes are doing well.  Written instructions as to diet were given to the patient at the time of discharge and there was a delay in obtaining her hospital bed through [facility] on Sunday after they initially stated they could get one out to her.  She is still picking at her food and feels better but is not back to baseline, as is understandable.  Patient was detailed on the fact that it will take time for her to heal and the EGD was not a therapeutic intervention for her GI distress.   Day of Discharge Exam BP 125/55  Pulse 80  Temp(Src) 98 F (36.7 C) (Oral)  Resp 18  Ht 5\' 1"  (1.549 m)  Wt 71.668 kg (158 lb)  BMI 29.87 kg/m2  SpO2 90%  Physical Exam: General appearance: Chronically ill-appearing white female sitting up in no distress with oxygen in place extraocular movements are intact without nystagmus  Eyes: no scleral icterus  Throat: oropharynx moist without erythema  Resp: Diminished but no wheezes rales or rhonchi no accessory muscles in use  Cardio: Regular and distant with systolic mitral murmur  GI: soft, non-tender; bowel sounds normal; no masses, no organomegaly  Extremities: Diminished pulses, no edema, extensive Osteoarthritis changes of both knees.  Neuro: Patient's awake alert and mentating well. She has some mild anxiety.  Discharge Labs:  Recent Labs  10/01/12 0933 10/02/12 0700  NA 139 138  K 3.5 3.7  CL 102 107  CO2 26 23  GLUCOSE 92 121*  BUN 20 19  CREATININE 1.00 1.00  CALCIUM 9.0 8.5    Recent Labs  10/01/12 0933 10/02/12 0700  AST 18 17  ALT 10 9  ALKPHOS 79 67  BILITOT  0.6 0.5  PROT 6.3 5.5*  ALBUMIN 3.5 2.9*    Recent Labs  10/02/12 0700 10/03/12 0440  WBC 8.4 7.2  HGB 11.3* 11.8*  HCT 34.2* 37.6  MCV 87.9 90.0  PLT 268 235   Lab Results  Component Value Date   INR 0.9 06/24/2011    Discharge instructions:  Parke Simmers diet was detailed.  Stop NSAID use, including OTC's as well as ASA, take meds as prescribed.  Hospital bed and outpatient HHPT have been arranged.  SHe is to consider whether attending a funeral on Friday in [place] is actually feasable and use her best judgment.  Disposition: Home with DME and HHPT as above.  Follow-up Appts: Follow-up with Dr. Wylene Simmer at [facility] in 1 week  Call for appointment.  Condition on Discharge: Stable  Tests Needing Follow-up: Repeat EGD with [facility] will be arranged by their office.  Total time required for discharge: 45 minutes. Signed: Gaspar Garbe 10/04/2012, 8:01 AM

## 2012-10-04 NOTE — Evaluation (Signed)
Occupational Therapy Evaluation Patient Details Name: [name] MRN: [id number] DOB: 1934-07-16 Today's Date: 10/04/2012 Time: 4098-1191 OT Time Calculation (min): 34 min  OT Assessment / Plan / Recommendation History of present illness Reports worsening abdominal pain over the past 24-36 hours. She reports she feels better if she lays on her left side. The pain is mainly in her upper abdomen and the right side of her abdomen. Nausea and is vomiting in the emergency department. The pain radiates up into her chest. She reports decreased oral intake today. She's never had pain like this before. Underwent ultrasound and abd CT which showed gastritis and duodenitis.  Due to the level of her pain, was given IV pain meds and I was asked to admit her this AM. EGD showed large postbulbar duodenal ulcer   Clinical Impression   Pt admitted with above. Pt currently with functional limitations due to the deficits listed below (see OT Problem List). Pt greatly limited by orthostatic hypotension and weakness. Pt will benefit from skilled OT to increase their safety and independence with ADL and functional mobility for ADL to facilitate discharge to venue listed below.       OT Assessment  Patient needs continued OT Services    Follow Up Recommendations  SNF;Supervision/Assistance - 24 hour    Barriers to Discharge Decreased caregiver support    Equipment Recommendations  None recommended by OT       Frequency  Min 2X/week    Precautions / Restrictions Precautions Precautions: Fall Precaution Comments: orthostatic hypotension Restrictions Weight Bearing Restrictions: No   Pertinent Vitals/Pain Laying 127/59; sitting 124/58; standing 105/56    ADL  Eating/Feeding: Independent (but not able to tolerate much) Where Assessed - Eating/Feeding: Bed level Grooming: Moderate assistance (due to dizziness) Where Assessed - Grooming: Unsupported sitting Upper Body Bathing: Moderate  assistance (due to dizziness) Where Assessed - Upper Body Bathing: Unsupported sitting Lower Body Bathing: Moderate assistance (due to dizziness) Where Assessed - Lower Body Bathing: Unsupported sit to stand Upper Body Dressing: Moderate assistance (due to dizziness) Where Assessed - Upper Body Dressing: Unsupported sitting Lower Body Dressing: Moderate assistance (due to dizziness) Where Assessed - Lower Body Dressing: Unsupported sit to stand Toilet Transfer: Moderate assistance (due to dizziness) Toilet Transfer Method: Sit to stand Toilet Transfer Equipment: Bedside commode Toileting - Clothing Manipulation and Hygiene: Moderate assistance (due to dizziness) Where Assessed - Toileting Clothing Manipulation and Hygiene: Sit to stand from 3-in-1 or toilet Transfers/Ambulation Related to ADLs: Min A sit to stand and stand to sit, unable to ambulate due to dizziness--pt orthostatic with positional changes    OT Diagnosis:    OT Problem List: Decreased strength;Decreased activity tolerance;Decreased knowledge of use of DME or AE OT Treatment Interventions: Self-care/ADL training;Balance training;Therapeutic activities;DME and/or AE instruction;Patient/family education   OT Goals(Current goals can be found in the care plan section) Acute Rehab OT Goals Patient Stated Goal: There is just no way I can take care of myself until this dizziness is resolved and I can keep food down OT Goal Formulation: With patient Time For Goal Achievement: 10/11/12 Potential to Achieve Goals: Good  Visit Information  Last OT Received On: 10/04/12 Assistance Needed: +1 History of Present Illness: Reports worsening abdominal pain over the past 24-36 hours. She reports she feels better if she lays on her left side. The pain is mainly in her upper abdomen and the right side of her abdomen. Nausea and is vomiting in the emergency department. The pain radiates up into  her chest. She reports decreased oral intake  today. She's never had pain like this before. Underwent ultrasound and abd CT which showed gastritis and duodenitis.  Due to the level of her pain, was given IV pain meds and I was asked to admit her this AM. EGD showed large postbulbar duodenal ulcer       Prior Functioning     Home Living Family/patient expects to be discharged to:: Private residence Living Arrangements: Alone Available Help at Discharge:  (dtr lives in [place] and cannot A her) Type of Home: House (townhome) Home Access: Stairs to enter Secretary/administrator of Steps: 1 then 2 Entrance Stairs-Rails: None Home Layout: Two level Alternate Level Stairs-Number of Steps: 12 Alternate Level Stairs-Rails: Right Home Equipment: None Prior Function Level of Independence: Independent Communication Communication: No difficulties Dominant Hand: Right         Vision/Perception Vision - History Baseline Vision: Wears glasses only for reading Patient Visual Report: No change from baseline   Cognition  Cognition Arousal/Alertness: Awake/alert Behavior During Therapy: WFL for tasks assessed/performed Overall Cognitive Status: Within Functional Limits for tasks assessed    Extremity/Trunk Assessment Upper Extremity Assessment Upper Extremity Assessment: Overall WFL for tasks assessed     Mobility Bed Mobility Bed Mobility: Supine to Sit;Sitting - Scoot to Edge of Bed Supine to Sit: 5: Supervision;With rails;HOB elevated Sitting - Scoot to Edge of Bed: 5: Supervision Transfers Transfers: Sit to Stand;Stand to Sit Sit to Stand: 4: Min assist;With upper extremity assist;From bed Stand to Sit: 4: Min assist;With upper extremity assist;With armrests;To chair/3-in-1           End of Session OT - End of Session Activity Tolerance:  (limited by dizziness) Patient left: in chair;with call bell/phone within reach Nurse Communication:  (pt orthostatic and needs SNF)    Evette Georges [phone] 10/04/2012,  3:56 PM

## 2012-10-04 NOTE — Progress Notes (Signed)
10/04/2012 1000 Received call from [employer] and they will call pt or dtr for set up of hospital bed. They do not set up same day delivery or deliver on Sunday.   Isidoro Donning RN CCM Case Mgmt phone [phone]

## 2012-10-04 NOTE — Progress Notes (Signed)
Pt is alert and oriented x4. Pt refused all of her morning medications. Pt stating "I can't keep anything down, I don't want any of them." Pt is experiencing nausea and asked if she could get something to help. MD paged. Order for IV Zofran received. Pt informed the RN that her hospital bed cannot be delivered today, the company delivering the bed called the patient's daughter to inform them. Case management and Social Work have been informed that her bed cannot be delivered today. Pt is to work with PT since she will not be discharged today.

## 2012-10-04 NOTE — Progress Notes (Signed)
Pt c/o of indigestion after eating some graham cracker and Ginger soda. Maalox given as ordered PRN. We'll continue with POC.

## 2012-10-05 MED ORDER — ONDANSETRON HCL 4 MG PO TABS
4.0000 mg | ORAL_TABLET | Freq: Three times a day (TID) | ORAL | Status: DC | PRN
  Administered 2012-10-05: 4 mg via ORAL
  Filled 2012-10-05: qty 1

## 2012-10-05 NOTE — Progress Notes (Signed)
Pt discharged to [facility].  Called report to [place].  Ardyth Gal, RN 10/05/2012

## 2012-10-05 NOTE — Evaluation (Signed)
Physical Therapy Evaluation Patient Details Name: [name] MRN: [id number] DOB: January 24, 1935 Today's Date: 10/05/2012 Time: 4098-1191 PT Time Calculation (min): 24 min  PT Assessment / Plan / Recommendation History of Present Illness  Reports worsening abdominal pain over the past 24-36 hours. She reports she feels better if she lays on her left side. The pain is mainly in her upper abdomen and the right side of her abdomen. Nausea and is vomiting in the emergency department. The pain radiates up into her chest. She reports decreased oral intake today. She's never had pain like this before. Underwent ultrasound and abd CT which showed gastritis and duodenitis.  Due to the level of her pain, was given IV pain meds and I was asked to admit her this AM. EGD showed large postbulbar duodenal ulcer  Clinical Impression  Pt admitted with above. Pt currently with functional limitations due to the deficits listed below (see PT Problem List). Limited evaluation due to pt's dizziness and decrease in BP upon sitting upright.  Pt will benefit from skilled PT to increase their independence and safety with mobility to allow discharge to the venue listed below.     PT Assessment  Patient needs continued PT services    Follow Up Recommendations  SNF;Supervision/Assistance - 24 hour    Equipment Recommendations  None recommended by PT    Frequency Min 3X/week    Precautions / Restrictions Precautions Precautions: Fall (orthostatic) Precaution Comments: orthostatic hypotension Restrictions Weight Bearing Restrictions: No   Pertinent Vitals/Pain C/o discomfort in abdomen but does not rate; c/o dizziness      Mobility  Bed Mobility Bed Mobility: Supine to Sit;Sit to Supine Supine to Sit: With rails;HOB elevated;4: Min assist Sitting - Scoot to Edge of Bed: 5: Supervision Sit to Supine: 5: Supervision;HOB elevated;With rail Details for Bed Mobility Assistance: (A) to elevate trunk OOB  with heavy use of rail Transfers Transfers: Sit to Stand;Stand to Sit Sit to Stand: 4: Min assist;With upper extremity assist;From bed Stand to Sit: 4: Min assist;With upper extremity assist;With armrests;To chair/3-in-1 Details for Transfer Assistance: (A) to initiate transfer with cues for hand placement Ambulation/Gait Ambulation/Gait Assistance: Not tested (comment) (limited due to dizziness)    Exercises     PT Diagnosis: Difficulty walking;Generalized weakness;Acute pain  PT Problem List: Decreased strength;Decreased activity tolerance;Decreased balance;Decreased mobility;Decreased knowledge of use of DME;Pain PT Treatment Interventions: DME instruction;Gait training;Stair training;Functional mobility training;Therapeutic activities;Therapeutic exercise;Balance training;Patient/family education     PT Goals(Current goals can be found in the care plan section) Acute Rehab PT Goals Patient Stated Goal: There is just no way I can take care of myself until this dizziness is resolved and I can keep food down PT Goal Formulation: With patient Time For Goal Achievement: 09/22/12 Potential to Achieve Goals: Good  Visit Information  Last PT Received On: 10/05/12 Assistance Needed: +1 History of Present Illness: Reports worsening abdominal pain over the past 24-36 hours. She reports she feels better if she lays on her left side. The pain is mainly in her upper abdomen and the right side of her abdomen. Nausea and is vomiting in the emergency department. The pain radiates up into her chest. She reports decreased oral intake today. She's never had pain like this before. Underwent ultrasound and abd CT which showed gastritis and duodenitis.  Due to the level of her pain, was given IV pain meds and I was asked to admit her this AM. EGD showed large postbulbar duodenal ulcer  Prior Functioning  Home Living Family/patient expects to be discharged to:: Private residence Living  Arrangements: Alone Available Help at Discharge: Other (Comment) ((dtr lives in [employer] and cannot A her)) Type of Home: House (townhome) Home Access: Stairs to enter Secretary/administrator of Steps: 1 then 2 Entrance Stairs-Rails: None Home Layout: Two level Alternate Level Stairs-Number of Steps: 12 Alternate Level Stairs-Rails: Right Home Equipment: None Prior Function Level of Independence: Independent Communication Communication: No difficulties Dominant Hand: Right    Cognition  Cognition Arousal/Alertness: Awake/alert Behavior During Therapy: WFL for tasks assessed/performed Overall Cognitive Status: Within Functional Limits for tasks assessed    Extremity/Trunk Assessment Lower Extremity Assessment Lower Extremity Assessment: Generalized weakness   Balance Balance Balance Assessed: Yes Static Sitting Balance Static Sitting - Balance Support: Feet supported Static Sitting - Level of Assistance: 7: Independent Static Sitting - Comment/# of Minutes: ~ 5 minutes  End of Session PT - End of Session Equipment Utilized During Treatment: Gait belt Activity Tolerance: Other (comment) (Limited by fatigue) Patient left: in bed;with call bell/phone within reach Nurse Communication: Mobility status  GP     Garren Greenman 10/05/2012, 9:06 AM  Jake Shark, PT DPT [phone]

## 2012-10-05 NOTE — Discharge Summary (Signed)
DISCHARGE SUMMARY  [name]  MR#: [id number]  DOB:Jun 03, 1934  Date of Admission: 09/30/2012  Date of Discharge: 10/05/2012  Attending Physician:Vianney Kopecky W  Patient's [name],[name], MD  Consults:Treatment Team:  Barrie Folk, MD [facility]  Discharge Diagnoses:  DUODENAL ULCER; . On BID PPI. Stable hemoglobin  HYPERTENSION: Stable  COPD: Stable on supplemental O2  DM 2: Stable on diet alone  DM NEUROPATHY: on rx  HYPERLIPIDEMIA: on Rx  SLEEP APNEA: stable  OSTEOPOROSIS WITH VERTEBROPLASTY; stable, without current back pain  Severe osteoarthritis of both knees, flared up off of nonsteroidals  Blood loss anemia  Mild protein calorie malnutrition  History of pituitary tumor removal x2, not currently on Replacement  Incidental adrenal nodule noted on CT  Left renal artery aneurysm noted on CT  PROLONGED QT interval noted on EKG   Discharge Medications:    Medication List     STOP taking these medications       etodolac 500 MG tablet    Commonly known as: LODINE     TAKE these medications       ACCU-CHEK AVIVA PLUS test strip    Generic drug: glucose blood    amLODipine 10 MG tablet    Commonly known as: NORVASC    Take 15 mg by mouth daily.    aspirin 81 MG tablet    ON HOLD    atorvastatin 80 MG tablet    Commonly known as: LIPITOR    Take 80 mg by mouth daily.    CALCIUM 1200 PO    Take 1 capsule by mouth daily.    clobetasol ointment 0.05 %    Commonly known as: TEMOVATE    as needed.    fexofenadine 180 MG tablet    Commonly known as: ALLEGRA    Take 180 mg by mouth daily.    gabapentin 600 MG tablet    Commonly known as: NEURONTIN    Take 600 mg by mouth daily.    losartan 50 MG tablet    Commonly known as: COZAAR    Take 1 tablet by mouth daily.    pantoprazole 40 MG tablet    Commonly known as: PROTONIX    Take 1 tablet (40 mg total) by mouth 2 (two) times daily.    polyethylene glycol packet    Commonly known as: MIRALAX /  GLYCOLAX    Take 17 g by mouth daily as needed.    potassium chloride 10 MEQ CR tablet    Commonly known as: KLOR-CON    Take 10 mEq by mouth daily.    traMADol 50 MG tablet    Commonly known as: ULTRAM    Take 1 tablet (50 mg total) by mouth every 12 (twelve) hours.      Hospital Procedures:  Dg Chest 2 View  09/30/2012 *RADIOLOGY REPORT* Clinical Data: Chest pain CHEST - 2 VIEW Comparison: 01/29/2011 Findings: The heart and pulmonary vascularity are within normal limits. The lungs are clear bilaterally. There are changes consistent with prior vertebral augmentation. No acute abnormality is seen. IMPRESSION: No acute abnormality noted. Original Report Authenticated By: Alcide Clever, M.D.  US Abdomen Complete  09/30/2012 *RADIOLOGY REPORT* Clinical Data: Chest pain ABDOMINAL ULTRASOUND COMPLETE Comparison: None. Findings: Gallbladder: No gallstones, gallbladder wall thickening, or pericholecystic fluid. Common Bile Duct: Within normal limits in caliber. Liver: No focal mass lesion identified. Within normal limits in parenchymal echogenicity. IVC: Appears normal. Pancreas: No abnormality identified. Spleen: Within normal limits in size and  echotexture. Right kidney: 8.7 cm. Echogenic with renal cortical thinning but no hydronephrosis. Left kidney: 10.4 cm. Echogenic with renal cortical thinning but no hydronephrosis. Abdominal Aorta: No aneurysm. The sonographer questioned bowel wall thickening at the level of the colonic hepatic flexure measuring 9 mm, but this segment appears collapsed and this finding is of unknown clinical significance. IMPRESSION: Small echogenic kidneys likely indicating chronic medical renal disease. No acute finding. Possible hepatic flexure colonic wall thickening but artifactual thickening related to decompression is possible. Original Report Authenticated By: Christiana Pellant, M.D.  Ct Abdomen Pelvis W Contrast  10/01/2012 *RADIOLOGY REPORT* Clinical Data: Right upper quadrant  and right-sided abdominal pain. CT ABDOMEN AND PELVIS WITH CONTRAST Technique: Multidetector CT imaging of the abdomen and pelvis was performed following the standard protocol during bolus administration of intravenous contrast. Contrast: OMNIPAQUE IOHEXOL 300 MG/ML SOLN Comparison: No priors. Findings: Lung Bases: Mild scarring throughout the lung bases bilaterally. Abdomen/Pelvis: There is some thickening of the antral prepyloric region of the stomach as well as some thickening of the first and second portions of the duodenum. Notably, there are inflammatory changes adjacent to the second portion of the duodenum, suggestive of duodenitis. Image of 33 of series 5 demonstrates a potential focal outpouching of the duodenum in the region of the proximal second portion, concerning for potential ulcer. The appearance of the liver, gallbladder, pancreas, spleen and right adrenal gland are unremarkable. There is a 1.4 cm nodule in the lateral limb of the left adrenal gland which is indeterminate. Mild bilateral renal atrophy with multi focal cortical thinning. Extensive atherosclerosis of the abdominal aorta and pelvic vasculature. 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 There is a bypass graft extending from the right common iliac artery to the right kidney. Mildly fusiform ectasia of the infrarenal abdominal aorta is noted, measuring up to 2.8 x 2.6 cm. Numerous colonic diverticula are noted, without surrounding inflammatory changes to suggest acute diverticulitis at this time. No significant volume of ascites. No pneumoperitoneum. No pathologic distension of small bowel. The appendix is not visualized and is likely surgically absent. No inflammatory changes are noted adjacent to the cecum to suggest acute appendicitis at this time. No definite pathologic lymphadenopathy identified within the abdomen or pelvis. Uterus and ovaries are atrophic. Urinary bladder is normal in appearance. Musculoskeletal: Post  vertebroplasty changes are noted at T11 where there is approximately 20% loss of anterior vertebral body height. 4 mm of anterolisthesis of L5 upon S1. Alignment is otherwise anatomic. There are no aggressive appearing lytic or blastic lesions noted in the visualized portions of the skeleton. IMPRESSION: 1. Findings, as above, concerning for distal gastritis and duodenitis. Image 33 of series 5 demonstrates a potential ulcer in the lateral wall of the proximal second portion of the duodenum. 2. No evidence of gallstones or findings to suggest acute cholecystitis at this time. 3. Colonic diverticulosis without findings to suggest acute diverticulitis at this time. 4. Extensive atherosclerosis, status post right renal artery bypass graft placement (extending from the proximal common iliac artery on the right), as above. 5. Additional incidental findings, as above. Original Report Authenticated By: Trudie Reed, M.D.  History of Present Illness:  Patient is a 78 year old female with history of mild gastritis who takes NSAIDs, was given samples of PPI at last visit and discontinued without refilling.  Hospital Course:  This is 77 year old white female admitted with abdominal pain. Ultrasound was negative. CT showed gastritis and duodenitis. Further evaluation and treatment included an endoscopy  which showed a deep duodenal ulcer. This is felt to be due to her chronic nonsteroidal use of etodolac. Patient has had some blood loss anemia but is not hemodynamically unstable. The ulcer will need a repeat endoscopy to make sure it is healed. She's now been taken off her nonsteroidals and the pain in her knees has flared substantially. She's now requiring alternative pain medications for this. Her oxygen-dependent COPD is done fair while here. Oxygen saturations are doing well she has no active wheezing. She's resumed a diet and is tolerating this. Her bowels are working without active bleeding. She's had no cardiac  symptoms while here. Her mobility is minimal. She will need a home hospital bed since she will not be able to ascend her stairs based both on her knees and her pulmonary status. Her blood sugars have done w while here. She is on diet alone. Her blood pressure is been relatively stable. Her electrolytes are doing well. Written instructions as to diet were given to the patient at the time of discharge and there was a delay in obtaining her hospital bed through [facility] on Sunday after they initially stated they could get one out to her. She is still picking at her food and feels better but is not back to baseline, as is understandable. Patient was detailed on the fact that it will take time for her to heal and the EGD was not a therapeutic intervention for her GI distress.   Delay in discharge related to delivery of hospital bed through the preferred provider of her insurance company who have indicated that they do not have same day service, therefore delaying discharge.  Social work notes possible SNF placement but no decision formally made at time of this dictation and patient indicates that she prefers to return home. Day of Discharge Exam  BP 125/55  Pulse 80  Temp(Src) 98 F (36.7 C) (Oral)  Resp 18  Ht 5\' 1"  (1.549 m)  Wt 71.668 kg (158 lb)  BMI 29.87 kg/m2  SpO2 90%  Physical Exam:  General appearance: Chronically ill-appearing white female sitting up in no distress with oxygen in place extraocular movements are intact without nystagmus  Eyes: no scleral icterus  Throat: oropharynx moist without erythema  Resp: Diminished but no wheezes rales or rhonchi no accessory muscles in use  Cardio: Regular and distant with systolic mitral murmur  GI: soft, non-tender; bowel sounds normal; no masses, no organomegaly  Extremities: Diminished pulses, no edema, extensive Osteoarthritis changes of both knees.  Neuro: Patient's awake alert and mentating well. She has some mild anxiety.  Discharge Labs:    Recent Labs   10/01/12 0933  10/02/12 0700   NA  139  138   K  3.5  3.7   CL  102  107   CO2  26  23   GLUCOSE  92  121*   BUN  20  19   CREATININE  1.00  1.00   CALCIUM  9.0  8.5     Recent Labs   10/01/12 0933  10/02/12 0700   AST  18  17   ALT  10  9   ALKPHOS  79  67   BILITOT  0.6  0.5   PROT  6.3  5.5*   ALBUMIN  3.5  2.9*     Recent Labs   10/02/12 0700  10/03/12 0440   WBC  8.4  7.2   HGB  11.3*  11.8*   HCT  34.2*  37.6   MCV  87.9  90.0   PLT  268  235    Lab Results   Component  Value  Date    INR  0.9  06/24/2011    Discharge instructions: Parke Simmers diet was detailed. Stop NSAID use, including OTC's as well as ASA, take meds as prescribed. Hospital bed and outpatient HHPT have been arranged. SHe is to consider whether attending a funeral on Friday in [facility] is actually feasable and use her best judgment.  Disposition: Home with DME and HHPT as above.  Follow-up Appts:  Follow-up with Dr. Wylene Simmer at [facility] in 1 week Call for appointment.  Condition on Discharge: Stable  Tests Needing Follow-up:  Repeat EGD with [facility] will be arranged by their office.  Total time required for discharge: 45 minutes.  Signed:  Gaspar Garbe  10/05/2012, 7:31 AM

## 2012-10-05 NOTE — Progress Notes (Signed)
Pt. Alert and oriented. No s/s of distress or discomfort noted. Pt. C/o generalized pain during the night. Scheduled pain medication administered and effective. Pt. Resting during the night. Call light within reach. RN will continue to monitor pt. For changes in condition. Blood pressure 132/64, pulse 71, temperature 97.8 F (36.6 C), temperature source Oral, resp. rate 16, height 5\' 1"  (1.549 m), weight 69.491 kg (153 lb 3.2 oz), SpO2 93.00%. [name], [name]

## 2012-10-05 NOTE — Progress Notes (Signed)
10/04/12 1100 In to speak with pt. and daughter about discharge plans.  Daughter, Vernona Rieger, states she will be home with pt. until Friday, and then she has to go back to [facility].  Pt. is concerned about going home by herself, and is unsure she can be alone.  Obtained order for PT/OT consult, to assist with discharge disposition.  At this time, the pt. wants hospital bed delivered for home use, when she is dc.  This NCM spoke with Dr. Wylene Simmer to make aware of discharge plans.   Tera Mater, RN, BSN NCM [phone]

## 2012-10-05 NOTE — Clinical Documentation Improvement (Signed)
THIS DOCUMENT IS NOT A PERMANENT PART OF THE MEDICAL RECORD  Please update your documentation with the medical record to reflect your response to this query. If you need help knowing how to do this please call [phone].  10/05/12  Dr. Wylene Simmer,  In a better effort to capture your patient's severity of illness, reflect appropriate length of stay and utilization of resources, a review of the patient medical record has revealed the following indicators:   - Known 02 dependent COPD per H&P   - Refuses inhalers as outpatient, will add albuterol as needed per H&P   - Followed by Dr. Fannie Knee on a yearly basis.    Based on your clinical judgment, please document in the progress notes and discharge summary if a condition below provides greater specificity regarding the patient's respiratory condition:   - Chronic Respiratory Failure, on Home 02   - Other Condition   - Unable to Clinically Determine   In responding to this query please exercise your independent judgment.    The fact that a query is asked, does not imply that any particular answer is desired or expected.   Reviewed:  no additional documentation provided  Thank You,  Jerral Ralph  RN BSN CCDS Certified Clinical Documentation Specialist: [phone] Health Information Management 

## 2012-10-05 NOTE — Progress Notes (Signed)
10/05/12 1000 Pt. gave permission for this NCM to speak with daughter.  TC to Vernona Rieger, pt.'s daughter, [phone]), to inquire of discharge plans; as the hospital bed will be delivered today.  Vernona Rieger would like the pt. to go to SNF for short term rehab, and does not want the hospital bed.  This NCM and Lupita Leash, CSW, went in to speak with pt. about discharge plans.  The pt. agrees to go to [facility], and does not want the hospital bed delivered.  TC to Rayetta Humphrey representative [phone]) to make aware of dc order for hospital bed.  Anticipate dc today to SNF after insurance approval. Tera Mater, RN, BSN NCM [phone]

## 2012-10-06 NOTE — Clinical Social Work Psychosocial (Addendum)
    Clinical Social Work Department BRIEF PSYCHOSOCIAL ASSESSMENT 10/06/2012  Patient:  [name], [name]     Account Number:  [id number]     Admit date:  09/30/2012  Clinical Social Worker:  Tiburcio Pea  Date/Time:  10/05/2012 10:00 AM  Referred by:  RN  Date Referred:  10/05/2012 Referred for  SNF Placement   Other Referral:   Interview type:  Other - See comment Other interview type:   Met with patient; spoke to daughter [name] via phone    PSYCHOSOCIAL DATA Living Status:  ALONE Admitted from facility:   Level of care:   Primary support name:  [name]  (c) [phone] Primary support relationship to patient:  CHILD, ADULT Degree of support available:   Strong support    CURRENT CONCERNS Current Concerns  Post-Acute Placement   Other Concerns:    SOCIAL WORK ASSESSMENT / PLAN 78 year old female referred 10/05/12 for SNF short term placement prior to d/c home. She had intended to return home but it was then determined that her daughter would not be able to stay with her and she is now requesting short term SNF.  Bed search process and search initiated. Patient has [employer] which requires authorization. Fl2 completed and placed on chart for MD's signature.   Assessment/plan status:  Psychosocial Support/Ongoing Assessment of Needs Other assessment/ plan:   Information/referral to community resources:   SNF bed list provided to patient for review    PATIENT'S/FAMILY'S RESPONSE TO PLAN OF CARE: Patient is dissappointed that she cannot return home but verbalized that she would not be able able to manage well at home alone at this time. She hoped for SNF at [facility] and this choice will be investigated. She is agreeable to full search. CSW spoke with her daughter [name] who is aware of above.  [employer] auth request has been intiated. Per MD- patient is stable for d/c today.

## 2012-10-06 NOTE — Clinical Social Work Placement (Addendum)
    Clinical Social Work Department CLINICAL SOCIAL WORK PLACEMENT NOTE 10/06/2012  Patient:  [name], [name]  Account Number:  [id number] Admit date:  09/30/2012  Clinical Social Worker:  Lupita Leash Jojuan Champney, LCSWA  Date/time:  10/05/2012 01:00 PM  Clinical Social Work is seeking post-discharge placement for this patient at the following level of care:   SKILLED NURSING   (*CSW will update this form in Epic as items are completed)   10/05/2012  Patient/family provided with [facility] Department of Clinical Social Work's list of facilities offering this level of care within the geographic area requested by the patient (or if unable, by the patient's family).  10/05/2012  Patient/family informed of their freedom to choose among providers that offer the needed level of care, that participate in Medicare, Medicaid or managed care program needed by the patient, have an available bed and are willing to accept the patient.  10/05/2012  Patient/family informed of [facility]' ownership interest in [facility], as well as of the fact that they are under no obligation to receive care at this facility.  PASARR submitted to EDS on 10/05/2012 PASARR number received from EDS on 10/05/2012  FL2 transmitted to all facilities in geographic area requested by pt/family on  10/05/2012 FL2 transmitted to all facilities within larger geographic area on   Patient informed that his/her managed care company has contracts with or will negotiate with  certain facilities, including the following:   [employer] Medicare-  clinicals sent to [facility]     Patient/family informed of bed offers received:  10/05/2012 Patient chooses bed at [facility] Physician recommends and patient chooses bed at    Patient to be transferred to [facility] on  10/05/2012 Patient to be transferred to facility by Ambulance  Sharin Mons)  The following physician request  were entered in Epic:   Additional Comments: 10/04/12  Ok per MD for d/c today to SNF for short term rehab. Patien and daughter are agreeable to same.  Notified facility and pt's nurse to call report.  [employer] authorization obtained prior to d/c.  No further CSW needs identified. CSW signing off.  Lorri Frederick. Tidus Upchurch, LCSWA  [phone]

## 2013-05-16 ENCOUNTER — Emergency Department (HOSPITAL_COMMUNITY): Payer: Medicare PPO

## 2013-05-16 ENCOUNTER — Encounter (HOSPITAL_COMMUNITY): Payer: Self-pay | Admitting: Emergency Medicine

## 2013-05-16 ENCOUNTER — Observation Stay (HOSPITAL_COMMUNITY)
Admission: EM | Admit: 2013-05-16 | Discharge: 2013-05-18 | Disposition: A | Discharge status: Home Health | Payer: Medicare PPO | Attending: Internal Medicine | Admitting: Internal Medicine

## 2013-05-16 DIAGNOSIS — E876 Hypokalemia: Secondary | ICD-10-CM | POA: Insufficient documentation

## 2013-05-16 DIAGNOSIS — J449 Chronic obstructive pulmonary disease, unspecified: Secondary | ICD-10-CM | POA: Insufficient documentation

## 2013-05-16 DIAGNOSIS — R2689 Other abnormalities of gait and mobility: Secondary | ICD-10-CM

## 2013-05-16 DIAGNOSIS — M25462 Effusion, left knee: Secondary | ICD-10-CM | POA: Diagnosis present

## 2013-05-16 DIAGNOSIS — G4733 Obstructive sleep apnea (adult) (pediatric): Secondary | ICD-10-CM | POA: Insufficient documentation

## 2013-05-16 DIAGNOSIS — E119 Type 2 diabetes mellitus without complications: Secondary | ICD-10-CM | POA: Insufficient documentation

## 2013-05-16 DIAGNOSIS — M25562 Pain in left knee: Secondary | ICD-10-CM

## 2013-05-16 DIAGNOSIS — Z79899 Other long term (current) drug therapy: Secondary | ICD-10-CM | POA: Insufficient documentation

## 2013-05-16 DIAGNOSIS — E785 Hyperlipidemia, unspecified: Secondary | ICD-10-CM | POA: Insufficient documentation

## 2013-05-16 DIAGNOSIS — G8929 Other chronic pain: Secondary | ICD-10-CM | POA: Insufficient documentation

## 2013-05-16 DIAGNOSIS — M1120 Other chondrocalcinosis, unspecified site: Secondary | ICD-10-CM | POA: Insufficient documentation

## 2013-05-16 DIAGNOSIS — I10 Essential (primary) hypertension: Secondary | ICD-10-CM | POA: Insufficient documentation

## 2013-05-16 DIAGNOSIS — Z87891 Personal history of nicotine dependence: Secondary | ICD-10-CM | POA: Insufficient documentation

## 2013-05-16 DIAGNOSIS — M810 Age-related osteoporosis without current pathological fracture: Secondary | ICD-10-CM | POA: Insufficient documentation

## 2013-05-16 DIAGNOSIS — M1990 Unspecified osteoarthritis, unspecified site: Secondary | ICD-10-CM | POA: Insufficient documentation

## 2013-05-16 DIAGNOSIS — M25569 Pain in unspecified knee: Secondary | ICD-10-CM | POA: Insufficient documentation

## 2013-05-16 DIAGNOSIS — M25469 Effusion, unspecified knee: Secondary | ICD-10-CM | POA: Insufficient documentation

## 2013-05-16 DIAGNOSIS — J4489 Other specified chronic obstructive pulmonary disease: Secondary | ICD-10-CM | POA: Insufficient documentation

## 2013-05-16 HISTORY — DX: Unspecified osteoarthritis, unspecified site: M19.90

## 2013-05-16 MED ORDER — LIDOCAINE HCL (PF) 1 % IJ SOLN
30.0000 mL | Freq: Once | INTRAMUSCULAR | Status: AC
  Administered 2013-05-17: 30 mL via INTRADERMAL
  Filled 2013-05-16: qty 30

## 2013-05-16 MED ORDER — SODIUM CHLORIDE 0.9 % IV BOLUS (SEPSIS)
500.0000 mL | Freq: Once | INTRAVENOUS | Status: AC
  Administered 2013-05-16: 500 mL via INTRAVENOUS

## 2013-05-16 MED ORDER — HYDROMORPHONE HCL PF 1 MG/ML IJ SOLN
0.5000 mg | Freq: Once | INTRAMUSCULAR | Status: AC
  Administered 2013-05-16: 0.5 mg via INTRAVENOUS
  Filled 2013-05-16: qty 1

## 2013-05-16 NOTE — ED Notes (Signed)
Pt presented by EMS from home, pt c/o knee pain 10/10 that radiates to both the ankle and hip, denies recent injury/trauma, states pain onset was idiopathic, states I was getting out of bed when I had sudden pain, states she has not been able to eat for the last 2 days, remarks on hx of arthritis in her knees of which she has been treated for with "gel shoots." Pt received 129mcg of Fentanyl en route, which decreased her pain to 8/10. Pt in NAD.

## 2013-05-16 NOTE — ED Notes (Signed)
Bed: WA17 Expected date:  Expected time:  Means of arrival:  Comments: EMS knee swelling / pain

## 2013-05-17 DIAGNOSIS — M25462 Effusion, left knee: Secondary | ICD-10-CM | POA: Diagnosis present

## 2013-05-17 LAB — SYNOVIAL CELL COUNT + DIFF, W/ CRYSTALS
Lymphocytes-Synovial Fld: 1 % (ref 0–20)
MONOCYTE-MACROPHAGE-SYNOVIAL FLUID: 6 % — AB (ref 50–90)
Neutrophil, Synovial: 93 % — ABNORMAL HIGH (ref 0–25)
WBC, Synovial: 33825 /mm3 — ABNORMAL HIGH (ref 0–200)

## 2013-05-17 LAB — CBC WITH DIFFERENTIAL/PLATELET
BASOS ABS: 0 10*3/uL (ref 0.0–0.1)
Basophils Relative: 0 % (ref 0–1)
Eosinophils Absolute: 0 10*3/uL (ref 0.0–0.7)
Eosinophils Relative: 0 % (ref 0–5)
HCT: 40.2 % (ref 36.0–46.0)
HEMOGLOBIN: 13 g/dL (ref 12.0–15.0)
LYMPHS PCT: 7 % — AB (ref 12–46)
Lymphs Abs: 0.8 10*3/uL (ref 0.7–4.0)
MCH: 28.4 pg (ref 26.0–34.0)
MCHC: 32.3 g/dL (ref 30.0–36.0)
MCV: 88 fL (ref 78.0–100.0)
MONO ABS: 1.5 10*3/uL — AB (ref 0.1–1.0)
MONOS PCT: 14 % — AB (ref 3–12)
NEUTROS ABS: 8.1 10*3/uL — AB (ref 1.7–7.7)
NEUTROS PCT: 78 % — AB (ref 43–77)
Platelets: 252 10*3/uL (ref 150–400)
RBC: 4.57 MIL/uL (ref 3.87–5.11)
RDW: 15.8 % — AB (ref 11.5–15.5)
WBC: 10.5 10*3/uL (ref 4.0–10.5)

## 2013-05-17 LAB — SEDIMENTATION RATE: Sed Rate: 42 mm/hr — ABNORMAL HIGH (ref 0–22)

## 2013-05-17 LAB — GLUCOSE, CAPILLARY
GLUCOSE-CAPILLARY: 113 mg/dL — AB (ref 70–99)
GLUCOSE-CAPILLARY: 148 mg/dL — AB (ref 70–99)
Glucose-Capillary: 149 mg/dL — ABNORMAL HIGH (ref 70–99)
Glucose-Capillary: 153 mg/dL — ABNORMAL HIGH (ref 70–99)

## 2013-05-17 LAB — GLUCOSE, SYNOVIAL FLUID: GLUCOSE, SYNOVIAL FLUID: 59 mg/dL

## 2013-05-17 LAB — BASIC METABOLIC PANEL
BUN: 19 mg/dL (ref 6–23)
CHLORIDE: 101 meq/L (ref 96–112)
CO2: 24 mEq/L (ref 19–32)
Calcium: 9.9 mg/dL (ref 8.4–10.5)
Creatinine, Ser: 0.78 mg/dL (ref 0.50–1.10)
GFR calc non Af Amer: 79 mL/min — ABNORMAL LOW (ref >90)
Glucose, Bld: 101 mg/dL — ABNORMAL HIGH (ref 70–99)
POTASSIUM: 3.9 meq/L (ref 3.7–5.3)
Sodium: 140 mEq/L (ref 137–147)

## 2013-05-17 MED ORDER — GABAPENTIN 300 MG PO CAPS
600.0000 mg | ORAL_CAPSULE | Freq: Every day | ORAL | Status: DC
  Administered 2013-05-17: 600 mg via ORAL
  Filled 2013-05-17 (×2): qty 2

## 2013-05-17 MED ORDER — DEXTROSE 5 % IV SOLN
1.0000 g | Freq: Once | INTRAVENOUS | Status: AC
  Administered 2013-05-17: 1 g via INTRAVENOUS
  Filled 2013-05-17: qty 10

## 2013-05-17 MED ORDER — POTASSIUM CHLORIDE CRYS ER 10 MEQ PO TBCR
10.0000 meq | EXTENDED_RELEASE_TABLET | Freq: Every day | ORAL | Status: DC
  Administered 2013-05-17 – 2013-05-18 (×2): 10 meq via ORAL
  Filled 2013-05-17 (×2): qty 1

## 2013-05-17 MED ORDER — AMLODIPINE BESYLATE 5 MG PO TABS
15.0000 mg | ORAL_TABLET | Freq: Every day | ORAL | Status: DC
  Administered 2013-05-17 – 2013-05-18 (×2): 15 mg via ORAL
  Filled 2013-05-17 (×2): qty 1

## 2013-05-17 MED ORDER — GLUCERNA SHAKE PO LIQD
237.0000 mL | Freq: Once | ORAL | Status: DC
  Filled 2013-05-17: qty 237

## 2013-05-17 MED ORDER — ENOXAPARIN SODIUM 40 MG/0.4ML ~~LOC~~ SOLN
40.0000 mg | SUBCUTANEOUS | Status: DC
  Administered 2013-05-17 – 2013-05-18 (×2): 40 mg via SUBCUTANEOUS
  Filled 2013-05-17 (×2): qty 0.4

## 2013-05-17 MED ORDER — SODIUM CHLORIDE 0.9 % IV SOLN
INTRAVENOUS | Status: AC
  Administered 2013-05-17: 06:00:00 via INTRAVENOUS

## 2013-05-17 MED ORDER — PANTOPRAZOLE SODIUM 40 MG PO TBEC
40.0000 mg | DELAYED_RELEASE_TABLET | Freq: Every day | ORAL | Status: DC
  Administered 2013-05-17 – 2013-05-18 (×2): 40 mg via ORAL
  Filled 2013-05-17 (×2): qty 1

## 2013-05-17 MED ORDER — GLUCERNA SHAKE PO LIQD
237.0000 mL | ORAL | Status: DC
  Filled 2013-05-17: qty 237

## 2013-05-17 MED ORDER — METHYLPREDNISOLONE SODIUM SUCC 125 MG IJ SOLR
60.0000 mg | Freq: Once | INTRAMUSCULAR | Status: AC
  Administered 2013-05-17: 60 mg via INTRAVENOUS
  Filled 2013-05-17: qty 0.96

## 2013-05-17 MED ORDER — LOSARTAN POTASSIUM 50 MG PO TABS
50.0000 mg | ORAL_TABLET | Freq: Every day | ORAL | Status: DC
  Administered 2013-05-17 – 2013-05-18 (×2): 50 mg via ORAL
  Filled 2013-05-17 (×2): qty 1

## 2013-05-17 MED ORDER — INSULIN ASPART 100 UNIT/ML ~~LOC~~ SOLN: 0.0000 [IU] | Freq: Every day | SUBCUTANEOUS | Status: DC

## 2013-05-17 MED ORDER — TRAMADOL HCL 50 MG PO TABS
50.0000 mg | ORAL_TABLET | Freq: Two times a day (BID) | ORAL | Status: DC
  Administered 2013-05-17 – 2013-05-18 (×2): 50 mg via ORAL
  Filled 2013-05-17 (×3): qty 1

## 2013-05-17 MED ORDER — ATORVASTATIN CALCIUM 80 MG PO TABS
80.0000 mg | ORAL_TABLET | Freq: Every day | ORAL | Status: DC
  Administered 2013-05-17 – 2013-05-18 (×2): 80 mg via ORAL
  Filled 2013-05-17 (×2): qty 1

## 2013-05-17 MED ORDER — GABAPENTIN 600 MG PO TABS
600.0000 mg | ORAL_TABLET | Freq: Every day | ORAL | Status: DC
  Filled 2013-05-17: qty 1

## 2013-05-17 MED ORDER — INSULIN ASPART 100 UNIT/ML ~~LOC~~ SOLN
0.0000 [IU] | Freq: Three times a day (TID) | SUBCUTANEOUS | Status: DC
Start: 2013-05-17 — End: 2013-05-18
  Administered 2013-05-17: 2 [IU] via SUBCUTANEOUS
  Administered 2013-05-18: 3 [IU] via SUBCUTANEOUS

## 2013-05-17 MED ORDER — HYDROMORPHONE HCL PF 1 MG/ML IJ SOLN
0.5000 mg | Freq: Once | INTRAMUSCULAR | Status: AC
  Administered 2013-05-17: 0.5 mg via INTRAVENOUS
  Filled 2013-05-17: qty 1

## 2013-05-17 NOTE — Progress Notes (Signed)
Clinical Social Work Department CLINICAL SOCIAL WORK PLACEMENT NOTE 05/17/2013  Patient:  [name], [name]  Account Number:  [id number] Admit date:  05/16/2013  Clinical Social Worker:  Werner Lean, LCSW  Date/time:  05/17/2013 11:19 AM  Clinical Social Work is seeking post-discharge placement for this patient at the following level of care:   SKILLED NURSING   (*CSW will update this form in Epic as items are completed)   05/17/2013  Patient/family provided with [facility] Department of Clinical Social Work's list of facilities offering this level of care within the geographic area requested by the patient (or if unable, by the patient's family).  05/17/2013  Patient/family informed of their freedom to choose among providers that offer the needed level of care, that participate in Medicare, Medicaid or managed care program needed by the patient, have an available bed and are willing to accept the patient.    Patient/family informed of [facility]' ownership interest in [facility], as well as of the fact that they are under no obligation to receive care at this facility.  PASARR submitted to EDS on 05/17/2013 PASARR number received from EDS on 05/17/2013  FL2 transmitted to all facilities in geographic area requested by pt/family on  05/17/2013 FL2 transmitted to all facilities within larger geographic area on   Patient informed that his/her managed care company has contracts with or will negotiate with  certain facilities, including the following:     Patient/family informed of bed offers received:   Patient chooses bed at  Physician recommends and patient chooses bed at    Patient to be transferred to  on   Patient to be transferred to facility by   The following physician request were entered in Epic:   Additional Comments:  Werner Lean LCSW [phone]

## 2013-05-17 NOTE — ED Provider Notes (Signed)
CSN: [id number]     Arrival date & time 05/16/13  2243 History   First MD Initiated Contact with Patient 05/16/13 2259     Chief Complaint  Patient presents with  . Knee Pain  . Joint Swelling     (Consider location/radiation/quality/duration/timing/severity/associated sxs/prior Treatment) HPI Comments: 79 year old female with history of lipids, hypertension, sleep apnea on CPAP and home O2, diabetes, COPD presents with worsening left knee pain and swelling since Saturday. Patient has had mild knee pain and arthritis the past but not this severe. No injury recalled, no fevers and no history of infected joint. No history of knee surgery on the left. Patient can barely put weight on the leg due to knee pain. Patient received m identified on route with mild improvement in the pain. No history of rheumatoid arthritis or gout known.  Patient is a 79 y.o. female presenting with knee pain. The history is provided by the patient.  Knee Pain Associated symptoms: no back pain, no fever and no neck pain     Past Medical History  Diagnosis Date  . Diabetes mellitus   . Hyperlipidemia   . Hypertension   . Sleep apnea     AHI 89/hr  . Allergic rhinitis   . COPD (chronic obstructive pulmonary disease)     PFT 08/08/09-FEV1 1.98/107; R 0.77; small airway obst w/tresp to dilator;DLCO 44%  . Fibromuscular dysplasia     RAS  . Complication of anesthesia     Hard time waking up  . PONV (postoperative nausea and vomiting)   . Arthritis    Past Surgical History  Procedure Laterality Date  . Pituitary tumor removed      x 2  . Back surgery  2005  . Tonsillectomy    . Nasal septal deviation    . Bilateral renal bypass    . Esophagogastroduodenoscopy N/A 10/01/2012    Procedure: ESOPHAGOGASTRODUODENOSCOPY (EGD);  Surgeon: Missy Sabins, MD;  Location: [facility] ENDOSCOPY;  Service: Endoscopy;  Laterality: N/A;   Family History  Problem Relation Age of Onset  . Stroke Father   . Breast cancer Mother     History  Substance Use Topics  . Smoking status: Former Smoker -- 1.00 packs/day for 35 years    Types: Cigarettes    Quit date: 02/17/1997  . Smokeless tobacco: Former Systems developer  . Alcohol Use: No   OB History   Grav Para Term Preterm Abortions TAB SAB Ect Mult Living                 Review of Systems  Constitutional: Positive for appetite change. Negative for fever and chills.  HENT: Negative for congestion.   Eyes: Negative for visual disturbance.  Respiratory: Negative for shortness of breath.   Cardiovascular: Negative for chest pain.  Gastrointestinal: Negative for vomiting and abdominal pain.  Genitourinary: Negative for flank pain.  Musculoskeletal: Positive for arthralgias, gait problem and joint swelling. Negative for back pain, neck pain and neck stiffness.  Skin: Negative for rash.  Neurological: Negative for weakness, light-headedness, numbness and headaches.      Allergies  Valacyclovir hcl  Home Medications   Current Outpatient Rx  Name  Route  Sig  Dispense  Refill  . ACCU-CHEK AVIVA PLUS test strip               . amLODipine (NORVASC) 10 MG tablet   Oral   Take 15 mg by mouth daily.           Marland Kitchen  atorvastatin (LIPITOR) 80 MG tablet   Oral   Take 80 mg by mouth daily.           . Calcium Carbonate-Vit D-Min (CALCIUM 1200 PO)   Oral   Take 1 capsule by mouth daily.          Marland Kitchen gabapentin (NEURONTIN) 600 MG tablet   Oral   Take 600 mg by mouth daily.           Marland Kitchen losartan (COZAAR) 50 MG tablet   Oral   Take 1 tablet by mouth daily.         . potassium chloride (KLOR-CON) 10 MEQ CR tablet   Oral   Take 10 mEq by mouth daily.           . traMADol (ULTRAM) 50 MG tablet   Oral   Take 1 tablet (50 mg total) by mouth every 12 (twelve) hours.   60 tablet   1    BP 152/66  Pulse 73  Temp(Src) 99.2 F (37.3 C) (Oral)  Resp 14  SpO2 96% Physical Exam  Nursing note and vitals reviewed. Constitutional: She is oriented to person,  place, and time. She appears well-developed and well-nourished.  HENT:  Head: Normocephalic and atraumatic.  Eyes: Conjunctivae are normal. Right eye exhibits no discharge. Left eye exhibits no discharge.  Neck: Normal range of motion. Neck supple. No tracheal deviation present.  Cardiovascular: Normal rate and regular rhythm.   Pulmonary/Chest: Effort normal and breath sounds normal.  Abdominal: Soft. She exhibits no distension. There is no tenderness. There is no guarding.  Musculoskeletal: She exhibits edema and tenderness.  Patient has significant tenderness and swelling to the left knee. Difficult to do detailed exam due to pain and swelling. Patient able to raise left leg mild extension of the knee however stops due to pain. Mild warmth to the knee however no rash or induration. No tenderness posterior leg majority is anterior and lateral knee. Difficult to assess ligament stability at this time. Vascular intact distal to the left knee.  Neurological: She is alert and oriented to person, place, and time.  Skin: Skin is warm. No rash noted.  Psychiatric: She has a normal mood and affect.    ED Course  Procedures (including critical care time) Left knee joint aspiration/ arthrocentesis Discussed risks and benefits including bleeding infection and pain. Patient wishes to proceed. Chlorhexidine used to sterilize the left knee. 5 mL's of lidocaine used to numb the surface and entrance into the knee joint. 18-gauge needle used to aspirate yellow fluid total of 55 mL. Bandage placed after procedure no complications. Sterile technique used.  Labs Review Labs Reviewed  BASIC METABOLIC PANEL - Abnormal; Notable for the following:    Glucose, Bld 101 (*)    GFR calc non Af Amer 79 (*)    All other components within normal limits  CBC WITH DIFFERENTIAL - Abnormal; Notable for the following:    RDW 15.8 (*)    Neutrophils Relative % 78 (*)    Neutro Abs 8.1 (*)    Lymphocytes Relative 7 (*)     Monocytes Relative 14 (*)    Monocytes Absolute 1.5 (*)    All other components within normal limits  SEDIMENTATION RATE - Abnormal; Notable for the following:    Sed Rate 42 (*)    All other components within normal limits  SYNOVIAL CELL COUNT + DIFF, W/ CRYSTALS - Abnormal; Notable for the following:    Color, Synovial YELLOW (*)  Appearance-Synovial TURBID (*)    All other components within normal limits  BODY FLUID CULTURE  BODY FLUID CRYSTAL  BODY FLUID CELL COUNT WITH DIFFERENTIAL  GLUCOSE, SYNOVIAL FLUID   Imaging Review Dg Knee Complete 4 Views Left  05/17/2013   CLINICAL DATA:  Sudden onset of severe knee pain.  No known injury.  EXAM: LEFT KNEE - COMPLETE 4+ VIEW  COMPARISON:  None.  FINDINGS: Large suprapatellar knee joint effusion. No acute fracture or malalignment. Mild tricompartmental degenerative changes. Atherosclerotic calcifications noted in the superficial femoral and popliteal arteries. Surgical clips in the medial soft tissues suggest prior saphenous vein harvesting.  IMPRESSION: Large suprapatellar joint effusion. Differential considerations include spontaneous hemarthrosis, septic joint, and degenerative synovitis.  Atherosclerotic calcifications.   Electronically Signed   By: Jacqulynn Cadet M.D.   On: 05/17/2013 01:39     EKG Interpretation None      MDM   Final diagnoses:  Knee effusion, left  Left knee pain  Inability to bear weight   With significant swelling and tenderness to the left knee. No other joints involved at this time. Concern for severe arthritis with effusion versus infected affected knee joint versus other arthritis. Plan for blood work, pain medicines, x-ray and arthrocentesis of the left knee. Discussed risks and benefits of placing a needle in her left knee joint obtained fluid sent off to lab. Patient understands the risks including infection pain, bleeding and we will pursue that procedure shortly.  knee aspiration completed  with yellow fluid cultures and differential pending\ Patient unable to bear weight due to pain. Multiple doses of narcotics required an EGD. Patient has improved in the ER.  [facility] Dr. Haynes Kerns who recommends Rocephin, Solu-Medrol and agrees with observation on general medicine. He will see patient early this morning. The patients results and plan were reviewed and discussed.   Any x-rays performed were personally reviewed by myself.   Differential diagnosis were considered with the presenting HPI.   Filed Vitals:   05/16/13 2245 05/16/13 2247 05/16/13 2248  BP: 152/66    Pulse: 54  73  Temp: 99.2 F (37.3 C)    TempSrc: Oral    Resp: 14    SpO2: 76% 96% 96%    Admission/ observation were discussed with the admitting physician, patient and/or family and they are comfortable with the plan.     Mariea Clonts, MD 05/17/13 [phone]

## 2013-05-17 NOTE — Progress Notes (Signed)
Nutrition Brief Note  Patient identified on the Malnutrition Screening Tool (MST) Report  Wt Readings from Last 15 Encounters:  05/17/13 153 lb (69.4 kg)  10/05/12 153 lb 3.2 oz (69.491 kg)  10/05/12 153 lb 3.2 oz (69.491 kg)  09/12/11 177 lb 6.4 oz (80.468 kg)  08/14/11 180 lb 12.8 oz (82.01 kg)  07/10/11 181 lb 12.8 oz (82.464 kg)  06/27/11 186 lb (84.369 kg)  06/27/11 186 lb (84.369 kg)  05/13/11 187 lb (84.823 kg)  05/09/11 186 lb (84.369 kg)  03/19/11 190 lb (86.183 kg)  03/14/11 190 lb (86.183 kg)  01/29/11 194 lb (87.998 kg)  02/27/10 196 lb (88.905 kg)  01/29/10 199 lb (90.266 kg)    Body mass index is 28.92 kg/(m^2). Patient meets criteria for Overweight based on current BMI.   Current diet order is Carb Modified, patient is consuming approximately 50% of meals at this time. Labs and medications reviewed.   Pt reported consuming two meals w/one Carb Control Boost/day.  Eats balanced meals and avoids fatty foods to assist with her previous GI ulcer and for blood glucose control. Has had an intentional 40 lbs weight loss over several years through diet modification and as method to decrease dependency on DM medications. Weight has stabilized. Denied any recent changes in appetite.   Will order Glucerna shakes once daily to follow supplement regimen pt follows at home  No additional nutrition interventions warranted at this time. If nutrition issues arise, please consult RD.   Atlee Abide MS RD LDN Clinical Dietitian [phone]

## 2013-05-17 NOTE — Progress Notes (Signed)
CSW assisting with d/c planning. FL2 in chart for MD signature. PT has recommended ST SNF placement . Pt is considering this option. "I know I can't return home if I can't walk." [facility] does have an opening and has contacted [employer] for authorization. CSW will continue to follow to assist with d/c planning.  Werner Lean LCSW [phone]

## 2013-05-17 NOTE — Progress Notes (Signed)
Clinical Social Work Department BRIEF PSYCHOSOCIAL ASSESSMENT 05/17/2013  Patient:  [name], [name]     Account Number:  [id number]     Admit date:  05/16/2013  Clinical Social Worker:  Lacie Scotts  Date/Time:  05/17/2013 10:43 AM  Referred by:  Physician  Date Referred:  05/17/2013 Referred for  SNF Placement   Other Referral:   Interview type:  Patient Other interview type:    PSYCHOSOCIAL DATA Living Status:  ALONE Admitted from facility:   Level of care:   Primary support name:  myrical andujo Primary support relationship to patient:  CHILD, ADULT Degree of support available:   unclear    CURRENT CONCERNS Current Concerns  Post-Acute Placement   Other Concerns:    SOCIAL WORK ASSESSMENT / PLAN Pt is a 79 yr old female living at home, alone, prior to hospitalization.CSW met with pt to assist with d/c planning. Ortho consult and PT eval are pending. Pt may need ST Rehab following hospital d/c. Pt has been to [facility]  in the past and will consider returning there for rehab. SNF search initiated and bed offers to be provided as received. Pt has [employer] which requires prior authorization. CSW will begin this process once PT eval / recommendations are available.   Assessment/plan status:  Psychosocial Support/Ongoing Assessment of Needs Other assessment/ plan:   Information/referral to community resources:   SNF list provided. Insurance coverage for placement and transport reviewed.    PATIENT'S/FAMILY'S RESPONSE TO PLAN OF CARE: Pt complains of increased pain when trying to bear weight on her left knee. Medication has helped. Pt feels rehab may be needed until her knee has improved.

## 2013-05-17 NOTE — H&P (Signed)
PCP:   [name], MD   Chief Complaint:  L knee pain  HPI: [name] is a 79 year old female last seen in my office following a hospitalization for GI bleed due to NSAID use and ulcer.  She has chronic Orthopedic issues and last had a shoulder injection in December per Dr. Layne Benton.  Her knees have been a long term issue for her and I believe she has had Synvisc injections in the past, although I have no current Ortho notes on record.  She states her last was per Dr. Lynann Bologna in the fall and is scheduled for another on April 30th.  She comes to the ER after 2 days of not being able to bear weight on her L knee and 10/10 pain.  She was able to get down the stairs late Saturday but has otherwise remained in a chair until finally calling EMS last night.  The pain was severe enough to cause her nausea and she did not have the ability to stand and make her own food due to pain and disability from the knee.  She has not contacted our office or Dr. Laurena Bering regarding this.  ER provided pain relief with narcotics and tapped her knee with the fluid showing CPPD crystals consistent with pseudogout.  Was given empiric abx and steroids in the ER.  I was asked to admit her for ongoing pain management.  Review of Systems:  Negative except for pain and swelling of L knee and some nausea, otherwise negative on 12 point review.  Past Medical History: Past Medical History  Diagnosis Date  . Diabetes mellitus   . Hyperlipidemia   . Hypertension   . Sleep apnea     AHI 89/hr  . Allergic rhinitis   . COPD (chronic obstructive pulmonary disease)     PFT 08/08/09-FEV1 1Breanna98/107; R 0Breanna77; small airway obst Castillo/tresp to dilator;DLCO 44%  . Fibromuscular dysplasia     RAS  . Complication of anesthesia     Hard time waking up  . PONV (postoperative nausea and vomiting)   . Arthritis    Past Surgical History  Procedure Laterality Date  . Pituitary tumor removed      x 2  . Back surgery  2005  .  Tonsillectomy    . Nasal septal deviation    . Bilateral renal bypass    . Esophagogastroduodenoscopy N/A 10/01/2012    Procedure: ESOPHAGOGASTRODUODENOSCOPY (EGD);  Surgeon: Missy Sabins, MD;  Location: [facility] ENDOSCOPY;  Service: Endoscopy;  Laterality: N/A;    Medications: Prior to Admission medications   Medication Sig Start Date End Date Taking? Authorizing Provider  ACCU-CHEK AVIVA PLUS test strip  05/17/11  Yes Historical Provider, MD  amLODipine (NORVASC) 10 MG tablet Take 15 mg by mouth daily.     Yes Historical Provider, MD  atorvastatin (LIPITOR) 80 MG tablet Take 80 mg by mouth daily.     Yes Historical Provider, MD  Calcium Carbonate-Vit D-Min (CALCIUM 1200 PO) Take 1 capsule by mouth daily.    Yes Historical Provider, MD  gabapentin (NEURONTIN) 600 MG tablet Take 600 mg by mouth daily.     Yes Historical Provider, MD  losartan (COZAAR) 50 MG tablet Take 1 tablet by mouth daily. 01/14/11  Yes Historical Provider, MD  potassium chloride (KLOR-CON) 10 MEQ CR tablet Take 10 mEq by mouth daily.     Yes Historical Provider, MD  traMADol (ULTRAM) 50 MG tablet Take 1 tablet (50 mg total) by mouth every  12 (twelve) hours. 10/03/12  Yes Sheela Stack, MD    Allergies:   Allergies  Allergen Reactions  . Valacyclovir Hcl     Social History:  reports that she quit smoking about 16 years ago. Her smoking use included Cigarettes. She has a 35 pack-year smoking history. She has quit using smokeless tobacco. She reports that she does not drink alcohol or use illicit drugs.  Family History: Family History  Problem Relation Age of Onset  . Stroke Father   . Breast cancer Mother     Physical Exam: Filed Vitals:   05/16/13 2247 05/16/13 2248 05/17/13 0508 05/17/13 0528  BP:   112/88   Pulse:  73 85   Temp:   98 F (36Breanna7 C)   TempSrc:   Oral   Resp:   18   Height:    5\' 1"  (1Breanna549 m)  Weight:    69Breanna4 kg (153 lb)  SpO2: 96% 96% 99%    General appearance: alert, cooperative and  appears stated age Head: Normocephalic, without obvious abnormality, atraumatic Eyes: conjunctivae/corneas clear. PERRL, EOM's intact.  Nose: Nares normal. Septum midline. Mucosa normal. No drainage or sinus tenderness. Throat: lips, mucosa, and tongue normal; teeth and gums normal Neck: no adenopathy, no carotid bruit, no JVD and thyroid not enlarged, symmetric, no tenderness/mass/nodules Resp: Coarse upper sounds throughout Cardio: regular rate and rhythm, S1, S2 normal, no murmur, click, rub or gallop GI: soft, non-tender; bowel sounds normal; no masses,  no organomegaly Extremities: L knee with mild warmth, fluid in joint still visible Pulses: 2+ and symmetric Lymph nodes: Cervical adenopathy: no cervical lymphadenopathy Neurologic: Alert and oriented X 3, normal strength and tone. Normal symmetric reflexes.     Labs on Admission:   Recent Labs  05/17/13 0010  NA 140  K 3Breanna9  CL 101  CO2 24  GLUCOSE 101*  BUN 19  CREATININE 0Breanna78  CALCIUM 9Breanna9    Recent Labs  05/17/13 0010  WBC 10Breanna5  NEUTROABS 8Breanna1*  HGB 13Breanna0  HCT 40Breanna2  MCV 88Breanna0  PLT 252    Lab Results  Component Value Date   INR 0Breanna9 06/24/2011    Radiological Exams on Admission: Dg Knee Complete 4 Views Left  05/17/2013   CLINICAL DATA:  Sudden onset of severe knee pain.  No known injury.  EXAM: LEFT KNEE - COMPLETE 4+ VIEW  COMPARISON:  None.  FINDINGS: Large suprapatellar knee joint effusion. No acute fracture or malalignment. Mild tricompartmental degenerative changes. Atherosclerotic calcifications noted in the superficial femoral and popliteal arteries. Surgical clips in the medial soft tissues suggest prior saphenous vein harvesting.  IMPRESSION: Large suprapatellar joint effusion. Differential considerations include spontaneous hemarthrosis, septic joint, and degenerative synovitis.  Atherosclerotic calcifications.   Electronically Signed   By: Jacqulynn Cadet MBreannaD.   On: 05/17/2013 01:39   Orders placed  during the hospital encounter of 09/30/12  . EKG 12-LEAD  . EKG 12-LEAD  . EKG    Assessment/Plan Active Problems:   Knee effusion, left Crystals consistent with Pseudogout.   Was given steroids as well as empiric abx in the ER (Rocephin) with culture pending.  Has seen Dr. Lynann Bologna in the past for Orthopedic issues and may require continued long term management.  Will ask her or covering MD to consult.  Of note, patient had GI bleed last August and subsequent EGD as well as f/u EGD in Dec with recs not to continue NSAIDs.  Has had Synvisc or similar in the fall,  but doubt this is related to that injection specifically per time course.  Prior GI Bleed: PPI indefinitely  Osteoporosis:  History of pelvic fracture.  Was previously on Reclast, but refused latest treatment.  Had also refused Forteo in the past per her prior MD despite progressive osteoporosis on DEXA.  HTN: Continune ARB/CCB  Hyperlipidemia: Continue statin  COPD/OSA: Continue oxygen as needed, usually nocturnal per Dr. Annamaria Boots, and OSA treatment.  Consistently refuses daily inhalers.  Has a degree of emphysema from prior tobacco  IGT:  Mild, benefit of short term steroids outweighs risk  Plan: Ortho consult, pain management, may need to consider short term SNF until able to ambulate again.  Social work/PT evals.  Her daughter will be here tomorrow afternoon.  [name],. Castillo states that since her daughter works, she cannot take care of her and would consiider going back to [place] (where she was after her GI bleed in August 2014) for short term.  [name] 05/17/2013, 6:25 AM

## 2013-05-17 NOTE — Consult Note (Signed)
Reason for Consult:left knee pain Referring Physician: Tisovec  [name] is an 79 y.o. female.  HPI: Patient has had exquisite knee pain and swelling since Saturday.  Yesterday 40+ cc of fluid was aspirated from the joint.  Showed pseudogout.  Today I am here to inject her knee  Past Medical History  Diagnosis Date  . Diabetes mellitus   . Hyperlipidemia   . Hypertension   . Sleep apnea     AHI 89/hr  . Allergic rhinitis   . COPD (chronic obstructive pulmonary disease)     PFT 08/08/09-FEV1 1.98/107; R 0.77; small airway obst w/tresp to dilator;DLCO 44%  . Fibromuscular dysplasia     RAS  . Complication of anesthesia     Hard time waking up  . PONV (postoperative nausea and vomiting)   . Arthritis     Past Surgical History  Procedure Laterality Date  . Pituitary tumor removed      x 2  . Back surgery  2005  . Tonsillectomy    . Nasal septal deviation    . Bilateral renal bypass    . Esophagogastroduodenoscopy N/A 10/01/2012    Procedure: ESOPHAGOGASTRODUODENOSCOPY (EGD);  Surgeon: Missy Sabins, MD;  Location: [facility] ENDOSCOPY;  Service: Endoscopy;  Laterality: N/A;    Family History  Problem Relation Age of Onset  . Stroke Father   . Breast cancer Mother     Social History:  reports that she quit smoking about 16 years ago. Her smoking use included Cigarettes. She has a 35 pack-year smoking history. She has quit using smokeless tobacco. She reports that she does not drink alcohol or use illicit drugs.  Allergies:  Allergies  Allergen Reactions  . Valacyclovir Hcl     Medications: I have reviewed the patient's current medications.  Results for orders placed during the hospital encounter of 05/16/13 (from the past 48 hour(s))  BASIC METABOLIC PANEL     Status: Abnormal   Collection Time    05/17/13 12:10 AM      Result Value Ref Range   Sodium 140  137 - 147 mEq/L   Potassium 3.9  3.7 - 5.3 mEq/L   Chloride 101  96 - 112 mEq/L   CO2 24  19 - 32 mEq/L   Glucose, Bld 101 (*) 70 - 99 mg/dL   BUN 19  6 - 23 mg/dL   Creatinine, Ser 0.78  0.50 - 1.10 mg/dL   Calcium 9.9  8.4 - 10.5 mg/dL   GFR calc non Af Amer 79 (*) >90 mL/min   GFR calc Af Amer >90  >90 mL/min   Comment: (NOTE)     The eGFR has been calculated using the CKD EPI equation.     This calculation has not been validated in all clinical situations.     eGFR's persistently <90 mL/min signify possible Chronic Kidney     Disease.  CBC WITH DIFFERENTIAL     Status: Abnormal   Collection Time    05/17/13 12:10 AM      Result Value Ref Range   WBC 10.5  4.0 - 10.5 K/uL   RBC 4.57  3.87 - 5.11 MIL/uL   Hemoglobin 13.0  12.0 - 15.0 g/dL   HCT 40.2  36.0 - 46.0 %   MCV 88.0  78.0 - 100.0 fL   MCH 28.4  26.0 - 34.0 pg   MCHC 32.3  30.0 - 36.0 g/dL   RDW 15.8 (*) 11.5 - 15.5 %  Platelets 252  150 - 400 K/uL   Neutrophils Relative % 78 (*) 43 - 77 %   Neutro Abs 8.1 (*) 1.7 - 7.7 K/uL   Lymphocytes Relative 7 (*) 12 - 46 %   Lymphs Abs 0.8  0.7 - 4.0 K/uL   Monocytes Relative 14 (*) 3 - 12 %   Monocytes Absolute 1.5 (*) 0.1 - 1.0 K/uL   Eosinophils Relative 0  0 - 5 %   Eosinophils Absolute 0.0  0.0 - 0.7 K/uL   Basophils Relative 0  0 - 1 %   Basophils Absolute 0.0  0.0 - 0.1 K/uL  SEDIMENTATION RATE     Status: Abnormal   Collection Time    05/17/13 12:10 AM      Result Value Ref Range   Sed Rate 42 (*) 0 - 22 mm/hr  BODY FLUID CULTURE     Status: None   Collection Time    05/17/13  2:47 AM      Result Value Ref Range   Specimen Description SYNOVIAL     Special Requests Normal     Gram Stain       Value: NO WBC SEEN     NO ORGANISMS SEEN     Performed at [facility]   Culture PENDING     Report Status PENDING    GLUCOSE, SYNOVIAL FLUID     Status: None   Collection Time    05/17/13  2:47 AM      Result Value Ref Range   Glucose, Synovial Fluid 59     Comment:            THE NORMAL FLUID LEVEL IS USUALLY     EQUAL TO OR SLIGHTLY LESS THAN     THE  NORMAL SERUM LEVEL (WITHIN     10 mg/dL).     Performed at [facility] + DIFF, W/ CRYSTALS     Status: Abnormal   Collection Time    05/17/13  2:47 AM      Result Value Ref Range   Color, Synovial YELLOW (*) YELLOW   Appearance-Synovial TURBID (*) CLEAR   Crystals, Fluid INTRACELLULAR CALCIUM PYROPHOSPHATE CRYSTALS     Comment: EXTRACELLULAR CALCIUM PYROPHOSPHATE CRYSTALS   WBC, Synovial 33825 (*) 0 - 200 /cu mm   Neutrophil, Synovial 93 (*) 0 - 25 %   Lymphocytes-Synovial Fld 1  0 - 20 %   Monocyte-Macrophage-Synovial Fluid 6 (*) 50 - 90 %  GLUCOSE, CAPILLARY     Status: Abnormal   Collection Time    05/17/13  7:42 AM      Result Value Ref Range   Glucose-Capillary 113 (*) 70 - 99 mg/dL    Dg Knee Complete 4 Views Left  05/17/2013   CLINICAL DATA:  Sudden onset of severe knee pain.  No known injury.  EXAM: LEFT KNEE - COMPLETE 4+ VIEW  COMPARISON:  None.  FINDINGS: Large suprapatellar knee joint effusion. No acute fracture or malalignment. Mild tricompartmental degenerative changes. Atherosclerotic calcifications noted in the superficial femoral and popliteal arteries. Surgical clips in the medial soft tissues suggest prior saphenous vein harvesting.  IMPRESSION: Large suprapatellar joint effusion. Differential considerations include spontaneous hemarthrosis, septic joint, and degenerative synovitis.  Atherosclerotic calcifications.   Electronically Signed   By: Jacqulynn Cadet M.D.   On: 05/17/2013 01:39    Review of Systems  Constitutional: Negative for fever, chills, weight loss, malaise/fatigue and diaphoresis.  HENT: Negative.  Eyes: Negative.   Respiratory: Negative.   Cardiovascular: Positive for leg swelling. Negative for chest pain, palpitations, orthopnea, claudication and PND.  Gastrointestinal: Negative.   Genitourinary: Negative for dysuria.  Musculoskeletal: Positive for joint pain.       Left knee  Skin: Negative for itching and  rash.  Neurological: Positive for weakness.  Endo/Heme/Allergies: Negative.   Psychiatric/Behavioral: Negative.    Blood pressure 112/88, pulse 85, temperature 98 F (36.7 C), temperature source Oral, resp. rate 18, height 5' 1" (1.549 m), weight 69.4 kg (153 lb), SpO2 99.00%. Physical Exam  Constitutional: She appears well-developed and well-nourished.  HENT:  Head: Normocephalic and atraumatic.  Mouth/Throat: Oropharynx is clear and moist.  Eyes: EOM are normal. Pupils are equal, round, and reactive to light.  Neck: Neck supple.  Cardiovascular: Normal rate.   Respiratory: Effort normal.  GI: Soft.  Genitourinary:  Not pertinent to current symptomatology therefore not examined.  Musculoskeletal:  Left knee very guarded.  2+ synovitis.  Very sensitive.  After injection ROM -5-90 degrees. Less pain   Walked around bed to chair with a walker  2+ dorsalis ped pulses  Neurological: She is alert.  Skin: Skin is warm.  Psychiatric: She has a normal mood and affect.    Assessment Active Problems:   Knee effusion, left Diabetes  Hypertension Aortic valve Obstructive steep apnea  Plan: Injected 2:4 Depomedrol and 0.5% Marcaine.  She tolerated the shot welll.  Able to ambulate after   Linda Hedges 05/17/2013, 2:56 PM

## 2013-05-17 NOTE — Progress Notes (Signed)
UR completed 

## 2013-05-17 NOTE — Progress Notes (Signed)
PT Cancellation Note  Patient Details Name: [name] MRN: [id number] DOB: [birth year]   Cancelled Treatment:    Reason Eval/Treat Not Completed: Other (comment) (ortho consult pending); will attempt again later today if possible   [facility] 05/17/2013, 8:37 AM

## 2013-05-17 NOTE — ED Notes (Signed)
MD at bedside. 

## 2013-05-17 NOTE — Progress Notes (Signed)
Physical Therapy Evaluation Patient Details Name: [name] MRN: [id number] DOB: May 27, 1934 Today's Date: 05/17/2013   History of Present Illness  [name] is a 79 year old female last seen in my office following a hospitalization for GI bleed due to NSAID use and ulcer.  She has chronic Orthopedic issues and last had a shoulder injection in December per Dr. Layne Benton.  Her knees have been a long term issue for her and I believe she has had Synvisc injections in the past, although I have no current Ortho notes on record.  She states her last was per Dr. Lynann Bologna in the fall and is scheduled for another on April 30th.  She comes to the ER after 2 days of not being able to bear weight on her L knee and 10/10 pain.    Clinical Impression  Eval limited this date due to pain, ortho consult still  Pending; fluid was taken off  knee in ED; still moderately edematous suprapatellar region at this time;  Will benefit from PT to address immobility and deficits below;     Follow Up Recommendations SNF    Equipment Recommendations  Rolling walker with 5" wheels    Recommendations for Other Services       Precautions / Restrictions Precautions Precautions: Fall      Mobility  Bed Mobility Overal bed mobility: Needs Assistance;+2 for physical assistance Bed Mobility: Supine to Sit;Sit to Supine     Supine to sit: Min assist Sit to supine: Min assist   General bed mobility comments: incr time and support of LLE, pt with incr pain LLE and unable to come to full sit  Transfers                    Ambulation/Gait                Stairs            Wheelchair Mobility    Modified Rankin (Stroke Patients Only)       Balance                                     Pertinent Vitals/Pain Pain 10/10 L knee     Home Living Family/patient expects to be discharged to:: Private residence Living Arrangements: Alone   Type of Home: House Home  Access: Stairs to enter       Home Equipment: None Additional Comments: unable to walk to bathroom which was about 5 feet away prior to this adm per pt-due to pain    Prior Function Level of Independence: Independent               Hand Dominance   Dominant Hand: Right    Extremity/Trunk Assessment   Upper Extremity Assessment: Overall WFL for tasks assessed           Lower Extremity Assessment: LLE deficits/detail   LLE Deficits / Details: gentle AAROM to 5- 35* knee flexion; unable to lift against gravity due to pain     Communication   Communication: No difficulties  Cognition Arousal/Alertness: Awake/alert Behavior During Therapy: WFL for tasks assessed/performed Overall Cognitive Status: Within Functional Limits for tasks assessed                      General Comments      Exercises        Assessment/Plan  PT Assessment Patient needs continued PT services  PT Diagnosis Difficulty walking;Acute pain   PT Problem List Decreased range of motion;Decreased activity tolerance;Decreased mobility;Pain  PT Treatment Interventions DME instruction;Gait training;Functional mobility training;Therapeutic activities;Therapeutic exercise;Patient/family education   PT Goals (Current goals can be found in the Care Plan section) Acute Rehab PT Goals Patient Stated Goal: decr pain PT Goal Formulation: With patient Time For Goal Achievement: 05/24/13 Potential to Achieve Goals: Good    Frequency Min 3X/week   Barriers to discharge        End of Session     Patient left: in bed;with call bell/phone within reach    Functional Assessment Tool Used: clinical judgement Functional Limitation: Changing and maintaining body position Changing and Maintaining Body Position Current Status (Z3086): At least 20 percent but less than 40 percent impaired, limited or restricted Changing and Maintaining Body Position Goal Status (V7846): At least 1 percent but  less than 20 percent impaired, limited or restricted    Time: 1122-1135 PT Time Calculation (min): 13 min   Charges:   PT Evaluation $Initial PT Evaluation Tier I: 1 Procedure PT Treatments $Therapeutic Activity: 8-22 mins   PT G Codes:   Functional Assessment Tool Used: clinical judgement Functional Limitation: Changing and maintaining body position    [facility] 05/17/2013, 12:20 PM

## 2013-05-18 LAB — GLUCOSE, CAPILLARY
GLUCOSE-CAPILLARY: 138 mg/dL — AB (ref 70–99)
Glucose-Capillary: 151 mg/dL — ABNORMAL HIGH (ref 70–99)

## 2013-05-18 MED ORDER — GLUCERNA SHAKE PO LIQD
237.0000 mL | ORAL | Status: DC
Start: 2013-05-18 — End: 2015-01-05

## 2013-05-18 NOTE — Progress Notes (Signed)
   CARE MANAGEMENT NOTE 05/18/2013  Patient:  [name], [name]   Account Number:  [id number]  Date Initiated:  05/18/2013  Documentation initiated by:  [facility]  Subjective/Objective Assessment:   pseudogout     Action/Plan:   [place]  lives at home alone   Anticipated DC Date:  05/18/2013   Anticipated DC Plan:  [place]  CM consult      [facility] Choice  HOME HEALTH   Choice offered to / List presented to:  C-1 Patient   DME arranged  [place]      DME agency  [place] arranged  HH-2 PT      [place] agency  [place]   Status of service:  Completed, signed off Medicare Important Message given?   (If response is "NO", the following Medicare IM given date fields will be blank) Date Medicare IM given:   Date Additional Medicare IM given:    Discharge Disposition:  [place]  Per UR Regulation:    If discussed at Long Length of Stay Meetings, dates discussed:    Comments:  05/18/2013 1230 NCM spoke to pt and offered choice for [facility] States she had a [place] agency and would like to use them again but could not remember what agency. Contacted [facility] and [facility] to see if they had her in the past. Pt requesting RW for home. Faxed orders to [facility]. Contacted [facility] for RW and they will deliver to room. Pt had [facility] in past.   Jonnie Finner RN CCM Case Mgmt phone [phone]

## 2013-05-18 NOTE — Progress Notes (Signed)
Physical Therapy Treatment Patient Details Name: [name] MRN: [id number] DOB: 04-Oct-1934 Today's Date: 05/18/2013    History of Present Illness      PT Comments    Marked improvement in pt pain control and activity tolerance.  Pt plans to d/c home with HHPT follow up and intermittent assist of friends/family.  Pt states she will borrow 2nd RW from friend for use on second floor  Follow Up Recommendations  Home health PT     Equipment Recommendations  Rolling walker with 5" wheels    Recommendations for Other Services OT consult     Precautions / Restrictions Precautions Precautions: Fall Restrictions Weight Bearing Restrictions: No    Mobility  Bed Mobility Overal bed mobility: Modified Independent Bed Mobility: Supine to Sit           General bed mobility comments: cues for use of UEs to self assist  Transfers Overall transfer level: Modified independent Equipment used: Rolling walker (2 wheeled)             General transfer comment: cues for use of UEs to self assist  Ambulation/Gait Ambulation/Gait assistance: Supervision;Min guard Ambulation Distance (Feet): 200 Feet Assistive device: Rolling walker (2 wheeled);Quad cane Gait Pattern/deviations: Step-through pattern;Step-to pattern;Shuffle;Trunk flexed;Antalgic Gait velocity: decr   General Gait Details: Ambulated 175' with RW, sup and min cues for position from RW.  Pt attempted ambulation with QC for use on second floor but admits to decreased stability.  Pt states she can borrow 2nd RW from friend so as to have one on each floor with stair lift between   Stairs Stairs: Yes Stairs assistance: Min guard Stair Management: No rails;One rail Right;Forwards;With walker Number of Stairs: 4 General stair comments: 1 step twice with RW and cues for sequence.  2 steps with both hands on one rail to get to chair lift  Wheelchair Mobility    Modified Rankin (Stroke Patients Only)        Balance                                    Cognition Arousal/Alertness: Awake/alert Behavior During Therapy: WFL for tasks assessed/performed Overall Cognitive Status: Within Functional Limits for tasks assessed                      Exercises      General Comments        Pertinent Vitals/Pain 3/10; premed, ice pack provided    Home Living                      Prior Function            PT Goals (current goals can now be found in the care plan section) Acute Rehab PT Goals Patient Stated Goal: decr pain PT Goal Formulation: With patient Time For Goal Achievement: 05/24/13 Potential to Achieve Goals: Good Progress towards PT goals: Progressing toward goals    Frequency  Min 3X/week    PT Plan Discharge plan needs to be updated    End of Session Equipment Utilized During Treatment: Gait belt Activity Tolerance: Patient tolerated treatment well Patient left: in chair;with call bell/phone within reach;with family/visitor present     Time: 1194-1740 PT Time Calculation (min): 21 min  Charges:  $Gait Training: 8-22 mins  G Codes:      Serafin [place] 2013-06-11, 12:50 PM

## 2013-05-18 NOTE — Discharge Summary (Signed)
DISCHARGE SUMMARY  MARSHE SHRESTHA  MR#: [id number]  DOB:[birth year]  Date of Admission: 05/16/2013 Date of Discharge: 05/18/2013  Attending Physician:Sallye Lunz W  Patient's OTL:[id number],TDHRCBU W, MD  Consults:  Orthopedics: [place] Lynann Bologna is her regular Orthopedist)  Discharge Diagnoses: Active Problems:   Knee effusion, left, pseudogout  Osteoarthritis  Impaired Glucose Tolerance  HTN  Hyperlipidemia Chronic leg pain Hypokalemia  Discharge Medications:   Medication List         ACCU-CHEK AVIVA PLUS test strip  Generic drug:  glucose blood     amLODipine 10 MG tablet  Commonly known as:  NORVASC  Take 15 mg by mouth daily.     atorvastatin 80 MG tablet  Commonly known as:  LIPITOR  Take 80 mg by mouth daily.     CALCIUM 1200 PO  Take 1 capsule by mouth daily.     feeding supplement (GLUCERNA SHAKE) Liqd  Take 237 mLs by mouth daily.     gabapentin 600 MG tablet  Commonly known as:  NEURONTIN  Take 600 mg by mouth daily.     losartan 50 MG tablet  Commonly known as:  COZAAR  Take 1 tablet by mouth daily.     potassium chloride 10 MEQ CR tablet  Commonly known as:  KLOR-CON  Take 10 mEq by mouth daily.     traMADol 50 MG tablet  Commonly known as:  ULTRAM  Take 1 tablet (50 mg total) by mouth every 12 (twelve) hours.        Hospital Procedures: Dg Knee Complete 4 Views Left  05/17/2013   CLINICAL DATA:  Sudden onset of severe knee pain.  No known injury.  EXAM: LEFT KNEE - COMPLETE 4+ VIEW  COMPARISON:  None.  FINDINGS: Large suprapatellar knee joint effusion. No acute fracture or malalignment. Mild tricompartmental degenerative changes. Atherosclerotic calcifications noted in the superficial femoral and popliteal arteries. Surgical clips in the medial soft tissues suggest prior saphenous vein harvesting.  IMPRESSION: Large suprapatellar joint effusion. Differential considerations include spontaneous hemarthrosis, septic joint, and  degenerative synovitis.  Atherosclerotic calcifications.   Electronically Signed   By: Jacqulynn Cadet M.D.   On: 05/17/2013 01:39    History of Present Illness: Patient is a 79 year old female with chronic orthopedic issues who came to the ER by EMS due to inability to walk x 2 days  Hospital Course: Mrs. Glomski was admitted for pain control and chronic management.  She had a consult per Ortho after the results of the ER obtained L knee aspiration revealed crystals consistent with pseudogout.  She underwent intraarticular steroid injection per rtho and by the end of the day, was able to ambulate around the hallways with pain, but with the aid of a walker.  We had discussed whether she was going to need SLF for short term vs returning home and she opts for the latter with some home health.  An RX for wheeled walker was given as well as social work arranging HHRN/PT and services, with completion of her SLF paperwork in case she changes her mind once she returns home.  Discharged in stable condition.  Day of Discharge Exam BP 123/65  Pulse 90  Temp(Src) 98.1 F (36.7 C) (Oral)  Resp 18  Ht 5\' 1"  (1.549 m)  Wt 69.4 kg (153 lb)  BMI 28.92 kg/m2  SpO2 95%  Physical Exam: General appearance: alert, cooperative and appears stated age  Head: Normocephalic, without obvious abnormality, atraumatic  Resp: Coarse upper sounds throughout, otherwise  clear Cardio: regular rate and rhythm, S1, S2 normal, no murmur, click, rub or gallop  GI: soft, non-tender; bowel sounds normal; no masses, no organomegaly  Extremities: L knee with mild warmth,more increased ROM than yesterday Pulses: 2+ and symmetric  Neurologic: Alert and oriented X 3, normal strength and tone. Normal symmetric reflexes.   Discharge Labs:  Recent Labs  05/17/13 0010  NA 140  K 3.9  CL 101  CO2 24  GLUCOSE 101*  BUN 19  CREATININE 0.78  CALCIUM 9.9    Recent Labs  05/17/13 0010  WBC 10.5  NEUTROABS 8.1*  HGB 13.0   HCT 40.2  MCV 88.0  PLT 252   Lab Results  Component Value Date   INR 0.9 06/24/2011    Discharge instructions:     Discharge Orders   Future Orders Complete By Expires   Ambulatory referral to [place]  As directed    Comments:     Please evaluate Arsenio Katz for admission to [facility].  Disciplines requested: Nursing and Physical Therapy  Services to provide: Help with ADL's, ambulation  Physician to follow patient's care (the person listed here will be responsible for signing ongoing orders): PCP  Requested Start of Care Date: Tomorrow  Special Instructions:  None   Ambulatory referral to Physical Therapy  As directed    Comments:     For continued home orthjopedic f/u, [place]'s office.   Questions:     Iontophoresis - 4 mg/ml of dexamethasone:  No   T.E.N.S. Unit Evaluation and Dispense as Indicated:  No   Walker rolling  As directed       Disposition: Home  Follow-up Appts: Follow-up with Dr. Osborne Casco at [facility] in 2 weeks.  Call for appointment. Followup with Dr. Lynann Bologna for next week will need to be arranged by patient as well.  Condition on Discharge: Stable  Discharge time: 30 minutes Signed: Xayvion Shirah W 05/18/2013, 7:41 AM

## 2013-05-18 NOTE — Progress Notes (Signed)
CSW spoke with pt regarding d/c plan. She happy with her progress with PT and decrease pain. Pt is looking forward to returning home. Pt will contact CSW if she has increased difficulty once home for assistance with rehab placement from home. [facility] has been updated. SNF will update [employer] and cancel SNF authorization request. RNCM is assisting with d/c planning needs.  Werner Lean LCSW [phone]

## 2013-05-20 LAB — BODY FLUID CULTURE
CULTURE: NO GROWTH
GRAM STAIN: NONE SEEN
Special Requests: NORMAL

## 2013-07-06 ENCOUNTER — Other Ambulatory Visit: Payer: Self-pay

## 2013-07-06 DIAGNOSIS — Z1231 Encounter for screening mammogram for malignant neoplasm of breast: Secondary | ICD-10-CM

## 2013-07-26 ENCOUNTER — Ambulatory Visit
Admission: RE | Admit: 2013-07-26 | Discharge: 2013-07-26 | Disposition: A | Discharge status: Home / Self Care | Payer: Medicare PPO | Source: Ambulatory Visit

## 2013-07-26 ENCOUNTER — Encounter (INDEPENDENT_AMBULATORY_CARE_PROVIDER_SITE_OTHER): Payer: Self-pay

## 2013-07-26 DIAGNOSIS — Z1231 Encounter for screening mammogram for malignant neoplasm of breast: Secondary | ICD-10-CM

## 2013-08-26 ENCOUNTER — Other Ambulatory Visit: Payer: Self-pay | Admitting: Dermatology

## 2014-02-08 IMAGING — CT CT CHEST W/O CM
2 of 6 series · 12 of 36 positions shown, 15 images · non-contrast
Comparison: None

CLINICAL DATA: Dyspnea.  Evaluate for interstitial lung disease.

CT CHEST WITHOUT CONTRAST
TECHNIQUE: Multidetector CT imaging of the chest was performed
following the standard protocol without IV contrast.

[Series 5: lung · axial · 0.74mm/px · z∈[-260,-44]mm · 9 of 55 slices shown, 12 images]
[im 6/55  mediastinal]
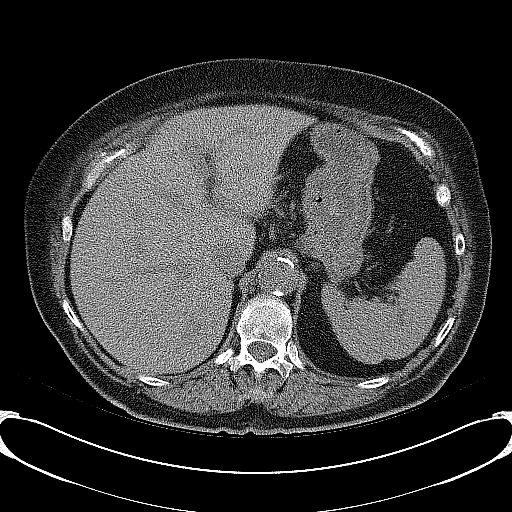
[im 6/55  lung]
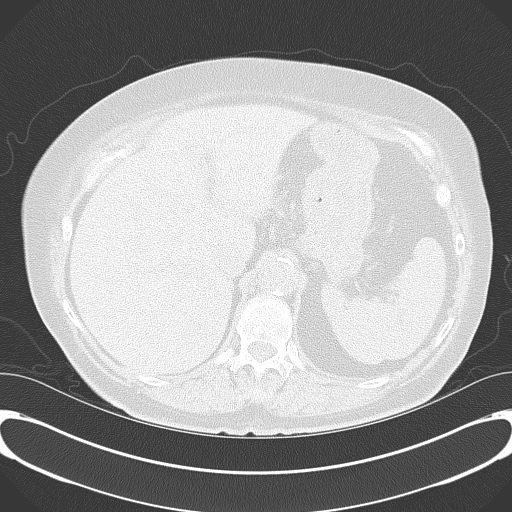
[im 11/55  lung]
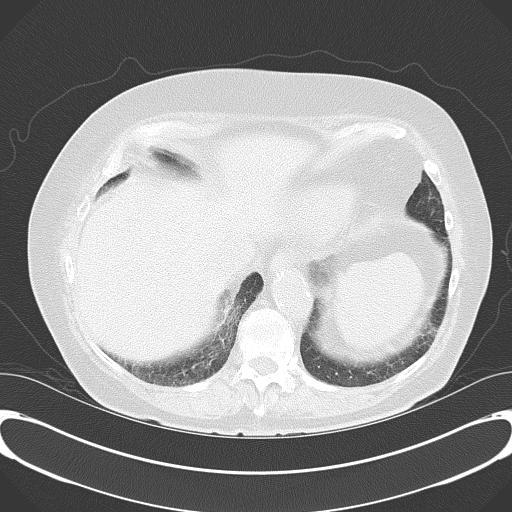
[im 17/55  lung]
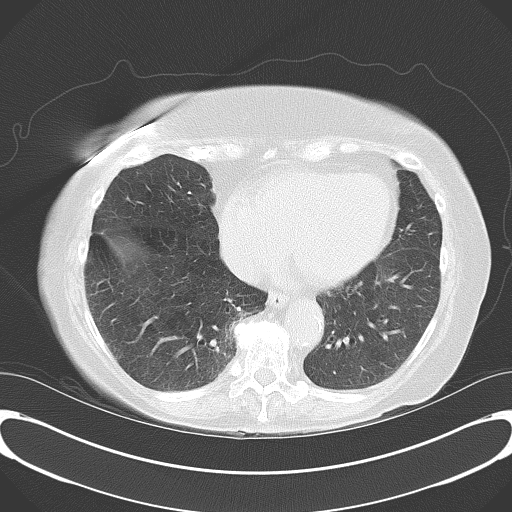
[im 22/55  lung]
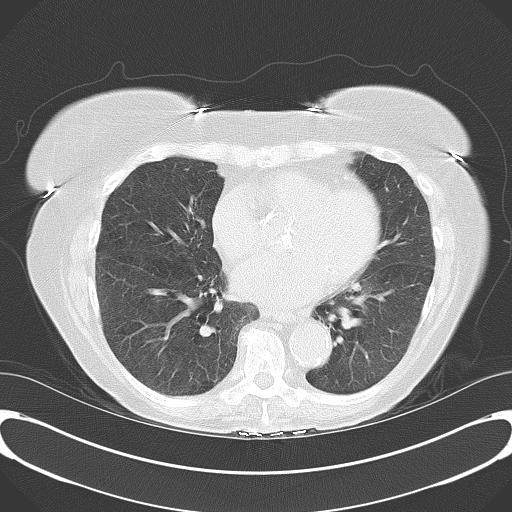
[im 28/55  mediastinal]
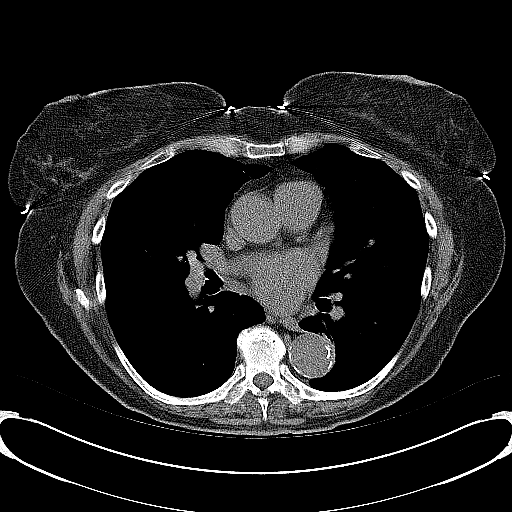
[im 28/55  lung]
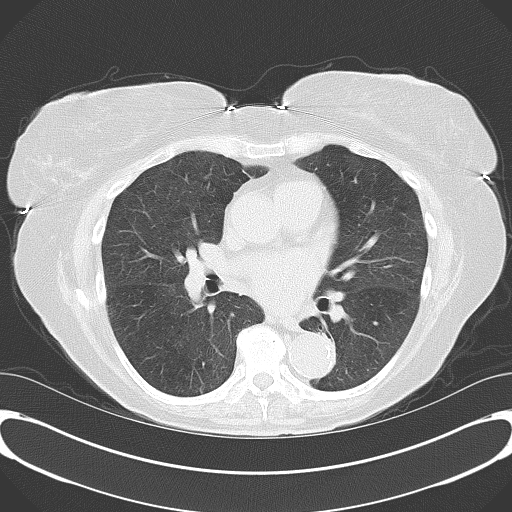
[im 33/55  lung]
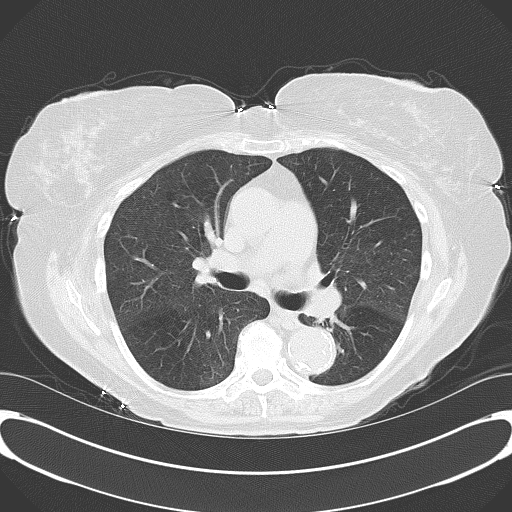
[im 38/55  lung]
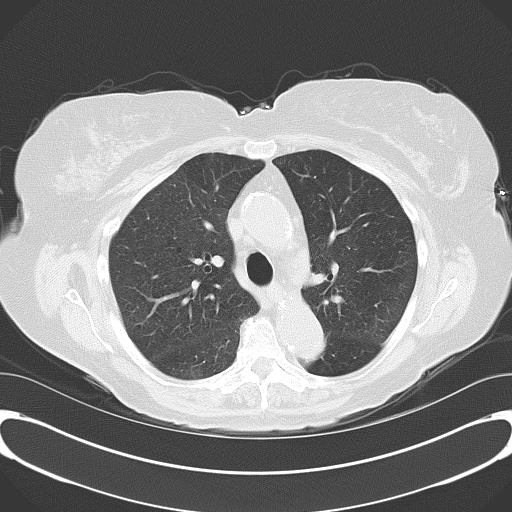
[im 44/55  lung]
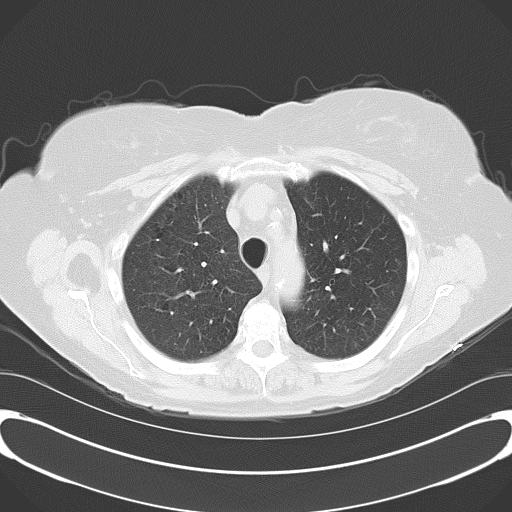
[im 49/55  mediastinal]
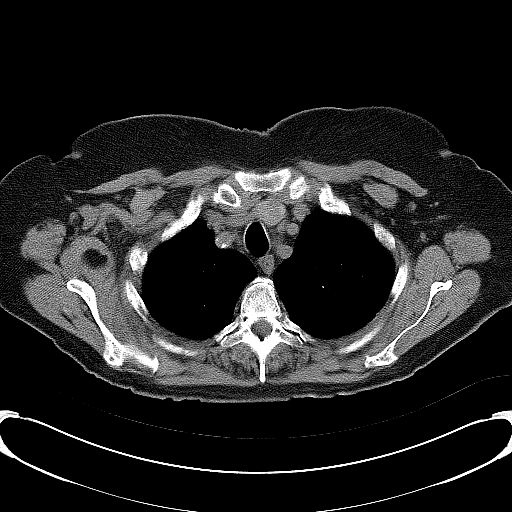
[im 49/55  lung]
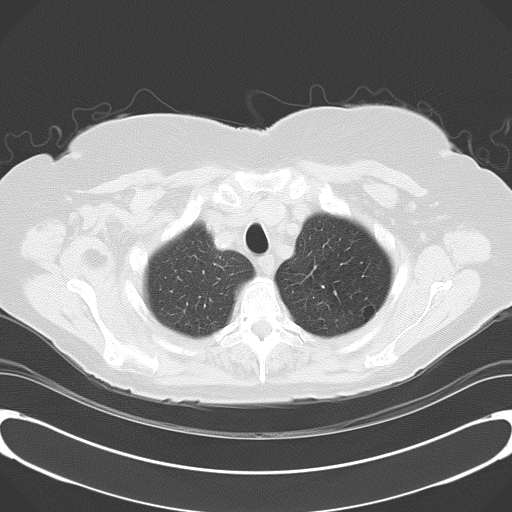

[Series 602: cor · coronal · 0.74mm/px · 3 of 100 slices shown]
[im 20/100  lung]
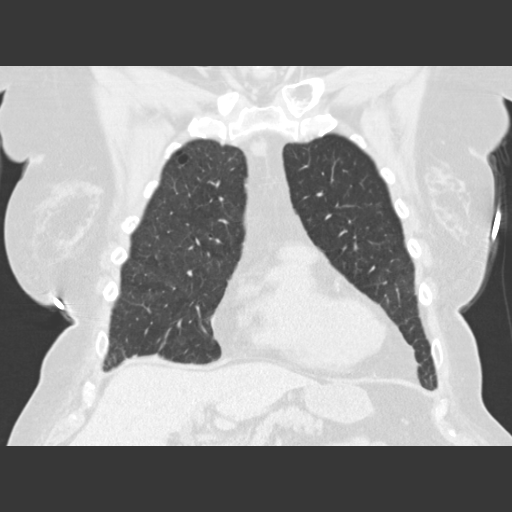
[im 40/100  lung]
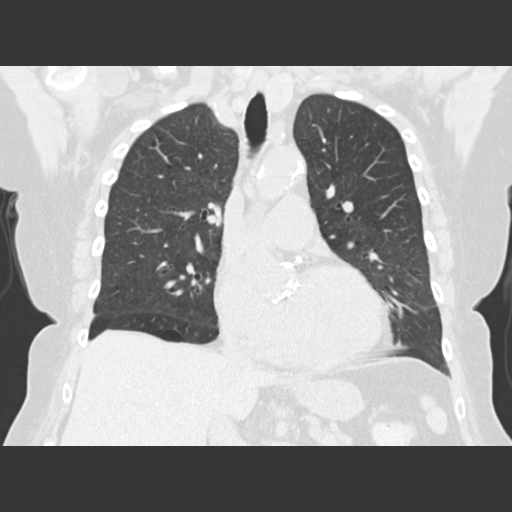
[im 60/100  lung]
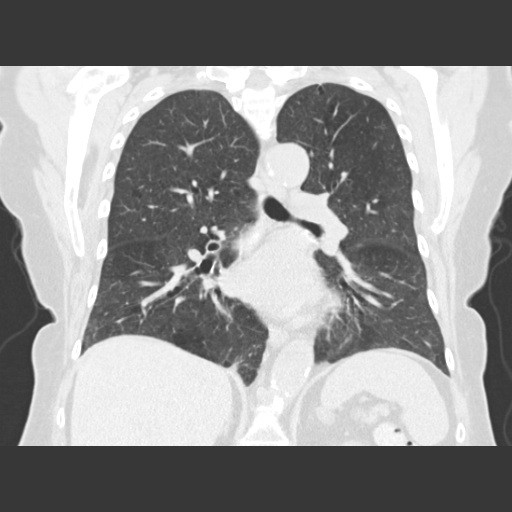

[12 of 36 positions shown; findings below may reference images not displayed]

FINDINGS: No enlarged axillary or supraclavicular lymph nodes.

Within the right lateral chest wall there is a lipoma which
measures 2.1 x 3.1 x 5.6 cm.

No mediastinal or hilar adenopathy identified.

There is no pericardial or pleural effusion identified.

There is mild emphysema.

There is no pulmonary nodularity, interstitial reticulation, or
honeycombing.

On the expiration images there is a mosaic attenuation pattern with
hyperlucent areas of air trapping.

Limited imaging through the upper abdomen shows a nodule within the
left adrenal gland that measures 1.2 cm and has negative Hounsfield
units internally.  This likely represents a benign adenoma.
IMPRESSION: 1.  Emphysema.
2.  Suspect small airways disease.

## 2014-02-13 ENCOUNTER — Encounter (HOSPITAL_COMMUNITY): Payer: Self-pay | Admitting: Emergency Medicine

## 2014-02-13 ENCOUNTER — Emergency Department (HOSPITAL_COMMUNITY)
Admission: EM | Admit: 2014-02-13 | Discharge: 2014-02-14 | Disposition: A | Discharge status: Home / Self Care | Payer: Medicare PPO | Attending: Emergency Medicine | Admitting: Emergency Medicine

## 2014-02-13 DIAGNOSIS — Z79899 Other long term (current) drug therapy: Secondary | ICD-10-CM | POA: Diagnosis not present

## 2014-02-13 DIAGNOSIS — Z8669 Personal history of other diseases of the nervous system and sense organs: Secondary | ICD-10-CM | POA: Insufficient documentation

## 2014-02-13 DIAGNOSIS — J449 Chronic obstructive pulmonary disease, unspecified: Secondary | ICD-10-CM | POA: Diagnosis not present

## 2014-02-13 DIAGNOSIS — E785 Hyperlipidemia, unspecified: Secondary | ICD-10-CM | POA: Diagnosis not present

## 2014-02-13 DIAGNOSIS — M13862 Other specified arthritis, left knee: Secondary | ICD-10-CM | POA: Diagnosis not present

## 2014-02-13 DIAGNOSIS — E119 Type 2 diabetes mellitus without complications: Secondary | ICD-10-CM | POA: Insufficient documentation

## 2014-02-13 DIAGNOSIS — Z87891 Personal history of nicotine dependence: Secondary | ICD-10-CM | POA: Insufficient documentation

## 2014-02-13 DIAGNOSIS — M1990 Unspecified osteoarthritis, unspecified site: Secondary | ICD-10-CM

## 2014-02-13 DIAGNOSIS — M13861 Other specified arthritis, right knee: Secondary | ICD-10-CM | POA: Insufficient documentation

## 2014-02-13 DIAGNOSIS — M1120 Other chondrocalcinosis, unspecified site: Secondary | ICD-10-CM

## 2014-02-13 DIAGNOSIS — I10 Essential (primary) hypertension: Secondary | ICD-10-CM | POA: Diagnosis not present

## 2014-02-13 DIAGNOSIS — M25561 Pain in right knee: Secondary | ICD-10-CM | POA: Diagnosis present

## 2014-02-13 MED ORDER — LIDOCAINE HCL 2 % IJ SOLN
INTRAMUSCULAR | Status: AC
  Administered 2014-02-14: 20 mg
  Filled 2014-02-13: qty 20

## 2014-02-13 MED ORDER — LIDOCAINE HCL 2 % IJ SOLN
10.0000 mL | Freq: Once | INTRAMUSCULAR | Status: AC
  Administered 2014-02-14: 20 mg

## 2014-02-13 NOTE — ED Notes (Signed)
Bed: [facility] Expected date:  Expected time:  Means of arrival:  Comments: EMS 79yo Rt leg pain neuropathy;c/o pain and immobility

## 2014-02-13 NOTE — ED Provider Notes (Addendum)
CSN: [id number]     Arrival date & time 02/13/14  2227 History   First MD Initiated Contact with Patient 02/13/14 2335     Chief Complaint  Patient presents with  . Leg Pain    right     (Consider location/radiation/quality/duration/timing/severity/associated sxs/prior Treatment) Patient is a 79 y.o. female presenting with leg pain.  Leg Pain  Complains of bilateral knee pain right greater than left onset 2 days ago pain similar to pseudogout that she's had in the past pain is worse with walking, improved with rest she denies fall denies fever or if she admits to nausea no vomiting no other associated symptoms. She treated herself with Percocet, without relief. She feels that her right knee is swollen. She has been able to walk however with a cane Past Medical History  Diagnosis Date  . Diabetes mellitus   . Hyperlipidemia   . Hypertension   . Sleep apnea     AHI 89/hr  . Allergic rhinitis   . COPD (chronic obstructive pulmonary disease)     PFT 08/08/09-FEV1 1.98/107; R 0.77; small airway obst w/tresp to dilator;DLCO 44%  . Fibromuscular dysplasia     RAS  . Complication of anesthesia     Hard time waking up  . PONV (postoperative nausea and vomiting)   . Arthritis    Past Surgical History  Procedure Laterality Date  . Pituitary tumor removed      x 2  . Back surgery  2005  . Tonsillectomy    . Nasal septal deviation    . Bilateral renal bypass    . Esophagogastroduodenoscopy N/A 10/01/2012    Procedure: ESOPHAGOGASTRODUODENOSCOPY (EGD);  Surgeon: Missy Sabins, MD;  [facility] END[facility];  Service: Endoscopy;  Laterality: N/A;   Family History  Problem Relation Age of Onset  . Stroke Father   . Breast cancer Mother    History  Substance Use Topics  . Smoking status: Former Smoker -- 1.00 packs/day for 35 years    Types: Cigarettes    Quit date: 02/17/1997  . Smokeless tobacco: Former Systems developer  . Alcohol Use: No   OB History    No data available     Review of  Systems  Musculoskeletal: Positive for arthralgias and gait problem.       Bilateral knee pain. Walks with cane the past few days  All other systems reviewed and are negative.     Allergies  Valacyclovir hcl  Home Medications   Prior to Admission medications   Medication Sig Start Date End Date Taking? Authorizing Provider  ACCU-CHEK AVIVA PLUS test strip  05/17/11  Yes Historical Provider, MD  amLODipine (NORVASC) 10 MG tablet Take 15 mg by mouth daily.     Yes Historical Provider, MD  atorvastatin (LIPITOR) 80 MG tablet Take 80 mg by mouth daily.     Yes Historical Provider, MD  fluticasone (FLONASE) 50 MCG/ACT nasal spray Place 2 sprays into both nostrils at bedtime. 01/26/14  Yes Historical Provider, MD  gabapentin (NEURONTIN) 600 MG tablet Take 600 mg by mouth daily.     Yes Historical Provider, MD  losartan (COZAAR) 50 MG tablet Take 50 mg by mouth daily.  01/14/11  Yes Historical Provider, MD  Nutritional Supplements (BOOST GLUCOSE CONTROL) LIQD Take 237 mLs by mouth daily.   Yes Historical Provider, MD  pantoprazole (PROTONIX) 40 MG tablet Take 40 mg by mouth 2 (two) times daily. 01/09/14  Yes Historical Provider, MD  potassium chloride (MICRO-K) 10 MEQ  CR capsule Take 10 mEq by mouth daily. 01/09/14  Yes Historical Provider, MD  traMADol (ULTRAM) 50 MG tablet Take 1 tablet (50 mg total) by mouth every 12 (twelve) hours. Patient taking differently: Take 50 mg by mouth 2 (two) times daily as needed (for pain.).  10/03/12  Yes Sheela Stack, MD  feeding supplement, GLUCERNA SHAKE, (GLUCERNA SHAKE) LIQD Take 237 mLs by mouth daily. Patient not taking: Reported on 02/13/2014 05/18/13   [name], MD   BP 142/71 mmHg  Pulse 78  Temp(Src) 98.2 F (36.8 C) (Oral)  Resp 20  SpO2 96% Physical Exam  Constitutional: She is oriented to person, place, and time. She appears well-developed and well-nourished. No distress.  HENT:  Head: Normocephalic and atraumatic.  Eyes:  Conjunctivae are normal. Pupils are equal, round, and reactive to light.  Neck: Neck supple. No tracheal deviation present. No thyromegaly present.  Cardiovascular: Normal rate and regular rhythm.   No murmur heard. Pulmonary/Chest: Effort normal and breath sounds normal.  Abdominal: Soft. Bowel sounds are normal. She exhibits no distension. There is no tenderness.  Musculoskeletal: Normal range of motion. She exhibits tenderness. She exhibits no edema.  Right lower extremity anterior knee is swollen, not red or warm. DP pulse 2+. Tender at anterior knee. Left lower extremity minimally tender at knee. No redness or warmth No swelling no effusion. DP pulse 2+. Bilateral upper extremities without redness swelling or warmth. Neurovascularly intact  Neurological: She is alert and oriented to person, place, and time. No cranial nerve deficit. Coordination normal.  Skin: Skin is warm and dry. No rash noted.  Psychiatric: She has a normal mood and affect.  Nursing note and vitals reviewed.   ED Course  Procedures (including critical care time) Labs Review Labs Reviewed - No data to display  Imaging Review No results found.   EKG Interpretation None     Procedure note arthrocentesis performed at right knee. Timeout performed skin prepped with chlorhexidine and numbed locally at the medial edge of the patella with 2% lidocaine. 40 mL of  yellllofluid was withdrawn using a syringe with an 18-gauge needle entered from medial edge of patella. Patient tolerated procedure well   2:20 AM requesting more pain medicine after initial treatment with intravenous morphine. Additional morphine ordered  4:10 AM patient reports pain is not improved after treatment with arthrocentesis and intravenous opioids. She declines further pain medicine. Results for orders placed or performed during the hospital encounter of 02/13/14  Gram stain  Result Value Ref Range   Specimen Description SYNOVIAL    Special  Requests Normal    Gram Stain      WBC PRESENT, PREDOMINANTLY PMN NO ORGANISMS SEEN CYTOSPIN SMEAR Gram Stain Report Called to,Read Back By and Verified With: Cathe Mons [phone] @ 0220 BY J SCOTTON    Report Status 02/14/2014 FINAL   Comprehensive metabolic panel  Result Value Ref Range   Sodium 138 135 - 145 mmol/L   Potassium 3.3 (L) 3.5 - 5.1 mmol/L   Chloride 105 96 - 112 mEq/L   CO2 24 19 - 32 mmol/L   Glucose, Bld 89 70 - 99 mg/dL   BUN 13 6 - 23 mg/dL   Creatinine, Ser 0.63 0.50 - 1.10 mg/dL   Calcium 9.0 8.4 - 10.5 mg/dL   Total Protein 7.6 6.0 - 8.3 g/dL   Albumin 4.2 3.5 - 5.2 g/dL   AST 18 0 - 37 U/L   ALT 11 0 - 35 U/L  Alkaline Phosphatase 86 39 - 117 U/L   Total Bilirubin 1.4 (H) 0.3 - 1.2 mg/dL   GFR calc non Af Amer 84 (L) >90 mL/min   GFR calc Af Amer >90 >90 mL/min   Anion gap 9 5 - 15  CBC with Differential  Result Value Ref Range   WBC 7.4 4.0 - 10.5 K/uL   RBC 4.54 3.87 - 5.11 MIL/uL   Hemoglobin 12.7 12.0 - 15.0 g/dL   HCT 40.1 36.0 - 46.0 %   MCV 88.3 78.0 - 100.0 fL   MCH 28.0 26.0 - 34.0 pg   MCHC 31.7 30.0 - 36.0 g/dL   RDW 14.9 11.5 - 15.5 %   Platelets 294 150 - 400 K/uL   Neutrophils Relative % 77 43 - 77 %   Neutro Abs 5.6 1.7 - 7.7 K/uL   Lymphocytes Relative 12 12 - 46 %   Lymphs Abs 0.9 0.7 - 4.0 K/uL   Monocytes Relative 11 3 - 12 %   Monocytes Absolute 0.8 0.1 - 1.0 K/uL   Eosinophils Relative 0 0 - 5 %   Eosinophils Absolute 0.0 0.0 - 0.7 K/uL   Basophils Relative 0 0 - 1 %   Basophils Absolute 0.0 0.0 - 0.1 K/uL  Synovial cell count + diff, w/ crystals  Result Value Ref Range   Color, Synovial YELLOW YELLOW   Appearance-Synovial TURBID (A) CLEAR   Crystals, Fluid NO CRYSTALS SEEN    WBC, Synovial 43900 (H) 0 - 200 /cu mm   Neutrophil, Synovial 94 (H) 0 - 25 %   Lymphocytes-Synovial Fld 0 0 - 20 %   Monocyte-Macrophage-Synovial Fluid 6 (L) 50 - 90 %   Eosinophils-Synovial 0 0 - 1 %   No results found.  MDM  Culture  on synovial fluid pending. Favor inflammatory rather than septic arthritis. There are no crystals seen as reported to me by laboratory technician Spoke with Dr.Holwerda who will evaluate patient for inpatient stay and pain control Dr.Holwerda interviewed patient and together they decided she could go home. At 5:30 AM patient is able to ambulate with minimal assistance. She did become mildly nauseated upon standing. Zofran ordered. Dr. Ardeth Perfect quest that I write for 60 Vicodin 5/325 tablets and 30 Zofran tablets. Which I will do. Office will follow up with her later today Diagnosis #1arthritis of bilateral knees #2 hypokalemia Final diagnoses:  None        Orlie Dakin, MD 02/14/14 [place], MD 02/14/14 [phone]

## 2014-02-13 NOTE — ED Notes (Addendum)
Per ems pt from home. Pt co of bilateral leg pain from knees down, bilateral ankles edema. HX osteoporosis, diabetes. COPD. Pt denies chest pain or sob. today's issues are recurrent and pt reports dx of pseudo gout. Pt was normaly ambulatory until two days ago when pain and swelling started.

## 2014-02-14 DIAGNOSIS — M1120 Other chondrocalcinosis, unspecified site: Secondary | ICD-10-CM

## 2014-02-14 LAB — COMPREHENSIVE METABOLIC PANEL
ALK PHOS: 86 U/L (ref 39–117)
ALT: 11 U/L (ref 0–35)
ANION GAP: 9 (ref 5–15)
AST: 18 U/L (ref 0–37)
Albumin: 4.2 g/dL (ref 3.5–5.2)
BILIRUBIN TOTAL: 1.4 mg/dL — AB (ref 0.3–1.2)
BUN: 13 mg/dL (ref 6–23)
CHLORIDE: 105 meq/L (ref 96–112)
CO2: 24 mmol/L (ref 19–32)
Calcium: 9 mg/dL (ref 8.4–10.5)
Creatinine, Ser: 0.63 mg/dL (ref 0.50–1.10)
GFR, EST NON AFRICAN AMERICAN: 84 mL/min — AB (ref >90)
GLUCOSE: 89 mg/dL (ref 70–99)
POTASSIUM: 3.3 mmol/L — AB (ref 3.5–5.1)
Sodium: 138 mmol/L (ref 135–145)
Total Protein: 7.6 g/dL (ref 6.0–8.3)

## 2014-02-14 LAB — CBC WITH DIFFERENTIAL/PLATELET
BASOS ABS: 0 10*3/uL (ref 0.0–0.1)
BASOS PCT: 0 % (ref 0–1)
Eosinophils Absolute: 0 10*3/uL (ref 0.0–0.7)
Eosinophils Relative: 0 % (ref 0–5)
HCT: 40.1 % (ref 36.0–46.0)
Hemoglobin: 12.7 g/dL (ref 12.0–15.0)
LYMPHS PCT: 12 % (ref 12–46)
Lymphs Abs: 0.9 10*3/uL (ref 0.7–4.0)
MCH: 28 pg (ref 26.0–34.0)
MCHC: 31.7 g/dL (ref 30.0–36.0)
MCV: 88.3 fL (ref 78.0–100.0)
Monocytes Absolute: 0.8 10*3/uL (ref 0.1–1.0)
Monocytes Relative: 11 % (ref 3–12)
NEUTROS ABS: 5.6 10*3/uL (ref 1.7–7.7)
NEUTROS PCT: 77 % (ref 43–77)
PLATELETS: 294 10*3/uL (ref 150–400)
RBC: 4.54 MIL/uL (ref 3.87–5.11)
RDW: 14.9 % (ref 11.5–15.5)
WBC: 7.4 10*3/uL (ref 4.0–10.5)

## 2014-02-14 LAB — SYNOVIAL CELL COUNT + DIFF, W/ CRYSTALS
CRYSTALS FLUID: NONE SEEN
Eosinophils-Synovial: 0 % (ref 0–1)
LYMPHOCYTES-SYNOVIAL FLD: 0 % (ref 0–20)
Monocyte-Macrophage-Synovial Fluid: 6 % — ABNORMAL LOW (ref 50–90)
Neutrophil, Synovial: 94 % — ABNORMAL HIGH (ref 0–25)
WBC, Synovial: 43900 /mm3 — ABNORMAL HIGH (ref 0–200)

## 2014-02-14 LAB — GRAM STAIN: Special Requests: NORMAL

## 2014-02-14 LAB — PROTEIN, SYNOVIAL FLUID: PROTEIN, SYNOVIAL FLUID: 3.7 g/dL — AB (ref 1.0–3.0)

## 2014-02-14 LAB — GLUCOSE, SYNOVIAL FLUID: Glucose, Synovial Fluid: 36 mg/dL

## 2014-02-14 MED ORDER — HYDROCODONE-ACETAMINOPHEN 5-325 MG PO TABS: 1.0000 | ORAL_TABLET | Freq: Four times a day (QID) | ORAL | Status: DC | PRN

## 2014-02-14 MED ORDER — ONDANSETRON HCL 8 MG PO TABS: 8.0000 mg | ORAL_TABLET | Freq: Three times a day (TID) | ORAL | Status: DC | PRN

## 2014-02-14 MED ORDER — POTASSIUM CHLORIDE CRYS ER 20 MEQ PO TBCR
40.0000 meq | EXTENDED_RELEASE_TABLET | Freq: Once | ORAL | Status: AC
  Administered 2014-02-14: 40 meq via ORAL
  Filled 2014-02-14: qty 2

## 2014-02-14 MED ORDER — MORPHINE SULFATE 4 MG/ML IJ SOLN
4.0000 mg | Freq: Once | INTRAMUSCULAR | Status: AC
Start: 2014-02-14 — End: 2014-02-14
  Administered 2014-02-14: 4 mg via INTRAVENOUS
  Filled 2014-02-14: qty 1

## 2014-02-14 MED ORDER — ONDANSETRON 8 MG PO TBDP
8.0000 mg | ORAL_TABLET | Freq: Once | ORAL | Status: AC
  Administered 2014-02-14: 8 mg via ORAL
  Filled 2014-02-14: qty 1

## 2014-02-14 MED ORDER — MORPHINE SULFATE 4 MG/ML IJ SOLN
4.0000 mg | Freq: Once | INTRAMUSCULAR | Status: AC
  Administered 2014-02-14: 4 mg via INTRAVENOUS
  Filled 2014-02-14: qty 1

## 2014-02-14 NOTE — Discharge Instructions (Signed)
Arthritis, Nonspecific [place] medical office will call you later today for follow-up. Arthritis is pain, redness, warmth, or puffiness (inflammation) of a joint. The joint may be stiff or hurt when you move it. One or more joints may be affected. There are many types of arthritis. Your doctor may not know what type you have right away. The most common cause of arthritis is wear and tear on the joint (osteoarthritis). HOME CARE   Only take medicine as told by your doctor.  Rest the joint as much as possible.  Raise (elevate) your joint if it is puffy.  Use crutches if the painful joint is in your leg.  Drink enough fluids to keep your pee (urine) clear or pale yellow.  Follow your doctor's diet instructions.  Use cold packs for very bad joint pain for 10 to 15 minutes every hour. Ask your doctor if it is okay for you to use hot packs.  Exercise as told by your doctor.  Take a warm shower if you have stiffness in the morning.  Move your sore joints throughout the day. GET HELP RIGHT AWAY IF:   You have a fever.  You have very bad joint pain, puffiness, or redness.  You have many joints that are painful and puffy.  You are not getting better with treatment.  You have very bad back pain or leg weakness.  You cannot control when you poop (bowel movement) or pee (urinate).  You do not feel better in 24 hours or are getting worse.  You are having side effects from your medicine. MAKE SURE YOU:   Understand these instructions.  Will watch your condition.  Will get help right away if you are not doing well or get worse. Document Released: 04/30/2009 Document Revised: 08/05/2011 Document Reviewed: 04/30/2009 [facility] Patient Information 2015 [place], [place]. This information is not intended to replace advice given to you by your health care provider. Make sure you discuss any questions you have with your health care provider.

## 2014-02-14 NOTE — ED Notes (Signed)
Pt states she lives by herself and would like to go back home via [place].

## 2014-02-14 NOTE — Consult Note (Signed)
Physician ED consult     PCP:   [name], MD   Chief Complaint:  R knee pain   HPI: [name] is an 79 y.o. female. Patient w/ known hx of pseudogout who was last admitted in April of this year for steroid injection presents for recurrent severe R knee pain. Her pain was controlled in the ED w/ morphine 4mg  IV x 2. 96mL of fluid was drained from the R knee by Dr Winfred Leeds. This cell diff indicative of inflammatory arthropathy,  Gram stain was neg for bacteria .  I was called in consultation to see if admission was warranted .   Review of Systems:  POS for R knee pain   Past Medical History: Past Medical History  Diagnosis Date  . Diabetes mellitus   . Hyperlipidemia   . Hypertension   . Sleep apnea     AHI 89/hr  . Allergic rhinitis   . COPD (chronic obstructive pulmonary disease)     PFT 08/08/09-FEV1 1.98/107; R 0.77; small airway obst w/tresp to dilator;DLCO 44%  . Fibromuscular dysplasia     RAS  . Complication of anesthesia     Hard time waking up  . PONV (postoperative nausea and vomiting)   . Arthritis    Past Surgical History  Procedure Laterality Date  . Pituitary tumor removed      x 2  . Back surgery  2005  . Tonsillectomy    . Nasal septal deviation    . Bilateral renal bypass    . Esophagogastroduodenoscopy N/A 10/01/2012    Procedure: ESOPHAGOGASTRODUODENOSCOPY (EGD);  Surgeon: Missy Sabins, MD;  Location: [facility] ENDOSCOPY;  Service: Endoscopy;  Laterality: N/A;    Medications: Prior to Admission medications   Medication Sig Start Date End Date Taking? Authorizing Provider  ACCU-CHEK AVIVA PLUS test strip  05/17/11  Yes Historical Provider, MD  amLODipine (NORVASC) 10 MG tablet Take 15 mg by mouth daily.     Yes Historical Provider, MD  atorvastatin (LIPITOR) 80 MG tablet Take 80 mg by mouth daily.     Yes Historical Provider, MD  fluticasone (FLONASE) 50 MCG/ACT nasal spray Place 2 sprays into both nostrils at bedtime. 01/26/14  Yes  Historical Provider, MD  gabapentin (NEURONTIN) 600 MG tablet Take 600 mg by mouth daily.     Yes Historical Provider, MD  losartan (COZAAR) 50 MG tablet Take 50 mg by mouth daily.  01/14/11  Yes Historical Provider, MD  Nutritional Supplements (BOOST GLUCOSE CONTROL) LIQD Take 237 mLs by mouth daily.   Yes Historical Provider, MD  pantoprazole (PROTONIX) 40 MG tablet Take 40 mg by mouth 2 (two) times daily. 01/09/14  Yes Historical Provider, MD  potassium chloride (MICRO-K) 10 MEQ CR capsule Take 10 mEq by mouth daily. 01/09/14  Yes Historical Provider, MD  traMADol (ULTRAM) 50 MG tablet Take 1 tablet (50 mg total) by mouth every 12 (twelve) hours. Patient taking differently: Take 50 mg by mouth 2 (two) times daily as needed (for pain.).  10/03/12  Yes Sheela Stack, MD  feeding supplement, GLUCERNA SHAKE, (GLUCERNA SHAKE) LIQD Take 237 mLs by mouth daily. Patient not taking: Reported on 02/13/2014 05/18/13   [name], MD    Allergies:   Allergies  Allergen Reactions  . Valacyclovir Hcl Rash    Social History:  reports that she quit smoking about 17 years ago. Her smoking use included Cigarettes. She has a 35 pack-year smoking history. She has quit using smokeless tobacco.  She reports that she does not drink alcohol or use illicit drugs.  Family History: Family History  Problem Relation Age of Onset  . Stroke Father   . Breast cancer Mother     Physical Exam: Filed Vitals:   02/13/14 2232 02/14/14 0352  BP: 142/71 137/46  Pulse: 78 86  Temp: 98.2 F (36.8 C) 98.4 F (36.9 C)  TempSrc: Oral Oral  Resp: 20 18  SpO2: 96% 94%   Gen: WF in mild distress  HEENT: MMM, anicteric  Lungs: CTAB, no wr  Cardio:  RRR , no mrg  Abd: soft, nt , nd  MSK: B/L knee edema , no erythema or warmth B/L , full ROM B/L however moderate pain w/ movement of R knee  Neuro:  Intact   Skin: warm , dry     Labs on Admission:   Recent Labs  02/14/14 0057  NA 138  K 3.3*  CL  105  CO2 24  GLUCOSE 89  BUN 13  CREATININE 0.63  CALCIUM 9.0    Recent Labs  02/14/14 0057  AST 18  ALT 11  ALKPHOS 86  BILITOT 1.4*  PROT 7.6  ALBUMIN 4.2   No results for input(s): LIPASE, AMYLASE in the last 72 hours.  Recent Labs  02/14/14 0057  WBC 7.4  NEUTROABS 5.6  HGB 12.7  HCT 40.1  MCV 88.3  PLT 294   No results for input(s): CKTOTAL, CKMB, CKMBINDEX, TROPONINI in the last 72 hours. Lab Results  Component Value Date   INR 0.9 06/24/2011   No results for input(s): TSH, T4TOTAL, T3FREE, THYROIDAB in the last 72 hours.  Invalid input(s): FREET3 No results for input(s): VITAMINB12, FOLATE, FERRITIN, TIBC, IRON, RETICCTPCT in the last 72 hours.  Radiological Exams on Admission: No results found. Orders placed or performed during the hospital encounter of 09/30/12  . EKG 12-Lead  . EKG 12-Lead  . EKG    Assessment/Plan Principal Problem:   Pseudogout  - patient pain is moderately controlled and safe for discharge home w/ PO pain medications. She has visit w/ Dr Osborne Casco next week and I will see if we can move this visit sooner. Dr Osborne Casco will be able to evaluate and injection steroids therapeutically if needed. Please provide patient w/ norco 325mg  1-2tabs q4 hours prn pain #60 and zofran 4mg  tabs #30 to use prn for symptom control . Patient will need transport home arranged as she came by EMS. Patient is medically stable for discharge w/ close PCP follow up as outpatient  Shuayb Schepers 02/14/2014, 5:15 AM

## 2014-02-19 LAB — BODY FLUID CULTURE
CULTURE: NO GROWTH
Special Requests: NORMAL

## 2014-10-02 DIAGNOSIS — E876 Hypokalemia: Secondary | ICD-10-CM | POA: Diagnosis not present

## 2014-10-02 DIAGNOSIS — K269 Duodenal ulcer, unspecified as acute or chronic, without hemorrhage or perforation: Secondary | ICD-10-CM | POA: Diagnosis not present

## 2014-10-02 DIAGNOSIS — E785 Hyperlipidemia, unspecified: Secondary | ICD-10-CM | POA: Diagnosis not present

## 2014-10-02 DIAGNOSIS — E663 Overweight: Secondary | ICD-10-CM | POA: Diagnosis not present

## 2014-10-02 DIAGNOSIS — J309 Allergic rhinitis, unspecified: Secondary | ICD-10-CM | POA: Diagnosis not present

## 2014-10-02 DIAGNOSIS — G4733 Obstructive sleep apnea (adult) (pediatric): Secondary | ICD-10-CM | POA: Diagnosis not present

## 2014-10-02 DIAGNOSIS — E1142 Type 2 diabetes mellitus with diabetic polyneuropathy: Secondary | ICD-10-CM | POA: Diagnosis not present

## 2014-10-02 DIAGNOSIS — I10 Essential (primary) hypertension: Secondary | ICD-10-CM | POA: Diagnosis not present

## 2014-10-02 DIAGNOSIS — J439 Emphysema, unspecified: Secondary | ICD-10-CM | POA: Diagnosis not present

## 2014-10-06 DIAGNOSIS — I509 Heart failure, unspecified: Secondary | ICD-10-CM | POA: Diagnosis not present

## 2014-10-06 DIAGNOSIS — G4733 Obstructive sleep apnea (adult) (pediatric): Secondary | ICD-10-CM | POA: Diagnosis not present

## 2014-10-12 DIAGNOSIS — G4733 Obstructive sleep apnea (adult) (pediatric): Secondary | ICD-10-CM | POA: Diagnosis not present

## 2014-10-24 DIAGNOSIS — Z6825 Body mass index (BMI) 25.0-25.9, adult: Secondary | ICD-10-CM | POA: Diagnosis not present

## 2014-10-24 DIAGNOSIS — Z130 Encounter for screening for diseases of the blood and blood-forming organs and certain disorders involving the immune mechanism: Secondary | ICD-10-CM | POA: Diagnosis not present

## 2014-10-24 DIAGNOSIS — A630 Anogenital (venereal) warts: Secondary | ICD-10-CM | POA: Diagnosis not present

## 2014-10-24 DIAGNOSIS — Z1389 Encounter for screening for other disorder: Secondary | ICD-10-CM | POA: Diagnosis not present

## 2014-10-24 DIAGNOSIS — Z01411 Encounter for gynecological examination (general) (routine) with abnormal findings: Secondary | ICD-10-CM | POA: Diagnosis not present

## 2014-11-01 DIAGNOSIS — C44519 Basal cell carcinoma of skin of other part of trunk: Secondary | ICD-10-CM | POA: Diagnosis not present

## 2014-11-01 DIAGNOSIS — D045 Carcinoma in situ of skin of trunk: Secondary | ICD-10-CM | POA: Diagnosis not present

## 2014-11-01 DIAGNOSIS — D225 Melanocytic nevi of trunk: Secondary | ICD-10-CM | POA: Diagnosis not present

## 2014-11-01 DIAGNOSIS — D485 Neoplasm of uncertain behavior of skin: Secondary | ICD-10-CM | POA: Diagnosis not present

## 2014-11-01 DIAGNOSIS — Z85828 Personal history of other malignant neoplasm of skin: Secondary | ICD-10-CM | POA: Diagnosis not present

## 2014-11-01 DIAGNOSIS — L821 Other seborrheic keratosis: Secondary | ICD-10-CM | POA: Diagnosis not present

## 2014-11-01 DIAGNOSIS — L570 Actinic keratosis: Secondary | ICD-10-CM | POA: Diagnosis not present

## 2014-11-06 DIAGNOSIS — I509 Heart failure, unspecified: Secondary | ICD-10-CM | POA: Diagnosis not present

## 2014-11-06 DIAGNOSIS — G4733 Obstructive sleep apnea (adult) (pediatric): Secondary | ICD-10-CM | POA: Diagnosis not present

## 2014-11-09 DIAGNOSIS — G589 Mononeuropathy, unspecified: Secondary | ICD-10-CM | POA: Diagnosis not present

## 2014-11-09 DIAGNOSIS — E1149 Type 2 diabetes mellitus with other diabetic neurological complication: Secondary | ICD-10-CM | POA: Diagnosis not present

## 2014-11-09 DIAGNOSIS — Z6826 Body mass index (BMI) 26.0-26.9, adult: Secondary | ICD-10-CM | POA: Diagnosis not present

## 2014-11-13 DIAGNOSIS — M810 Age-related osteoporosis without current pathological fracture: Secondary | ICD-10-CM | POA: Diagnosis not present

## 2014-11-13 DIAGNOSIS — M47812 Spondylosis without myelopathy or radiculopathy, cervical region: Secondary | ICD-10-CM | POA: Diagnosis not present

## 2014-11-23 DIAGNOSIS — D045 Carcinoma in situ of skin of trunk: Secondary | ICD-10-CM | POA: Diagnosis not present

## 2014-11-23 DIAGNOSIS — C44519 Basal cell carcinoma of skin of other part of trunk: Secondary | ICD-10-CM | POA: Diagnosis not present

## 2014-12-06 DIAGNOSIS — G4733 Obstructive sleep apnea (adult) (pediatric): Secondary | ICD-10-CM | POA: Diagnosis not present

## 2014-12-06 DIAGNOSIS — I509 Heart failure, unspecified: Secondary | ICD-10-CM | POA: Diagnosis not present

## 2015-01-05 ENCOUNTER — Observation Stay (HOSPITAL_COMMUNITY)
Admission: EM | Admit: 2015-01-05 | Discharge: 2015-01-09 | Disposition: A | Discharge status: Skilled Nursing Facility | Payer: Medicare PPO | Attending: Internal Medicine | Admitting: Internal Medicine

## 2015-01-05 ENCOUNTER — Encounter (HOSPITAL_COMMUNITY): Payer: Self-pay | Admitting: Emergency Medicine

## 2015-01-05 DIAGNOSIS — J449 Chronic obstructive pulmonary disease, unspecified: Secondary | ICD-10-CM | POA: Diagnosis present

## 2015-01-05 DIAGNOSIS — E119 Type 2 diabetes mellitus without complications: Secondary | ICD-10-CM | POA: Diagnosis not present

## 2015-01-05 DIAGNOSIS — M11262 Other chondrocalcinosis, left knee: Secondary | ICD-10-CM | POA: Diagnosis not present

## 2015-01-05 DIAGNOSIS — M25462 Effusion, left knee: Secondary | ICD-10-CM | POA: Diagnosis not present

## 2015-01-05 DIAGNOSIS — Z79899 Other long term (current) drug therapy: Secondary | ICD-10-CM | POA: Insufficient documentation

## 2015-01-05 DIAGNOSIS — E785 Hyperlipidemia, unspecified: Secondary | ICD-10-CM | POA: Insufficient documentation

## 2015-01-05 DIAGNOSIS — M25461 Effusion, right knee: Secondary | ICD-10-CM

## 2015-01-05 DIAGNOSIS — M11261 Other chondrocalcinosis, right knee: Secondary | ICD-10-CM | POA: Diagnosis not present

## 2015-01-05 DIAGNOSIS — R339 Retention of urine, unspecified: Secondary | ICD-10-CM | POA: Diagnosis not present

## 2015-01-05 DIAGNOSIS — R52 Pain, unspecified: Secondary | ICD-10-CM

## 2015-01-05 DIAGNOSIS — G92 Toxic encephalopathy: Secondary | ICD-10-CM | POA: Insufficient documentation

## 2015-01-05 DIAGNOSIS — T402X5A Adverse effect of other opioids, initial encounter: Secondary | ICD-10-CM | POA: Diagnosis not present

## 2015-01-05 DIAGNOSIS — I10 Essential (primary) hypertension: Secondary | ICD-10-CM | POA: Diagnosis not present

## 2015-01-05 DIAGNOSIS — M25569 Pain in unspecified knee: Secondary | ICD-10-CM | POA: Diagnosis not present

## 2015-01-05 DIAGNOSIS — G4730 Sleep apnea, unspecified: Secondary | ICD-10-CM | POA: Insufficient documentation

## 2015-01-05 DIAGNOSIS — Z87891 Personal history of nicotine dependence: Secondary | ICD-10-CM | POA: Diagnosis not present

## 2015-01-05 DIAGNOSIS — D72829 Elevated white blood cell count, unspecified: Secondary | ICD-10-CM | POA: Insufficient documentation

## 2015-01-05 DIAGNOSIS — M170 Bilateral primary osteoarthritis of knee: Secondary | ICD-10-CM | POA: Insufficient documentation

## 2015-01-05 DIAGNOSIS — M11269 Other chondrocalcinosis, unspecified knee: Secondary | ICD-10-CM | POA: Diagnosis not present

## 2015-01-05 DIAGNOSIS — M2540 Effusion, unspecified joint: Secondary | ICD-10-CM

## 2015-01-05 DIAGNOSIS — R700 Elevated erythrocyte sedimentation rate: Secondary | ICD-10-CM | POA: Insufficient documentation

## 2015-01-05 DIAGNOSIS — M25469 Effusion, unspecified knee: Secondary | ICD-10-CM | POA: Insufficient documentation

## 2015-01-05 DIAGNOSIS — M11869 Other specified crystal arthropathies, unspecified knee: Secondary | ICD-10-CM

## 2015-01-05 DIAGNOSIS — M1120 Other chondrocalcinosis, unspecified site: Secondary | ICD-10-CM | POA: Diagnosis present

## 2015-01-05 DIAGNOSIS — E1149 Type 2 diabetes mellitus with other diabetic neurological complication: Secondary | ICD-10-CM

## 2015-01-05 HISTORY — DX: Other chondrocalcinosis, unspecified site: M11.20

## 2015-01-05 NOTE — ED Notes (Signed)
[facility] presents with a 80 yo female from home with bilateral knee pain.  The patient refers to her condition as pseudo-gout.  Unable to explain why her knees hurt except for the condition.  She has been in bed the past two days and neighbors called daughter and [facility] because they had not seen any activity from her house in two days and began to worry.  Daughter is in route to hospital per [facility].  Pt found in urine from head to toe with cat feces surrounding the bed.  Evidence of water in bottles in and around bed from where patient has been drinking.  Pt states she couldn't get out of bed she hurt so bad. CBG 109.

## 2015-01-05 NOTE — ED Notes (Signed)
Bed: [id number] Expected date:  Expected time:  Means of arrival:  Comments: 61F knee pain ?gout

## 2015-01-06 ENCOUNTER — Observation Stay (HOSPITAL_COMMUNITY): Payer: Medicare PPO

## 2015-01-06 DIAGNOSIS — M1189 Other specified crystal arthropathies, multiple sites: Secondary | ICD-10-CM | POA: Insufficient documentation

## 2015-01-06 DIAGNOSIS — J449 Chronic obstructive pulmonary disease, unspecified: Secondary | ICD-10-CM

## 2015-01-06 DIAGNOSIS — I10 Essential (primary) hypertension: Secondary | ICD-10-CM

## 2015-01-06 DIAGNOSIS — M25462 Effusion, left knee: Secondary | ICD-10-CM | POA: Diagnosis not present

## 2015-01-06 DIAGNOSIS — I509 Heart failure, unspecified: Secondary | ICD-10-CM | POA: Diagnosis not present

## 2015-01-06 DIAGNOSIS — E785 Hyperlipidemia, unspecified: Secondary | ICD-10-CM

## 2015-01-06 DIAGNOSIS — M179 Osteoarthritis of knee, unspecified: Secondary | ICD-10-CM | POA: Diagnosis not present

## 2015-01-06 DIAGNOSIS — M1180 Other specified crystal arthropathies, unspecified site: Secondary | ICD-10-CM

## 2015-01-06 DIAGNOSIS — G4733 Obstructive sleep apnea (adult) (pediatric): Secondary | ICD-10-CM | POA: Diagnosis not present

## 2015-01-06 DIAGNOSIS — M25469 Effusion, unspecified knee: Secondary | ICD-10-CM | POA: Insufficient documentation

## 2015-01-06 DIAGNOSIS — M11862 Other specified crystal arthropathies, left knee: Secondary | ICD-10-CM | POA: Diagnosis not present

## 2015-01-06 LAB — SYNOVIAL CELL COUNT + DIFF, W/ CRYSTALS
Eosinophils-Synovial: 0 % (ref 0–1)
LYMPHOCYTES-SYNOVIAL FLD: 1 % (ref 0–20)
Monocyte-Macrophage-Synovial Fluid: 3 % — ABNORMAL LOW (ref 50–90)
NEUTROPHIL, SYNOVIAL: 96 % — AB (ref 0–25)
WBC, SYNOVIAL: 15320 /mm3 — AB (ref 0–200)

## 2015-01-06 LAB — BLOOD GAS, ARTERIAL
Acid-base deficit: 1.5 mmol/L (ref 0.0–2.0)
BICARBONATE: 21.4 meq/L (ref 20.0–24.0)
DRAWN BY: 103701
FIO2: 0.21
O2 Saturation: 94.4 %
PCO2 ART: 32.2 mmHg — AB (ref 35.0–45.0)
PH ART: 7.438 (ref 7.350–7.450)
Patient temperature: 98.6
TCO2: 18.9 mmol/L (ref 0–100)
pO2, Arterial: 75.4 mmHg — ABNORMAL LOW (ref 80.0–100.0)

## 2015-01-06 LAB — BASIC METABOLIC PANEL
Anion gap: 10 (ref 5–15)
Anion gap: 11 (ref 5–15)
BUN: 12 mg/dL (ref 6–20)
BUN: 15 mg/dL (ref 6–20)
CHLORIDE: 106 mmol/L (ref 101–111)
CO2: 22 mmol/L (ref 22–32)
CO2: 26 mmol/L (ref 22–32)
CREATININE: 0.71 mg/dL (ref 0.44–1.00)
Calcium: 9.1 mg/dL (ref 8.9–10.3)
Calcium: 9.6 mg/dL (ref 8.9–10.3)
Chloride: 104 mmol/L (ref 101–111)
Creatinine, Ser: 0.73 mg/dL (ref 0.44–1.00)
GFR calc Af Amer: >60 mL/min (ref >60)
GFR calc non Af Amer: >60 mL/min (ref >60)
Glucose, Bld: 126 mg/dL — ABNORMAL HIGH (ref 65–99)
Glucose, Bld: 146 mg/dL — ABNORMAL HIGH (ref 65–99)
POTASSIUM: 3.6 mmol/L (ref 3.5–5.1)
POTASSIUM: 3.8 mmol/L (ref 3.5–5.1)
Sodium: 139 mmol/L (ref 135–145)
Sodium: 140 mmol/L (ref 135–145)

## 2015-01-06 LAB — CBC
HEMATOCRIT: 40.7 % (ref 36.0–46.0)
Hemoglobin: 13.3 g/dL (ref 12.0–15.0)
MCH: 28.5 pg (ref 26.0–34.0)
MCHC: 32.7 g/dL (ref 30.0–36.0)
MCV: 87.2 fL (ref 78.0–100.0)
PLATELETS: 288 10*3/uL (ref 150–400)
RBC: 4.67 MIL/uL (ref 3.87–5.11)
RDW: 15.3 % (ref 11.5–15.5)
WBC: 10.9 10*3/uL — AB (ref 4.0–10.5)

## 2015-01-06 LAB — CBC WITH DIFFERENTIAL/PLATELET
BASOS ABS: 0 10*3/uL (ref 0.0–0.1)
Basophils Relative: 0 %
EOS PCT: 0 %
Eosinophils Absolute: 0 10*3/uL (ref 0.0–0.7)
HCT: 42.5 % (ref 36.0–46.0)
Hemoglobin: 14.1 g/dL (ref 12.0–15.0)
LYMPHS ABS: 0.7 10*3/uL (ref 0.7–4.0)
Lymphocytes Relative: 6 %
MCH: 28.4 pg (ref 26.0–34.0)
MCHC: 33.2 g/dL (ref 30.0–36.0)
MCV: 85.7 fL (ref 78.0–100.0)
Monocytes Absolute: 1.6 10*3/uL — ABNORMAL HIGH (ref 0.1–1.0)
Monocytes Relative: 14 %
Neutro Abs: 8.9 10*3/uL — ABNORMAL HIGH (ref 1.7–7.7)
Neutrophils Relative %: 80 %
PLATELETS: 294 10*3/uL (ref 150–400)
RBC: 4.96 MIL/uL (ref 3.87–5.11)
RDW: 14.9 % (ref 11.5–15.5)
WBC: 11.2 10*3/uL — AB (ref 4.0–10.5)

## 2015-01-06 LAB — CK: CK TOTAL: 24 U/L — AB (ref 38–234)

## 2015-01-06 LAB — GLUCOSE, CAPILLARY
GLUCOSE-CAPILLARY: 130 mg/dL — AB (ref 65–99)
GLUCOSE-CAPILLARY: 193 mg/dL — AB (ref 65–99)
Glucose-Capillary: 142 mg/dL — ABNORMAL HIGH (ref 65–99)
Glucose-Capillary: 189 mg/dL — ABNORMAL HIGH (ref 65–99)

## 2015-01-06 LAB — SEDIMENTATION RATE: SED RATE: 64 mm/h — AB (ref 0–22)

## 2015-01-06 LAB — URIC ACID: URIC ACID, SERUM: 5.2 mg/dL (ref 2.3–6.6)

## 2015-01-06 MED ORDER — METHYLPREDNISOLONE SODIUM SUCC 125 MG IJ SOLR
INTRAMUSCULAR | Status: AC
  Filled 2015-01-06: qty 2

## 2015-01-06 MED ORDER — ONDANSETRON HCL 4 MG/2ML IJ SOLN: 4.0000 mg | Freq: Three times a day (TID) | INTRAMUSCULAR | Status: DC | PRN

## 2015-01-06 MED ORDER — ACETAMINOPHEN 650 MG RE SUPP: 650.0000 mg | Freq: Four times a day (QID) | RECTAL | Status: DC | PRN

## 2015-01-06 MED ORDER — METHYLPREDNISOLONE ACETATE 40 MG/ML IJ SUSP
40.0000 mg | Freq: Once | INTRAMUSCULAR | Status: DC
  Filled 2015-01-06: qty 1

## 2015-01-06 MED ORDER — BOOST / RESOURCE BREEZE PO LIQD
237.0000 mL | Freq: Every day | ORAL | Status: DC
  Administered 2015-01-06: 0.9875 via ORAL

## 2015-01-06 MED ORDER — HYDROMORPHONE HCL 1 MG/ML IJ SOLN
0.5000 mg | INTRAMUSCULAR | Status: DC | PRN
Start: 2015-01-06 — End: 2015-01-06

## 2015-01-06 MED ORDER — MORPHINE SULFATE (PF) 4 MG/ML IV SOLN
2.0000 mg | Freq: Once | INTRAVENOUS | Status: AC
  Administered 2015-01-06: 2 mg via INTRAVENOUS

## 2015-01-06 MED ORDER — METHYLPREDNISOLONE SODIUM SUCC 125 MG IJ SOLR
80.0000 mg | Freq: Once | INTRAMUSCULAR | Status: AC
  Administered 2015-01-06: 80 mg via INTRAVENOUS
  Filled 2015-01-06: qty 1.28

## 2015-01-06 MED ORDER — ENOXAPARIN SODIUM 40 MG/0.4ML ~~LOC~~ SOLN
40.0000 mg | SUBCUTANEOUS | Status: DC
  Filled 2015-01-06: qty 0.4

## 2015-01-06 MED ORDER — ONDANSETRON HCL 4 MG/2ML IJ SOLN
4.0000 mg | Freq: Four times a day (QID) | INTRAMUSCULAR | Status: DC | PRN
  Administered 2015-01-06: 4 mg via INTRAVENOUS
  Filled 2015-01-06: qty 2

## 2015-01-06 MED ORDER — METHYLPREDNISOLONE SODIUM SUCC 125 MG IJ SOLR
125.0000 mg | Freq: Once | INTRAMUSCULAR | Status: AC
  Administered 2015-01-06: 125 mg via INTRAVENOUS

## 2015-01-06 MED ORDER — METHYLPREDNISOLONE SODIUM SUCC 125 MG IJ SOLR: 80.0000 mg | Freq: Once | INTRAMUSCULAR | Status: DC

## 2015-01-06 MED ORDER — SODIUM CHLORIDE 0.9 % IV BOLUS (SEPSIS)
500.0000 mL | Freq: Once | INTRAVENOUS | Status: AC
  Administered 2015-01-06: 500 mL via INTRAVENOUS

## 2015-01-06 MED ORDER — PREDNISONE 50 MG PO TABS
50.0000 mg | ORAL_TABLET | Freq: Every day | ORAL | Status: DC
  Administered 2015-01-07 – 2015-01-09 (×3): 50 mg via ORAL
  Filled 2015-01-06 (×4): qty 1

## 2015-01-06 MED ORDER — AMLODIPINE BESYLATE 5 MG PO TABS
15.0000 mg | ORAL_TABLET | Freq: Every day | ORAL | Status: DC
  Administered 2015-01-06: 15 mg via ORAL
  Filled 2015-01-06: qty 1

## 2015-01-06 MED ORDER — LIDOCAINE HCL 1 % IJ SOLN
20.0000 mL | Freq: Once | INTRAMUSCULAR | Status: DC
  Filled 2015-01-06: qty 20

## 2015-01-06 MED ORDER — PANTOPRAZOLE SODIUM 40 MG PO TBEC
40.0000 mg | DELAYED_RELEASE_TABLET | Freq: Two times a day (BID) | ORAL | Status: DC
  Administered 2015-01-06 – 2015-01-09 (×6): 40 mg via ORAL
  Filled 2015-01-06 (×8): qty 1

## 2015-01-06 MED ORDER — ONDANSETRON HCL 4 MG PO TABS: 4.0000 mg | ORAL_TABLET | Freq: Four times a day (QID) | ORAL | Status: DC | PRN

## 2015-01-06 MED ORDER — MORPHINE SULFATE (PF) 2 MG/ML IV SOLN
2.0000 mg | Freq: Once | INTRAVENOUS | Status: AC
  Administered 2015-01-06: 2 mg via INTRAVENOUS
  Filled 2015-01-06: qty 1

## 2015-01-06 MED ORDER — MORPHINE SULFATE (PF) 4 MG/ML IV SOLN
INTRAVENOUS | Status: AC
  Filled 2015-01-06: qty 1

## 2015-01-06 MED ORDER — SODIUM CHLORIDE 0.9 % IV SOLN
INTRAVENOUS | Status: DC
  Administered 2015-01-06 – 2015-01-08 (×3): via INTRAVENOUS

## 2015-01-06 MED ORDER — OXYCODONE HCL 5 MG PO TABS: 5.0000 mg | ORAL_TABLET | Freq: Four times a day (QID) | ORAL | Status: DC | PRN

## 2015-01-06 MED ORDER — GABAPENTIN 300 MG PO CAPS
600.0000 mg | ORAL_CAPSULE | Freq: Every day | ORAL | Status: DC
  Administered 2015-01-06: 600 mg via ORAL
  Filled 2015-01-06: qty 2

## 2015-01-06 MED ORDER — ACETAMINOPHEN 325 MG PO TABS
650.0000 mg | ORAL_TABLET | Freq: Four times a day (QID) | ORAL | Status: DC | PRN
  Administered 2015-01-07 (×2): 650 mg via ORAL
  Filled 2015-01-06 (×2): qty 2

## 2015-01-06 MED ORDER — BUPIVACAINE HCL 0.5 % IJ SOLN
20.0000 mL | Freq: Once | INTRAMUSCULAR | Status: DC
  Filled 2015-01-06: qty 20

## 2015-01-06 MED ORDER — INSULIN ASPART 100 UNIT/ML ~~LOC~~ SOLN
0.0000 [IU] | Freq: Three times a day (TID) | SUBCUTANEOUS | Status: DC
  Administered 2015-01-06 (×2): 1 [IU] via SUBCUTANEOUS
  Administered 2015-01-06 – 2015-01-07 (×2): 2 [IU] via SUBCUTANEOUS
  Administered 2015-01-07: 1 [IU] via SUBCUTANEOUS
  Administered 2015-01-07: 2 [IU] via SUBCUTANEOUS
  Administered 2015-01-08 (×2): 1 [IU] via SUBCUTANEOUS
  Administered 2015-01-08: 2 [IU] via SUBCUTANEOUS
  Administered 2015-01-09 (×2): 1 [IU] via SUBCUTANEOUS

## 2015-01-06 MED ORDER — ALUM & MAG HYDROXIDE-SIMETH 200-200-20 MG/5ML PO SUSP: 30.0000 mL | Freq: Four times a day (QID) | ORAL | Status: DC | PRN

## 2015-01-06 MED ORDER — POTASSIUM CHLORIDE CRYS ER 10 MEQ PO TBCR
10.0000 meq | EXTENDED_RELEASE_TABLET | Freq: Every day | ORAL | Status: DC
  Administered 2015-01-06 – 2015-01-09 (×4): 10 meq via ORAL
  Filled 2015-01-06 (×4): qty 1

## 2015-01-06 MED ORDER — ATORVASTATIN CALCIUM 80 MG PO TABS
80.0000 mg | ORAL_TABLET | Freq: Every day | ORAL | Status: DC
  Administered 2015-01-06 – 2015-01-09 (×4): 80 mg via ORAL
  Filled 2015-01-06 (×4): qty 1

## 2015-01-06 MED ORDER — INSULIN ASPART 100 UNIT/ML ~~LOC~~ SOLN: 0.0000 [IU] | Freq: Every day | SUBCUTANEOUS | Status: DC

## 2015-01-06 MED ORDER — ALBUTEROL SULFATE (2.5 MG/3ML) 0.083% IN NEBU: 2.5000 mg | INHALATION_SOLUTION | Freq: Four times a day (QID) | RESPIRATORY_TRACT | Status: DC | PRN

## 2015-01-06 MED ORDER — HYDROMORPHONE HCL 1 MG/ML IJ SOLN
0.5000 mg | INTRAMUSCULAR | Status: DC | PRN
  Administered 2015-01-06: 1 mg via INTRAVENOUS
  Filled 2015-01-06: qty 1

## 2015-01-06 MED ORDER — OXYCODONE HCL 5 MG PO TABS: 5.0000 mg | ORAL_TABLET | ORAL | Status: DC | PRN

## 2015-01-06 MED ORDER — LOSARTAN POTASSIUM 50 MG PO TABS
50.0000 mg | ORAL_TABLET | Freq: Every day | ORAL | Status: DC
  Administered 2015-01-06: 50 mg via ORAL
  Filled 2015-01-06: qty 1

## 2015-01-06 NOTE — Progress Notes (Deleted)
[facility] PROGRESS NOTE  ALHELI ALVA [id number] DOB: 12-Mar-1934 DOA: 01/05/2015 PCP: [name], MD  Assessment/Plan: Bilateral Knee pain, Effusion concern with Pseudogout; Received IV solumedrol. Continue with prednisone.  Ortho consulted, evaluation for arthrocentesis.  Will discontinue IV morphine, to avoid oversedation.   Encephalopathy Lethargic, wake up and answer questions.  Will hold iv morphine.   Essential hypertension; Will hold Norvasc, Cozaar SBP in the 100 range.   Diabetes type 2: SSI.   COPD; Albuterol PRN.   Hyperlipidemia: Continue with statin.   Code Status: Full Code.  Family Communication: none at bedside.  Disposition Plan: ortho evaluation for knee effusion, might need arthrocentesis. Need PT evaluation and safety discharge plans.    Consultants:  Ortho  Procedures:  none  Antibiotics:  none  HPI/Subjective: Patient sleepy, but wake up answer questions.  She didn't allow me to examine her knees, is too painful she said.  She fall sleep between conversation.   Objective: Filed Vitals:   01/06/15 0747  BP: 119/60  Pulse: 77  Temp: 97.2 F (36.2 C)  Resp: 16    Intake/Output Summary (Last 24 hours) at 01/06/15 1459 Last data filed at 01/06/15 1444  Gross per 24 hour  Intake    240 ml  Output    120 ml  Net    120 ml   Filed Weights   01/06/15 0430  Weight: 69.4 kg (153 lb)    Exam:   General:  Sleepy, open eyes, follow command, answer questions.   Cardiovascular: S 1, S 2 RRR  Respiratory: CTA  Abdomen: BS present, soft   Musculoskeletal: Knee swelling. Patient didn't allow me to examine her knees  Data Reviewed: Basic Metabolic Panel:  Recent Labs Lab 01/06/15 0014 01/06/15 0600  NA 140 139  K 3.8 3.6  CL 104 106  CO2 26 22  GLUCOSE 126* 146*  BUN 12 15  CREATININE 0.73 0.71  CALCIUM 9.6 9.1   Liver Function Tests: No results for input(s): AST, ALT, ALKPHOS, BILITOT, PROT,  ALBUMIN in the last 168 hours. No results for input(s): LIPASE, AMYLASE in the last 168 hours. No results for input(s): AMMONIA in the last 168 hours. CBC:  Recent Labs Lab 01/06/15 0014 01/06/15 0600  WBC 11.2* 10.9*  NEUTROABS 8.9*  --   HGB 14.1 13.3  HCT 42.5 40.7  MCV 85.7 87.2  PLT 294 288   Cardiac Enzymes:  Recent Labs Lab 01/06/15 0014  CKTOTAL 24*   BNP (last 3 results) No results for input(s): BNP in the last 8760 hours.  ProBNP (last 3 results) No results for input(s): PROBNP in the last 8760 hours.  CBG:  Recent Labs Lab 01/06/15 0820 01/06/15 1236  GLUCAP 142* 189*    No results found for this or any previous visit (from the past 240 hour(s)).   Studies: No results found.  Scheduled Meds: . atorvastatin  80 mg Oral Daily  . bupivacaine  20 mL Infiltration Once  . feeding supplement  237 mL Oral Daily  . insulin aspart  0-5 Units Subcutaneous QHS  . insulin aspart  0-9 Units Subcutaneous TID WC  . lidocaine  20 mL Infiltration Once  . methylPREDNISolone acetate  40 mg Intra-articular Once  . methylPREDNISolone acetate  40 mg Intra-articular Once  . pantoprazole  40 mg Oral BID  . potassium chloride  10 mEq Oral Daily  . [START ON 01/07/2015] predniSONE  50 mg Oral Q breakfast   Continuous Infusions: . sodium  chloride 50 mL/hr at 01/06/15 [id number]    Principal Problem:   Pseudogout Active Problems:   Hyperlipidemia   Essential hypertension   DM type 2 (diabetes mellitus, type 2) (HCC)   COPD (chronic obstructive pulmonary disease) (HCC)   Knee effusion    Time spent: 25 minutes.     Niel Hummer A  [facility] Pager [phone] If 7PM-7AM, please contact night-coverage at [email or web address], password [facility] 01/06/2015, 2:59 PM

## 2015-01-06 NOTE — Consult Note (Signed)
[name], [name] NO.:  [id number]  MEDICAL RECORD NO.:  [id number]  LOCATION:  [id number]                         FACILITY:  [facility]  PHYSICIAN:  Astrid Divine. Marcelino Scot, M.D. DATE OF BIRTH:  Jul 17, 1934  DATE OF CONSULTATION:  01/06/2015 DATE OF DISCHARGE:                                CONSULTATION   REASON FOR CONSULTATION:  Bilateral knee effusions with history of CPPD.  REFERRING PHYSICIAN:  Niel Hummer, MD  HISTORY OF PRESENT ILLNESS:  Ms. [name] is a 80 year old, white female, who is a patient of Dr. Lynann Bologna, who has history of pseudogout and has occasional knee effusions.  Last incidence of this was back in December of 2015, which prompted visit to the ED where arthrocentesis was performed at that time in the emergency department.  The patient has been having several day history of increasing knee pain bilaterally. She lives alone and has had been having trouble ambulating.  She presented to the emergency department yesterday with increasing knee pain.  She has been unable to get out of bed for several days.  The patient was admitted for pain control.  No arthrocentesis was performed in the emergency department.  Orthopedics consulted for evaluation.  The patient seen and evaluated in the 1620, Dr. Marcelino Scot is at bedside for evaluation as well.  The patient reports bilateral knee pain, left greater than right.  Her right knee had bent beyond 90 degrees in bed. Left leg is resting flat on pillow.  Denies any numbness or tingling to her lower extremities.  The patient does have history of diabetes, COPD, hypertension, as noted pseudogout and arthritis.  PAST MEDICAL HISTORY:  Diabetes, hyperlipidemia, hypertension, sleep apnea, rhinitis, COPD, fibromuscular dysplasia, arthritis, and pseudogout.  PAST SURGICAL HISTORY:  Notable for removal of pituitary tumor, back surgery, tonsillectomy, repair of nasal septal deviation, bilateral renal bypass.  PAST FAMILY  HISTORY:  Notable for stroke and breast cancer.  The patient has a remote history of smoking, does not drink or do any illicit drugs. She has many cats.  ALLERGIES: valcyclovir   MEDICATIONS:  Reviewed and included in the chart.  She is on no chronic gout medications.  She is not on any chronic oral steroids.  LABORATORY DATA:  Labs were reviewed.  Notable for a mild elevation in her white blood cell count 11.2, hemoglobin 14.1, hematocrit 42.5, platelets 294.  Basic metabolic panel, really unremarkable.  Sed rate is elevated at 64.  CK is normal.  Other studies, we did order bilateral knee films prior to seeing the patient, notable for a large joint effusion on the left with osteoarthritis, right knee, very small suprapatellar joint effusion with moderate knee arthritis.  REVIEW OF SYSTEMS:  As noted above in the HPI.  In addition, to no recent illnesses.  No cough, no nausea, vomiting.  No abdominal pain. No chills.  PHYSICAL EXAM:  VITAL SIGNS:  Patient is afebrile.  Temperature 97.8, heart rate 81, BP is 107/54, respiration rate is 16 at 95% on room air. The patient is 153 pounds and 5 feet 6 inches tall. GENERAL:  Patient is awake, alert, very pleasant and engaging. EXTREMITIES:  examination of her bilateral lower extremities is notable for the  following:  Right lower extremity; no significant knee effusion.  The patient has her leg bent to about 110 degrees upon our entrance in the room and she is able to move it without too much difficulty into full extension.  No significant pain with passive motion of her knee.  Distal motor and sensory functions are grossly intact distally.  Extremities warm, palpable dorsalis pedis pulse.  There was no erythema around her knee.  No open wounds or lesions.  No significant swelling is noted.  Compartments are soft and nontender.  No pain with passive stretching.  Left lower extremity hip and ankle are unremarkable.  Significant knee  effusion is noted.  No significant erythema around the knee is noted.  Temperature is symmetric to contralateral side.  Tenderness with palpation to her knee.  The patient does allow for some gentle range of motion of her knee, full extension to 45-50 degrees of flexion without significant difficulty.  DPN, SPN, and TN sensory function intact.  EHL, FHL, anterior tibialis, posterior tibialis, peroneals, and gastrocsoleus complex motor function are intact.  Extremity is warm.  Palpable dorsalis pedis pulse is noted. Compartments are soft and nontender.  No pain with passive stretching. Knee is grossly stable with evaluation as well.  ASSESSMENT AND PLAN:  A 80 year old female with numerous medical comorbidities including type 2 diabetes and left knee effusion with history of CPPD/pseudogout. 1. Left knee effusion.  Plan for bedside arthrocentesis.  Fluid will     be sent down for Gram stain, culture, including aerobic and     anaerobic as well as cell count and crystals.  If fluid looks     noninfectious, we will also administer intraarticular steroids and     local anesthetic to see if this will help patient's symptoms.  The     patient can continue with her activities as tolerated.  Weightbear     as tolerated.  No motion restrictions, ice, and elevate as needed.     We do not feel that her right knee is septic. 2. Diabetes, hemoglobin A1c is pending.   3. Disposition: per primary service.     Please see procedure note for summary of findings      Jari Pigg, PA-C   ______________________________ Astrid Divine. Marcelino Scot, M.D.    KWP/MEDQ  D:  01/06/2015  T:  01/06/2015  Job:  [id number]  cc:   Almedia Balls, M.D. Fax: [phone]

## 2015-01-06 NOTE — ED Provider Notes (Signed)
CSN: [id number]     Arrival date & time 01/05/15  2124 History   First MD Initiated Contact with Patient 01/05/15 2304     Chief Complaint  Patient presents with  . Knee Pain     (Consider location/radiation/quality/duration/timing/severity/associated sxs/prior Treatment) The history is provided by the patient.     Pt is a 80 year-old female with hx of DM, HLD, HTN, COPD, pseudogout, PUD, she presents to the ER with complains of severe bilateral knee pain onset 2 days ago, with swelling and innability to stand or walk. She reports that the pain has limited her to laying in her bed over the past two days and she has been unable stand or ambulate, not even to feed herself or to get to the bathroom. She has subsequently urinated in her bed multiple times. She states she was able to call the ambulance however triage was given report that neighbors were concerned as he had not seen her around the house, they called the patient's daughter and the daughter called 911. She was found lying in urine from head to toe with Feces ran into bed. She poorly had water bottles to drink from but has not had any food in a day or 2. Blood sugar reported by EMS was 109.  Patient states that her knee pain is similar to other admissions and ER visits that she has had for pseudogout. She states the pain only gets better when the fluid is drained off her knee. She is unable to telemetry if she has taken antibiotics for infections in the past. Chart review shows that she has treated herself in the past with Percocet until she is then unable to walk. She does not believe that she is been seen by a orthopedic surgeon recently, and her primary care physician Dr. Osborne Castillo has been doing injections as needed. She denies having any fever, chills, nausea, vomiting, abdominal pain or chest pain while stuck and that over the last 2 days. She has no other acute complaints other than the pain in both of her knees.   Past Medical History   Diagnosis Date  . Diabetes mellitus   . Hyperlipidemia   . Hypertension   . Sleep apnea     AHI 89/hr  . Allergic rhinitis   . COPD (chronic obstructive pulmonary disease) ([place])     PFT 08/08/09-FEV1 1.98/107; R 0.77; small airway obst w/tresp to dilator;DLCO 44%  . Fibromuscular dysplasia (HCC)     RAS  . Complication of anesthesia     Hard time waking up  . PONV (postoperative nausea and vomiting)   . Arthritis   . Pseudogout    Past Surgical History  Procedure Laterality Date  . Pituitary tumor removed      x 2  . Back surgery  2005  . Tonsillectomy    . Nasal septal deviation    . Bilateral renal bypass    . Esophagogastroduodenoscopy N/A 10/01/2012    Procedure: ESOPHAGOGASTRODUODENOSCOPY (EGD);  Surgeon: Missy Sabins, MD;  Location: [facility] ENDOSCOPY;  Service: Endoscopy;  Laterality: N/A;   Family History  Problem Relation Age of Onset  . Stroke Father   . Breast cancer Mother    Social History  Substance Use Topics  . Smoking status: Former Smoker -- 1.00 packs/day for 35 years    Types: Cigarettes    Quit date: 02/17/1997  . Smokeless tobacco: Never Used  . Alcohol Use: No   OB History    No data  available     Review of Systems  Constitutional: Negative.  Negative for fever, chills, diaphoresis and fatigue.  HENT: Negative.   Respiratory: Negative.  Negative for shortness of breath.   Cardiovascular: Negative.  Negative for chest pain.  Gastrointestinal: Negative for nausea, vomiting, abdominal pain and diarrhea.  Endocrine: Negative.   Genitourinary: Negative.   Musculoskeletal: Positive for joint swelling, arthralgias and gait problem. Negative for myalgias, back pain and neck pain.  Neurological: Negative.   Psychiatric/Behavioral: Negative.       Allergies  Valacyclovir hcl  Home Medications   Prior to Admission medications   Medication Sig Start Date End Date Taking? Authorizing Provider  amLODipine (NORVASC) 10 MG tablet Take 15 mg by mouth  daily.     Yes Historical Provider, MD  atorvastatin (LIPITOR) 80 MG tablet Take 80 mg by mouth daily.     Yes Historical Provider, MD  fluticasone (FLONASE) 50 MCG/ACT nasal spray Place 2 sprays into both nostrils at bedtime. 01/26/14  Yes Historical Provider, MD  FLUZONE HIGH-DOSE 0.5 ML SUSY ADM 0.5ML IM UTD 12/21/14  Yes Historical Provider, MD  gabapentin (NEURONTIN) 600 MG tablet Take 600 mg by mouth daily.     Yes Historical Provider, MD  losartan (COZAAR) 50 MG tablet Take 50 mg by mouth daily.  01/14/11  Yes Historical Provider, MD  Nutritional Supplements (BOOST GLUCOSE CONTROL) LIQD Take 237 mLs by mouth daily.   Yes Historical Provider, MD  pantoprazole (PROTONIX) 40 MG tablet Take 40 mg by mouth 2 (two) times daily. 01/09/14  Yes Historical Provider, MD  potassium chloride (MICRO-K) 10 MEQ CR capsule Take 10 mEq by mouth daily. 01/09/14  Yes Historical Provider, MD   BP 138/61 mmHg  Pulse 94  Temp(Src) 98.2 F (36.8 C) (Oral)  Resp 20  SpO2 94% Physical Exam  Constitutional: She appears well-developed and well-nourished. No distress.  Pt appears extremely uncomfortable  HENT:  Head: Normocephalic and atraumatic.  Nose: Nose normal.  Mouth/Throat: Oropharynx is clear and moist. No oropharyngeal exudate.  Eyes: Conjunctivae and EOM are normal. Pupils are equal, round, and reactive to light. Right eye exhibits no discharge. Left eye exhibits no discharge. No scleral icterus.  Neck: Normal range of motion. Neck supple. No JVD present. No tracheal deviation present.  Cardiovascular: Normal rate, regular rhythm and normal heart sounds.  Exam reveals no gallop and no friction rub.   No murmur heard. Pulmonary/Chest: Effort normal and breath sounds normal. No stridor. No respiratory distress. She has no wheezes. She has no rales.  Abdominal: Soft. Normal appearance and bowel sounds are normal. She exhibits no distension. There is no tenderness. There is no rebound.  Musculoskeletal:  She exhibits edema and tenderness.  Right knee, mild effusion, diffusely ttp , unable to do range of motion testing secondary to pain. Left knee, mod effusion, diffusely ttp, unable to perform ROM testing secondary to pain.  No erythema, no warmth  Lymphadenopathy:    She has no cervical adenopathy.  Neurological: She is alert. No sensory deficit. She exhibits normal muscle tone. Gait normal.  Skin: Skin is warm and dry. No rash noted. She is not diaphoretic. No erythema. No pallor.  No skin break down on buttock or sacrum  Psychiatric: She has a normal mood and affect. Her behavior is normal. Judgment and thought content normal.    ED Course  Procedures (including critical care time) Labs Review Labs Reviewed  CBC WITH DIFFERENTIAL/PLATELET - Abnormal; Notable for the following:  WBC 11.2 (*)    Neutro Abs 8.9 (*)    Monocytes Absolute 1.6 (*)    All other components within normal limits  BASIC METABOLIC PANEL - Abnormal; Notable for the following:    Glucose, Bld 126 (*)    All other components within normal limits  SEDIMENTATION RATE - Abnormal; Notable for the following:    Sed Rate 64 (*)    All other components within normal limits  CK - Abnormal; Notable for the following:    Total CK 24 (*)    All other components within normal limits    Imaging Review No results found. I have personally reviewed and evaluated these images and lab results as part of my medical decision-making.   EKG Interpretation None      MDM   Final diagnoses:  None    Pt with bilateral knee pain, mild right effusion, moderate left effusion, known pseudogout, frequent presentations for same, however, this is worse with bilateral knee involved.   Over the past two days the pain has been so severe that she has laid in her bed covered in her own urine and surrounded by cat feces.  In the ER pt only complains of knee pain, denies fever, chills, N, V, or any other constitutional sx.  She has  not tolerated any manipulation of her legs, with multiple attempts by myself and by Dr. Leonides Schanz.  Basic labs obtained, which are positive for leukocytosis of 11.2, and elevated sed rate.  CK was ordered due to prolong immobility in bed, negative.  She did not have any concerning skin breakdown.  Pt most likely has pseudogout flare (inflammatory arthritis), however cannot exclude septic arthritis at this time, has a history of synovitis, persistent pain and decreased ROM after arthrocentesis, that required admission and subsequent Ortho consult for injection before pt was able to ambulate.  At this time she is unable to bend knee, and would be unable to perform arthrocentesis.  More aggressive pain control has been initiated, with addition of solumedrol.  Will call for admission.  Pt will be admitted for further treatment, pain management and workup of polyarticular pseudogout, septic arthritis at this time not excluded, due to inability to perform arthrocentesis.  [facility] to admit to [place], Dr. Alfonse Spruce, PA-C 01/06/15 Petronila, DO 01/06/15 [id number]

## 2015-01-06 NOTE — ED Notes (Signed)
Sakshi Goodbar called to check on status of mother, she states she will not be back tonight to get patient even if she is discharged and patient can not get back into her home. Pts mother confirms she is refusing to pick up mother and needs to speak with social work regarding placement. Writer advised Ms. Amburn we do not have SW on weekend, she states "I'm not sure what you want me to do about that".

## 2015-01-06 NOTE — H&P (Signed)
[facility] Admission History and Physical       [name] [id number] DOB: [birth year] DOA: 01/05/2015  Referring physician: EDP PCP: [name], MD  Specialists:   Chief Complaint: Pain and Swelling in Both Knees  HPI: [name] is a 80 y.o. female with a history of HTN, DM2, COPD, Hyperlipidemia and Pseudogout who presents to the ED with complaints of severe 10/10 pain in both knees with increased swelling.   She has not been able to walk due to her pain.    She denies any fevers or chills.  She reports that in the past her PCP would draw off the fluid on her knees.   She was administered IV solumedrol, and IV Morphine for pain and referred for admission.       Review of Systems:  Constitutional: No Weight Loss, No Weight Gain, Night Sweats, Fevers, Chills, Dizziness, Light Headedness, Fatigue, or Generalized Weakness HEENT: No Headaches, Difficulty Swallowing,Tooth/Dental Problems,Sore Throat,  No Sneezing, Rhinitis, Ear Ache, Nasal Congestion, or Post Nasal Drip,  Cardio-vascular:  No Chest pain, Orthopnea, PND, Edema in Lower Extremities, Anasarca, Dizziness, Palpitations  Resp: No Dyspnea, No DOE, No Productive Cough, No Non-Productive Cough, No Hemoptysis, No Wheezing.    GI: No Heartburn, Indigestion, Abdominal Pain, Nausea, Vomiting, Diarrhea, Constipation, Hematemesis, Hematochezia, Melena, Change in Bowel Habits,  Loss of Appetite  GU: No Dysuria, No Change in Color of Urine, No Urgency or Urinary Frequency, No Flank pain.  Musculoskeletal: +Bilateral Knee Pain and Swelling  with Decreased Range of Motion, No Back Pain.  Neurologic: No Syncope, No Seizures, Muscle Weakness, Paresthesia, Vision Disturbance or Loss, No Diplopia, No Vertigo, +Difficulty Walking,  Skin: No Rash or Lesions. Psych: No Change in Mood or Affect, No Depression or Anxiety, No Memory loss, No Confusion, or Hallucinations   Past Medical History  Diagnosis Date    . Diabetes mellitus   . Hyperlipidemia   . Hypertension   . Sleep apnea     AHI 89/hr  . Allergic rhinitis   . COPD (chronic obstructive pulmonary disease) ([place])     PFT 08/08/09-FEV1 1.98/107; R 0.77; small airway obst w/tresp to dilator;DLCO 44%  . Fibromuscular dysplasia (HCC)     RAS  . Complication of anesthesia     Hard time waking up  . PONV (postoperative nausea and vomiting)   . Arthritis   . Pseudogout      Past Surgical History  Procedure Laterality Date  . Pituitary tumor removed      x 2  . Back surgery  2005  . Tonsillectomy    . Nasal septal deviation    . Bilateral renal bypass    . Esophagogastroduodenoscopy N/A 10/01/2012    Procedure: ESOPHAGOGASTRODUODENOSCOPY (EGD);  Surgeon: Missy Sabins, MD;  Location: [facility] ENDOSCOPY;  Service: Endoscopy;  Laterality: N/A;      Prior to Admission medications   Medication Sig Start Date End Date Taking? Authorizing Provider  amLODipine (NORVASC) 10 MG tablet Take 15 mg by mouth daily.     Yes Historical Provider, MD  atorvastatin (LIPITOR) 80 MG tablet Take 80 mg by mouth daily.     Yes Historical Provider, MD  fluticasone (FLONASE) 50 MCG/ACT nasal spray Place 2 sprays into both nostrils at bedtime. 01/26/14  Yes Historical Provider, MD  FLUZONE HIGH-DOSE 0.5 ML SUSY ADM 0.5ML IM UTD 12/21/14  Yes Historical Provider, MD  gabapentin (NEURONTIN) 600 MG tablet Take 600 mg by mouth daily.  Yes Historical Provider, MD  losartan (COZAAR) 50 MG tablet Take 50 mg by mouth daily.  01/14/11  Yes Historical Provider, MD  Nutritional Supplements (BOOST GLUCOSE CONTROL) LIQD Take 237 mLs by mouth daily.   Yes Historical Provider, MD  pantoprazole (PROTONIX) 40 MG tablet Take 40 mg by mouth 2 (two) times daily. 01/09/14  Yes Historical Provider, MD  potassium chloride (MICRO-K) 10 MEQ CR capsule Take 10 mEq by mouth daily. 01/09/14  Yes Historical Provider, MD     Allergies  Allergen Reactions  . Valacyclovir Hcl Rash     Social History:  reports that she quit smoking about 17 years ago. Her smoking use included Cigarettes. She has a 35 pack-year smoking history. She has never used smokeless tobacco. She reports that she does not drink alcohol or use illicit drugs.    Family History  Problem Relation Age of Onset  . Stroke Father   . Breast cancer Mother        Physical Exam:  GEN:  Pleasant Elderly Well Nourished and Well Developed 80 y.o. Caucasian female examined and in no acute distress; cooperative with exam Filed Vitals:   01/06/15 0000 01/06/15 0113 01/06/15 0130 01/06/15 0200  BP: 132/60 101/66 136/61 138/61  Pulse: 95 96 92 94  Temp:      TempSrc:      Resp:  18 13 20   SpO2: 96% 99% 95% 94%   Blood pressure 138/61, pulse 94, temperature 98.2 F (36.8 C), temperature source Oral, resp. rate 20, SpO2 94 %. PSYCH: SHe is alert and oriented x4; does not appear anxious does not appear depressed; affect is normal HEENT: Normocephalic and Atraumatic, Mucous membranes pink; PERRLA; EOM intact; Fundi:  Benign;  No scleral icterus, Nares: Patent, Oropharynx: Clear, Fair Dentition,    Neck:  FROM, No Cervical Lymphadenopathy nor Thyromegaly or Carotid Bruit; No JVD; Breasts:: Not examined CHEST WALL: No tenderness CHEST: Normal respiration, clear to auscultation bilaterally HEART: Regular rate and rhythm; no murmurs rubs or gallops BACK: No kyphosis or scoliosis; No CVA tenderness ABDOMEN: Positive Bowel Sounds, Soft Non-Tender, No Rebound or Guarding; No Masses, No Organomegaly Rectal Exam: Not done EXTREMITIES: No Cyanosis, Clubbing, 2+ Edema of Both Knees; No Ulcerations. Genitalia: not examined PULSES: 2+ and symmetric SKIN: Normal hydration no rash or ulceration CNS:  Alert and Oriented x 4, No Focal Deficits Vascular: pulses palpable throughout    Labs on Admission:  Basic Metabolic Panel:  Recent Labs Lab 01/06/15 0014  NA 140  K 3.8  CL 104  CO2 26  GLUCOSE 126*  BUN 12   CREATININE 0.73  CALCIUM 9.6   Liver Function Tests: No results for input(s): AST, ALT, ALKPHOS, BILITOT, PROT, ALBUMIN in the last 168 hours. No results for input(s): LIPASE, AMYLASE in the last 168 hours. No results for input(s): AMMONIA in the last 168 hours. CBC:  Recent Labs Lab 01/06/15 0014  WBC 11.2*  NEUTROABS 8.9*  HGB 14.1  HCT 42.5  MCV 85.7  PLT 294   Cardiac Enzymes:  Recent Labs Lab 01/06/15 0014  CKTOTAL 24*    BNP (last 3 results) No results for input(s): BNP in the last 8760 hours.  ProBNP (last 3 results) No results for input(s): PROBNP in the last 8760 hours.  CBG: No results for input(s): GLUCAP in the last 168 hours.  Radiological Exams on Admission: No results found.   EKG: Independently reviewed.     Assessment/Plan:      80 y.o. female  with  Principal Problem:   1.      Pseudogout   IV Steroids   Pain control   Consult Ortho in AM for possible Arthrocentesis    Active Problems:   2.     Essential hypertension   On Amlodipine and Losartan Rx   Monitor BPs     3.     DM type 2 (diabetes mellitus, type 2) (HCC)   Diet Controlled   SSi Coverage PRN   Check Hb A1C     4.     COPD (chronic obstructive pulmonary disease) (HCC)   stable     5.     Hyperlipidemia   Continue Atorvastatin Rx     6.     DVT Prophylaxis   SCDs    Code Status:     FULL CODE        Family Communication:  No Family Present    Disposition Plan:  Observation Status        Time spent:  22 Minutes      [name] [facility] Pager [phone]   If [place] Please Contact the Day Rounding Team MD for [facility]  If 7PM-7AM, Please Contact Night-Floor Coverage  [email or web address] Password [email or web address] 01/06/2015, 3:42 AM     ADDENDUM:   Patient was seen and examined on 01/06/2015

## 2015-01-06 NOTE — Procedures (Signed)
Clinician: Jari Pigg, PA-C  Dr. Marcelino Scot at bedside   Specimen: 65 cc straw colored turbid synovial fluid  Complications: none  Intra-articular medications: 2 cc 1% lidocaine, 2 cc 0.5% marcaine, 1 cc 40 mg depo-medrol, infiltrated skin with 3cc 1% lidocaine  Description  Utilizing sterile technique the superolateral aspect of the Left knee was prepped with alcohol swab, followed by betadine swabs x 3.  The SQ tissue was then infiltrated with 3 cc of 1% lidocaine (plain).  After adequate anesthesia of the skin was achieved, an 18 G needle on a 30 cc syringe was then advanced into the knee joint. A palpable pop was appreciated upon entrance to the knee.  Total of 65 cc of straw colored, turbid was aspirated from the joint.  Based on the clinical appearance of the fluid and exam, a septic joint is a very low probability, we did elect to inject intra-articular steroid and local into the knee joint.  Needle removed without difficulty, dressing applied. Pt tolerated procedure well.     Specimen will be sent to lab for gram stain, culture (aerobic and anaerobic), fluid cell count with differential and crystal analysis.  Pt can participate with therapies as she can tolerate. WBAT, no ROM restrictions.  Will follow up on labs to further guide long term treatment.    Minimal effusion noted to Right knee. Clinically we did not feel this needed an arthrocentesis, pt was moving knee around without difficulty.   Consult dictation [id number]  Jari Pigg, PA-C [facility] [phone] (P) 01/06/2015 5:22 PM

## 2015-01-07 DIAGNOSIS — I10 Essential (primary) hypertension: Secondary | ICD-10-CM | POA: Diagnosis not present

## 2015-01-07 DIAGNOSIS — E785 Hyperlipidemia, unspecified: Secondary | ICD-10-CM | POA: Diagnosis not present

## 2015-01-07 DIAGNOSIS — M1180 Other specified crystal arthropathies, unspecified site: Secondary | ICD-10-CM | POA: Diagnosis not present

## 2015-01-07 LAB — URINALYSIS, ROUTINE W REFLEX MICROSCOPIC
Bilirubin Urine: NEGATIVE
GLUCOSE, UA: NEGATIVE mg/dL
Hgb urine dipstick: NEGATIVE
Ketones, ur: NEGATIVE mg/dL
LEUKOCYTES UA: NEGATIVE
Nitrite: NEGATIVE
Protein, ur: 30 mg/dL — AB
Specific Gravity, Urine: 1.016 (ref 1.005–1.030)
pH: 6 (ref 5.0–8.0)

## 2015-01-07 LAB — BASIC METABOLIC PANEL
Anion gap: 9 (ref 5–15)
BUN: 38 mg/dL — AB (ref 6–20)
CHLORIDE: 108 mmol/L (ref 101–111)
CO2: 23 mmol/L (ref 22–32)
CREATININE: 1.14 mg/dL — AB (ref 0.44–1.00)
Calcium: 8.7 mg/dL — ABNORMAL LOW (ref 8.9–10.3)
GFR calc Af Amer: 52 mL/min — ABNORMAL LOW (ref >60)
GFR calc non Af Amer: 45 mL/min — ABNORMAL LOW (ref >60)
GLUCOSE: 157 mg/dL — AB (ref 65–99)
Potassium: 3.9 mmol/L (ref 3.5–5.1)
SODIUM: 140 mmol/L (ref 135–145)

## 2015-01-07 LAB — URINE MICROSCOPIC-ADD ON
RBC / HPF: NONE SEEN RBC/hpf (ref 0–5)
SQUAMOUS EPITHELIAL / LPF: NONE SEEN

## 2015-01-07 LAB — GLUCOSE, CAPILLARY
GLUCOSE-CAPILLARY: 141 mg/dL — AB (ref 65–99)
GLUCOSE-CAPILLARY: 168 mg/dL — AB (ref 65–99)
Glucose-Capillary: 160 mg/dL — ABNORMAL HIGH (ref 65–99)
Glucose-Capillary: 169 mg/dL — ABNORMAL HIGH (ref 65–99)
Glucose-Capillary: 156 mg/dL — ABNORMAL HIGH (ref 65–99)

## 2015-01-07 LAB — C-REACTIVE PROTEIN: CRP: 36 mg/dL — AB (ref <1.0)

## 2015-01-07 MED ORDER — TAMSULOSIN HCL 0.4 MG PO CAPS
0.4000 mg | ORAL_CAPSULE | Freq: Every day | ORAL | Status: DC
  Administered 2015-01-07 – 2015-01-09 (×3): 0.4 mg via ORAL
  Filled 2015-01-07 (×3): qty 1

## 2015-01-07 MED ORDER — LORATADINE 10 MG PO TABS
10.0000 mg | ORAL_TABLET | Freq: Every day | ORAL | Status: DC | PRN
  Administered 2015-01-07: 10 mg via ORAL
  Filled 2015-01-07 (×2): qty 1

## 2015-01-07 MED ORDER — HYDROCODONE-ACETAMINOPHEN 5-325 MG PO TABS
1.0000 | ORAL_TABLET | Freq: Four times a day (QID) | ORAL | Status: DC | PRN
  Administered 2015-01-07 – 2015-01-09 (×5): 1 via ORAL
  Filled 2015-01-07 (×5): qty 1

## 2015-01-07 MED ORDER — NALOXONE HCL 0.4 MG/ML IJ SOLN
0.4000 mg | Freq: Once | INTRAMUSCULAR | Status: AC
  Administered 2015-01-07: 0.4 mg via INTRAVENOUS
  Filled 2015-01-07: qty 1

## 2015-01-07 MED ORDER — LIP MEDEX EX OINT
TOPICAL_OINTMENT | CUTANEOUS | Status: AC
  Administered 2015-01-07: 11:00:00
  Filled 2015-01-07: qty 7

## 2015-01-07 NOTE — Progress Notes (Signed)
Pt appeared to becoming more sedated than when I first came on shift. She was lethargic earlier, but she would stay awake enough to answer questions appropriately, and ask to use the bathroom. As the night progressed, become more sleepy, and was not able to keep her eyes open.  Vital signs were stable, CBG  160. Pt had not had pain meds since about 0530 Sat am.  I notified provider on call, and received order for Narcan.  Administered it to pt, after a few minutes, she started to wake up.  She was lethargic, slow to respond, but she was awake and able to answer questions.  Pt had not voided since the beginning of my night shift, and had voided little during the day.  Place pt on bed pan several times and she was unable to urinate.  Bladder scanned pt, she had 400 cc urine in bladder. Notified Provider on call again, and obtained order to I and O cath times 1.  I did so and drained approx 500 cc amber urine.  Will continue to monitor.

## 2015-01-07 NOTE — Progress Notes (Signed)
[facility] PROGRESS NOTE  [name] [id number] DOB: [birth year] DOA: 01/05/2015 PCP: [name], MD  Assessment/Plan: Bilateral Knee pain, Effusion,  Pseudogout; Received IV solumedrol. Continue with prednisone.  Ortho consulted, evaluation for arthrocentesis. Had arthrocentesis 11-19, fluids consistent with CPPD.  Follow cultures results.  Low dose Vicodin, monitor for oversedation.   Encephalopathy Lethargic, wake up and answer questions. Secondary to IV opioids.  IV morphine discontinue.  ABG with no hypercarbia.  Resolved. Will need to monitor on low dose Vicodin.   Urine retention;  Check UA.  Had I and O cath overnight. Check bladder scan, if retention plan to place foley catheter.  Start flomax.   Essential hypertension; Will hold Norvasc, and Cozaar SBP in the 100 range.   Diabetes type 2: SSI.   COPD; Albuterol PRN.   Hyperlipidemia: Continue with statin.   Code Status: Full Code.  Family Communication: none at bedside.  Disposition Plan: Need PT evaluation and safety discharge plans. Needs SNF. Discharge in 24 hour.    Consultants:  Ortho  Procedures:  arthrocentesis  Antibiotics:  none  HPI/Subjective: She is alert, answering questions. Was reading a book.  Still with significant knee pain.   Objective: Filed Vitals:   01/07/15 0726  BP: 127/57  Pulse: 80  Temp: 98.4 F (36.9 C)  Resp:     Intake/Output Summary (Last 24 hours) at 01/07/15 1046 Last data filed at 01/07/15 [id number]  Gross per 24 hour  Intake   1680 ml  Output    620 ml  Net   1060 ml   Filed Weights   01/06/15 0430  Weight: 69.4 kg (153 lb)    Exam:   General: Alert in no acute distress  Cardiovascular: S 1, S 2 RRR  Respiratory: CTA  Abdomen: BS present, soft   Musculoskeletal: Knee swelling more on the left   Data Reviewed: Basic Metabolic Panel:  Recent Labs Lab 01/06/15 0014 01/06/15 0600  NA 140 139  K 3.8 3.6  CL 104  106  CO2 26 22  GLUCOSE 126* 146*  BUN 12 15  CREATININE 0.73 0.71  CALCIUM 9.6 9.1   Liver Function Tests: No results for input(s): AST, ALT, ALKPHOS, BILITOT, PROT, ALBUMIN in the last 168 hours. No results for input(s): LIPASE, AMYLASE in the last 168 hours. No results for input(s): AMMONIA in the last 168 hours. CBC:  Recent Labs Lab 01/06/15 0014 01/06/15 0600  WBC 11.2* 10.9*  NEUTROABS 8.9*  --   HGB 14.1 13.3  HCT 42.5 40.7  MCV 85.7 87.2  PLT 294 288   Cardiac Enzymes:  Recent Labs Lab 01/06/15 0014  CKTOTAL 24*   BNP (last 3 results) No results for input(s): BNP in the last 8760 hours.  ProBNP (last 3 results) No results for input(s): PROBNP in the last 8760 hours.  CBG:  Recent Labs Lab 01/06/15 1236 01/06/15 1813 01/06/15 2040 01/07/15 0226 01/07/15 0804  GLUCAP 189* 130* 193* 160* 141*    Recent Results (from the past 240 hour(s))  Body fluid culture     Status: None (Preliminary result)   Collection Time: 01/06/15  4:50 PM  Result Value Ref Range Status   Specimen Description KNEE LEFT SYNOVIAL FLUID  Final   Special Requests NONE  Final   Gram Stain   Final    ABUNDANT WBC PRESENT,BOTH PMN AND MONONUCLEAR NO ORGANISMS SEEN Performed at [facility]    Culture PENDING  Incomplete   Report Status PENDING  Incomplete     Studies: Dg Knee Left Port  01/06/2015  CLINICAL DATA:  Severe pain in both knees with increased swelling EXAM: PORTABLE LEFT KNEE - 1-2 VIEW COMPARISON:  None. FINDINGS: There is a moderate suprapatellar joint effusion. There is no acute fracture or subluxation identified. No radio-opaque foreign body or soft tissue calcification. Moderate patellofemoral and medial compartment osteoarthritis. IMPRESSION: 1. Joint effusion. 2. Osteoarthritis. Electronically Signed   By: Kerby Moors M.D.   On: 01/06/2015 16:18   Dg Knee Right Port  01/06/2015  CLINICAL DATA:  Severe bilateral knee pain and swelling. No  reported injury. EXAM: PORTABLE RIGHT KNEE - 1-2 VIEW COMPARISON:  None. FINDINGS: There is a small suprapatellar right knee joint effusion with associated curvilinear calcifications in the suprapatellar right knee joint space, which could indicate chondrocalcinosis. No fracture, dislocation or suspicious focal osseous lesion. Mild joint space narrowing with tiny marginal osteophytes in the medial compartment. Relatively preserved lateral compartment. Small marginal patellar osteophytes. Prominent vascular calcifications in the posterior soft tissues. Small superior right patellar enthesophyte. IMPRESSION: 1. Small suprapatellar right knee joint effusion. 2. Possible chondrocalcinosis in the right knee suprapatellar joint space. 3. Mild medial and patellofemoral compartment osteoarthritis in the right knee joint. Electronically Signed   By: Ilona Sorrel M.D.   On: 01/06/2015 16:16    Scheduled Meds: . lip balm      . atorvastatin  80 mg Oral Daily  . bupivacaine  20 mL Infiltration Once  . feeding supplement  237 mL Oral Daily  . insulin aspart  0-5 Units Subcutaneous QHS  . insulin aspart  0-9 Units Subcutaneous TID WC  . pantoprazole  40 mg Oral BID  . potassium chloride  10 mEq Oral Daily  . predniSONE  50 mg Oral Q breakfast   Continuous Infusions: . sodium chloride 100 mL/hr at 01/06/15 1611    Principal Problem:   Pseudogout Active Problems:   Hyperlipidemia   Essential hypertension   DM type 2 (diabetes mellitus, type 2) (HCC)   COPD (chronic obstructive pulmonary disease) (HCC)   Knee effusion    Time spent: 25 minutes.     Niel Hummer A  [facility] Pager [phone] If 7PM-7AM, please contact night-coverage at [email or web address], password [email or web address] 01/07/2015, 10:46 AM

## 2015-01-07 NOTE — Clinical Social Work Note (Signed)
Clinical Social Work Assessment  Patient Details  Name: [name] MRN: [id number] Date of Birth: November 15, 1934  Date of referral:  01/07/15               Reason for consult:  Facility Placement                Permission sought to share information with:  Facility Sport and exercise psychologist, Family Supports Permission granted to share information::  Yes, Verbal Permission Granted  Name::        Agency::     Relationship::     Contact Information:     Housing/Transportation Living arrangements for the past 2 months:  Single Family Home Source of Information:  Patient, Adult Children Patient Interpreter Needed:    Criminal Activity/Legal Involvement Pertinent to Current Situation/Hospitalization:    Significant Relationships:  Adult Children Lives with:    Do you feel safe going back to the place where you live?    Need for family participation in patient care:  Yes (Comment)  Care giving concerns:  No caregiver   Social Worker assessment / plan:  CSW met with pt and her daughter at bedside to discuss discharge plans.  CSW provided explanation of role and discussed observation status.  CSW provided pt and her daughter with a list of SNF's for review.  CSW will send pt information out to SNF's in [place] area.  CSW will set up ambulance transport for pt at discharge  Employment status:  Retired Forensic scientist:  Managed Care PT Recommendations:  Not assessed at this time Information / Referral to community resources:     Patient/Family's Response to care:  Pt stated that she had been at [facility] in past for rehab.  Pt stated that the rehab was good but the food was bad so she was not sure she wanted to go back.  Pt stated that she had a lot of knee pain and was trying to avoid a knee replacement.  Pt stated her first choice was [facility].  She was unsure about others.  Pt's daughter is available to help with picking SNF.  Her cell is [phone] and her name is [name].  Pt's  daughter stated that pt will need a ambulance for discharge to SNF  Patient/Family's Understanding of and Emotional Response to Diagnosis, Current Treatment, and Prognosis:  Pt was unwilling to work with PT today so unclear if she understands what is required for her PT needs.  Pt is agreeable to rehab but only if she has her own room and this may not be possible and per her daughter she cannot go home.   Emotional Assessment Appearance:  Appears stated age Attitude/Demeanor/Rapport:  Complaining Affect (typically observed):  Irritable Orientation:  Oriented to Self, Oriented to Place, Oriented to  Time, Oriented to Situation Alcohol / Substance use:    Psych involvement (Current and /or in the community):     Discharge Needs  Concerns to be addressed:    Readmission within the last 30 days:    Current discharge risk:    Barriers to Discharge:  No Barriers Identified   Carlean Jews, LCSW 01/07/2015, 2:43 PM

## 2015-01-07 NOTE — NC FL2 (Signed)
[place] LEVEL OF CARE SCREENING TOOL     IDENTIFICATION  Patient Name: ALACIA PASTOREK Birthdate: 09-Feb-1935 Sex: female Admission Date (Current Location): 01/05/2015  [facility] and [place] Number: Herbalist and Address:  [facility],  [place] [address], [place]      Provider Number: [id number]  Attending Physician Name and Address:  Elmarie Shiley, MD  Relative Name and Phone Number:       Current Level of Care: Hospital Recommended Level of Care: [place] Prior Approval Number:    Date Approved/Denied:   PASRR Number: [id number]  Discharge Plan: SNF    Current Diagnoses: Patient Active Problem List   Diagnosis Date Noted  . Polyarticular pseudogout 01/06/2015  . Knee effusion   . Pseudogout 02/14/2014  . Knee effusion, left 05/17/2013  . Fatigue 08/14/2011  . Edema 03/19/2011  . COPD (chronic obstructive pulmonary disease) ([place]) 02/02/2011  . AORTIC VALVE DISORDERS 02/27/2010  . DM type 2 (diabetes mellitus, type 2) ([place]) 01/29/2010  . DYSPNEA 10/29/2009  . ALLERGIC RHINITIS 12/24/2008  . Hyperlipidemia 12/19/2008  . Essential hypertension 12/19/2008  . Obstructive sleep apnea 12/19/2008    Orientation ACTIVITIES/SOCIAL BLADDER RESPIRATION    Self, Time, Situation, Place    Continent Normal  BEHAVIORAL SYMPTOMS/MOOD NEUROLOGICAL BOWEL NUTRITION STATUS      Continent Diet (heart healthy)  PHYSICIAN VISITS COMMUNICATION OF NEEDS Height & Weight Skin    Verbally 5\' 6"  (167.6 cm) 143 lbs. Normal          AMBULATORY STATUS RESPIRATION    Supervision limited Normal      Personal Care Assistance Level of Assistance  Dressing, Bathing Bathing Assistance: Limited assistance   Dressing Assistance: Limited assistance      Functional Limitations Info                SPECIAL CARE FACTORS FREQUENCY                      Additional Factors Info  Allergies, Code Status  Code Status Info: full code Allergies Info: Valacyclovir Hcl           Current Medications (01/07/2015): Current Facility-Administered Medications  Medication Dose Route Frequency Provider Last Rate Last Dose  . 0.9 %  sodium chloride infusion   Intravenous Continuous Belkys A Regalado, MD 100 mL/hr at 01/06/15 1611    . acetaminophen (TYLENOL) tablet 650 mg  650 mg Oral Q6H PRN [name], MD       Or  . acetaminophen (TYLENOL) suppository 650 mg  650 mg Rectal Q6H PRN Harvette Evonnie Dawes, MD      . albuterol (PROVENTIL) (2.5 MG/3ML) 0.083% nebulizer solution 2.5 mg  2.5 mg Nebulization Q6H PRN Belkys A Regalado, MD      . alum & mag hydroxide-simeth (MAALOX/MYLANTA) 200-200-20 MG/5ML suspension 30 mL  30 mL Oral Q6H PRN [name], MD      . atorvastatin (LIPITOR) tablet 80 mg  80 mg Oral Daily [name], MD   80 mg at 01/07/15 1003  . bupivacaine (MARCAINE) 0.5 % (with pres) injection 20 mL  20 mL Infiltration Once Ainsley Spinner, PA-C   Stopped at 01/06/15 1518  . feeding supplement (BOOST / RESOURCE BREEZE) liquid 1 Container  237 mL Oral Daily [name], MD   [phone] Container at 01/06/15 1104  . HYDROcodone-acetaminophen (NORCO/VICODIN) 5-325 MG per tablet 1 tablet  1 tablet Oral  Q6H PRN Elmarie Shiley, MD   1 tablet at 01/07/15 1427  . insulin aspart (novoLOG) injection 0-5 Units  0-5 Units Subcutaneous QHS [name], MD   0 Units at 01/06/15 2200  . insulin aspart (novoLOG) injection 0-9 Units  0-9 Units Subcutaneous TID WC [name], MD   2 Units at 01/07/15 1304  . lip balm (CARMEX) ointment           . ondansetron (ZOFRAN) tablet 4 mg  4 mg Oral Q6H PRN [name], MD       Or  . ondansetron (ZOFRAN) injection 4 mg  4 mg Intravenous Q6H PRN [name], MD   4 mg at 01/06/15 0807  . pantoprazole (PROTONIX) EC tablet 40 mg  40 mg Oral BID [name], MD   40 mg at 01/07/15 1003  . potassium chloride  (K-DUR,KLOR-CON) CR tablet 10 mEq  10 mEq Oral Daily [name], MD   10 mEq at 01/07/15 1003  . predniSONE (DELTASONE) tablet 50 mg  50 mg Oral Q breakfast Belkys A Regalado, MD   50 mg at 01/07/15 0830  . tamsulosin (FLOMAX) capsule 0.4 mg  0.4 mg Oral Daily Belkys A Regalado, MD   0.4 mg at 01/07/15 1427   Do not use this list as official medication orders. Please verify with discharge summary.  Discharge Medications:   Medication List    ASK your doctor about these medications        amLODipine 10 MG tablet  Commonly known as:  NORVASC  Take 15 mg by mouth daily.     atorvastatin 80 MG tablet  Commonly known as:  LIPITOR  Take 80 mg by mouth daily.     BOOST GLUCOSE CONTROL Liqd  Take 237 mLs by mouth daily.     fluticasone 50 MCG/ACT nasal spray  Commonly known as:  FLONASE  Place 2 sprays into both nostrils at bedtime.     FLUZONE HIGH-DOSE 0.5 ML Susy  Generic drug:  Influenza Vac Split High-Dose  ADM 0.5ML IM UTD     gabapentin 600 MG tablet  Commonly known as:  NEURONTIN  Take 600 mg by mouth daily.     losartan 50 MG tablet  Commonly known as:  COZAAR  Take 50 mg by mouth daily.     pantoprazole 40 MG tablet  Commonly known as:  PROTONIX  Take 40 mg by mouth 2 (two) times daily.     potassium chloride 10 MEQ CR capsule  Commonly known as:  MICRO-K  Take 10 mEq by mouth daily.        Relevant Imaging Results:  Relevant Lab Results:  Recent Labs    Additional Information    Carlean Jews, LCSW

## 2015-01-07 NOTE — Progress Notes (Signed)
PT Cancellation Note  Patient Details Name: BRYANA BOXER MRN: [id number] DOB: 1934-04-14   Cancelled Treatment:    Reason Eval/Treat Not Completed: Pain limiting ability to participate. Pt refused to attempt any OOB/mobiilty on today. Offered to check back later this afternoon but pt asked for PT to check back on tomorrow. Will plan to reattempt PT eval on tomorrow if pt will participate. Thanks.    Weston Anna, MPT Pager: [phone]

## 2015-01-07 NOTE — Progress Notes (Signed)
[facility] (OTS)   Update: Gram stain confirms CPPD.  Cell count demonstrated acute flair with 15,000 WBC, consistent with inflammation rather than infection.  Anticipate clinical improvement as steroid takes effect.  Objective: Temp:  [97.7 F (36.5 C)-98.7 F (37.1 C)] 98.4 F (36.9 C) (11/20 0726) Pulse Rate:  [76-83] 80 (11/20 0726) Resp:  [12-16] 12 (11/20 0117) BP: (94-127)/(48-58) 127/57 mmHg (11/20 0726) SpO2:  [94 %-98 %] 98 % (11/20 0726)   Assessment/Plan: Follow up with Dr. Lynann Bologna as symptoms warrant. PT with [facility] Will follow culture  [place] , MD [facility] [phone] (p)

## 2015-01-07 NOTE — Care Management Obs Status (Signed)
[place] NOTIFICATION   Patient Details  Name: [name] MRN: [id number] Date of Birth: February 15, 1935   Medicare Observation Status Notification Given:  Yes    Apolonio Schneiders, RN 01/07/2015, 9:02 AM

## 2015-01-07 NOTE — Progress Notes (Signed)
Occupational Therapy Evaluation Patient Details Name: [name] MRN: [id number] DOB: 1934-05-23 Today's Date: 01/07/2015    History of Present Illness [name] is a 80 y.o. female with a history of HTN, DM2, COPD, Hyperlipidemia and Pseudogout who presents to the ED with complaints of severe 10/10 pain in both knees with increased swelling. She has not been able to walk due to her pain. She denies any fevers or chills. She reports that in the past her PCP would draw off the fluid on her knees. She was administered IV solumedrol, and IV Morphine for pain and referred for admission.    Clinical Impression   Patient minimally participated in bed level evaluation. Refusing EOB/OOB due to pain. She currently presents with decreased ADL independence and safety due to the deficits listed below. Recommend skilled OT to maximize independence and to facilitate a safe discharge plan. OT will follow.    Follow Up Recommendations  SNF;Supervision/Assistance - 24 hour    Equipment Recommendations  3 in 1 bedside comode;Tub/shower bench    Recommendations for Other Services PT consult     Precautions / Restrictions Precautions Precautions: Fall Restrictions Weight Bearing Restrictions: No      Mobility Bed Mobility                  Transfers                      Balance                                            ADL Overall ADL's : Needs assistance/impaired Eating/Feeding: Set up;Bed level   Grooming: Minimal assistance;Bed level Grooming Details (indicate cue type and reason): had difficulty opening lip balm container due to hand weakness (chronic) Upper Body Bathing: Maximal assistance;Bed level   Lower Body Bathing: Maximal assistance;Total assistance;Bed level                         General ADL Comments: Patient presents in bed. Conversive. Unwilling to get EOB/OOB due to pain. Multiple complaints  about IV, not being able to void, pain. Through the course of the evaluation, it was discovered that she was on the bedpan. She could not recall how long she had been on the bedpan. Max A to half roll/bridge to remove bedpan. Patient also felt like her IV was leaking. Reported IV issues and that I removed bedpan to RN. Patient reports that it would be her preference to go home alone. Discussed that she would need to demonstrate ability to perform self-care activities independently in order to do this. Will follow up with patient to address ADLs/functional mobility next session.     Vision     Perception     Praxis      Pertinent Vitals/Pain Pain Assessment: 0-10 Pain Score: 9  Pain Location: L knee pain Pain Descriptors / Indicators: Aching;Sore Pain Intervention(s): Limited activity within patient's tolerance;Monitored during session     Hand Dominance Right   Extremity/Trunk Assessment Upper Extremity Assessment Upper Extremity Assessment: Generalized weakness   Lower Extremity Assessment Lower Extremity Assessment: Defer to PT evaluation       Communication Communication Communication: No difficulties   Cognition Arousal/Alertness: Awake/alert Behavior During Therapy: WFL for tasks assessed/performed Overall Cognitive Status: No family/caregiver present to determine baseline cognitive functioning  General Comments       Exercises       Shoulder Instructions      Home Living Family/patient expects to be discharged to:: Private residence Living Arrangements: Alone Available Help at Discharge: Other (Comment) (cleaning lady twice a month) Type of Home: House Home Access: Stairs to enter [employer] of Steps: 2 Entrance Stairs-Rails: None Home Layout: Two level;Other (Comment) (chair/stair lift ) Alternate Level Stairs-Number of Steps: flight Alternate Level Stairs-Rails:  (uses Retail buyer) Bathroom Shower/Tub:  Tub/shower unit Shower/tub characteristics: Curtain Biochemist, clinical: Handicapped height     Home Equipment: Environmental consultant - 2 wheels;Cane - quad;Other (comment) (chair/stair lift)          Prior Functioning/Environment Level of Independence: Independent        Comments: Patient reports she usually does not use AE/DME at home    OT Diagnosis: Generalized weakness;Acute pain   OT Problem List: Decreased strength;Decreased activity tolerance;Decreased safety awareness;Decreased knowledge of use of DME or AE;Pain   OT Treatment/Interventions: Self-care/ADL training;Therapeutic exercise;DME and/or AE instruction;Therapeutic activities;Patient/family education    OT Goals(Current goals can be found in the care plan section) Acute Rehab OT Goals Patient Stated Goal: to go home alone OT Goal Formulation: With patient Time For Goal Achievement: 01/21/15 Potential to Achieve Goals: Good  OT Frequency: Min 2X/week   Barriers to D/C: Decreased caregiver support  lives alone; no family available to assist       Co-evaluation              End of Session Nurse Communication: Other (comment) (IV issues; took patient off bedpan)  Activity Tolerance: Patient limited by pain Patient left: in bed;with call bell/phone within reach;with nursing/sitter in room   Time: 1016-1032 OT Time Calculation (min): 16 min Charges:  OT General Charges $OT Visit: 1 Procedure OT Evaluation $Initial OT Evaluation Tier I: 1 Procedure G-Codes: OT G-codes **NOT FOR INPATIENT CLASS** Functional Assessment Tool Used: clinical reasoning Functional Limitation: Self care Self Care Current Status [id number]): At least 60 percent but less than 80 percent impaired, limited or restricted Self Care Goal Status [id number]): At least 20 percent but less than 40 percent impaired, limited or restricted  Amazing Cowman A 01/07/2015, 11:05 AM

## 2015-01-08 DIAGNOSIS — M1180 Other specified crystal arthropathies, unspecified site: Secondary | ICD-10-CM | POA: Diagnosis not present

## 2015-01-08 LAB — CBC
HEMATOCRIT: 35.4 % — AB (ref 36.0–46.0)
Hemoglobin: 11.8 g/dL — ABNORMAL LOW (ref 12.0–15.0)
MCH: 28.8 pg (ref 26.0–34.0)
MCHC: 33.3 g/dL (ref 30.0–36.0)
MCV: 86.3 fL (ref 78.0–100.0)
PLATELETS: 303 10*3/uL (ref 150–400)
RBC: 4.1 MIL/uL (ref 3.87–5.11)
RDW: 15.3 % (ref 11.5–15.5)
WBC: 10 10*3/uL (ref 4.0–10.5)

## 2015-01-08 LAB — PROTEIN, SYNOVIAL FLUID: PROTEIN, SYNOVIAL FLUID: 3.4 g/dL — AB (ref 1.0–3.0)

## 2015-01-08 LAB — BASIC METABOLIC PANEL
ANION GAP: 8 (ref 5–15)
BUN: 36 mg/dL — ABNORMAL HIGH (ref 6–20)
CALCIUM: 8.3 mg/dL — AB (ref 8.9–10.3)
CO2: 20 mmol/L — AB (ref 22–32)
Chloride: 109 mmol/L (ref 101–111)
Creatinine, Ser: 0.87 mg/dL (ref 0.44–1.00)
Glucose, Bld: 152 mg/dL — ABNORMAL HIGH (ref 65–99)
Potassium: 4.2 mmol/L (ref 3.5–5.1)
Sodium: 137 mmol/L (ref 135–145)

## 2015-01-08 LAB — HEMOGLOBIN A1C
HEMOGLOBIN A1C: 6.2 % — AB (ref 4.8–5.6)
MEAN PLASMA GLUCOSE: 131 mg/dL

## 2015-01-08 LAB — GLUCOSE, CAPILLARY
GLUCOSE-CAPILLARY: 125 mg/dL — AB (ref 65–99)
GLUCOSE-CAPILLARY: 156 mg/dL — AB (ref 65–99)
Glucose-Capillary: 131 mg/dL — ABNORMAL HIGH (ref 65–99)
Glucose-Capillary: 137 mg/dL — ABNORMAL HIGH (ref 65–99)

## 2015-01-08 LAB — GLUCOSE, SYNOVIAL FLUID: GLUCOSE, SYNOVIAL FLUID: 89 mg/dL

## 2015-01-08 MED ORDER — ALBUTEROL SULFATE (2.5 MG/3ML) 0.083% IN NEBU: 2.5000 mg | INHALATION_SOLUTION | Freq: Four times a day (QID) | RESPIRATORY_TRACT | Status: DC | PRN

## 2015-01-08 MED ORDER — ALUM & MAG HYDROXIDE-SIMETH 200-200-20 MG/5ML PO SUSP: 30.0000 mL | Freq: Four times a day (QID) | ORAL | Status: DC | PRN

## 2015-01-08 MED ORDER — PREDNISONE 10 MG PO TABS: ORAL_TABLET | ORAL | Status: DC

## 2015-01-08 MED ORDER — TAMSULOSIN HCL 0.4 MG PO CAPS: 0.4000 mg | ORAL_CAPSULE | Freq: Every day | ORAL | Status: DC

## 2015-01-08 MED ORDER — HYDROCODONE-ACETAMINOPHEN 5-325 MG PO TABS: 1.0000 | ORAL_TABLET | ORAL | Status: DC | PRN

## 2015-01-08 MED ORDER — INSULIN ASPART 100 UNIT/ML ~~LOC~~ SOLN: SUBCUTANEOUS | Status: DC

## 2015-01-08 NOTE — Discharge Summary (Signed)
Physician Discharge Summary  [name] MRN: [id number] DOB/AGE: 1934-06-14 80 y.o.  PCP: [name], MD   Admit date: 01/05/2015 Discharge date: 01/08/2015  Discharge Diagnoses:     Principal Problem:   Pseudogout Active Problems:   Hyperlipidemia   Essential hypertension   DM type 2 (diabetes mellitus, type 2) (HCC)   COPD (chronic obstructive pulmonary disease) (HCC)   Knee effusion    Follow-up recommendations Follow-up with PCP in 3-5 days , including all  additional recommended appointments as below Follow-up CBC, CMP in 3-5 days Follow up with Dr. Lynann Bologna as symptoms warrant. Patient may benefit from outpatient rheumatology evaluation     Medication List    STOP taking these medications        losartan 50 MG tablet  Commonly known as:  COZAAR      TAKE these medications        albuterol (2.5 MG/3ML) 0.083% nebulizer solution  Commonly known as:  PROVENTIL  Take 3 mLs (2.5 mg total) by nebulization every 6 (six) hours as needed for wheezing or shortness of breath.     alum & mag hydroxide-simeth 200-200-20 MG/5ML suspension  Commonly known as:  MAALOX/MYLANTA  Take 30 mLs by mouth every 6 (six) hours as needed for indigestion or heartburn (dyspepsia).     amLODipine 10 MG tablet  Commonly known as:  NORVASC  Take 15 mg by mouth daily.     atorvastatin 80 MG tablet  Commonly known as:  LIPITOR  Take 80 mg by mouth daily.     BOOST GLUCOSE CONTROL Liqd  Take 237 mLs by mouth daily.     fluticasone 50 MCG/ACT nasal spray  Commonly known as:  FLONASE  Place 2 sprays into both nostrils at bedtime.     FLUZONE HIGH-DOSE 0.5 ML Susy  Generic drug:  Influenza Vac Split High-Dose  ADM 0.5ML IM UTD     gabapentin 600 MG tablet  Commonly known as:  NEURONTIN  Take 600 mg by mouth daily.     HYDROcodone-acetaminophen 5-325 MG tablet  Commonly known as:  NORCO/VICODIN  Take 1 tablet by mouth every 4 (four) hours as needed for severe  pain.     insulin aspart 100 UNIT/ML injection  Commonly known as:  NOVOLOG  3 times a day before meals  QuestionAnswerComment Correction coverage:Sensitive (thin, NPO, renal) CBG < 70:implement hypoglycemia protocol CBG 70 - 120:0 units CBG 121 - 150:1 unit CBG 151 - 200:2 units CBG 201 - 250:3 units CBG 251 - 300:5 units CBG 301 - 350:7 units CBG 351 - 4009 units CBG > 400call MD and obtain STAT lab verification     pantoprazole 40 MG tablet  Commonly known as:  PROTONIX  Take 40 mg by mouth 2 (two) times daily.     potassium chloride 10 MEQ CR capsule  Commonly known as:  MICRO-K  Take 10 mEq by mouth daily.     predniSONE 10 MG tablet  Commonly known as:  DELTASONE  5 tablets 5 days, 4 tablets 5 days, 3 tablets 5 days, 2 tablets 5 days, 1 tablet 5 days, half a tablet 5 days then discontinue     tamsulosin 0.4 MG Caps capsule  Commonly known as:  FLOMAX  Take 1 capsule (0.4 mg total) by mouth daily.         Discharge Condition: Stable   Discharge Instructions   Discharge Instructions    Diet - low sodium heart healthy  Complete by:  As directed      Increase activity slowly    Complete by:  As directed            Allergies  Allergen Reactions  . Valacyclovir Hcl Rash      Disposition: 01-Home or Self Care   Consults: Orthopedics     Significant Diagnostic Studies:  Dg Knee Left Port  01/06/2015  CLINICAL DATA:  Severe pain in both knees with increased swelling EXAM: PORTABLE LEFT KNEE - 1-2 VIEW COMPARISON:  None. FINDINGS: There is a moderate suprapatellar joint effusion. There is no acute fracture or subluxation identified. No radio-opaque foreign body or soft tissue calcification. Moderate patellofemoral and medial compartment osteoarthritis. IMPRESSION: 1. Joint effusion. 2. Osteoarthritis. Electronically Signed   By: Kerby Moors M.D.   On: 01/06/2015 16:18   Dg Knee Right Port  01/06/2015  CLINICAL DATA:  Severe  bilateral knee pain and swelling. No reported injury. EXAM: PORTABLE RIGHT KNEE - 1-2 VIEW COMPARISON:  None. FINDINGS: There is a small suprapatellar right knee joint effusion with associated curvilinear calcifications in the suprapatellar right knee joint space, which could indicate chondrocalcinosis. No fracture, dislocation or suspicious focal osseous lesion. Mild joint space narrowing with tiny marginal osteophytes in the medial compartment. Relatively preserved lateral compartment. Small marginal patellar osteophytes. Prominent vascular calcifications in the posterior soft tissues. Small superior right patellar enthesophyte. IMPRESSION: 1. Small suprapatellar right knee joint effusion. 2. Possible chondrocalcinosis in the right knee suprapatellar joint space. 3. Mild medial and patellofemoral compartment osteoarthritis in the right knee joint. Electronically Signed   By: Ilona Sorrel M.D.   On: 01/06/2015 16:16      Filed Weights   01/06/15 0430  Weight: 69.4 kg (153 lb)     Microbiology: Recent Results (from the past 240 hour(s))  Body fluid culture     Status: None (Preliminary result)   Collection Time: 01/06/15  4:50 PM  Result Value Ref Range Status   Specimen Description KNEE LEFT SYNOVIAL FLUID  Final   Special Requests NONE  Final   Gram Stain   Final    ABUNDANT WBC PRESENT,BOTH PMN AND MONONUCLEAR NO ORGANISMS SEEN Performed at [facility]    Culture PENDING  Incomplete   Report Status PENDING  Incomplete  Culture, Urine     Status: None (Preliminary result)   Collection Time: 01/07/15  4:17 PM  Result Value Ref Range Status   Specimen Description URINE, CLEAN CATCH  Final   Special Requests NONE  Final   Culture   Final    TOO YOUNG TO READ Performed at [facility]    Report Status PENDING  Incomplete       Blood Culture    Component Value Date/Time   SDES URINE, CLEAN CATCH 01/07/2015 1617   [place] 01/07/2015 1617   CULT   01/07/2015 1617    TOO YOUNG TO READ Performed at [place] PENDING 01/07/2015 1617      Labs: Results for orders placed or performed during the hospital encounter of 01/05/15 (from the past 48 hour(s))  Glucose, capillary     Status: Abnormal   Collection Time: 01/06/15 12:36 PM  Result Value Ref Range   Glucose-Capillary 189 (H) 65 - 99 mg/dL  Body fluid culture     Status: None (Preliminary result)   Collection Time: 01/06/15  4:50 PM  Result Value Ref Range   Specimen Description KNEE LEFT SYNOVIAL FLUID  Special Requests NONE    Gram Stain      ABUNDANT WBC PRESENT,BOTH PMN AND MONONUCLEAR NO ORGANISMS SEEN Performed at [facility]    Culture PENDING    Report Status PENDING   Synovial cell count + diff, w/ crystals     Status: Abnormal   Collection Time: 01/06/15  4:58 PM  Result Value Ref Range   Color, Synovial YELLOW YELLOW   Appearance-Synovial CLOUDY (A) CLEAR   Crystals, Fluid INTRACELLULAR CALCIUM PYROPHOSPHATE CRYSTALS    WBC, Synovial 15320 (H) 0 - 200 /cu mm   Neutrophil, Synovial 96 (H) 0 - 25 %   Lymphocytes-Synovial Fld 1 0 - 20 %   Monocyte-Macrophage-Synovial Fluid 3 (L) 50 - 90 %   Eosinophils-Synovial 0 0 - 1 %   Other Cells-SYN NONE   C-reactive protein     Status: Abnormal   Collection Time: 01/06/15  5:06 PM  Result Value Ref Range   CRP 36.0 (H) <1.0 mg/dL    Comment: Performed at [facility]  Uric acid     Status: None   Collection Time: 01/06/15  5:16 PM  Result Value Ref Range   Uric Acid, Serum 5.2 2.3 - 6.6 mg/dL  Blood gas, arterial     Status: Abnormal   Collection Time: 01/06/15  6:04 PM  Result Value Ref Range   FIO2 0.21    Delivery systems ROOM AIR    pH, Arterial 7.438 7.350 - 7.450   pCO2 arterial 32.2 (L) 35.0 - 45.0 mmHg   pO2, Arterial 75.4 (L) 80.0 - 100.0 mmHg   Bicarbonate 21.4 20.0 - 24.0 mEq/L   TCO2 18.9 0 - 100 mmol/L   Acid-base deficit 1.5 0.0 - 2.0 mmol/L   O2  Saturation 94.4 %   Patient temperature 98.6    Collection site BRACHIAL ARTERY    Drawn by [id number]    Sample type ARTERIAL   Glucose, capillary     Status: Abnormal   Collection Time: 01/06/15  6:13 PM  Result Value Ref Range   Glucose-Capillary 130 (H) 65 - 99 mg/dL  Glucose, capillary     Status: Abnormal   Collection Time: 01/06/15  8:40 PM  Result Value Ref Range   Glucose-Capillary 193 (H) 65 - 99 mg/dL  Glucose, capillary     Status: Abnormal   Collection Time: 01/07/15  2:26 AM  Result Value Ref Range   Glucose-Capillary 160 (H) 65 - 99 mg/dL  Hemoglobin A1c     Status: Abnormal   Collection Time: 01/07/15  6:02 AM  Result Value Ref Range   Hgb A1c MFr Bld 6.2 (H) 4.8 - 5.6 %    Comment: (NOTE)         Pre-diabetes: 5.7 - 6.4         Diabetes: >6.4         Glycemic control for adults with diabetes: <7.0    Mean Plasma Glucose 131 mg/dL    Comment: (NOTE) Performed At: [facility] [place], [place] [id number] Lindon Romp MD SK:[id number]   Basic metabolic panel     Status: Abnormal   Collection Time: 01/07/15  6:02 AM  Result Value Ref Range   Sodium 140 135 - 145 mmol/L   Potassium 3.9 3.5 - 5.1 mmol/L   Chloride 108 101 - 111 mmol/L   CO2 23 22 - 32 mmol/L   Glucose, Bld 157 (H) 65 - 99 mg/dL   BUN 38 (H)  6 - 20 mg/dL   Creatinine, Ser 1.14 (H) 0.44 - 1.00 mg/dL   Calcium 8.7 (L) 8.9 - 10.3 mg/dL   GFR calc non Af Amer 45 (L) >60 mL/min   GFR calc Af Amer 52 (L) >60 mL/min    Comment: (NOTE) The eGFR has been calculated using the CKD EPI equation. This calculation has not been validated in all clinical situations. eGFR's persistently <60 mL/min signify possible Chronic Kidney Disease.    Anion gap 9 5 - 15  Glucose, capillary     Status: Abnormal   Collection Time: 01/07/15  8:04 AM  Result Value Ref Range   Glucose-Capillary 141 (H) 65 - 99 mg/dL  Glucose, capillary     Status: Abnormal   Collection Time: 01/07/15 12:56 PM   Result Value Ref Range   Glucose-Capillary 169 (H) 65 - 99 mg/dL  Culture, Urine     Status: None (Preliminary result)   Collection Time: 01/07/15  4:17 PM  Result Value Ref Range   Specimen Description URINE, CLEAN CATCH    Special Requests NONE    Culture      TOO YOUNG TO READ Performed at [facility]    Report Status PENDING   Urinalysis, Routine w reflex microscopic (not at [facility])     Status: Abnormal   Collection Time: 01/07/15  4:17 PM  Result Value Ref Range   Color, Urine YELLOW YELLOW   APPearance CLEAR CLEAR   Specific Gravity, Urine 1.016 1.005 - 1.030   pH 6.0 5.0 - 8.0   Glucose, UA NEGATIVE NEGATIVE mg/dL   Hgb urine dipstick NEGATIVE NEGATIVE   Bilirubin Urine NEGATIVE NEGATIVE   Ketones, ur NEGATIVE NEGATIVE mg/dL   Protein, ur 30 (A) NEGATIVE mg/dL   Nitrite NEGATIVE NEGATIVE   Leukocytes, UA NEGATIVE NEGATIVE  Urine microscopic-add on     Status: Abnormal   Collection Time: 01/07/15  4:17 PM  Result Value Ref Range   Squamous Epithelial / LPF NONE SEEN NONE SEEN    Comment: Please note change in reference range.   WBC, UA 0-5 0 - 5 WBC/hpf    Comment: Please note change in reference range.   RBC / HPF NONE SEEN 0 - 5 RBC/hpf    Comment: Please note change in reference range.   Bacteria, UA RARE (A) NONE SEEN    Comment: Please note change in reference range.   Casts HYALINE CASTS (A) NEGATIVE   Urine-Other MUCOUS PRESENT   Glucose, capillary     Status: Abnormal   Collection Time: 01/07/15  5:07 PM  Result Value Ref Range   Glucose-Capillary 168 (H) 65 - 99 mg/dL  Glucose, capillary     Status: Abnormal   Collection Time: 01/07/15 10:19 PM  Result Value Ref Range   Glucose-Capillary 156 (H) 65 - 99 mg/dL  CBC     Status: Abnormal   Collection Time: 01/08/15  5:20 AM  Result Value Ref Range   WBC 10.0 4.0 - 10.5 K/uL   RBC 4.10 3.87 - 5.11 MIL/uL   Hemoglobin 11.8 (L) 12.0 - 15.0 g/dL   HCT 35.4 (L) 36.0 - 46.0 %   MCV 86.3 78.0 - 100.0  fL   MCH 28.8 26.0 - 34.0 pg   MCHC 33.3 30.0 - 36.0 g/dL   RDW 15.3 11.5 - 15.5 %   Platelets 303 150 - 400 K/uL  Basic metabolic panel     Status: Abnormal   Collection Time: 01/08/15  5:20  AM  Result Value Ref Range   Sodium 137 135 - 145 mmol/L   Potassium 4.2 3.5 - 5.1 mmol/L   Chloride 109 101 - 111 mmol/L   CO2 20 (L) 22 - 32 mmol/L   Glucose, Bld 152 (H) 65 - 99 mg/dL   BUN 36 (H) 6 - 20 mg/dL   Creatinine, Ser 0.87 0.44 - 1.00 mg/dL   Calcium 8.3 (L) 8.9 - 10.3 mg/dL   GFR calc non Af Amer >60 >60 mL/min   GFR calc Af Amer >60 >60 mL/min    Comment: (NOTE) The eGFR has been calculated using the CKD EPI equation. This calculation has not been validated in all clinical situations. eGFR's persistently <60 mL/min signify possible Chronic Kidney Disease.    Anion gap 8 5 - 15  Glucose, capillary     Status: Abnormal   Collection Time: 01/08/15  7:34 AM  Result Value Ref Range   Glucose-Capillary 131 (H) 65 - 99 mg/dL     Lipid Panel  No results found for: CHOL, TRIG, HDL, CHOLHDL, VLDL, LDLCALC, LDLDIRECT   Lab Results  Component Value Date   HGBA1C 6.2* 01/07/2015     Lab Results  Component Value Date   CREATININE 0.87 01/08/2015     HPI :JAKYIAH BRIONES is a 80 y.o. female with a history of HTN, DM2, COPD, Hyperlipidemia and Pseudogout who presents to the ED with complaints of severe 10/10 pain in both knees with increased swelling. She has not been able to walk due to her pain. She denies any fevers or chills. She reports that in the past her PCP would draw off the fluid on her knees. She was administered IV solumedrol, and IV Morphine for pain and referred for admission.   HOSPITAL COURSE:    Bilateral Knee pain, Effusion, Pseudogout; Received IV solumedrol. Now transition to prednisone 50 mg a day, continue slow prednisone taper  Ortho consulted, evaluation for arthrocentesis. Had arthrocentesis 11-19, fluids consistent with CPPD.   No growth from synovial fluid Low dose Vicodin, monitor for oversedation.   Encephalopathy Patient was noted to be lethargic secondary to IV Dilaudid in the hospital Continue Vicodin judiciously for pain control ABG with no hypercarbia.     Urine retention;  UA negative for infection Started on flomax.   Essential hypertension; continue Norvasc  Diabetes type 2: Continue SSI. While patient is on steroids at the nursing home  COPD; Albuterol PRN.   Hyperlipidemia: Continue with statin.   Code Status: Full Code.   Discharge Exam:    Blood pressure 132/58, pulse 78, temperature 98.7 F (37.1 C), temperature source Oral, resp. rate 16, height $RemoveBe'5\' 6"'[id number]$  (1.676 m), weight 69.4 kg (153 lb), SpO2 96 %.   General: Alert in no acute distress  Cardiovascular: S 1, S 2 RRR  Respiratory: CTA  Abdomen: BS present, soft   Musculoskeletal: Knee swelling more on the left    Follow-up Information    Follow up with [name], MD. Schedule an appointment as soon as possible for a visit in 3 days.   Specialty:  Internal Medicine   Contact information:   [place] [id number] [phone]       Signed: Reyne Dumas 01/08/2015, 11:21 AM        Time spent >45 mins

## 2015-01-08 NOTE — Discharge Summary (Deleted)
Physician Discharge Summary  [name] MRN: [id number] DOB/AGE: 08/20/1934 80 y.o.  PCP: Richard W Tisovec, MD   Admit date: 01/05/2015 Discharge date: 01/08/2015  Discharge Diagnoses:     Principal Problem:   Pseudogout Active Problems:   Hyperlipidemia   Essential hypertension   DM type 2 (diabetes mellitus, type 2) (HCC)   COPD (chronic obstructive pulmonary disease) (HCC)   Knee effusion    Follow-up recommendations Follow-up with PCP in 3-5 days , including all  additional recommended appointments as below Follow-up CBC, CMP in 3-5 days Follow up with Dr. Voytek as symptoms warrant. Patient may benefit from outpatient rheumatology evaluation     Medication List    STOP taking these medications        losartan 50 MG tablet  Commonly known as:  COZAAR      TAKE these medications        albuterol (2.5 MG/3ML) 0.083% nebulizer solution  Commonly known as:  PROVENTIL  Take 3 mLs (2.5 mg total) by nebulization every 6 (six) hours as needed for wheezing or shortness of breath.     alum & mag hydroxide-simeth 200-200-20 MG/5ML suspension  Commonly known as:  MAALOX/MYLANTA  Take 30 mLs by mouth every 6 (six) hours as needed for indigestion or heartburn (dyspepsia).     amLODipine 10 MG tablet  Commonly known as:  NORVASC  Take 15 mg by mouth daily.     atorvastatin 80 MG tablet  Commonly known as:  LIPITOR  Take 80 mg by mouth daily.     BOOST GLUCOSE CONTROL Liqd  Take 237 mLs by mouth daily.     fluticasone 50 MCG/ACT nasal spray  Commonly known as:  FLONASE  Place 2 sprays into both nostrils at bedtime.     FLUZONE HIGH-DOSE 0.5 ML Susy  Generic drug:  Influenza Vac Split High-Dose  ADM 0.5ML IM UTD     gabapentin 600 MG tablet  Commonly known as:  NEURONTIN  Take 600 mg by mouth daily.     HYDROcodone-acetaminophen 5-325 MG tablet  Commonly known as:  NORCO/VICODIN  Take 1 tablet by mouth every 4 (four) hours as needed for severe  pain.     pantoprazole 40 MG tablet  Commonly known as:  PROTONIX  Take 40 mg by mouth 2 (two) times daily.     potassium chloride 10 MEQ CR capsule  Commonly known as:  MICRO-K  Take 10 mEq by mouth daily.     predniSONE 10 MG tablet  Commonly known as:  DELTASONE  5 tablets 5 days, 4 tablets 5 days, 3 tablets 5 days, 2 tablets 5 days, 1 tablet 5 days, half a tablet 5 days then discontinue     tamsulosin 0.4 MG Caps capsule  Commonly known as:  FLOMAX  Take 1 capsule (0.4 mg total) by mouth daily.         Discharge Condition: Stable   Discharge Instructions       Discharge Instructions    Diet - low sodium heart healthy    Complete by:  As directed      Increase activity slowly    Complete by:  As directed            Allergies  Allergen Reactions  . Valacyclovir Hcl Rash      Disposition: 01-Home or Self Care   Consults: Orthopedics     Significant Diagnostic Studies:  Dg Knee Left Port  01/06/2015  CLINICAL DATA:  Severe   pain in both knees with increased swelling EXAM: PORTABLE LEFT KNEE - 1-2 VIEW COMPARISON:  None. FINDINGS: There is a moderate suprapatellar joint effusion. There is no acute fracture or subluxation identified. No radio-opaque foreign body or soft tissue calcification. Moderate patellofemoral and medial compartment osteoarthritis. IMPRESSION: 1. Joint effusion. 2. Osteoarthritis. Electronically Signed   By: Taylor  Stroud M.D.   On: 01/06/2015 16:18   Dg Knee Right Port  01/06/2015  CLINICAL DATA:  Severe bilateral knee pain and swelling. No reported injury. EXAM: PORTABLE RIGHT KNEE - 1-2 VIEW COMPARISON:  None. FINDINGS: There is a small suprapatellar right knee joint effusion with associated curvilinear calcifications in the suprapatellar right knee joint space, which could indicate chondrocalcinosis. No fracture, dislocation or suspicious focal osseous lesion. Mild joint space narrowing with tiny marginal osteophytes in the  medial compartment. Relatively preserved lateral compartment. Small marginal patellar osteophytes. Prominent vascular calcifications in the posterior soft tissues. Small superior right patellar enthesophyte. IMPRESSION: 1. Small suprapatellar right knee joint effusion. 2. Possible chondrocalcinosis in the right knee suprapatellar joint space. 3. Mild medial and patellofemoral compartment osteoarthritis in the right knee joint. Electronically Signed   By: Jason A Poff M.D.   On: 01/06/2015 16:16      Filed Weights   01/06/15 0430  Weight: 69.4 kg (153 lb)     Microbiology: Recent Results (from the past 240 hour(s))  Body fluid culture     Status: None (Preliminary result)   Collection Time: 01/06/15  4:50 PM  Result Value Ref Range Status   Specimen Description KNEE LEFT SYNOVIAL FLUID  Final   Special Requests NONE  Final   Gram Stain   Final    ABUNDANT WBC PRESENT,BOTH PMN AND MONONUCLEAR NO ORGANISMS SEEN Performed at [facility]    Culture PENDING  Incomplete   Report Status PENDING  Incomplete  Culture, Urine     Status: None (Preliminary result)   Collection Time: 01/07/15  4:17 PM  Result Value Ref Range Status   Specimen Description URINE, CLEAN CATCH  Final   Special Requests NONE  Final   Culture   Final    TOO YOUNG TO READ Performed at [facility]    Report Status PENDING  Incomplete       Blood Culture    Component Value Date/Time   SDES URINE, CLEAN CATCH 01/07/2015 1617   SPECREQUEST NONE 01/07/2015 1617   CULT  01/07/2015 1617    TOO YOUNG TO READ Performed at [facility]    REPTSTATUS PENDING 01/07/2015 1617      Labs: Results for orders placed or performed during the hospital encounter of 01/05/15 (from the past 48 hour(s))  Glucose, capillary     Status: Abnormal   Collection Time: 01/06/15 12:36 PM  Result Value Ref Range   Glucose-Capillary 189 (H) 65 - 99 mg/dL  Body fluid culture     Status: None (Preliminary  result)   Collection Time: 01/06/15  4:50 PM  Result Value Ref Range   Specimen Description KNEE LEFT SYNOVIAL FLUID    Special Requests NONE    Gram Stain      ABUNDANT WBC PRESENT,BOTH PMN AND MONONUCLEAR NO ORGANISMS SEEN Performed at [facility]    Culture PENDING    Report Status PENDING   Synovial cell count + diff, w/ crystals     Status: Abnormal   Collection Time: 01/06/15  4:58 PM  Result Value Ref Range   Color, Synovial YELLOW   YELLOW   Appearance-Synovial CLOUDY (A) CLEAR   Crystals, Fluid INTRACELLULAR CALCIUM PYROPHOSPHATE CRYSTALS    WBC, Synovial 15320 (H) 0 - 200 /cu mm   Neutrophil, Synovial 96 (H) 0 - 25 %   Lymphocytes-Synovial Fld 1 0 - 20 %   Monocyte-Macrophage-Synovial Fluid 3 (L) 50 - 90 %   Eosinophils-Synovial 0 0 - 1 %   Other Cells-SYN NONE   C-reactive protein     Status: Abnormal   Collection Time: 01/06/15  5:06 PM  Result Value Ref Range   CRP 36.0 (H) <1.0 mg/dL    Comment: Performed at [facility]  Uric acid     Status: None   Collection Time: 01/06/15  5:16 PM  Result Value Ref Range   Uric Acid, Serum 5.2 2.3 - 6.6 mg/dL  Blood gas, arterial     Status: Abnormal   Collection Time: 01/06/15  6:04 PM  Result Value Ref Range   FIO2 0.21    Delivery systems ROOM AIR    pH, Arterial 7.438 7.350 - 7.450   pCO2 arterial 32.2 (L) 35.0 - 45.0 mmHg   pO2, Arterial 75.4 (L) 80.0 - 100.0 mmHg   Bicarbonate 21.4 20.0 - 24.0 mEq/L   TCO2 18.9 0 - 100 mmol/L   Acid-base deficit 1.5 0.0 - 2.0 mmol/L   O2 Saturation 94.4 %   Patient temperature 98.6    Collection site BRACHIAL ARTERY    Drawn by [id number]    Sample type ARTERIAL   Glucose, capillary     Status: Abnormal   Collection Time: 01/06/15  6:13 PM  Result Value Ref Range   Glucose-Capillary 130 (H) 65 - 99 mg/dL  Glucose, capillary     Status: Abnormal   Collection Time: 01/06/15  8:40 PM  Result Value Ref Range   Glucose-Capillary 193 (H) 65 - 99 mg/dL  Glucose,  capillary     Status: Abnormal   Collection Time: 01/07/15  2:26 AM  Result Value Ref Range   Glucose-Capillary 160 (H) 65 - 99 mg/dL  Hemoglobin A1c     Status: Abnormal   Collection Time: 01/07/15  6:02 AM  Result Value Ref Range   Hgb A1c MFr Bld 6.2 (H) 4.8 - 5.6 %    Comment: (NOTE)         Pre-diabetes: 5.7 - 6.4         Diabetes: >6.4         Glycemic control for adults with diabetes: <7.0    Mean Plasma Glucose 131 mg/dL    Comment: (NOTE) Performed At: [facility] [place], [place] [id number] Lindon Romp MD HU:[id number]   Basic metabolic panel     Status: Abnormal   Collection Time: 01/07/15  6:02 AM  Result Value Ref Range   Sodium 140 135 - 145 mmol/L   Potassium 3.9 3.5 - 5.1 mmol/L   Chloride 108 101 - 111 mmol/L   CO2 23 22 - 32 mmol/L   Glucose, Bld 157 (H) 65 - 99 mg/dL   BUN 38 (H) 6 - 20 mg/dL   Creatinine, Ser 1.14 (H) 0.44 - 1.00 mg/dL   Calcium 8.7 (L) 8.9 - 10.3 mg/dL   GFR calc non Af Amer 45 (L) >60 mL/min   GFR calc Af Amer 52 (L) >60 mL/min    Comment: (NOTE) The eGFR has been calculated using the CKD EPI equation. This calculation has not been validated in all clinical situations. eGFR's persistently <  60 mL/min signify possible Chronic Kidney Disease.    Anion gap 9 5 - 15  Glucose, capillary     Status: Abnormal   Collection Time: 01/07/15  8:04 AM  Result Value Ref Range   Glucose-Capillary 141 (H) 65 - 99 mg/dL  Glucose, capillary     Status: Abnormal   Collection Time: 01/07/15 12:56 PM  Result Value Ref Range   Glucose-Capillary 169 (H) 65 - 99 mg/dL  Culture, Urine     Status: None (Preliminary result)   Collection Time: 01/07/15  4:17 PM  Result Value Ref Range   Specimen Description URINE, CLEAN CATCH    Special Requests NONE    Culture      TOO YOUNG TO READ Performed at [facility]    Report Status PENDING   Urinalysis, Routine w reflex microscopic (not at [facility])     Status: Abnormal    Collection Time: 01/07/15  4:17 PM  Result Value Ref Range   Color, Urine YELLOW YELLOW   APPearance CLEAR CLEAR   Specific Gravity, Urine 1.016 1.005 - 1.030   pH 6.0 5.0 - 8.0   Glucose, UA NEGATIVE NEGATIVE mg/dL   Hgb urine dipstick NEGATIVE NEGATIVE   Bilirubin Urine NEGATIVE NEGATIVE   Ketones, ur NEGATIVE NEGATIVE mg/dL   Protein, ur 30 (A) NEGATIVE mg/dL   Nitrite NEGATIVE NEGATIVE   Leukocytes, UA NEGATIVE NEGATIVE  Urine microscopic-add on     Status: Abnormal   Collection Time: 01/07/15  4:17 PM  Result Value Ref Range   Squamous Epithelial / LPF NONE SEEN NONE SEEN    Comment: Please note change in reference range.   WBC, UA 0-5 0 - 5 WBC/hpf    Comment: Please note change in reference range.   RBC / HPF NONE SEEN 0 - 5 RBC/hpf    Comment: Please note change in reference range.   Bacteria, UA RARE (A) NONE SEEN    Comment: Please note change in reference range.   Casts HYALINE CASTS (A) NEGATIVE   Urine-Other MUCOUS PRESENT   Glucose, capillary     Status: Abnormal   Collection Time: 01/07/15  5:07 PM  Result Value Ref Range   Glucose-Capillary 168 (H) 65 - 99 mg/dL  Glucose, capillary     Status: Abnormal   Collection Time: 01/07/15 10:19 PM  Result Value Ref Range   Glucose-Capillary 156 (H) 65 - 99 mg/dL  CBC     Status: Abnormal   Collection Time: 01/08/15  5:20 AM  Result Value Ref Range   WBC 10.0 4.0 - 10.5 K/uL   RBC 4.10 3.87 - 5.11 MIL/uL   Hemoglobin 11.8 (L) 12.0 - 15.0 g/dL   HCT 35.4 (L) 36.0 - 46.0 %   MCV 86.3 78.0 - 100.0 fL   MCH 28.8 26.0 - 34.0 pg   MCHC 33.3 30.0 - 36.0 g/dL   RDW 15.3 11.5 - 15.5 %   Platelets 303 150 - 400 K/uL  Basic metabolic panel     Status: Abnormal   Collection Time: 01/08/15  5:20 AM  Result Value Ref Range   Sodium 137 135 - 145 mmol/L   Potassium 4.2 3.5 - 5.1 mmol/L   Chloride 109 101 - 111 mmol/L   CO2 20 (L) 22 - 32 mmol/L   Glucose, Bld 152 (H) 65 - 99 mg/dL   BUN 36 (H) 6 - 20 mg/dL   Creatinine,  Ser 0.87 0.44 - 1.00 mg/dL   Calcium 8.3 (  L) 8.9 - 10.3 mg/dL   GFR calc non Af Amer >60 >60 mL/min   GFR calc Af Amer >60 >60 mL/min    Comment: (NOTE) The eGFR has been calculated using the CKD EPI equation. This calculation has not been validated in all clinical situations. eGFR's persistently <60 mL/min signify possible Chronic Kidney Disease.    Anion gap 8 5 - 15  Glucose, capillary     Status: Abnormal   Collection Time: 01/08/15  7:34 AM  Result Value Ref Range   Glucose-Capillary 131 (H) 65 - 99 mg/dL     Lipid Panel  No results found for: CHOL, TRIG, HDL, CHOLHDL, VLDL, LDLCALC, LDLDIRECT   Lab Results  Component Value Date   HGBA1C 6.2* 01/07/2015     Lab Results  Component Value Date   CREATININE 0.87 01/08/2015     HPI :[name] is a 80 y.o. female with a history of HTN, DM2, COPD, Hyperlipidemia and Pseudogout who presents to the ED with complaints of severe 10/10 pain in both knees with increased swelling. She has not been able to walk due to her pain. She denies any fevers or chills. She reports that in the past her PCP would draw off the fluid on her knees. She was administered IV solumedrol, and IV Morphine for pain and referred for admission.   HOSPITAL COURSE:    Bilateral Knee pain, Effusion, Pseudogout; Received IV solumedrol. Now transition to prednisone 50 mg a day, continue slow prednisone taper  Ortho consulted, evaluation for arthrocentesis. Had arthrocentesis 11-19, fluids consistent with CPPD.  No growth from synovial fluid Low dose Vicodin, monitor for oversedation.   Encephalopathy Patient was noted to be lethargic secondary to IV Dilaudid in the hospital Continue Vicodin judiciously for pain control ABG with no hypercarbia.     Urine retention;  UA negative for infection Started on flomax.   Essential hypertension; continue Norvasc  Diabetes type 2: Continue SSI. While patient is on steroids at the  nursing home  COPD; Albuterol PRN.   Hyperlipidemia: Continue with statin.   Code Status: Full Code.   Discharge Exam:    Blood pressure 132/58, pulse 78, temperature 98.7 F (37.1 C), temperature source Oral, resp. rate 16, height 5' 6" (1.676 m), weight 69.4 kg (153 lb), SpO2 96 %.   General: Alert in no acute distress  Cardiovascular: S 1, S 2 RRR  Respiratory: CTA  Abdomen: BS present, soft   Musculoskeletal: Knee swelling more on the left      Signed: ABROL,NAYANA 01/08/2015, 11:16 AM        Time spent >45 mins   

## 2015-01-08 NOTE — Progress Notes (Signed)
RT placed patient on CPAP. Patient setting is auto 5-20 cm H20. Sterile water was placed in water chamber for humidification. 2 liters oxygen bleed into tubing. Patient is tolerating well. Encouraged patient to call if she needs assistance. RT will continue to monitor.

## 2015-01-08 NOTE — Evaluation (Signed)
Physical Therapy Evaluation Patient Details Name: [name] MRN: [id number] DOB: 01/11/1935 Today's Date: 01/08/2015   History of Present Illness  [name] is a 80 y.o. female with a history of HTN, DM2, COPD, Hyperlipidemia and Pseudogout who presents to the ED with complaints of severe 10/10 pain in both knees with increased swelling. She has not been able to walk due to her pain. She denies any fevers or chills. She reports that in the past her PCP would draw off the fluid on her knees. She was administered IV solumedrol, and IV Morphine for pain and referred for admission.   Clinical Impression  Patient did participate in PT eval, able  To stand and pivot to recliner. Pt admitted with above diagnosis. Pt currently with functional limitations due to the deficits listed below (see PT Problem List).  Pt will benefit from skilled PT to increase their independence and safety with mobility to allow discharge to the venue listed below.       Follow Up Recommendations SNF;Supervision/Assistance - 24 hour    Equipment Recommendations  None recommended by PT    Recommendations for Other Services       Precautions / Restrictions Precautions Precautions: Fall Precaution Comments: l knee pain Restrictions Weight Bearing Restrictions: No      Mobility  Bed Mobility Overal bed mobility: Needs Assistance Bed Mobility: Supine to Sit     Supine to sit: Independent;Mod assist     General bed mobility comments: extra time and  support the L leg  Transfers Overall transfer level: Needs assistance Equipment used: Rolling walker (2 wheeled) Transfers: Sit to/from [facility] Sit to Stand: Mod assist;+2 safety/equipment Stand pivot transfers: Mod assist;+2 safety/equipment       General transfer comment: extra time  for standing, cues for safety and position of the L  leg. only took small antalgic steps to  get  to  recliner.  Ambulation/Gait                Stairs            Wheelchair Mobility    Modified Rankin (Stroke Patients Only)       Balance Overall balance assessment: Needs assistance;History of Falls Sitting-balance support: Bilateral upper extremity supported;Feet supported Sitting balance-Leahy Scale: Good                                       Pertinent Vitals/Pain Pain Assessment: 0-10 Pain Score: 8  Pain Location: L knee Pain Descriptors / Indicators: Aching;Grimacing;Guarding;Sore Pain Intervention(s): Limited activity within patient's tolerance;Monitored during session;Premedicated before session;Ice applied    Home Living Family/patient expects to be discharged to:: Private residence Living Arrangements: Alone Available Help at Discharge: Other (Comment) Type of Home: House Home Access: Stairs to enter Entrance Stairs-Rails: None Entrance Stairs-Number of Steps: 2 Home Layout: Two level;Other (Comment);Full bath on main level Home Equipment: Walker - 4 wheels;Cane - quad;Walker - 2 wheels      Prior Function                 Hand Dominance        Extremity/Trunk Assessment   Upper Extremity Assessment: Defer to OT evaluation           Lower Extremity Assessment: LLE deficits/detail   LLE Deficits / Details: flexed knee to about 70 degrees sitting on edge of bed. decreased weight on the  leg  Cervical / Trunk Assessment: Normal  Communication      Cognition Arousal/Alertness: Awake/alert Behavior During Therapy: WFL for tasks assessed/performed Overall Cognitive Status: Within Functional Limits for tasks assessed                      General Comments      Exercises        Assessment/Plan    PT Assessment Patient needs continued PT services  PT Diagnosis Difficulty walking;Acute pain   PT Problem List Decreased strength;Decreased range of motion;Decreased activity tolerance;Decreased  balance;Decreased mobility;Decreased knowledge of use of DME;Decreased safety awareness;Decreased knowledge of precautions;Pain  PT Treatment Interventions DME instruction;Gait training;Functional mobility training;Therapeutic activities;Therapeutic exercise;Patient/family education   PT Goals (Current goals can be found in the Care Plan section) Acute Rehab PT Goals Patient Stated Goal: to go home alone PT Goal Formulation: With patient Time For Goal Achievement: 01/22/15 Potential to Achieve Goals: Good    Frequency Min 3X/week   Barriers to discharge Decreased caregiver support;Inaccessible home environment      Co-evaluation               End of Session Equipment Utilized During Treatment: Gait belt Activity Tolerance: Patient limited by pain Patient left: in chair;with call bell/phone within reach;with chair alarm set Nurse Communication: Mobility status    Functional Assessment Tool Used: clinical judgement Functional Limitation: Mobility: Walking and moving around Mobility: Walking and Moving Around Current Status [id number]): At least 60 percent but less than 80 percent impaired, limited or restricted Mobility: Walking and Moving Around Goal Status [phone]): At least 1 percent but less than 20 percent impaired, limited or restricted    Time: 0937-0959 PT Time Calculation (min) (ACUTE ONLY): 22 min   Charges:   PT Evaluation $Initial PT Evaluation Tier I: 1 Procedure     PT G Codes:   PT G-Codes **NOT FOR INPATIENT CLASS** Functional Assessment Tool Used: clinical judgement Functional Limitation: Mobility: Walking and moving around Mobility: Walking and Moving Around Current Status [id number]): At least 60 percent but less than 80 percent impaired, limited or restricted Mobility: Walking and Moving Around Goal Status [phone]): At least 1 percent but less than 20 percent impaired, limited or restricted    Claretha Cooper 01/08/2015, 11:22 AM  Tresa Endo  PT [phone]

## 2015-01-08 NOTE — Progress Notes (Signed)
Pt [name]. Facility aware. Facility placing insurance authorization.   DC pending Auth.    Pete Pelt LCSWA  [id number]

## 2015-01-08 NOTE — Progress Notes (Signed)
Patient declines the use of nocturnal CPAP tonight. Equipment remains in the room if she should become more compliant. She is aware that RT is available if she should require further assistance tonight. RT will continue to follow.

## 2015-01-08 NOTE — Progress Notes (Signed)
Pt called me into her room and asked me to take her Cpap . She said it was "bothering her" and she couldn't sleep with it.  I took it off per pt request.

## 2015-01-09 DIAGNOSIS — M11262 Other chondrocalcinosis, left knee: Secondary | ICD-10-CM | POA: Diagnosis not present

## 2015-01-09 DIAGNOSIS — I10 Essential (primary) hypertension: Secondary | ICD-10-CM | POA: Diagnosis not present

## 2015-01-09 DIAGNOSIS — R339 Retention of urine, unspecified: Secondary | ICD-10-CM | POA: Diagnosis not present

## 2015-01-09 DIAGNOSIS — M79604 Pain in right leg: Secondary | ICD-10-CM | POA: Diagnosis not present

## 2015-01-09 DIAGNOSIS — R531 Weakness: Secondary | ICD-10-CM | POA: Diagnosis not present

## 2015-01-09 DIAGNOSIS — J449 Chronic obstructive pulmonary disease, unspecified: Secondary | ICD-10-CM | POA: Diagnosis not present

## 2015-01-09 DIAGNOSIS — E785 Hyperlipidemia, unspecified: Secondary | ICD-10-CM | POA: Diagnosis not present

## 2015-01-09 DIAGNOSIS — M11862 Other specified crystal arthropathies, left knee: Secondary | ICD-10-CM | POA: Diagnosis not present

## 2015-01-09 DIAGNOSIS — M25462 Effusion, left knee: Secondary | ICD-10-CM | POA: Diagnosis not present

## 2015-01-09 DIAGNOSIS — K5900 Constipation, unspecified: Secondary | ICD-10-CM | POA: Diagnosis not present

## 2015-01-09 DIAGNOSIS — G4730 Sleep apnea, unspecified: Secondary | ICD-10-CM | POA: Diagnosis not present

## 2015-01-09 DIAGNOSIS — M79605 Pain in left leg: Secondary | ICD-10-CM | POA: Diagnosis not present

## 2015-01-09 DIAGNOSIS — M6281 Muscle weakness (generalized): Secondary | ICD-10-CM | POA: Diagnosis not present

## 2015-01-09 DIAGNOSIS — R262 Difficulty in walking, not elsewhere classified: Secondary | ICD-10-CM | POA: Diagnosis not present

## 2015-01-09 DIAGNOSIS — M11261 Other chondrocalcinosis, right knee: Secondary | ICD-10-CM | POA: Diagnosis not present

## 2015-01-09 DIAGNOSIS — D508 Other iron deficiency anemias: Secondary | ICD-10-CM | POA: Diagnosis not present

## 2015-01-09 DIAGNOSIS — Z5189 Encounter for other specified aftercare: Secondary | ICD-10-CM | POA: Diagnosis not present

## 2015-01-09 DIAGNOSIS — R3915 Urgency of urination: Secondary | ICD-10-CM | POA: Diagnosis not present

## 2015-01-09 DIAGNOSIS — E119 Type 2 diabetes mellitus without complications: Secondary | ICD-10-CM | POA: Diagnosis not present

## 2015-01-09 DIAGNOSIS — G92 Toxic encephalopathy: Secondary | ICD-10-CM | POA: Diagnosis not present

## 2015-01-09 DIAGNOSIS — G629 Polyneuropathy, unspecified: Secondary | ICD-10-CM | POA: Diagnosis not present

## 2015-01-09 DIAGNOSIS — M1120 Other chondrocalcinosis, unspecified site: Secondary | ICD-10-CM | POA: Diagnosis not present

## 2015-01-09 DIAGNOSIS — J309 Allergic rhinitis, unspecified: Secondary | ICD-10-CM | POA: Diagnosis not present

## 2015-01-09 DIAGNOSIS — E43 Unspecified severe protein-calorie malnutrition: Secondary | ICD-10-CM | POA: Diagnosis not present

## 2015-01-09 DIAGNOSIS — T402X5A Adverse effect of other opioids, initial encounter: Secondary | ICD-10-CM | POA: Diagnosis not present

## 2015-01-09 DIAGNOSIS — K219 Gastro-esophageal reflux disease without esophagitis: Secondary | ICD-10-CM | POA: Diagnosis not present

## 2015-01-09 DIAGNOSIS — D649 Anemia, unspecified: Secondary | ICD-10-CM | POA: Diagnosis not present

## 2015-01-09 LAB — BODY FLUID CULTURE: CULTURE: NO GROWTH

## 2015-01-09 LAB — GLUCOSE, CAPILLARY
GLUCOSE-CAPILLARY: 113 mg/dL — AB (ref 65–99)
GLUCOSE-CAPILLARY: 124 mg/dL — AB (ref 65–99)

## 2015-01-09 NOTE — Progress Notes (Signed)
Pt to be d/c today to [facility].   Pt and family agreeable. Confirmed plans with facility.  Plan transfer via EMS. Pick up time is 12:30   [place] 1-16;  6N1-16 Phone: [phone]

## 2015-01-09 NOTE — Discharge Summary (Signed)
Physician Discharge Summary  [name] MRN: [id number] DOB/AGE: 1935/02/01 80 y.o.  PCP: Gaspar Garbe, MD   Admit date: 01/05/2015 Discharge date: 01/09/2015  Discharge Diagnoses:     Principal Problem:  Acute Pseudogout  Active Problems:   Hyperlipidemia   Essential hypertension   DM type 2 (diabetes mellitus, type 2) (HCC)   COPD (chronic obstructive pulmonary disease) (HCC)   Knee effusion    Follow-up recommendations Follow-up with PCP in 3-5 days , including all  additional recommended appointments as below Follow-up CBC, CMP in 3-5 days Follow up with Dr. Charlett Blake as symptoms warrant. Patient may benefit from outpatient rheumatology evaluation     Medication List    STOP taking these medications        losartan 50 MG tablet  Commonly known as:  COZAAR      TAKE these medications        albuterol (2.5 MG/3ML) 0.083% nebulizer solution  Commonly known as:  PROVENTIL  Take 3 mLs (2.5 mg total) by nebulization every 6 (six) hours as needed for wheezing or shortness of breath.     alum & mag hydroxide-simeth 200-200-20 MG/5ML suspension  Commonly known as:  MAALOX/MYLANTA  Take 30 mLs by mouth every 6 (six) hours as needed for indigestion or heartburn (dyspepsia).     amLODipine 10 MG tablet  Commonly known as:  NORVASC  Take 15 mg by mouth daily.     atorvastatin 80 MG tablet  Commonly known as:  LIPITOR  Take 80 mg by mouth daily.     BOOST GLUCOSE CONTROL Liqd  Take 237 mLs by mouth daily.     fluticasone 50 MCG/ACT nasal spray  Commonly known as:  FLONASE  Place 2 sprays into both nostrils at bedtime.     FLUZONE HIGH-DOSE 0.5 ML Susy  Generic drug:  Influenza Vac Split High-Dose  ADM 0.5ML IM UTD     gabapentin 600 MG tablet  Commonly known as:  NEURONTIN  Take 600 mg by mouth daily.     HYDROcodone-acetaminophen 5-325 MG tablet  Commonly known as:  NORCO/VICODIN  Take 1 tablet by mouth every 4 (four) hours as needed for  severe pain.     insulin aspart 100 UNIT/ML injection  Commonly known as:  NOVOLOG  3 times a day before meals  QuestionAnswerComment Correction coverage:Sensitive (thin, NPO, renal) CBG < 70:implement hypoglycemia protocol CBG 70 - 120:0 units CBG 121 - 150:1 unit CBG 151 - 200:2 units CBG 201 - 250:3 units CBG 251 - 300:5 units CBG 301 - 350:7 units CBG 351 - 4009 units CBG > 400call MD and obtain STAT lab verification     pantoprazole 40 MG tablet  Commonly known as:  PROTONIX  Take 40 mg by mouth 2 (two) times daily.     potassium chloride 10 MEQ CR capsule  Commonly known as:  MICRO-K  Take 10 mEq by mouth daily.     predniSONE 10 MG tablet  Commonly known as:  DELTASONE  5 tablets 5 days, 4 tablets 5 days, 3 tablets 5 days, 2 tablets 5 days, 1 tablet 5 days, half a tablet 5 days then discontinue     tamsulosin 0.4 MG Caps capsule  Commonly known as:  FLOMAX  Take 1 capsule (0.4 mg total) by mouth daily.         Discharge Condition: Stable   Discharge Instructions   Discharge Instructions    Diet - low sodium heart healthy  Complete by:  As directed      Diet - low sodium heart healthy    Complete by:  As directed      Increase activity slowly    Complete by:  As directed      Increase activity slowly    Complete by:  As directed            Allergies  Allergen Reactions  . Valacyclovir Hcl Rash      Disposition: 01-Home or Self Care   Consults: Orthopedics     Significant Diagnostic Studies:  Dg Knee Left Port  01/06/2015  CLINICAL DATA:  Severe pain in both knees with increased swelling EXAM: PORTABLE LEFT KNEE - 1-2 VIEW COMPARISON:  None. FINDINGS: There is a moderate suprapatellar joint effusion. There is no acute fracture or subluxation identified. No radio-opaque foreign body or soft tissue calcification. Moderate patellofemoral and medial compartment osteoarthritis. IMPRESSION: 1. Joint effusion. 2.  Osteoarthritis. Electronically Signed   By: Kerby Moors M.D.   On: 01/06/2015 16:18   Dg Knee Right Port  01/06/2015  CLINICAL DATA:  Severe bilateral knee pain and swelling. No reported injury. EXAM: PORTABLE RIGHT KNEE - 1-2 VIEW COMPARISON:  None. FINDINGS: There is a small suprapatellar right knee joint effusion with associated curvilinear calcifications in the suprapatellar right knee joint space, which could indicate chondrocalcinosis. No fracture, dislocation or suspicious focal osseous lesion. Mild joint space narrowing with tiny marginal osteophytes in the medial compartment. Relatively preserved lateral compartment. Small marginal patellar osteophytes. Prominent vascular calcifications in the posterior soft tissues. Small superior right patellar enthesophyte. IMPRESSION: 1. Small suprapatellar right knee joint effusion. 2. Possible chondrocalcinosis in the right knee suprapatellar joint space. 3. Mild medial and patellofemoral compartment osteoarthritis in the right knee joint. Electronically Signed   By: Ilona Sorrel M.D.   On: 01/06/2015 16:16      Filed Weights   01/06/15 0430  Weight: 69.4 kg (153 lb)     Microbiology: Recent Results (from the past 240 hour(s))  Body fluid culture     Status: None (Preliminary result)   Collection Time: 01/06/15  4:50 PM  Result Value Ref Range Status   Specimen Description KNEE LEFT SYNOVIAL FLUID  Final   Special Requests NONE  Final   Gram Stain   Final    ABUNDANT WBC PRESENT,BOTH PMN AND MONONUCLEAR NO ORGANISMS SEEN    Culture   Final    NO GROWTH 2 DAYS Performed at [facility]    Report Status PENDING  Incomplete  Culture, Urine     Status: None (Preliminary result)   Collection Time: 01/07/15  4:17 PM  Result Value Ref Range Status   Specimen Description URINE, CLEAN CATCH  Final   Special Requests NONE  Final   Culture   Final    TOO YOUNG TO READ Performed at [facility]    Report Status PENDING   Incomplete       Blood Culture    Component Value Date/Time   SDES URINE, CLEAN CATCH 01/07/2015 1617   Deal 01/07/2015 1617   CULT  01/07/2015 1617    TOO YOUNG TO READ Performed at [place] PENDING 01/07/2015 1617      Labs: Results for orders placed or performed during the hospital encounter of 01/05/15 (from the past 48 hour(s))  Glucose, capillary     Status: Abnormal   Collection Time: 01/07/15 12:56 PM  Result Value Ref  Range   Glucose-Capillary 169 (H) 65 - 99 mg/dL  Culture, Urine     Status: None (Preliminary result)   Collection Time: 01/07/15  4:17 PM  Result Value Ref Range   Specimen Description URINE, CLEAN CATCH    Special Requests NONE    Culture      TOO YOUNG TO READ Performed at [facility]    Report Status PENDING   Urinalysis, Routine w reflex microscopic (not at [facility])     Status: Abnormal   Collection Time: 01/07/15  4:17 PM  Result Value Ref Range   Color, Urine YELLOW YELLOW   APPearance CLEAR CLEAR   Specific Gravity, Urine 1.016 1.005 - 1.030   pH 6.0 5.0 - 8.0   Glucose, UA NEGATIVE NEGATIVE mg/dL   Hgb urine dipstick NEGATIVE NEGATIVE   Bilirubin Urine NEGATIVE NEGATIVE   Ketones, ur NEGATIVE NEGATIVE mg/dL   Protein, ur 30 (A) NEGATIVE mg/dL   Nitrite NEGATIVE NEGATIVE   Leukocytes, UA NEGATIVE NEGATIVE  Urine microscopic-add on     Status: Abnormal   Collection Time: 01/07/15  4:17 PM  Result Value Ref Range   Squamous Epithelial / LPF NONE SEEN NONE SEEN    Comment: Please note change in reference range.   WBC, UA 0-5 0 - 5 WBC/hpf    Comment: Please note change in reference range.   RBC / HPF NONE SEEN 0 - 5 RBC/hpf    Comment: Please note change in reference range.   Bacteria, UA RARE (A) NONE SEEN    Comment: Please note change in reference range.   Casts HYALINE CASTS (A) NEGATIVE   Urine-Other MUCOUS PRESENT   Glucose, capillary     Status: Abnormal   Collection Time:  01/07/15  5:07 PM  Result Value Ref Range   Glucose-Capillary 168 (H) 65 - 99 mg/dL  Glucose, capillary     Status: Abnormal   Collection Time: 01/07/15 10:19 PM  Result Value Ref Range   Glucose-Capillary 156 (H) 65 - 99 mg/dL  CBC     Status: Abnormal   Collection Time: 01/08/15  5:20 AM  Result Value Ref Range   WBC 10.0 4.0 - 10.5 K/uL   RBC 4.10 3.87 - 5.11 MIL/uL   Hemoglobin 11.8 (L) 12.0 - 15.0 g/dL   HCT 27.0 (L) 35.0 - 09.3 %   MCV 86.3 78.0 - 100.0 fL   MCH 28.8 26.0 - 34.0 pg   MCHC 33.3 30.0 - 36.0 g/dL   RDW 81.8 29.9 - 37.1 %   Platelets 303 150 - 400 K/uL  Basic metabolic panel     Status: Abnormal   Collection Time: 01/08/15  5:20 AM  Result Value Ref Range   Sodium 137 135 - 145 mmol/L   Potassium 4.2 3.5 - 5.1 mmol/L   Chloride 109 101 - 111 mmol/L   CO2 20 (L) 22 - 32 mmol/L   Glucose, Bld 152 (H) 65 - 99 mg/dL   BUN 36 (H) 6 - 20 mg/dL   Creatinine, Ser 6.96 0.44 - 1.00 mg/dL   Calcium 8.3 (L) 8.9 - 10.3 mg/dL   GFR calc non Af Amer >60 >60 mL/min   GFR calc Af Amer >60 >60 mL/min    Comment: (NOTE) The eGFR has been calculated using the CKD EPI equation. This calculation has not been validated in all clinical situations. eGFR's persistently <60 mL/min signify possible Chronic Kidney Disease.    Anion gap 8 5 - 15  Glucose, capillary     Status: Abnormal   Collection Time: 01/08/15  7:34 AM  Result Value Ref Range   Glucose-Capillary 131 (H) 65 - 99 mg/dL  Glucose, capillary     Status: Abnormal   Collection Time: 01/08/15  1:05 PM  Result Value Ref Range   Glucose-Capillary 125 (H) 65 - 99 mg/dL  Glucose, capillary     Status: Abnormal   Collection Time: 01/08/15  5:49 PM  Result Value Ref Range   Glucose-Capillary 156 (H) 65 - 99 mg/dL  Glucose, capillary     Status: Abnormal   Collection Time: 01/08/15  9:12 PM  Result Value Ref Range   Glucose-Capillary 137 (H) 65 - 99 mg/dL  Glucose, capillary     Status: Abnormal   Collection Time:  01/09/15  8:13 AM  Result Value Ref Range   Glucose-Capillary 113 (H) 65 - 99 mg/dL     Lipid Panel  No results found for: CHOL, TRIG, HDL, CHOLHDL, VLDL, LDLCALC, LDLDIRECT   Lab Results  Component Value Date   HGBA1C 6.2* 01/07/2015     Lab Results  Component Value Date   CREATININE 0.87 01/08/2015     HPI :[name] is a 80 y.o. female with a history of HTN, DM2, COPD, Hyperlipidemia and Pseudogout who presents to the ED with complaints of severe 10/10 pain in both knees with increased swelling. She has not been able to walk due to her pain. She denies any fevers or chills. She reports that in the past her PCP would draw off the fluid on her knees. She was administered IV solumedrol, and IV Morphine for pain and referred for admission.   HOSPITAL COURSE:    Bilateral Knee pain, Effusion, Pseudogout; Received IV solumedrol. Now transition to prednisone 50 mg a day, continue slow prednisone taper  Ortho consulted, evaluation for arthrocentesis. Had arthrocentesis 11-19, fluids consistent with CPPD.  No growth from synovial fluid Low dose Vicodin, monitor for oversedation.   Encephalopathy Patient was noted to be lethargic secondary to IV Dilaudid in the hospital Continue Vicodin judiciously for pain control ABG with no hypercarbia.     Urine retention;  UA negative for infection Started on flomax. Patient has an indwelling Foley catheter Patient's Foley catheter can be discontinued in the next 2-3 days at SNF  Essential hypertension; continue Norvasc  Diabetes type 2: Continue SSI. While patient is on steroids at the nursing home  COPD; Albuterol PRN.   Hyperlipidemia: Continue with statin.   Code Status: Full Code.   Discharge Exam:    Blood pressure 147/74, pulse 86, temperature 98.5 F (36.9 C), temperature source Oral, resp. rate 15, height $RemoveBe'5\' 6"'[id number]$  (1.676 m), weight 69.4 kg (153 lb), SpO2 92 %.   General: Alert in no acute  distress  Cardiovascular: S 1, S 2 RRR  Respiratory: CTA  Abdomen: BS present, soft   Musculoskeletal: Knee swelling more on the left    Follow-up Information    Follow up with [name], MD. Schedule an appointment as soon as possible for a visit in 3 days.   Specialty:  Internal Medicine   Contact information:   [address] [place] [address] [phone]       Follow up with [facility].   Specialty:  Skilled Nursing Facility   Contact information:   [place] [phone]      Follow up with [name], MD. Schedule an appointment as  soon as possible for a visit in 3 days.   Specialty:  Internal Medicine   Contact information:   [place] [address] [phone]       Signed: Reyne Dumas 01/09/2015, 9:03 AM        Time spent >45 mins

## 2015-01-09 NOTE — Progress Notes (Signed)
Physical Therapy Treatment Patient Details Name: [name] MRN: [id number] DOB: 02/08/1935 Today's Date: 01/09/2015    History of Present Illness [name] is a 80 y.o. female with a history of HTN, DM2, COPD, Hyperlipidemia and Pseudogout who presents to the ED with complaints of severe 10/10 pain in both knees with increased swelling. She has not been able to walk due to her pain. She denies any fevers or chills. She reports that in the past her PCP would draw off the fluid on her knees. She was administered IV solumedrol, and IV Morphine for pain and referred for admission.     PT Comments    Pt. Presents with limited mobility secondary to pain; modified independence with increased time, HOB elevated and use of bed rails to bring trunk upright and scoot to EOB; intermittent min assist for navigation of LLE with bed mobility. Patient requires min-mod assist x2 therapists bilaterally and RW for STS and transfer to recliner. Decreased safety awareness. Very antalgic with decreased weight shift and stance time on L, decreased stride length, and limited activity due to patient c/o severe pain. Pt. Denied ambulation d/t pain.  HIGH FALL RISK.    Follow Up Recommendations  SNF;Supervision/Assistance - 24 hour     Equipment Recommendations  None recommended by PT    Recommendations for Other Services       Precautions / Restrictions Restrictions Weight Bearing Restrictions: No    Mobility  Bed Mobility Overal bed mobility: Needs Assistance;Modified Independent Bed Mobility: Supine to Sit     Supine to sit: Modified independent (Device/Increase time);Min assist;HOB elevated     General bed mobility comments: pt. requires extra time, HOB elevated and use of bed rails to bring trunk upright and body to EOB  Transfers Overall transfer level: Needs assistance Equipment used: 2 person hand held assist;Rolling walker (2 wheeled) Transfers: Sit to/from  Stand Sit to Stand: Min assist;Mod assist;+2 safety/equipment;+2 physical assistance;From elevated surface         General transfer comment: pt. requires/requests min-mod assist x2 therapist bilaterally with RW and elevated surface; takes small antalgic steps to get to recliner   Ambulation/Gait             General Gait Details: not attempted d/t patient intolerance and c/o of increased pain   Stairs            Wheelchair Mobility    Modified Rankin (Stroke Patients Only)       Balance                                    Cognition Arousal/Alertness: Awake/alert Behavior During Therapy: WFL for tasks assessed/performed Overall Cognitive Status: Within Functional Limits for tasks assessed                      Exercises      General Comments        Pertinent Vitals/Pain Pain Assessment: 0-10 Pain Score: 6  (started at 5-6, after tx 8/10) Pain Location: L knee Pain Descriptors / Indicators: Aching;Grimacing;Moaning;Guarding Pain Intervention(s): Limited activity within patient's tolerance;Monitored during session;Premedicated before session;RN gave pain meds during session;Patient requesting pain meds-RN notified;Ice applied;Repositioned    Home Living                      Prior Function  PT Goals (current goals can now be found in the care plan section) Acute Rehab PT Goals PT Goal Formulation: With patient Time For Goal Achievement: 01/22/15 Potential to Achieve Goals: Good Progress towards PT goals: Progressing toward goals    Frequency  Min 3X/week    PT Plan      Co-evaluation             End of Session Equipment Utilized During Treatment: Gait belt Activity Tolerance: Patient limited by pain Patient left: in chair;with call bell/phone within reach;with chair alarm set     Time: [id number] PT Time Calculation (min) (ACUTE ONLY): 25 min  Charges:  $Gait Training: 8-22 mins $Therapeutic  Activity: 8-22 mins                    G CodesDenna Haggard, SPTA   01/09/2015 9:54 AM   Pager: [phone]   Reviewed and agree with above Rica Koyanagi  PTA [facility]  Acute  Rehab Pager      [phone]

## 2015-01-09 NOTE — Progress Notes (Signed)
Report called to [facility] [address] at [facility].

## 2015-01-10 ENCOUNTER — Non-Acute Institutional Stay (SKILLED_NURSING_FACILITY): Payer: Medicare PPO | Admitting: Internal Medicine

## 2015-01-10 ENCOUNTER — Other Ambulatory Visit: Payer: Self-pay

## 2015-01-10 DIAGNOSIS — K219 Gastro-esophageal reflux disease without esophagitis: Secondary | ICD-10-CM | POA: Diagnosis not present

## 2015-01-10 DIAGNOSIS — K5900 Constipation, unspecified: Secondary | ICD-10-CM | POA: Diagnosis not present

## 2015-01-10 DIAGNOSIS — R339 Retention of urine, unspecified: Secondary | ICD-10-CM

## 2015-01-10 DIAGNOSIS — M11862 Other specified crystal arthropathies, left knee: Secondary | ICD-10-CM | POA: Diagnosis not present

## 2015-01-10 DIAGNOSIS — J309 Allergic rhinitis, unspecified: Secondary | ICD-10-CM | POA: Diagnosis not present

## 2015-01-10 DIAGNOSIS — M11262 Other chondrocalcinosis, left knee: Secondary | ICD-10-CM

## 2015-01-10 DIAGNOSIS — I10 Essential (primary) hypertension: Secondary | ICD-10-CM | POA: Diagnosis not present

## 2015-01-10 DIAGNOSIS — R531 Weakness: Secondary | ICD-10-CM | POA: Diagnosis not present

## 2015-01-10 DIAGNOSIS — J449 Chronic obstructive pulmonary disease, unspecified: Secondary | ICD-10-CM | POA: Diagnosis not present

## 2015-01-10 DIAGNOSIS — E785 Hyperlipidemia, unspecified: Secondary | ICD-10-CM

## 2015-01-10 DIAGNOSIS — E119 Type 2 diabetes mellitus without complications: Secondary | ICD-10-CM | POA: Diagnosis not present

## 2015-01-10 LAB — URINE CULTURE: Culture: 30000

## 2015-01-10 MED ORDER — HYDROCODONE-ACETAMINOPHEN 5-325 MG PO TABS: 1.0000 | ORAL_TABLET | ORAL | Status: DC | PRN

## 2015-01-10 NOTE — Progress Notes (Signed)
Patient ID: [name], female   DOB: 1934-09-30, 80 y.o.   MRN: [id number]     [facility]   PCP: [name], MD  Code Status: full code  Allergies  Allergen Reactions  . Valacyclovir Hcl Rash    Chief Complaint  Patient presents with  . New Admit To SNF     HPI:  80 y.o. patient is here for short term rehabilitation post hospital admission from 01/05/15-01/09/15 with left knee pain and effusion. She underwent arthrocentesis and was diagnosed to have pseudogout. She was treated with iv solumedrol and then po prednisone. She had urinary retention and required foley catheter and was started on flomax. She is seen in her room today. Her pain is under control with current pain regimen. She has been having good urine output in the foley bag. She has chronic allergic rhinitis and was taking allegra at home and would like this resumed. Denies any other concerns. On chart review, has elevated BP reading in the facility. Of note, per patient her allegra had been discontinued in the hospital for low bp reading.   Review of Systems:  Constitutional: Negative for fever, chills, diaphoresis.  HENT: Negative for headache, congestion, nasal discharge. Has dry nose. Eyes: Negative for eye pain, blurred vision, double vision and discharge.  Respiratory: Negative for cough, shortness of breath and wheezing.   Cardiovascular: Negative for chest pain, palpitations, leg swelling.  Gastrointestinal: Negative for heartburn, nausea, vomiting, abdominal pain. Has been constipated and has not had a bowel movement for a week. Passing flatus. At home, had a bowel movement q3 days. Genitourinary: Negative for flank pain.  Musculoskeletal: Negative for back pain, falls. Skin: Negative for itching, rash.  Neurological: Negative for dizziness, tingling, focal weakness Psychiatric/Behavioral: Negative for depression.   Past Medical History  Diagnosis Date  .  Diabetes mellitus   . Hyperlipidemia   . Hypertension   . Sleep apnea     AHI 89/hr  . Allergic rhinitis   . COPD (chronic obstructive pulmonary disease) ([place])     PFT 08/08/09-FEV1 1.98/107; R 0.77; small airway obst w/tresp to dilator;DLCO 44%  . Fibromuscular dysplasia (H[facility])     RAS  . Complication of anesthesia     Hard time waking up  . PONV (postoperative nausea and vomiting)   . Arthritis   . Pseudogout    Past Surgical History  Procedure Laterality Date  . Pituitary tumor removed      x 2  . Back surgery  2005  . Tonsillectomy    . Nasal septal deviation    . Bilateral renal bypass    . Esophagogastroduodenoscopy N/A 10/01/2012    Procedure: ESOPHAGOGASTRODUODENOSCOPY (EGD);  Surgeon: Missy Sabins, MD;  Location: [facility] ENDOSCOPY;  Service: Endoscopy;  Laterality: N/A;   Social History:   reports that she quit smoking about 17 years ago. Her smoking use included Cigarettes. She has a 35 pack-year smoking history. She has never used smokeless tobacco. She reports that she does not drink alcohol or use illicit drugs.  Family History  Problem Relation Age of Onset  . Stroke Father   . Breast cancer Mother     Medications:   Medication List       This list is accurate as of: 01/10/15  8:11 PM.  Always use your most recent med list.               albuterol (2.5 MG/3ML) 0.083% nebulizer solution  Commonly known as:  PROVENTIL  Take 3 mLs (2.5 mg total) by nebulization every 6 (six) hours as needed for wheezing or shortness of breath.     alum & mag hydroxide-simeth 200-200-20 MG/5ML suspension  Commonly known as:  MAALOX/MYLANTA  Take 30 mLs by mouth every 6 (six) hours as needed for indigestion or heartburn (dyspepsia).     amLODipine 10 MG tablet  Commonly known as:  NORVASC  Take 15 mg by mouth daily.     atorvastatin 80 MG tablet  Commonly known as:  LIPITOR  Take 80 mg by mouth daily.     BOOST GLUCOSE CONTROL Liqd  Take 237 mLs by mouth daily.      fluticasone 50 MCG/ACT nasal spray  Commonly known as:  FLONASE  Place 2 sprays into both nostrils at bedtime.     FLUZONE HIGH-DOSE 0.5 ML Susy  Generic drug:  Influenza Vac Split High-Dose  ADM 0.5ML IM UTD     gabapentin 600 MG tablet  Commonly known as:  NEURONTIN  Take 600 mg by mouth daily.     HYDROcodone-acetaminophen 5-325 MG tablet  Commonly known as:  NORCO/VICODIN  Take 1 tablet by mouth every 4 (four) hours as needed for severe pain.     insulin aspart 100 UNIT/ML injection  Commonly known as:  NOVOLOG  3 times a day before meals  QuestionAnswerComment Correction coverage:Sensitive (thin, NPO, renal) CBG < 70:implement hypoglycemia protocol CBG 70 - 120:0 units CBG 121 - 150:1 unit CBG 151 - 200:2 units CBG 201 - 250:3 units CBG 251 - 300:5 units CBG 301 - 350:7 units CBG 351 - 4009 units CBG > 400call MD and obtain STAT lab verification     pantoprazole 40 MG tablet  Commonly known as:  PROTONIX  Take 40 mg by mouth 2 (two) times daily.     potassium chloride 10 MEQ CR capsule  Commonly known as:  MICRO-K  Take 10 mEq by mouth daily.     predniSONE 10 MG tablet  Commonly known as:  DELTASONE  5 tablets 5 days, 4 tablets 5 days, 3 tablets 5 days, 2 tablets 5 days, 1 tablet 5 days, half a tablet 5 days then discontinue     tamsulosin 0.4 MG Caps capsule  Commonly known as:  FLOMAX  Take 1 capsule (0.4 mg total) by mouth daily.         Physical Exam: Filed Vitals:   01/10/15 2009  BP: 155/92  Pulse: 89  Temp: 97.2 F (36.2 C)  Resp: 18  SpO2: 94%    General- elderly female, well built, in no acute distress Head- normocephalic, atraumatic Nose- normal nasal mucosa, no maxillary or frontal sinus tenderness, no nasal discharge Throat- moist mucus membrane  Eyes- PERRLA, EOMI, no pallor, no icterus, no discharge, normal conjunctiva, normal sclera Neck- no cervical lymphadenopathy Cardiovascular- normal s1,s2, no murmurs,  palpable dorsalis pedis and radial pulses, no leg edema Respiratory- bilateral clear to auscultation, no wheeze, no rhonchi, no crackles, no use of accessory muscles Abdomen- bowel sounds present, soft, non tender, has foley catheter + Musculoskeletal- able to move all 4 extremities, generalized weakness, left knee mild swelling present, no erythema or warmth noted Neurological- no focal deficit, alert and oriented to person, place and time Skin- warm and dry Psychiatry- normal mood and affect    Labs reviewed: Basic Metabolic Panel:  Recent Labs  01/06/15 0600 01/07/15 0602 01/08/15 0520  NA 139 140 137  K 3.6 3.9 4.2  CL 106 108 109  CO2 22 23 20*  GLUCOSE 146* 157* 152*  BUN 15 38* 36*  CREATININE 0.71 1.14* 0.87  CALCIUM 9.1 8.7* 8.3*   Liver Function Tests:  Recent Labs  02/14/14 0057  AST 18  ALT 11  ALKPHOS 86  BILITOT 1.4*  PROT 7.6  ALBUMIN 4.2   No results for input(s): LIPASE, AMYLASE in the last 8760 hours. No results for input(s): AMMONIA in the last 8760 hours. CBC:  Recent Labs  02/14/14 0057 01/06/15 0014 01/06/15 0600 01/08/15 0520  WBC 7.4 11.2* 10.9* 10.0  NEUTROABS 5.6 8.9*  --   --   HGB 12.7 14.1 13.3 11.8*  HCT 40.1 42.5 40.7 35.4*  MCV 88.3 85.7 87.2 86.3  PLT 294 294 288 303   Cardiac Enzymes:  Recent Labs  01/06/15 0014  CKTOTAL 24*   BNP: Invalid input(s): POCBNP CBG:  Recent Labs  01/08/15 2112 01/09/15 0813 01/09/15 1153  GLUCAP 137* 113* 124*    Radiological Exams: Dg Knee Left Port  01/06/2015  CLINICAL DATA:  Severe pain in both knees with increased swelling EXAM: PORTABLE LEFT KNEE - 1-2 VIEW COMPARISON:  None. FINDINGS: There is a moderate suprapatellar joint effusion. There is no acute fracture or subluxation identified. No radio-opaque foreign body or soft tissue calcification. Moderate patellofemoral and medial compartment osteoarthritis. IMPRESSION: 1. Joint effusion. 2. Osteoarthritis. Electronically  Signed   By: Kerby Moors M.D.   On: 01/06/2015 16:18   Dg Knee Right Port  01/06/2015  CLINICAL DATA:  Severe bilateral knee pain and swelling. No reported injury. EXAM: PORTABLE RIGHT KNEE - 1-2 VIEW COMPARISON:  None. FINDINGS: There is a small suprapatellar right knee joint effusion with associated curvilinear calcifications in the suprapatellar right knee joint space, which could indicate chondrocalcinosis. No fracture, dislocation or suspicious focal osseous lesion. Mild joint space narrowing with tiny marginal osteophytes in the medial compartment. Relatively preserved lateral compartment. Small marginal patellar osteophytes. Prominent vascular calcifications in the posterior soft tissues. Small superior right patellar enthesophyte. IMPRESSION: 1. Small suprapatellar right knee joint effusion. 2. Possible chondrocalcinosis in the right knee suprapatellar joint space. 3. Mild medial and patellofemoral compartment osteoarthritis in the right knee joint. Electronically Signed   By: Ilona Sorrel M.D.   On: 01/06/2015 16:16     Assessment/Plan  generalized weakness Will have patient work with PT/OT as tolerated to regain strength and restore function.  Fall precautions are in place.  Pseudogout Continue prednisone 50 mg daily x 5 days with tapering to 40, 30, 20, 10,5 and 2.5 mg every 5 days and then discontinue. continue norco 5-325 mg q4h prn for moderate to severe pain and add tylenol 650 mg q6h prn for mild pain monitor for signs fo flare up. Will have her work with physical therapy and occupational therapy team to help with gait training and muscle strengthening exercises.fall precautions. Skin care. Encourage to be out of bed. Continue gabapentin current regimen- no changes made  Urinary retention Will do voiding trial for now and monitor urine output. If needs another placement of foley for not being able to void, will get urology referral. Continue flomax for now  HTN Elevated bp,  currently on amlodipine 20 mg daily. Change this to 10 mg daily and start losartan 25 mg daily, check bp q shift and reassess  Allergic rhinitis Start allegra 180 mg daily for now and monitor. Continue flonase. Discontinue claritin per patient request  DM Monitor cbg and continue novolog premeal  SSI. Lab Results  Component Value Date   HGBA1C 6.2* 01/07/2015     Copd Breathing stable, monitor breathing, continue prn proventil  HLD Continue lipitor 80 mg daily  Gerd Stable, continue protonix 40 mg bid for now  Constipation Add colace 100 mg bid only as needed per patient request, change to scheduled if remains constipated.    Goals of care: short term rehabilitation   Labs/tests ordered: cbc with diff, cmp  Family/ staff Communication: reviewed care plan with patient and nursing supervisor    Blanchie Serve, MD  [facility] [phone] (Monday-Friday 8 am - 5 pm) [phone] (afterhours)

## 2015-01-22 ENCOUNTER — Encounter: Payer: Self-pay | Admitting: Adult Health

## 2015-01-22 ENCOUNTER — Non-Acute Institutional Stay (SKILLED_NURSING_FACILITY): Payer: Medicare PPO | Admitting: Adult Health

## 2015-01-22 DIAGNOSIS — E43 Unspecified severe protein-calorie malnutrition: Secondary | ICD-10-CM

## 2015-01-22 DIAGNOSIS — I10 Essential (primary) hypertension: Secondary | ICD-10-CM

## 2015-01-22 DIAGNOSIS — R339 Retention of urine, unspecified: Secondary | ICD-10-CM | POA: Diagnosis not present

## 2015-01-22 DIAGNOSIS — J309 Allergic rhinitis, unspecified: Secondary | ICD-10-CM

## 2015-01-22 DIAGNOSIS — J449 Chronic obstructive pulmonary disease, unspecified: Secondary | ICD-10-CM

## 2015-01-22 DIAGNOSIS — K5900 Constipation, unspecified: Secondary | ICD-10-CM

## 2015-01-22 DIAGNOSIS — E785 Hyperlipidemia, unspecified: Secondary | ICD-10-CM

## 2015-01-22 DIAGNOSIS — K219 Gastro-esophageal reflux disease without esophagitis: Secondary | ICD-10-CM

## 2015-01-22 DIAGNOSIS — M11862 Other specified crystal arthropathies, left knee: Secondary | ICD-10-CM | POA: Diagnosis not present

## 2015-01-22 DIAGNOSIS — G629 Polyneuropathy, unspecified: Secondary | ICD-10-CM | POA: Diagnosis not present

## 2015-01-22 DIAGNOSIS — E119 Type 2 diabetes mellitus without complications: Secondary | ICD-10-CM

## 2015-01-22 DIAGNOSIS — R531 Weakness: Secondary | ICD-10-CM | POA: Diagnosis not present

## 2015-01-22 DIAGNOSIS — M11262 Other chondrocalcinosis, left knee: Secondary | ICD-10-CM

## 2015-01-24 ENCOUNTER — Other Ambulatory Visit: Payer: Self-pay | Admitting: Adult Health

## 2015-01-25 DIAGNOSIS — Z87891 Personal history of nicotine dependence: Secondary | ICD-10-CM | POA: Diagnosis not present

## 2015-01-25 DIAGNOSIS — J449 Chronic obstructive pulmonary disease, unspecified: Secondary | ICD-10-CM | POA: Diagnosis not present

## 2015-01-25 DIAGNOSIS — M6281 Muscle weakness (generalized): Secondary | ICD-10-CM | POA: Diagnosis not present

## 2015-01-25 DIAGNOSIS — E119 Type 2 diabetes mellitus without complications: Secondary | ICD-10-CM | POA: Diagnosis not present

## 2015-01-25 DIAGNOSIS — I10 Essential (primary) hypertension: Secondary | ICD-10-CM | POA: Diagnosis not present

## 2015-01-26 DIAGNOSIS — M6281 Muscle weakness (generalized): Secondary | ICD-10-CM | POA: Diagnosis not present

## 2015-01-26 DIAGNOSIS — Z87891 Personal history of nicotine dependence: Secondary | ICD-10-CM | POA: Diagnosis not present

## 2015-01-26 DIAGNOSIS — J449 Chronic obstructive pulmonary disease, unspecified: Secondary | ICD-10-CM | POA: Diagnosis not present

## 2015-01-26 DIAGNOSIS — I10 Essential (primary) hypertension: Secondary | ICD-10-CM | POA: Diagnosis not present

## 2015-01-26 DIAGNOSIS — E119 Type 2 diabetes mellitus without complications: Secondary | ICD-10-CM | POA: Diagnosis not present

## 2015-01-30 DIAGNOSIS — E119 Type 2 diabetes mellitus without complications: Secondary | ICD-10-CM | POA: Diagnosis not present

## 2015-01-30 DIAGNOSIS — Z87891 Personal history of nicotine dependence: Secondary | ICD-10-CM | POA: Diagnosis not present

## 2015-01-30 DIAGNOSIS — I10 Essential (primary) hypertension: Secondary | ICD-10-CM | POA: Diagnosis not present

## 2015-01-30 DIAGNOSIS — J449 Chronic obstructive pulmonary disease, unspecified: Secondary | ICD-10-CM | POA: Diagnosis not present

## 2015-01-30 DIAGNOSIS — M6281 Muscle weakness (generalized): Secondary | ICD-10-CM | POA: Diagnosis not present

## 2015-02-01 DIAGNOSIS — J449 Chronic obstructive pulmonary disease, unspecified: Secondary | ICD-10-CM | POA: Diagnosis not present

## 2015-02-01 DIAGNOSIS — E119 Type 2 diabetes mellitus without complications: Secondary | ICD-10-CM | POA: Diagnosis not present

## 2015-02-01 DIAGNOSIS — Z87891 Personal history of nicotine dependence: Secondary | ICD-10-CM | POA: Diagnosis not present

## 2015-02-01 DIAGNOSIS — I10 Essential (primary) hypertension: Secondary | ICD-10-CM | POA: Diagnosis not present

## 2015-02-01 DIAGNOSIS — M6281 Muscle weakness (generalized): Secondary | ICD-10-CM | POA: Diagnosis not present

## 2015-02-05 DIAGNOSIS — G4733 Obstructive sleep apnea (adult) (pediatric): Secondary | ICD-10-CM | POA: Diagnosis not present

## 2015-02-05 DIAGNOSIS — I509 Heart failure, unspecified: Secondary | ICD-10-CM | POA: Diagnosis not present

## 2015-02-05 NOTE — Progress Notes (Signed)
Patient ID: [name], female   DOB: [birth year], 80 y.o.   MRN: [id number]    DATE:  01/22/15  MRN:  [id number]  BIRTHDAY: [birth year]  Facility:  [facility] Location:  [place] Room Number: 1202-P  LEVEL OF CARE:  SNF (31)  Contact Information    Name Relation Home Work Mobile   Breanna, Castillo Daughter [phone]         Chief Complaint  Patient presents with  . Discharge Note    Generalized weakness, pseudogout urinary retention, hypertension, diabetes mellitus type  2, COPD, hyperlipidemia, UTI, protein calorie malnutrition, constipation, allergic rhinitis, neuropathy and GERD    HISTORY OF PRESENT ILLNESS:  This is a 80 year old female who is for discharge home with PT, OT and home health aide.She has PMH of hypertension, diabetes mellitus type II,COPD, hyperlipidemia and pseudogout.She has been admitted to [facility] on 01/09/15 from [facility] was having pain in both knees with increased swelling. She was diagnosed with pseudogout.She was given IV Solu-Medrol and transition to prednisone 50 mg daily then tapered off. She had arthrocentesis on 11/19 and fluids was consistent with CPPD.  Patient was admitted to this facility for short-term rehabilitation after the patient's recent hospitalization.  Patient has completed SNF rehabilitation and therapy has cleared the patient for discharge.  PAST MEDICAL HISTORY:  Past Medical History  Diagnosis Date  . Diabetes mellitus   . Hyperlipidemia   . Hypertension   . Sleep apnea     AHI 89/hr  . Allergic rhinitis   . COPD (chronic obstructive pulmonary disease) ([place])     PFT 08/08/09-FEV1 1.98/107; R 0.77; small airway obst w/tresp to dilator;DLCO 44%  . Fibromuscular dysplasia ([facility])     RAS  . Complication of anesthesia     Hard time waking up  . PONV (postoperative nausea and vomiting)   . Arthritis   . Pseudogout      CURRENT MEDICATIONS: Reviewed    Patient's Medications  New Prescriptions   No medications on file  Previous Medications   ALBUTEROL (PROVENTIL) (2.5 MG/3ML) 0.083% NEBULIZER SOLUTION    Take 3 mLs (2.5 mg total) by nebulization every 6 (six) hours as needed for wheezing or shortness of breath.   ALUM & MAG HYDROXIDE-SIMETH (MAALOX/MYLANTA) 200-200-20 MG/5ML SUSPENSION    Take 30 mLs by mouth every 6 (six) hours as needed for indigestion or heartburn (dyspepsia).   AMLODIPINE (NORVASC) 10 MG TABLET    Take 15 mg by mouth daily.     ATORVASTATIN (LIPITOR) 80 MG TABLET    Take 80 mg by mouth daily.     FLUTICASONE (FLONASE) 50 MCG/ACT NASAL SPRAY    Place 2 sprays into both nostrils at bedtime.   FLUZONE HIGH-DOSE 0.5 ML SUSY    ADM 0.5ML IM UTD   GABAPENTIN (NEURONTIN) 600 MG TABLET    Take 600 mg by mouth daily.     HYDROCODONE-ACETAMINOPHEN (NORCO/VICODIN) 5-325 MG TABLET    Take 1 tablet by mouth every 4 (four) hours as needed for severe pain.   INSULIN ASPART (NOVOLOG) 100 UNIT/ML INJECTION    3 times a day before meals  Question Answer Comment   Correction coverage: Sensitive (thin, NPO, renal)    CBG < 70: implement hypoglycemia protocol    CBG 70 - 120: 0 units    CBG 121 - 150: 1 unit    CBG 151 - 200: 2 units    CBG 201 -  250: 3 units    CBG 251 - 300: 5 units    CBG 301 - 350: 7 units    CBG 351 - 400 9 units    CBG > 400 call MD and obtain STAT lab verification   NUTRITIONAL SUPPLEMENTS (BOOST GLUCOSE CONTROL) LIQD    Take 237 mLs by mouth daily.   PANTOPRAZOLE (PROTONIX) 40 MG TABLET    Take 40 mg by mouth 2 (two) times daily.  Modified Medications   No medications on file  Discontinued Medications   POTASSIUM CHLORIDE (MICRO-K) 10 MEQ CR CAPSULE    Take 10 mEq by mouth daily.   PREDNISONE (DELTASONE) 10 MG TABLET    5 tablets 5 days, 4 tablets 5 days, 3 tablets 5 days, 2 tablets 5 days, 1 tablet 5 days, half a tablet 5 days then discontinue   TAMSULOSIN (FLOMAX) 0.4 MG CAPS  CAPSULE    Take 1 capsule (0.4 mg total) by mouth daily.     Allergies  Allergen Reactions  . Valacyclovir Hcl Rash     REVIEW OF SYSTEMS:  GENERAL: no change in appetite, no fatigue, no weight changes, no fever, chills or weakness EYES: Denies change in vision, dry eyes, eye pain, itching or discharge EARS: Denies change in hearing, ringing in ears, or earache NOSE: Denies nasal congestion or epistaxis MOUTH and THROAT: Denies oral discomfort, gingival pain or bleeding, pain from teeth or hoarseness   RESPIRATORY: no cough, SOB, DOE, wheezing, hemoptysis CARDIAC: no chest pain, edema or palpitations GI: no abdominal pain, diarrhea, constipation, heart burn, nausea or vomiting GU: Denies dysuria, frequency, hematuria, incontinence, or discharge PSYCHIATRIC: Denies feeling of depression or anxiety. No report of hallucinations, insomnia, paranoia, or agitation   PHYSICAL EXAMINATION  GENERAL APPEARANCE: Well nourished. In no acute distress. Normal body habitus HEAD: Normal in size and contour. No evidence of trauma EYES: Lids open and close normally. No blepharitis, entropion or ectropion. PERRL. Conjunctivae are clear and sclerae are white. Lenses are without opacity EARS: Pinnae are normal. Patient hears normal voice tunes of the examiner MOUTH and THROAT: Lips are without lesions. Oral mucosa is moist and without lesions. Tongue is normal in shape, size, and color and without lesions NECK: supple, trachea midline, no neck masses, no thyroid tenderness, no thyromegaly LYMPHATICS: no LAN in the neck, no supraclavicular LAN RESPIRATORY: breathing is even & unlabored, BS CTAB CARDIAC: RRR, no murmur,no extra heart sounds, no edema GI: abdomen soft, normal BS, no masses, no tenderness, no hepatomegaly, no splenomegaly PSYCHIATRIC: Alert and oriented X 3. Affect and behavior are appropriate  LABS/RADIOLOGY: Labs reviewed: 01/18/15  Urine culture > 100,000 CFU/mL Proteus  mirabilis 01/15/15  WBC 14.4 hemoglobin 10.8 hem 385 sodium 143 potassium 3.7 glucose [id number] creatinine 0.98 calcium 8.0 albumin 2.99  Total protein 4.9 alkaline phosphatase 88 SGOT 12 SGPT 26 Basic Metabolic Panel:  Recent Labs  01/06/15 0600 01/07/15 0602 01/08/15 0520  NA 139 140 137  K 3.6 3.9 4.2  CL 106 108 109  CO2 22 23 20*  GLUCOSE 146* 157* 152*  BUN 15 38* 36*  CREATININE 0.71 1.14* 0.87  CALCIUM 9.1 8.7* 8.3*   Liver Function Tests:  Recent Labs  02/14/14 0057  AST 18  ALT 11  ALKPHOS 86  BILITOT 1.4*  PROT 7.6  ALBUMIN 4.2   CBC:  Recent Labs  02/14/14 0057 01/06/15 0014 01/06/15 0600 01/08/15 0520  WBC 7.4 11.2* 10.9* 10.0  NEUTROABS 5.6 8.9*  --   --  HGB 12.7 14.1 13.3 11.8*  HCT 40.1 42.5 40.7 35.4*  MCV 88.3 85.7 87.2 86.3  PLT 294 294 288 303   Cardiac Enzymes:  Recent Labs  01/06/15 0014  CKTOTAL 24*   CBG:  Recent Labs  01/08/15 2112 01/09/15 0813 01/09/15 1153  GLUCAP 137* 113* 124*     ASSESSMENT/PLAN:  Generalized weakness - for home health PT, OT and Home health aide  Pseudogout -  S/P arthrocentesis; Prednisone was tapered off, continue with Tylenol  325 mg 2 tabs = 650 mg by mouth every 6 hours when necessary and Norco 5/325 mg 1 tab by mouth every 4 Hours when necessary  Urinary retention - resolved; Flomax requested to be  Discontinued per patient  Essential hypertension - continue Norvasc 10 mg 1 tab by mouth daily and losartan 25 mg by mouth  Diabetes mellitus, type  _ HEMOGLOBIN A1c 6.2; SSI Humalog pre-meal  COPD - continue albuterol when necessary  Hyperlipidemia - continue Lipitor 80 mg 1 tab by mouth daily and  UTI - start Bactrim DS 1 tab  By mouth twice a day 7 days and Florastor 250 mg PO BID X 10 days  Protein calorie malnutrition, severe - albumin 2.99; continue Procel 2 scoops by mouth twice a day  Constipation - continue Colace 100 mg by mouth twice   Allergic rhinitis - continue Allegra  180 mg 1 tab PO daily and Fluticasone 50 mcg 2 sprays into both nostrils Q HS  GERD - continue Protonix 40 mg PO BID  Neuropathy - continue Neurontin 600 mg PO daily     I have filled out patient's discharge paperwork and written prescriptions.  Patient will receive home health PT, OT and Home health aide.  Total discharge time: Greater than 30 minutes  Discharge time involved coordination of the discharge process with social worker, nursing staff and therapy department. Medical justification for home health services verified.     [facility], NP [employer] [phone]

## 2015-02-06 DIAGNOSIS — J449 Chronic obstructive pulmonary disease, unspecified: Secondary | ICD-10-CM | POA: Diagnosis not present

## 2015-02-06 DIAGNOSIS — E1149 Type 2 diabetes mellitus with other diabetic neurological complication: Secondary | ICD-10-CM | POA: Diagnosis not present

## 2015-02-06 DIAGNOSIS — E038 Other specified hypothyroidism: Secondary | ICD-10-CM | POA: Diagnosis not present

## 2015-02-06 DIAGNOSIS — M6281 Muscle weakness (generalized): Secondary | ICD-10-CM | POA: Diagnosis not present

## 2015-02-06 DIAGNOSIS — I10 Essential (primary) hypertension: Secondary | ICD-10-CM | POA: Diagnosis not present

## 2015-02-06 DIAGNOSIS — E119 Type 2 diabetes mellitus without complications: Secondary | ICD-10-CM | POA: Diagnosis not present

## 2015-02-06 DIAGNOSIS — Z87891 Personal history of nicotine dependence: Secondary | ICD-10-CM | POA: Diagnosis not present

## 2015-02-08 DIAGNOSIS — J449 Chronic obstructive pulmonary disease, unspecified: Secondary | ICD-10-CM | POA: Diagnosis not present

## 2015-02-08 DIAGNOSIS — E119 Type 2 diabetes mellitus without complications: Secondary | ICD-10-CM | POA: Diagnosis not present

## 2015-02-08 DIAGNOSIS — I10 Essential (primary) hypertension: Secondary | ICD-10-CM | POA: Diagnosis not present

## 2015-02-08 DIAGNOSIS — M6281 Muscle weakness (generalized): Secondary | ICD-10-CM | POA: Diagnosis not present

## 2015-02-08 DIAGNOSIS — Z87891 Personal history of nicotine dependence: Secondary | ICD-10-CM | POA: Diagnosis not present

## 2015-02-13 DIAGNOSIS — I10 Essential (primary) hypertension: Secondary | ICD-10-CM | POA: Diagnosis not present

## 2015-02-13 DIAGNOSIS — J449 Chronic obstructive pulmonary disease, unspecified: Secondary | ICD-10-CM | POA: Diagnosis not present

## 2015-02-13 DIAGNOSIS — Z87891 Personal history of nicotine dependence: Secondary | ICD-10-CM | POA: Diagnosis not present

## 2015-02-13 DIAGNOSIS — E119 Type 2 diabetes mellitus without complications: Secondary | ICD-10-CM | POA: Diagnosis not present

## 2015-02-13 DIAGNOSIS — M6281 Muscle weakness (generalized): Secondary | ICD-10-CM | POA: Diagnosis not present

## 2015-02-15 DIAGNOSIS — Z87891 Personal history of nicotine dependence: Secondary | ICD-10-CM | POA: Diagnosis not present

## 2015-02-15 DIAGNOSIS — I10 Essential (primary) hypertension: Secondary | ICD-10-CM | POA: Diagnosis not present

## 2015-02-15 DIAGNOSIS — J449 Chronic obstructive pulmonary disease, unspecified: Secondary | ICD-10-CM | POA: Diagnosis not present

## 2015-02-15 DIAGNOSIS — M6281 Muscle weakness (generalized): Secondary | ICD-10-CM | POA: Diagnosis not present

## 2015-02-15 DIAGNOSIS — E119 Type 2 diabetes mellitus without complications: Secondary | ICD-10-CM | POA: Diagnosis not present

## 2015-02-20 ENCOUNTER — Other Ambulatory Visit: Payer: Self-pay | Admitting: Adult Health

## 2015-02-21 ENCOUNTER — Other Ambulatory Visit: Payer: Self-pay | Admitting: Adult Health

## 2015-04-18 ENCOUNTER — Other Ambulatory Visit: Payer: Self-pay | Admitting: Adult Health

## 2015-08-28 ENCOUNTER — Other Ambulatory Visit: Payer: Self-pay | Admitting: Internal Medicine

## 2015-08-28 DIAGNOSIS — Z1231 Encounter for screening mammogram for malignant neoplasm of breast: Secondary | ICD-10-CM

## 2015-09-04 ENCOUNTER — Ambulatory Visit: Payer: Medicare PPO

## 2015-09-13 ENCOUNTER — Ambulatory Visit
Admission: RE | Admit: 2015-09-13 | Discharge: 2015-09-13 | Disposition: A | Discharge status: Home / Self Care | Payer: Medicare Other | Source: Ambulatory Visit | Attending: Internal Medicine | Admitting: Internal Medicine

## 2015-09-13 DIAGNOSIS — Z1231 Encounter for screening mammogram for malignant neoplasm of breast: Secondary | ICD-10-CM

## 2016-03-16 ENCOUNTER — Emergency Department (HOSPITAL_COMMUNITY): Payer: Medicare Other

## 2016-03-16 ENCOUNTER — Encounter (HOSPITAL_COMMUNITY): Payer: Self-pay | Admitting: Emergency Medicine

## 2016-03-16 ENCOUNTER — Observation Stay (HOSPITAL_COMMUNITY)
Admission: EM | Admit: 2016-03-16 | Discharge: 2016-03-18 | Disposition: A | Discharge status: Skilled Nursing Facility | Payer: Medicare Other | Attending: Family Medicine | Admitting: Family Medicine

## 2016-03-16 DIAGNOSIS — R0789 Other chest pain: Secondary | ICD-10-CM | POA: Diagnosis not present

## 2016-03-16 DIAGNOSIS — M549 Dorsalgia, unspecified: Secondary | ICD-10-CM | POA: Diagnosis present

## 2016-03-16 DIAGNOSIS — G4733 Obstructive sleep apnea (adult) (pediatric): Secondary | ICD-10-CM | POA: Insufficient documentation

## 2016-03-16 DIAGNOSIS — Z87891 Personal history of nicotine dependence: Secondary | ICD-10-CM | POA: Insufficient documentation

## 2016-03-16 DIAGNOSIS — M50221 Other cervical disc displacement at C4-C5 level: Secondary | ICD-10-CM | POA: Diagnosis not present

## 2016-03-16 DIAGNOSIS — G8929 Other chronic pain: Secondary | ICD-10-CM | POA: Insufficient documentation

## 2016-03-16 DIAGNOSIS — E876 Hypokalemia: Secondary | ICD-10-CM | POA: Insufficient documentation

## 2016-03-16 DIAGNOSIS — K219 Gastro-esophageal reflux disease without esophagitis: Secondary | ICD-10-CM | POA: Insufficient documentation

## 2016-03-16 DIAGNOSIS — Z794 Long term (current) use of insulin: Secondary | ICD-10-CM | POA: Insufficient documentation

## 2016-03-16 DIAGNOSIS — J449 Chronic obstructive pulmonary disease, unspecified: Secondary | ICD-10-CM | POA: Diagnosis present

## 2016-03-16 DIAGNOSIS — I10 Essential (primary) hypertension: Secondary | ICD-10-CM | POA: Diagnosis not present

## 2016-03-16 DIAGNOSIS — E119 Type 2 diabetes mellitus without complications: Secondary | ICD-10-CM | POA: Insufficient documentation

## 2016-03-16 DIAGNOSIS — M545 Low back pain: Principal | ICD-10-CM | POA: Insufficient documentation

## 2016-03-16 DIAGNOSIS — R112 Nausea with vomiting, unspecified: Secondary | ICD-10-CM | POA: Diagnosis not present

## 2016-03-16 DIAGNOSIS — E785 Hyperlipidemia, unspecified: Secondary | ICD-10-CM | POA: Diagnosis present

## 2016-03-16 DIAGNOSIS — M542 Cervicalgia: Secondary | ICD-10-CM | POA: Diagnosis present

## 2016-03-16 DIAGNOSIS — E1149 Type 2 diabetes mellitus with other diabetic neurological complication: Secondary | ICD-10-CM

## 2016-03-16 DIAGNOSIS — M940 Chondrocostal junction syndrome [Tietze]: Secondary | ICD-10-CM | POA: Diagnosis not present

## 2016-03-16 LAB — TROPONIN I

## 2016-03-16 LAB — COMPREHENSIVE METABOLIC PANEL
ALT: 12 U/L — ABNORMAL LOW (ref 14–54)
AST: 20 U/L (ref 15–41)
Albumin: 4.2 g/dL (ref 3.5–5.0)
Alkaline Phosphatase: 99 U/L (ref 38–126)
Anion gap: 12 (ref 5–15)
BILIRUBIN TOTAL: 2 mg/dL — AB (ref 0.3–1.2)
BUN: 16 mg/dL (ref 6–20)
CO2: 25 mmol/L (ref 22–32)
CREATININE: 0.78 mg/dL (ref 0.44–1.00)
Calcium: 9.3 mg/dL (ref 8.9–10.3)
Chloride: 100 mmol/L — ABNORMAL LOW (ref 101–111)
GFR calc Af Amer: >60 mL/min (ref >60)
Glucose, Bld: 81 mg/dL (ref 65–99)
POTASSIUM: 2.9 mmol/L — AB (ref 3.5–5.1)
Sodium: 137 mmol/L (ref 135–145)
TOTAL PROTEIN: 7.7 g/dL (ref 6.5–8.1)

## 2016-03-16 LAB — CBC WITH DIFFERENTIAL/PLATELET
BASOS ABS: 0 10*3/uL (ref 0.0–0.1)
Basophils Relative: 0 %
Eosinophils Absolute: 0 10*3/uL (ref 0.0–0.7)
Eosinophils Relative: 0 %
HEMATOCRIT: 42.5 % (ref 36.0–46.0)
Hemoglobin: 14.3 g/dL (ref 12.0–15.0)
LYMPHS ABS: 0.8 10*3/uL (ref 0.7–4.0)
LYMPHS PCT: 9 %
MCH: 28.6 pg (ref 26.0–34.0)
MCHC: 33.6 g/dL (ref 30.0–36.0)
MCV: 85 fL (ref 78.0–100.0)
MONO ABS: 0.9 10*3/uL (ref 0.1–1.0)
MONOS PCT: 10 %
Neutro Abs: 7.1 10*3/uL (ref 1.7–7.7)
Neutrophils Relative %: 81 %
Platelets: 314 10*3/uL (ref 150–400)
RBC: 5 MIL/uL (ref 3.87–5.11)
RDW: 14.6 % (ref 11.5–15.5)
WBC: 8.8 10*3/uL (ref 4.0–10.5)

## 2016-03-16 LAB — GLUCOSE, CAPILLARY: Glucose-Capillary: 90 mg/dL (ref 65–99)

## 2016-03-16 LAB — LIPASE, BLOOD: LIPASE: 24 U/L (ref 11–51)

## 2016-03-16 MED ORDER — IBUPROFEN 200 MG PO TABS: 200.0000 mg | ORAL_TABLET | Freq: Three times a day (TID) | ORAL | Status: DC | PRN

## 2016-03-16 MED ORDER — AMLODIPINE BESYLATE 5 MG PO TABS
10.0000 mg | ORAL_TABLET | Freq: Every day | ORAL | Status: DC
  Administered 2016-03-16 – 2016-03-18 (×3): 10 mg via ORAL
  Filled 2016-03-16 (×3): qty 2

## 2016-03-16 MED ORDER — ACETAMINOPHEN 325 MG PO TABS: 650.0000 mg | ORAL_TABLET | Freq: Four times a day (QID) | ORAL | Status: DC | PRN

## 2016-03-16 MED ORDER — LORATADINE 10 MG PO TABS
10.0000 mg | ORAL_TABLET | Freq: Every day | ORAL | Status: DC
  Administered 2016-03-17 – 2016-03-18 (×2): 10 mg via ORAL
  Filled 2016-03-16 (×3): qty 1

## 2016-03-16 MED ORDER — POTASSIUM CHLORIDE 10 MEQ/100ML IV SOLN
10.0000 meq | INTRAVENOUS | Status: AC
  Administered 2016-03-16 – 2016-03-17 (×3): 10 meq via INTRAVENOUS
  Filled 2016-03-16 (×3): qty 100

## 2016-03-16 MED ORDER — ALBUTEROL SULFATE (2.5 MG/3ML) 0.083% IN NEBU: 2.5000 mg | INHALATION_SOLUTION | RESPIRATORY_TRACT | Status: DC | PRN

## 2016-03-16 MED ORDER — ONDANSETRON HCL 4 MG/2ML IJ SOLN: 4.0000 mg | Freq: Four times a day (QID) | INTRAMUSCULAR | Status: DC | PRN

## 2016-03-16 MED ORDER — ONDANSETRON HCL 4 MG/2ML IJ SOLN
4.0000 mg | Freq: Once | INTRAMUSCULAR | Status: DC
  Filled 2016-03-16: qty 2

## 2016-03-16 MED ORDER — MAGNESIUM SULFATE IN D5W 1-5 GM/100ML-% IV SOLN
1.0000 g | Freq: Once | INTRAVENOUS | Status: AC
  Administered 2016-03-16: 1 g via INTRAVENOUS
  Filled 2016-03-16: qty 100

## 2016-03-16 MED ORDER — ZOLPIDEM TARTRATE 5 MG PO TABS: 5.0000 mg | ORAL_TABLET | Freq: Every evening | ORAL | Status: DC | PRN

## 2016-03-16 MED ORDER — HYDROMORPHONE HCL 1 MG/ML IJ SOLN
1.0000 mg | Freq: Once | INTRAMUSCULAR | Status: AC
  Administered 2016-03-16: 1 mg via INTRAVENOUS
  Filled 2016-03-16: qty 1

## 2016-03-16 MED ORDER — HYDROMORPHONE HCL 1 MG/ML IJ SOLN
0.5000 mg | Freq: Once | INTRAMUSCULAR | Status: AC
  Administered 2016-03-16: 0.5 mg via INTRAVENOUS
  Filled 2016-03-16: qty 1

## 2016-03-16 MED ORDER — POTASSIUM CHLORIDE 20 MEQ/15ML (10%) PO SOLN
40.0000 meq | Freq: Once | ORAL | Status: AC
  Administered 2016-03-16: 40 meq via ORAL
  Filled 2016-03-16: qty 30

## 2016-03-16 MED ORDER — ATORVASTATIN CALCIUM 80 MG PO TABS
80.0000 mg | ORAL_TABLET | Freq: Every day | ORAL | Status: DC
  Administered 2016-03-16 – 2016-03-18 (×3): 80 mg via ORAL
  Filled 2016-03-16 (×2): qty 1
  Filled 2016-03-16 (×3): qty 2
  Filled 2016-03-16: qty 1
  Filled 2016-03-16: qty 2

## 2016-03-16 MED ORDER — MORPHINE SULFATE (PF) 2 MG/ML IV SOLN: 1.0000 mg | INTRAVENOUS | Status: DC | PRN

## 2016-03-16 MED ORDER — VITAMIN D3 25 MCG (1000 UNIT) PO TABS
1000.0000 [IU] | ORAL_TABLET | Freq: Every day | ORAL | Status: DC
  Administered 2016-03-16 – 2016-03-18 (×3): 1000 [IU] via ORAL
  Filled 2016-03-16 (×3): qty 1

## 2016-03-16 MED ORDER — PANTOPRAZOLE SODIUM 40 MG PO TBEC
40.0000 mg | DELAYED_RELEASE_TABLET | Freq: Two times a day (BID) | ORAL | Status: DC
  Administered 2016-03-16 – 2016-03-18 (×4): 40 mg via ORAL
  Filled 2016-03-16 (×4): qty 1

## 2016-03-16 MED ORDER — POTASSIUM CHLORIDE 2 MEQ/ML IV SOLN: 30.0000 meq | Freq: Once | INTRAVENOUS | Status: DC

## 2016-03-16 MED ORDER — LOPERAMIDE HCL 2 MG PO CAPS: 2.0000 mg | ORAL_CAPSULE | ORAL | Status: DC | PRN

## 2016-03-16 MED ORDER — ENOXAPARIN SODIUM 40 MG/0.4ML ~~LOC~~ SOLN
40.0000 mg | SUBCUTANEOUS | Status: DC
  Administered 2016-03-16 – 2016-03-17 (×2): 40 mg via SUBCUTANEOUS
  Filled 2016-03-16 (×2): qty 0.4

## 2016-03-16 MED ORDER — ONDANSETRON HCL 4 MG/2ML IJ SOLN
4.0000 mg | Freq: Once | INTRAMUSCULAR | Status: AC
Start: 2016-03-16 — End: 2016-03-16
  Administered 2016-03-16: 4 mg via INTRAVENOUS
  Filled 2016-03-16: qty 2

## 2016-03-16 MED ORDER — BISMUTH SUBSALICYLATE 262 MG PO CHEW
524.0000 mg | CHEWABLE_TABLET | ORAL | Status: DC | PRN
  Filled 2016-03-16: qty 2

## 2016-03-16 MED ORDER — SODIUM CHLORIDE 0.9% FLUSH
3.0000 mL | Freq: Two times a day (BID) | INTRAVENOUS | Status: DC
  Administered 2016-03-17: 3 mL via INTRAVENOUS

## 2016-03-16 MED ORDER — ACETAMINOPHEN 650 MG RE SUPP: 650.0000 mg | Freq: Four times a day (QID) | RECTAL | Status: DC | PRN

## 2016-03-16 MED ORDER — ONDANSETRON HCL 4 MG PO TABS: 4.0000 mg | ORAL_TABLET | Freq: Four times a day (QID) | ORAL | Status: DC | PRN

## 2016-03-16 MED ORDER — OXYCODONE-ACETAMINOPHEN 5-325 MG PO TABS: 1.0000 | ORAL_TABLET | ORAL | Status: DC | PRN

## 2016-03-16 MED ORDER — GABAPENTIN 400 MG PO CAPS
1200.0000 mg | ORAL_CAPSULE | Freq: Every day | ORAL | Status: DC
  Administered 2016-03-16 – 2016-03-17 (×2): 1200 mg via ORAL
  Filled 2016-03-16 (×2): qty 3

## 2016-03-16 MED ORDER — LOSARTAN POTASSIUM 50 MG PO TABS
50.0000 mg | ORAL_TABLET | Freq: Every day | ORAL | Status: DC
  Administered 2016-03-16 – 2016-03-18 (×3): 50 mg via ORAL
  Filled 2016-03-16 (×3): qty 1

## 2016-03-16 MED ORDER — INSULIN ASPART 100 UNIT/ML ~~LOC~~ SOLN: 0.0000 [IU] | Freq: Every day | SUBCUTANEOUS | Status: DC

## 2016-03-16 MED ORDER — INSULIN ASPART 100 UNIT/ML ~~LOC~~ SOLN
0.0000 [IU] | Freq: Three times a day (TID) | SUBCUTANEOUS | Status: DC
  Administered 2016-03-17: 1 [IU] via SUBCUTANEOUS
  Administered 2016-03-18: 2 [IU] via SUBCUTANEOUS

## 2016-03-16 MED ORDER — LORAZEPAM 2 MG/ML IJ SOLN
0.5000 mg | Freq: Once | INTRAMUSCULAR | Status: AC
  Administered 2016-03-16: 0.5 mg via INTRAVENOUS
  Filled 2016-03-16: qty 1

## 2016-03-16 NOTE — ED Notes (Signed)
Unable to get blood ?

## 2016-03-16 NOTE — ED Notes (Signed)
Patient transported to CT 

## 2016-03-16 NOTE — ED Notes (Signed)
Pt's daughter [name]) has requested to be updated on [phone]

## 2016-03-16 NOTE — ED Notes (Signed)
Blood draw x1 unsuccessful. 

## 2016-03-16 NOTE — ED Provider Notes (Signed)
[facility] DEPT Provider Note   CSN: [id number] Arrival date & time: 03/16/16  1401     History   Chief Complaint Chief Complaint  Patient presents with  . Back Pain    HPI [name] is a 82 y.o. female.  Patient complains of upper back pain for a few days. She is been staying upstairs and not been able to get downstairs to take care of herself she lives by herself   The history is provided by the patient. No language interpreter was used.  Back Pain   This is a new problem. The current episode started more than 2 days ago. The problem occurs constantly. The problem has not changed since onset.The pain is associated with no known injury. The pain is present in the thoracic spine. The quality of the pain is described as aching. Pertinent negatives include no chest pain, no headaches and no abdominal pain.    Past Medical History:  Diagnosis Date  . Allergic rhinitis   . Arthritis   . Complication of anesthesia    Hard time waking up  . COPD (chronic obstructive pulmonary disease) ([place])    PFT 08/08/09-FEV1 1.98/107; R 0.77; small airway obst w/tresp to dilator;DLCO 44%  . Diabetes mellitus   . Fibromuscular dysplasia ([facility])    RAS  . Hyperlipidemia   . Hypertension   . PONV (postoperative nausea and vomiting)   . Pseudogout   . Sleep apnea    AHI 89/hr    Patient Active Problem List   Diagnosis Date Noted  . Neck pain 03/16/2016  . Polyarticular pseudogout 01/06/2015  . Knee effusion   . Pseudogout 02/14/2014  . Knee effusion, left 05/17/2013  . Fatigue 08/14/2011  . Edema 03/19/2011  . COPD (chronic obstructive pulmonary disease) ([place]) 02/02/2011  . AORTIC VALVE DISORDERS 02/27/2010  . DM type 2 (diabetes mellitus, type 2) ([place]) 01/29/2010  . DYSPNEA 10/29/2009  . ALLERGIC RHINITIS 12/24/2008  . Hyperlipidemia 12/19/2008  . Essential hypertension 12/19/2008  . Obstructive sleep apnea 12/19/2008    Past Surgical History:  Procedure  Laterality Date  . BACK SURGERY  2005  . bilateral renal bypass    . ESOPHAGOGASTRODUODENOSCOPY N/A 10/01/2012   Procedure: ESOPHAGOGASTRODUODENOSCOPY (EGD);  Surgeon: Missy Sabins, MD;  Location: [facility] ENDOSCOPY;  Service: Endoscopy;  Laterality: N/A;  . nasal septal deviation    . pituitary tumor removed     x 2  . TONSILLECTOMY      OB History    No data available       Home Medications    Prior to Admission medications   Medication Sig Start Date End Date Taking? Authorizing Provider  amLODipine (NORVASC) 10 MG tablet Take 10 mg by mouth daily.    Yes Historical Provider, MD  atorvastatin (LIPITOR) 80 MG tablet Take 80 mg by mouth daily.     Yes Historical Provider, MD  bismuth subsalicylate (PEPTO BISMOL) 262 MG chewable tablet Chew 524 mg by mouth as needed.   Yes Historical Provider, MD  cholecalciferol (VITAMIN D) 1000 units tablet Take 1,000 Units by mouth daily.   Yes Historical Provider, MD  fexofenadine (ALLEGRA) 180 MG tablet Take 180 mg by mouth daily.   Yes Historical Provider, MD  gabapentin (NEURONTIN) 600 MG tablet Take 1,200 mg by mouth at bedtime.    Yes Historical Provider, MD  Loperamide HCl (IMODIUM A-D PO) Take 1 tablet by mouth as needed (loose stools).   Yes Historical Provider, MD  losartan (  COZAAR) 50 MG tablet Take 50 mg by mouth daily. 03/11/16  Yes Historical Provider, MD  pantoprazole (PROTONIX) 40 MG tablet Take 40 mg by mouth 2 (two) times daily. 01/09/14  Yes Historical Provider, MD  potassium chloride (MICRO-K) 10 MEQ CR capsule Take 10 mEq by mouth daily.   Yes Historical Provider, MD  traMADol (ULTRAM) 50 MG tablet Take 50 mg by mouth 2 (two) times daily.   Yes Historical Provider, MD  albuterol (PROVENTIL) (2.5 MG/3ML) 0.083% nebulizer solution Take 3 mLs (2.5 mg total) by nebulization every 6 (six) hours as needed for wheezing or shortness of breath. Patient not taking: Reported on 03/16/2016 01/08/15   Reyne Dumas, MD  insulin aspart (NOVOLOG) 100  UNIT/ML injection 3 times a day before meals  Question Answer Comment   Correction coverage: Sensitive (thin, NPO, renal)    CBG < 70: implement hypoglycemia protocol    CBG 70 - 120: 0 units    CBG 121 - 150: 1 unit    CBG 151 - 200: 2 units    CBG 201 - 250: 3 units    CBG 251 - 300: 5 units    CBG 301 - 350: 7 units    CBG 351 - 400 9 units    CBG > 400 call MD and obtain STAT lab verification Patient not taking: Reported on 03/16/2016 01/08/15   Reyne Dumas, MD    Family History Family History  Problem Relation Age of Onset  . Stroke Father   . Breast cancer Mother     Social History Social History  Substance Use Topics  . Smoking status: Former Smoker    Packs/day: 1.00    Years: 35.00    Types: Cigarettes    Quit date: 02/17/1997  . Smokeless tobacco: Never Used  . Alcohol use No     Allergies   Valacyclovir hcl   Review of Systems Review of Systems  Constitutional: Negative for appetite change and fatigue.  HENT: Negative for congestion, ear discharge and sinus pressure.   Eyes: Negative for discharge.  Respiratory: Negative for cough.   Cardiovascular: Negative for chest pain.  Gastrointestinal: Negative for abdominal pain and diarrhea.  Genitourinary: Negative for frequency and hematuria.  Musculoskeletal: Positive for back pain.  Skin: Negative for rash.  Neurological: Negative for seizures and headaches.  Psychiatric/Behavioral: Negative for hallucinations.     Physical Exam Updated Vital Signs BP 120/66   Pulse 77   Temp 97.5 F (36.4 C) (Oral)   Resp 14   Ht 5' 2.5" (1.588 m)   Wt 150 lb (68 kg)   SpO2 (!) 82% Comment: Pt had removed her nasal canual O2  BMI 27.00 kg/m   Physical Exam  Constitutional: She is oriented to person, place, and time. She appears well-developed.  HENT:  Head: Normocephalic.  Tenderness to distal right lateral neck  Eyes: Conjunctivae and EOM are normal. No scleral icterus.  Neck: Neck  supple. No thyromegaly present.  Cardiovascular: Normal rate and regular rhythm.  Exam reveals no gallop and no friction rub.   No murmur heard. Pulmonary/Chest: No stridor. She has no wheezes. She has no rales. She exhibits no tenderness.  Abdominal: She exhibits no distension. There is no tenderness. There is no rebound.  Musculoskeletal: Normal range of motion. She exhibits no edema.  Tenderness to upper thoracic spine  Lymphadenopathy:    She has no cervical adenopathy.  Neurological: She is oriented to person, place, and time. She exhibits normal muscle  tone. Coordination normal.  Skin: No rash noted. No erythema.  Psychiatric: She has a normal mood and affect. Her behavior is normal.     ED Treatments / Results  Labs (all labs ordered are listed, but only abnormal results are displayed) Labs Reviewed  COMPREHENSIVE METABOLIC PANEL - Abnormal; Notable for the following:       Result Value   Potassium 2.9 (*)    Chloride 100 (*)    ALT 12 (*)    Total Bilirubin 2.0 (*)    All other components within normal limits  CBC WITH DIFFERENTIAL/PLATELET    EKG  EKG Interpretation None       Radiology Ct Chest Wo Contrast  Result Date: 03/16/2016 CLINICAL DATA:  Chronic upper back pain.  No known injury. EXAM: CT CHEST WITHOUT CONTRAST TECHNIQUE: Multidetector CT imaging of the chest was performed following the standard protocol without IV contrast. COMPARISON:  Chest x-ray 09/30/2012.  Chest CT 05/15/2011 FINDINGS: Cardiovascular: Heart is normal size. Diffusely calcified aorta. Proximal descending thoracic aorta 3.5 cm. There are displaced calcifications within the proximal descending thoracic aorta, likely reflecting chronic dissection. This is stable since 2013. Moderate coronary artery calcifications. Mediastinum/Nodes: Scattered small and borderline sized mediastinal lymph nodes, unchanged. No mediastinal, hilar, or axillary adenopathy. Lungs/Pleura: Mild centrilobular  emphysema. No confluent airspace opacities. No pleural effusions. Upper Abdomen: Imaging into the upper abdomen shows no acute findings. Musculoskeletal: No acute bony abnormality or focal bone lesion. Prior vertebroplasty in the lower thoracic spine. IMPRESSION: Diffusely calcified aorta of with chronically displaced calcifications in the proximal descending thoracic aorta suggesting chronic dissection, unchanged since 2013. Slight dilatation of the proximal descending thoracic aorta, 3.5 cm. Coronary artery disease. Mild emphysema. Electronically Signed   By: Rolm Baptise M.D.   On: 03/16/2016 15:36   Ct Cervical Spine Wo Contrast  Result Date: 03/16/2016 CLINICAL DATA:  Chronic cervical spine pain. EXAM: CT CERVICAL SPINE WITHOUT CONTRAST TECHNIQUE: Multidetector CT imaging of the cervical spine was performed without intravenous contrast. Multiplanar CT image reconstructions were also generated. COMPARISON:  None. FINDINGS: Alignment: Normal. Skull base and vertebrae: No acute fracture. No primary bone lesion or focal pathologic process. Soft tissues and spinal canal: No prevertebral fluid or swelling. No visible canal hematoma. Disc levels: C2-3:  Mild diffuse disc bulge with mild central spinal narrowing. C3-4: Mild to moderate degenerative disc disease, spondylosis, facet arthropathy and diffuse disc bulge contribute to mild central spinal narrowing and mild to moderate left bony foraminal narrowing. C4-5: Mild diffuse disc bulge and mild facet arthropathy contribute to mild central spinal and mild left foraminal narrowing. C5-6: Moderate degenerative disc disease, spondylosis and facet arthropathy contribute to moderate central spinal and moderate bilateral bony foraminal narrowing. C6-7: Moderate degenerative disc disease, spondylosis and facet arthropathy contribute to mild to moderate central spinal and mild to moderate bilateral bony foraminal narrowing. Upper chest: Negative. Other: None. IMPRESSION:  No evidence of acute abnormality. Multilevel degenerative changes contributing to central spinal and foraminal narrowing as described. Electronically Signed   By: Margarette Canada M.D.   On: 03/16/2016 15:42    Procedures Procedures (including critical care time)  Medications Ordered in ED Medications  ondansetron (ZOFRAN) injection 4 mg (not administered)  potassium chloride 10 mEq in 100 mL IVPB (not administered)  potassium chloride 20 MEQ/15ML (10%) solution 40 mEq (not administered)  magnesium sulfate IVPB 1 g 100 mL (not administered)  HYDROmorphone (DILAUDID) injection 1 mg (1 mg Intravenous Given 03/16/16 1442)  ondansetron (ZOFRAN) injection  4 mg (4 mg Intravenous Given 03/16/16 1442)  HYDROmorphone (DILAUDID) injection 0.5 mg (0.5 mg Intravenous Given 03/16/16 1620)  LORazepam (ATIVAN) injection 0.5 mg (0.5 mg Intravenous Given 03/16/16 1734)     Initial Impression / Assessment and Plan / ED Course  I have reviewed the triage vital signs and the nursing notes.  Pertinent labs & imaging results that were available during my care of the patient were reviewed by me and considered in my medical decision making (see chart for details).     CT scan of the neck shows significant disc disease. Also CT scan of the chest shows no acute findings. Patient had minimal help with pain medicine. She is hypokalemic and has had some vomiting in emergency department. She will be admitted to observation for pain control hypokalemia and further evaluation  Final Clinical Impressions(s) / ED Diagnoses   Final diagnoses:  Upper back pain on right side  Hypokalemia    New Prescriptions New Prescriptions   No medications on file     Milton Ferguson, MD 03/16/16 2010

## 2016-03-16 NOTE — ED Triage Notes (Signed)
Patient here from home with complaints of lower back pain and neck pain. Reports that she is unable to get downstairs on her own. Pain 10/10.

## 2016-03-16 NOTE — H&P (Signed)
History and Physical    AZUREDEE COULL [id number] DOB: 09-18-1934 DOA: 03/16/2016  Referring MD/NP/PA:   PCP: [name], MD   Patient coming from:  The patient is coming from home.  At baseline, pt is independent for most of ADL.   Chief Complaint: Neck pain, back pain, nausea, vomiting, chest wall pain  HPI: [name] is a 82 y.o. female with medical history significant of hypertension, hyperlipidemia, diabetes mellitus, COPD, GERD, OSA on CPAP, pseudogout, s/p of pituitary removal, chronic lower back pain, who presents with neck pain, back pain, nausea, vomiting, chest wall pain.  Patient states that she has chronic lower back pain due to ruptured disc which has worsened recently. She had back surgery 2005. Her major issue today is severe neck pain. It is located in the right lateral neck and posterior neck, constant, 10 out of 10 in severity, nonradiating. No arm weakness or numbness. It started on Thursday, and has been progressively getting worse. She also complains of whole back pain. No alarming symptoms, such as leg numbness or weakness, incontinence of bladder or bowel movement. She reports chest pain, which is actually due to chest wall pain. Slight touching to her chest wall causes severe pain. Due to the chest wall pain, patient cannot take deep breath. No cough, fever or chills. She did not have from GI symptoms at home, but had nausea and vomited twice in the emergency room. Patient denies abdominal pain or diarrhea. No symptoms of UTI, unilateral weakness, vision change or hearing loss.  ED Course: pt was found to have WBC 8.8, lipase 24, potassium 2.9, temperature normal, tachycardia, O2 saturation 82-96% on room air. Patient is placed on telemetry bed for observation.  # CT-spin showed no evidence of acute abnormality, multilevel degenerative changes contributing to central spinal and foraminal narrowing.  # CT-chest without contrast showed diffusely  calcified aorta of with chronically displaced calcifications in the proximal descending thoracic aorta suggesting chronic dissection, unchanged since 2013. Slight dilatation of the proximal descending thoracic aorta, 3.5 cm.  Review of Systems:   General: no fevers, chills, no changes in body weight, has poor appetite, has fatigue HEENT: no blurry vision, hearing changes or sore throat Respiratory: no dyspnea, coughing, wheezing CV: has chest wall pain, no palpitations GI: no nausea, vomiting, abdominal pain, diarrhea, constipation GU: no dysuria, burning on urination, increased urinary frequency, hematuria  Ext: no leg edema Neuro: no unilateral weakness, numbness, or tingling, no vision change or hearing loss Skin: no rash, no skin tear. MSK: has neck and back pain Heme: No easy bruising.  Travel history: No recent long distant travel.  Allergy:  Allergies  Allergen Reactions  . Valacyclovir Hcl Rash    Past Medical History:  Diagnosis Date  . Allergic rhinitis   . Arthritis   . Complication of anesthesia    Hard time waking up  . COPD (chronic obstructive pulmonary disease) ([place])    PFT 08/08/09-FEV1 1.98/107; R 0.77; small airway obst w/tresp to dilator;DLCO 44%  . Diabetes mellitus   . Fibromuscular dysplasia (HCC)    RAS  . Hyperlipidemia   . Hypertension   . PONV (postoperative nausea and vomiting)   . Pseudogout   . Sleep apnea    AHI 89/hr    Past Surgical History:  Procedure Laterality Date  . BACK SURGERY  2005  . bilateral renal bypass    . ESOPHAGOGASTRODUODENOSCOPY N/A 10/01/2012   Procedure: ESOPHAGOGASTRODUODENOSCOPY (EGD);  Surgeon: Missy Sabins, MD;  Location: [facility];  Service: Endoscopy;  Laterality: N/A;  . nasal septal deviation    . pituitary tumor removed     x 2  . TONSILLECTOMY      Social History:  reports that she quit smoking about 19 years ago. Her smoking use included Cigarettes. She has a 35.00 pack-year smoking history. She has  never used smokeless tobacco. She reports that she does not drink alcohol or use drugs.  Family History:  Family History  Problem Relation Age of Onset  . Stroke Father   . Breast cancer Mother      Prior to Admission medications   Medication Sig Start Date End Date Taking? Authorizing Provider  amLODipine (NORVASC) 10 MG tablet Take 10 mg by mouth daily.    Yes Historical Provider, MD  atorvastatin (LIPITOR) 80 MG tablet Take 80 mg by mouth daily.     Yes Historical Provider, MD  bismuth subsalicylate (PEPTO BISMOL) 262 MG chewable tablet Chew 524 mg by mouth as needed.   Yes Historical Provider, MD  cholecalciferol (VITAMIN D) 1000 units tablet Take 1,000 Units by mouth daily.   Yes Historical Provider, MD  fexofenadine (ALLEGRA) 180 MG tablet Take 180 mg by mouth daily.   Yes Historical Provider, MD  gabapentin (NEURONTIN) 600 MG tablet Take 1,200 mg by mouth at bedtime.    Yes Historical Provider, MD  Loperamide HCl (IMODIUM A-D PO) Take 1 tablet by mouth as needed (loose stools).   Yes Historical Provider, MD  losartan (COZAAR) 50 MG tablet Take 50 mg by mouth daily. 03/11/16  Yes Historical Provider, MD  pantoprazole (PROTONIX) 40 MG tablet Take 40 mg by mouth 2 (two) times daily. 01/09/14  Yes Historical Provider, MD  potassium chloride (MICRO-K) 10 MEQ CR capsule Take 10 mEq by mouth daily.   Yes Historical Provider, MD  traMADol (ULTRAM) 50 MG tablet Take 50 mg by mouth 2 (two) times daily.   Yes Historical Provider, MD  albuterol (PROVENTIL) (2.5 MG/3ML) 0.083% nebulizer solution Take 3 mLs (2.5 mg total) by nebulization every 6 (six) hours as needed for wheezing or shortness of breath. Patient not taking: Reported on 03/16/2016 01/08/15   Reyne Dumas, MD  insulin aspart (NOVOLOG) 100 UNIT/ML injection 3 times a day before meals  Question Answer Comment   Correction coverage: Sensitive (thin, NPO, renal)    CBG < 70: implement hypoglycemia protocol    CBG 70 - 120: 0  units    CBG 121 - 150: 1 unit    CBG 151 - 200: 2 units    CBG 201 - 250: 3 units    CBG 251 - 300: 5 units    CBG 301 - 350: 7 units    CBG 351 - 400 9 units    CBG > 400 call MD and obtain STAT lab verification Patient not taking: Reported on 03/16/2016 01/08/15   Reyne Dumas, MD    Physical Exam: Vitals:   03/16/16 1729 03/16/16 1730 03/16/16 2033 03/16/16 2108  BP: 110/63 120/66 148/64 (!) 128/59  Pulse: 75 77 110 (!) 102  Resp: 15 14 18    Temp:    98.6 F (37 C)  TempSrc:    Oral  SpO2: 90% (!) 82% 94% 98%  Weight:    65.1 kg (143 lb 8.3 oz)  Height:    5\' 3"  (1.6 m)   General: Not in acute distress HEENT:       Eyes: PERRL, EOMI, no scleral icterus.  ENT: No discharge from the ears and nose, no pharynx injection, no tonsillar enlargement.        Neck: No JVD, no bruit, no mass felt. Heme: No neck lymph node enlargement. Cardiac: S1/S2, RRR, No murmurs, No gallops or rubs. Has severe front chest wall tenderness on palpation. Respiratory:  No rales, wheezing, rhonchi or rubs. GI: Soft, nondistended, nontender, no rebound pain, no organomegaly, BS present. GU: No hematuria Ext: No pitting leg edema bilaterally. 2+DP/PT pulse bilaterally. Musculoskeletal: has tenderness in the midline of whole spin, worst in neck. Skin: No rashes.  Neuro: Alert, oriented X3, cranial nerves II-XII grossly intact, moves all extremities normally. Muscle strength 5/5 in all extremities, sensation to light touch intact. Brachial reflex 2+ bilaterally. Knee reflex 1+ bilaterally. Negative Babinski's sign. Normal finger to nose test. Psych: Patient is not psychotic, no suicidal or hemocidal ideation.  Labs on Admission: I have personally reviewed following labs and imaging studies  CBC:  Recent Labs Lab 03/16/16 1507  WBC 8.8  NEUTROABS 7.1  HGB 14.3  HCT 42.5  MCV 85.0  PLT [id number]   Basic Metabolic Panel:  Recent Labs Lab 03/16/16 1507  NA 137  K 2.9*  CL 100*   CO2 25  GLUCOSE 81  BUN 16  CREATININE 0.78  CALCIUM 9.3   GFR: Estimated Creatinine Clearance: 51.8 mL/min (by C-G formula based on SCr of 0.78 mg/dL). Liver Function Tests:  Recent Labs Lab 03/16/16 1507  AST 20  ALT 12*  ALKPHOS 99  BILITOT 2.0*  PROT 7.7  ALBUMIN 4.2    Recent Labs Lab 03/16/16 2042  LIPASE 24   No results for input(s): AMMONIA in the last 168 hours. Coagulation Profile: No results for input(s): INR, PROTIME in the last 168 hours. Cardiac Enzymes:  Recent Labs Lab 03/16/16 2042  TROPONINI <0.03   BNP (last 3 results) No results for input(s): PROBNP in the last 8760 hours. HbA1C: No results for input(s): HGBA1C in the last 72 hours. CBG:  Recent Labs Lab 03/16/16 2141  GLUCAP 90   Lipid Profile: No results for input(s): CHOL, HDL, LDLCALC, TRIG, CHOLHDL, LDLDIRECT in the last 72 hours. Thyroid Function Tests: No results for input(s): TSH, T4TOTAL, FREET4, T3FREE, THYROIDAB in the last 72 hours. Anemia Panel: No results for input(s): VITAMINB12, FOLATE, FERRITIN, TIBC, IRON, RETICCTPCT in the last 72 hours. Urine analysis:    Component Value Date/Time   COLORURINE YELLOW 01/07/2015 [place] 01/07/2015 1617   LABSPEC 1.016 01/07/2015 1617   PHURINE 6.0 01/07/2015 1617   GLUCOSEU NEGATIVE 01/07/2015 1617   HGBUR NEGATIVE 01/07/2015 1617   BILIRUBINUR NEGATIVE 01/07/2015 1617   KETONESUR NEGATIVE 01/07/2015 1617   PROTEINUR 30 (A) 01/07/2015 1617   NITRITE NEGATIVE 01/07/2015 1617   LEUKOCYTESUR NEGATIVE 01/07/2015 1617   Sepsis Labs: @LABRCNTIP (procalcitonin:4,lacticidven:4) )No results found for this or any previous visit (from the past 240 hour(s)).   Radiological Exams on Admission: Ct Chest Wo Contrast  Result Date: 03/16/2016 CLINICAL DATA:  Chronic upper back pain.  No known injury. EXAM: CT CHEST WITHOUT CONTRAST TECHNIQUE: Multidetector CT imaging of the chest was performed following the standard  protocol without IV contrast. COMPARISON:  Chest x-ray 09/30/2012.  Chest CT 05/15/2011 FINDINGS: Cardiovascular: Heart is normal size. Diffusely calcified aorta. Proximal descending thoracic aorta 3.5 cm. There are displaced calcifications within the proximal descending thoracic aorta, likely reflecting chronic dissection. This is stable since 2013. Moderate coronary artery calcifications. Mediastinum/Nodes: Scattered small and borderline sized mediastinal  lymph nodes, unchanged. No mediastinal, hilar, or axillary adenopathy. Lungs/Pleura: Mild centrilobular emphysema. No confluent airspace opacities. No pleural effusions. Upper Abdomen: Imaging into the upper abdomen shows no acute findings. Musculoskeletal: No acute bony abnormality or focal bone lesion. Prior vertebroplasty in the lower thoracic spine. IMPRESSION: Diffusely calcified aorta of with chronically displaced calcifications in the proximal descending thoracic aorta suggesting chronic dissection, unchanged since 2013. Slight dilatation of the proximal descending thoracic aorta, 3.5 cm. Coronary artery disease. Mild emphysema. Electronically Signed   By: Rolm Baptise M.D.   On: 03/16/2016 15:36   Ct Cervical Spine Wo Contrast  Result Date: 03/16/2016 CLINICAL DATA:  Chronic cervical spine pain. EXAM: CT CERVICAL SPINE WITHOUT CONTRAST TECHNIQUE: Multidetector CT imaging of the cervical spine was performed without intravenous contrast. Multiplanar CT image reconstructions were also generated. COMPARISON:  None. FINDINGS: Alignment: Normal. Skull base and vertebrae: No acute fracture. No primary bone lesion or focal pathologic process. Soft tissues and spinal canal: No prevertebral fluid or swelling. No visible canal hematoma. Disc levels: C2-3:  Mild diffuse disc bulge with mild central spinal narrowing. C3-4: Mild to moderate degenerative disc disease, spondylosis, facet arthropathy and diffuse disc bulge contribute to mild central spinal narrowing  and mild to moderate left bony foraminal narrowing. C4-5: Mild diffuse disc bulge and mild facet arthropathy contribute to mild central spinal and mild left foraminal narrowing. C5-6: Moderate degenerative disc disease, spondylosis and facet arthropathy contribute to moderate central spinal and moderate bilateral bony foraminal narrowing. C6-7: Moderate degenerative disc disease, spondylosis and facet arthropathy contribute to mild to moderate central spinal and mild to moderate bilateral bony foraminal narrowing. Upper chest: Negative. Other: None. IMPRESSION: No evidence of acute abnormality. Multilevel degenerative changes contributing to central spinal and foraminal narrowing as described. Electronically Signed   By: Margarette Canada M.D.   On: 03/16/2016 15:42     EKG: Not done in ED, will get one.   Assessment/Plan Principal Problem:   Neck pain Active Problems:   Hyperlipidemia   Essential hypertension   Obstructive sleep apnea   DM type 2 (diabetes mellitus, type 2) (HCC)   COPD (chronic obstructive pulmonary disease) (HCC)   Chest wall pain   Hypokalemia   Nausea & vomiting   Neck pain: CT-C spin showed no evidence of acute abnormality, but has multilevel degenerative changes contributing to central spinal and foraminal narrowing. Pt does not have alarming symptoms, no arm weakness or numbness. -will place on tele bed for obs -pain control: When necessary Percocet and Neurontin -prn IV morphine for severe pain -pt/ot -May need neurosurgeons referral  HLD: Last LDL was not on record -Continue home medications: Lipitor  OSA: -CPAP  DM-II: Last A1c 6.2 on 01/07/15, well controled. Patient is taking novolog at home -SSI  Nausea and vomiting: Likely due to Dilaudid that was given in ED. lipase normal. No abdominal pain or diarrhea. -When necessary Zofran for nausea  Hypokalemia: K= 2.9 on admission. - Repleted and also gave 1 g of magnesium sulfate - Check Mg level  HTN:   -Continue amlodipine, losartan  GERD: -Protonix  COPD: stable -prn albuterol nebs  Chest wall pain: very tender on palpating her front chest wall. No hx of injury. Possible costochondritis or musculoskeletal pain. No signs of DVT, low suspicion for PE. -start Ibuprofen -check trop x 3 just in case   Abnormal findings on CT-chest without contrast: it showed diffusely calcified aorta of with chronically displaced calcifications in the proximal descending thoracic aorta suggesting chronic  dissection, unchanged since 2013. Slight dilatation of the proximal descending thoracic aorta, 3.5 cm. -f/u with PCP   DVT ppx: SQ Lovenox Code Status: Full code Family Communication: None at bed side.  Disposition Plan:  Anticipate discharge back to previous home environment Consults called:  none Admission status: Obs / tele         Date of Service 03/16/2016    Jenesis Suchy, [facility] Pager [phone]  If 7PM-7AM, please contact night-coverage [email or web address] Password [facility] 03/16/2016, 10:29 PM

## 2016-03-16 NOTE — ED Notes (Signed)
Bed: [id number] Expected date: 03/16/16 Expected time: 1:55 PM Means of arrival: Ambulance Comments: Back and Neck pain C collar

## 2016-03-17 DIAGNOSIS — R112 Nausea with vomiting, unspecified: Secondary | ICD-10-CM | POA: Diagnosis not present

## 2016-03-17 DIAGNOSIS — M542 Cervicalgia: Secondary | ICD-10-CM

## 2016-03-17 DIAGNOSIS — G4733 Obstructive sleep apnea (adult) (pediatric): Secondary | ICD-10-CM

## 2016-03-17 DIAGNOSIS — E119 Type 2 diabetes mellitus without complications: Secondary | ICD-10-CM | POA: Diagnosis not present

## 2016-03-17 DIAGNOSIS — R0789 Other chest pain: Secondary | ICD-10-CM

## 2016-03-17 DIAGNOSIS — I10 Essential (primary) hypertension: Secondary | ICD-10-CM | POA: Diagnosis not present

## 2016-03-17 DIAGNOSIS — J449 Chronic obstructive pulmonary disease, unspecified: Secondary | ICD-10-CM | POA: Diagnosis not present

## 2016-03-17 LAB — BASIC METABOLIC PANEL
Anion gap: 10 (ref 5–15)
BUN: 24 mg/dL — ABNORMAL HIGH (ref 6–20)
CALCIUM: 8.5 mg/dL — AB (ref 8.9–10.3)
CHLORIDE: 101 mmol/L (ref 101–111)
CO2: 24 mmol/L (ref 22–32)
CREATININE: 0.91 mg/dL (ref 0.44–1.00)
GFR, EST NON AFRICAN AMERICAN: 58 mL/min — AB (ref >60)
Glucose, Bld: 132 mg/dL — ABNORMAL HIGH (ref 65–99)
Potassium: 3.9 mmol/L (ref 3.5–5.1)
SODIUM: 135 mmol/L (ref 135–145)

## 2016-03-17 LAB — CBC
HCT: 39.2 % (ref 36.0–46.0)
Hemoglobin: 12.9 g/dL (ref 12.0–15.0)
MCH: 28.7 pg (ref 26.0–34.0)
MCHC: 32.9 g/dL (ref 30.0–36.0)
MCV: 87.3 fL (ref 78.0–100.0)
PLATELETS: 319 10*3/uL (ref 150–400)
RBC: 4.49 MIL/uL (ref 3.87–5.11)
RDW: 14.9 % (ref 11.5–15.5)
WBC: 9.3 10*3/uL (ref 4.0–10.5)

## 2016-03-17 LAB — GLUCOSE, CAPILLARY
GLUCOSE-CAPILLARY: 91 mg/dL (ref 65–99)
GLUCOSE-CAPILLARY: 137 mg/dL — AB (ref 65–99)
GLUCOSE-CAPILLARY: 109 mg/dL — AB (ref 65–99)
Glucose-Capillary: 139 mg/dL — ABNORMAL HIGH (ref 65–99)

## 2016-03-17 LAB — TROPONIN I: Troponin I: <0.03 ng/mL (ref <0.03)

## 2016-03-17 LAB — MAGNESIUM: MAGNESIUM: 2.3 mg/dL (ref 1.7–2.4)

## 2016-03-17 LAB — CK: CK TOTAL: 58 U/L (ref 38–234)

## 2016-03-17 MED ORDER — SODIUM CHLORIDE 0.9 % IV SOLN
INTRAVENOUS | Status: DC
  Administered 2016-03-17 – 2016-03-18 (×2): via INTRAVENOUS

## 2016-03-17 NOTE — Progress Notes (Signed)
CPAP on stand by at bedside.  Patient refused for tonight. States she just wants a good nights rest and she can't rest as well  with CPAP. States she wears off/on at home. Encouraged patient to use CPAP to benefit from a good nights sleep. RN at bedside.  Encouraged patient to call for Respiratory if CPAP desired.

## 2016-03-17 NOTE — NC FL2 (Signed)
Section LEVEL OF CARE SCREENING TOOL     IDENTIFICATION  Patient Name: [name] Birthdate: August 06, 1934 Sex: female Admission Date (Current Location): 03/16/2016  [facility] and [place] Number:  Herbalist and Address:  [facility],  [place], [place]      Provider Number: [id number]  Attending Physician Name and Address:  Doreatha Lew, MD  Relative Name and Phone Number:       Current Level of Care: Hospital Recommended Level of Care: [place] Prior Approval Number:    Date Approved/Denied:   PASRR Number: [id number]  Discharge Plan: SNF    Current Diagnoses: Patient Active Problem List   Diagnosis Date Noted  . Neck pain 03/16/2016  . Chest wall pain 03/16/2016  . Hypokalemia 03/16/2016  . Nausea & vomiting 03/16/2016  . Polyarticular pseudogout 01/06/2015  . Knee effusion   . Pseudogout 02/14/2014  . Knee effusion, left 05/17/2013  . Fatigue 08/14/2011  . Edema 03/19/2011  . COPD (chronic obstructive pulmonary disease) ([place]) 02/02/2011  . AORTIC VALVE DISORDERS 02/27/2010  . DM type 2 (diabetes mellitus, type 2) ([place]) 01/29/2010  . DYSPNEA 10/29/2009  . ALLERGIC RHINITIS 12/24/2008  . Hyperlipidemia 12/19/2008  . Essential hypertension 12/19/2008  . Obstructive sleep apnea 12/19/2008    Orientation RESPIRATION BLADDER Height & Weight     Self, Time, Situation, Place  Normal Continent Weight: 143 lb 8.3 oz (65.1 kg) Height:  5\' 3"  (160 cm)  BEHAVIORAL SYMPTOMS/MOOD NEUROLOGICAL BOWEL NUTRITION STATUS      Continent Diet (Renal/Carb Modified)  AMBULATORY STATUS COMMUNICATION OF NEEDS Skin   Extensive Assist Verbally Normal                       Personal Care Assistance Level of Assistance  Bathing, Dressing Bathing Assistance: Limited assistance   Dressing Assistance: Limited assistance     Functional Limitations Info             SPECIAL CARE FACTORS  FREQUENCY  PT (By licensed PT), OT (By licensed OT)     PT Frequency: 5 OT Frequency: 5            Contractures      Additional Factors Info  Code Status, Allergies Code Status Info: Fullcode Allergies Info: Valacyclovir Hcl           Current Medications (03/17/2016):  This is the current hospital active medication list Current Facility-Administered Medications  Medication Dose Route Frequency Provider Last Rate Last Dose  . acetaminophen (TYLENOL) tablet 650 mg  650 mg Oral Q6H PRN [name], MD       Or  . acetaminophen (TYLENOL) suppository 650 mg  650 mg Rectal Q6H PRN [name], MD      . albuterol (PROVENTIL) (2.5 MG/3ML) 0.083% nebulizer solution 2.5 mg  2.5 mg Nebulization Q4H PRN [name], MD      . amLODipine (NORVASC) tablet 10 mg  10 mg Oral Daily [name], MD   10 mg at 03/17/16 0913  . atorvastatin (LIPITOR) tablet 80 mg  80 mg Oral Daily [name], MD   80 mg at 03/17/16 0914  . bismuth subsalicylate (PEPTO BISMOL) chewable tablet 524 mg  524 mg Oral PRN [name], MD      . cholecalciferol (VITAMIN D) tablet 1,000 Units  1,000 Units Oral Daily [name], MD   1,000 Units at 03/17/16 0913  . enoxaparin (LOVENOX) injection 40  mg  40 mg Subcutaneous Q24H [name], MD   40 mg at 03/16/16 2218  . gabapentin (NEURONTIN) capsule 1,200 mg  1,200 mg Oral QHS [name], MD   1,200 mg at 03/16/16 2216  . ibuprofen (ADVIL,MOTRIN) tablet 200 mg  200 mg Oral Q8H PRN [name], MD      . insulin aspart (novoLOG) injection 0-5 Units  0-5 Units Subcutaneous QHS [name], MD      . insulin aspart (novoLOG) injection 0-9 Units  0-9 Units Subcutaneous TID WC [name], MD      . loperamide (IMODIUM) capsule 2 mg  2 mg Oral PRN [name], MD      . loratadine (CLARITIN) tablet 10 mg  10 mg Oral Daily [name], MD   10 mg at 03/17/16 0914  . losartan (COZAAR) tablet 50 mg  50 mg Oral Daily [name], MD   50 mg at 03/17/16 0914  . morphine 2 MG/ML injection 1 mg  1 mg  Intravenous Q2H PRN [name], MD      . ondansetron [facility]) tablet 4 mg  4 mg Oral Q6H PRN [name], MD       Or  . ondansetron [facility]) injection 4 mg  4 mg Intravenous Q6H PRN [name], MD      . oxyCODONE-acetaminophen (PERCOCET/ROXICET) 5-325 MG per tablet 1 tablet  1 tablet Oral Q4H PRN [name], MD      . pantoprazole (PROTONIX) EC tablet 40 mg  40 mg Oral BID [name], MD   40 mg at 03/17/16 0914  . sodium chloride flush (NS) 0.9 % injection 3 mL  3 mL Intravenous Q12H [name], MD   3 mL at 03/17/16 0915  . zolpidem (AMBIEN) tablet 5 mg  5 mg Oral QHS PRN [name], MD         Discharge Medications: Please see discharge summary for a list of discharge medications.  Relevant Imaging Results:  Relevant Lab Results:   Additional Information SSN: [id number]  Standley Brooking, LCSW

## 2016-03-17 NOTE — Progress Notes (Signed)
PROGRESS NOTE [facility]   KHYLA TROCHE   [id number] DOB: 09-Jan-1935  DOA: 03/16/2016 PCP: [name], MD   Brief Narrative:  DEMARCUS GUZMAN is a 82 y.o. female with medical history significant of hypertension, hyperlipidemia, diabetes mellitus, COPD, GERD, OSA on CPAP, pseudogout, s/p of pituitary removal, chronic lower back pain, who presented to the ED with neck pain, back pain, nausea, vomiting, chest wall pain.  Patient reports her neck has been bothering her for the past couple of weeks. She reports that has been having troubles walking around and doing her daily activities. Denies any extremity numbness or tingling sensation.  Daughter reported that neighbors call her because the patient was not picking up her newspapers. When she call her mother, she reported that she could not get out of the bed and was having extreme pain and was brought to the ED CT neck and chest was done in the ED which show chronic changes with mild foraminal narrowing.  Subjective: Patient seen and examined at bedside. Patient report neck pain, no other complaints   Assessment & Plan: Chronic Neck pain: CT-C spin showed no evidence of acute abnormality, but has multilevel degenerative changes contributing to central spinal and foraminal narrowing. Pt does not have alarming symptoms, no arm weakness or numbness. Pain med PRN  PT evaluation recommended SNF - will d/c to SNF when bed available  Per daughter patient has been deconditioning for the past few month - this has happened before Neurosurgery/Ortho eval as outpatient  OSA: -CPAP  DM-II: Last A1c 6.2 on 01/07/15, well controled. Patient is taking novolog at home -SSI  Nausea and vomiting: due to medication - resolved   Hypokalemia: K= 2.9 on admission. - resolved   HTN: - stable  -Continue amlodipine, losartan  GERD: -Protonix  COPD: stable -prn albuterol nebs  Chest wall pain: - costochondritis    R/o rhabdo - CK normal  Ibuprofen PRN   Abnormal findings on CT-chest without contrast: it showed diffusely calcified aorta of with chronically displaced calcifications in the proximal descending thoracic aorta suggesting chronic dissection, unchanged since 2013. Slight dilatation of the proximal descending thoracic aorta, 3.5 cm. -f/u with PCP   DVT prophylaxis: Lovenox Code Status: FULL  Family Communication: Daughter at bedside  Disposition Plan: SNF when bed available  Objective: Vitals:   03/16/16 2108 03/17/16 0033 03/17/16 0520 03/17/16 1353  BP: (!) 128/59  136/74 (!) 99/41  Pulse: (!) 102  (!) 112 86  Resp:  18 20 16   Temp: 98.6 F (37 C)  97.6 F (36.4 C) 97.7 F (36.5 C)  TempSrc: Oral  Oral Oral  SpO2: 98%  93% 95%  Weight: 65.1 kg (143 lb 8.3 oz)     Height: 5\' 3"  (1.6 m)       Intake/Output Summary (Last 24 hours) at 03/17/16 1516 Last data filed at 03/17/16 0645  Gross per 24 hour  Intake              240 ml  Output                3 ml  Net              237 ml   Filed Weights   03/16/16 1413 03/16/16 2108  Weight: 68 kg (150 lb) 65.1 kg (143 lb 8.3 oz)    Examination:  General exam: Appears calm and comfortable  HEENT: Neck tender to movement, but no stiffness, no decrease insensation  Respiratory  system: Clear to auscultation. No wheezes,crackle or rhonchi Cardiovascular system: S1 & S2 heard, RRR. No JVD, murmurs, rubs or gallops Gastrointestinal system: Abdomen is nondistended, soft and nontender. Normal bowel sounds heard. Central nervous system: Alert and oriented. No focal neurological deficits. Extremities: No pedal edema. Strength 5/5 , DTR normal  Skin: No rashes, lesions or ulcers Psychiatry: Judgement and insight appear normal. Mood & affect appropriate.    Data Reviewed: I have personally reviewed following labs and imaging studies  CBC:  Recent Labs Lab 03/16/16 1507 03/17/16 0225  WBC 8.8 9.3  NEUTROABS 7.1  --   HGB 14.3  12.9  HCT 42.5 39.2  MCV 85.0 87.3  PLT 314 [id number]   Basic Metabolic Panel:  Recent Labs Lab 03/16/16 1507 03/17/16 0225  NA 137 135  K 2.9* 3.9  CL 100* 101  CO2 25 24  GLUCOSE 81 132*  BUN 16 24*  CREATININE 0.78 0.91  CALCIUM 9.3 8.5*  MG  --  2.3   GFR: Estimated Creatinine Clearance: 45.5 mL/min (by C-G formula based on SCr of 0.91 mg/dL). Liver Function Tests:  Recent Labs Lab 03/16/16 1507  AST 20  ALT 12*  ALKPHOS 99  BILITOT 2.0*  PROT 7.7  ALBUMIN 4.2    Recent Labs Lab 03/16/16 2042  LIPASE 24   No results for input(s): AMMONIA in the last 168 hours. Coagulation Profile: No results for input(s): INR, PROTIME in the last 168 hours. Cardiac Enzymes:  Recent Labs Lab 03/16/16 2042 03/17/16 0225 03/17/16 0744 03/17/16 1343  CKTOTAL  --   --   --  78  TROPONINI <0.03 <0.03 <0.03  --    BNP (last 3 results) No results for input(s): PROBNP in the last 8760 hours. HbA1C: No results for input(s): HGBA1C in the last 72 hours. CBG:  Recent Labs Lab 03/16/16 2141 03/17/16 0813 03/17/16 1152  GLUCAP 90 91 137*   Lipid Profile: No results for input(s): CHOL, HDL, LDLCALC, TRIG, CHOLHDL, LDLDIRECT in the last 72 hours. Thyroid Function Tests: No results for input(s): TSH, T4TOTAL, FREET4, T3FREE, THYROIDAB in the last 72 hours. Anemia Panel: No results for input(s): VITAMINB12, FOLATE, FERRITIN, TIBC, IRON, RETICCTPCT in the last 72 hours. Sepsis Labs: No results for input(s): PROCALCITON, LATICACIDVEN in the last 168 hours.  No results found for this or any previous visit (from the past 240 hour(s)).    Radiology Studies: Ct Chest Wo Contrast  Result Date: 03/16/2016 CLINICAL DATA:  Chronic upper back pain.  No known injury. EXAM: CT CHEST WITHOUT CONTRAST TECHNIQUE: Multidetector CT imaging of the chest was performed following the standard protocol without IV contrast. COMPARISON:  Chest x-ray 09/30/2012.  Chest CT 05/15/2011 FINDINGS:  Cardiovascular: Heart is normal size. Diffusely calcified aorta. Proximal descending thoracic aorta 3.5 cm. There are displaced calcifications within the proximal descending thoracic aorta, likely reflecting chronic dissection. This is stable since 2013. Moderate coronary artery calcifications. Mediastinum/Nodes: Scattered small and borderline sized mediastinal lymph nodes, unchanged. No mediastinal, hilar, or axillary adenopathy. Lungs/Pleura: Mild centrilobular emphysema. No confluent airspace opacities. No pleural effusions. Upper Abdomen: Imaging into the upper abdomen shows no acute findings. Musculoskeletal: No acute bony abnormality or focal bone lesion. Prior vertebroplasty in the lower thoracic spine. IMPRESSION: Diffusely calcified aorta of with chronically displaced calcifications in the proximal descending thoracic aorta suggesting chronic dissection, unchanged since 2013. Slight dilatation of the proximal descending thoracic aorta, 3.5 cm. Coronary artery disease. Mild emphysema. Electronically Signed   By: Rolm Baptise  M.D.   On: 03/16/2016 15:36   Ct Cervical Spine Wo Contrast  Result Date: 03/16/2016 CLINICAL DATA:  Chronic cervical spine pain. EXAM: CT CERVICAL SPINE WITHOUT CONTRAST TECHNIQUE: Multidetector CT imaging of the cervical spine was performed without intravenous contrast. Multiplanar CT image reconstructions were also generated. COMPARISON:  None. FINDINGS: Alignment: Normal. Skull base and vertebrae: No acute fracture. No primary bone lesion or focal pathologic process. Soft tissues and spinal canal: No prevertebral fluid or swelling. No visible canal hematoma. Disc levels: C2-3:  Mild diffuse disc bulge with mild central spinal narrowing. C3-4: Mild to moderate degenerative disc disease, spondylosis, facet arthropathy and diffuse disc bulge contribute to mild central spinal narrowing and mild to moderate left bony foraminal narrowing. C4-5: Mild diffuse disc bulge and mild facet  arthropathy contribute to mild central spinal and mild left foraminal narrowing. C5-6: Moderate degenerative disc disease, spondylosis and facet arthropathy contribute to moderate central spinal and moderate bilateral bony foraminal narrowing. C6-7: Moderate degenerative disc disease, spondylosis and facet arthropathy contribute to mild to moderate central spinal and mild to moderate bilateral bony foraminal narrowing. Upper chest: Negative. Other: None. IMPRESSION: No evidence of acute abnormality. Multilevel degenerative changes contributing to central spinal and foraminal narrowing as described. Electronically Signed   By: Margarette Canada M.D.   On: 03/16/2016 15:42    Scheduled Meds: . amLODipine  10 mg Oral Daily  . atorvastatin  80 mg Oral Daily  . cholecalciferol  1,000 Units Oral Daily  . enoxaparin (LOVENOX) injection  40 mg Subcutaneous Q24H  . gabapentin  1,200 mg Oral QHS  . insulin aspart  0-5 Units Subcutaneous QHS  . insulin aspart  0-9 Units Subcutaneous TID WC  . loratadine  10 mg Oral Daily  . losartan  50 mg Oral Daily  . pantoprazole  40 mg Oral BID  . sodium chloride flush  3 mL Intravenous Q12H   Continuous Infusions:   LOS: 0 days    Chipper Oman, MD [facility] Pager [phone]  If 7PM-7AM, please contact night-coverage [email or web address] Password [email or web address] 03/17/2016, 3:16 PM

## 2016-03-17 NOTE — Clinical Social Work Note (Signed)
Clinical Social Work Assessment  Patient Details  Name: [name] MRN: [id number] Date of Birth: 27-Nov-1934  Date of referral:  03/17/16               Reason for consult:  Facility Placement                Permission sought to share information with:  Chartered certified accountant granted to share information::  Yes, Verbal Permission Granted  Name::        Agency::     Relationship::     Contact Information:     Housing/Transportation Living arrangements for the past 2 months:  Single Family Home Source of Information:  Patient Patient Interpreter Needed:  None Criminal Activity/Legal Involvement Pertinent to Current Situation/Hospitalization:  No - Comment as needed Significant Relationships:  Adult Children Lives with:  Self Do you feel safe going back to the place where you live?  No Need for family participation in patient care:  Yes (Comment)  Care giving concerns:  CSW reviewed PT evaluation recommending SNF at discharge.    Social Worker assessment / plan:  CSW met with patient's daughter at bedside as patient was sleeping. Patient's daughter informed CSW that patient had been to [facility] in the past but would prefer [facility], awaiting call back from [facility] re: bed offer.   Employment status:  Retired Nurse, adult PT Recommendations:  [place] / Referral to community resources:  [place]  Patient/Family's Response to care:    Patient/Family's Understanding of and Emotional Response to Diagnosis, Current Treatment, and Prognosis:    Emotional Assessment Appearance:  Appears stated age Attitude/Demeanor/Rapport:    Affect (typically observed):    Orientation:  Oriented to Self, Oriented to Place, Oriented to  Time, Oriented to Situation Alcohol / Substance use:    Psych involvement (Current and /or in the community):     Discharge Needs  Concerns to be  addressed:    Readmission within the last 30 days:    Current discharge risk:    Barriers to Discharge:      Standley Brooking, LCSW 03/17/2016, 12:29 PM

## 2016-03-17 NOTE — Evaluation (Signed)
Physical Therapy Evaluation Patient Details Name: KONDA MORITA MRN: [id number] DOB: 02-26-1934 Today's Date: 03/17/2016   History of Present Illness  Pt is a 82 yo female admitted with neck adn back pain that is unrelenting and new.  CT neck shoes degenerative disk disease.  Pt with h/o COPD, DM, pituitary tumor removal x2  Clinical Impression  On eval, pt required Mod assist for mobility. She walked ~15'x 2 in room. Pt was very unsteady. High risk for falls. Pt also appeared to be cognitively impaired. No family present during session to provide information on baseline cognitive status. At this time, recommendation is for ST rehab at [facility]. Pt lives alone and she is currently not safe to d/c home alone. Will follow and progress activity as tolerated.     Follow Up Recommendations SNF    Equipment Recommendations  None recommended by PT    Recommendations for Other Services OT consult     Precautions / Restrictions Precautions Precautions: Fall Precaution Comments: fall risk Restrictions Weight Bearing Restrictions: No      Mobility  Bed Mobility Overal bed mobility: Needs Assistance Bed Mobility: Sit to Supine      Sit to supine: Min assist;HOB elevated   General bed mobility comments: Assist for LEs.   Transfers Overall transfer level: Needs assistance Equipment used: Rolling walker (2 wheeled) Transfers: Sit to/from Stand Sit to Stand: Mod assist        General transfer comment: Assist to rise, stabilize, control descent. Pt is very unsteady and at high risk for falls. Increased time to get to full standing position. Cues for safety, technique, hand placement.   Ambulation/Gait Ambulation/Gait assistance: Mod assist;Min assist Ambulation Distance (Feet): 15 Feet (x2) Assistive device: Rolling walker (2 wheeled);1 person hand held assist Gait Pattern/deviations: Step-through pattern;Decreased stride length     General Gait Details: Walked x1 to bathroom  with 1 HHA. High fall risk. theraist provided 1 HHA however pt has to support herself by grabbing on to objects in environment with opposite hand.  Walked x1 from bathroom with use of RW. Assist still needed for stability and safe maneuvering of walker.   Stairs            Wheelchair Mobility    Modified Rankin (Stroke Patients Only)       Balance Overall balance assessment: Needs assistance Sitting-balance support: Feet supported;Bilateral upper extremity supported Sitting balance-Leahy Scale: Fair Sitting balance - Comments: posterior lean Postural control: Posterior lean Standing balance support: During functional activity Standing balance-Leahy Scale: Poor Standing balance comment: Pt requires significant outside support to remain standing.                             Pertinent Vitals/Pain Pain Assessment: Faces Faces Pain Scale: Hurts little more Pain Location: neck Pain Descriptors / Indicators: Aching;Sharp Pain Intervention(s): Limited activity within patient's tolerance;Repositioned    Home Living Family/patient expects to be discharged to:: Private residence Living Arrangements: Alone Available Help at Discharge: Available PRN/intermittently Type of Home: House Home Access: Stairs to enter   Technical brewer of Steps: "several" Home Layout: Two level Home Equipment: None Additional Comments: Pt gave history and was lethargic and unreliable as she was not oriented and mental status is not at baseline which is normal.    Prior Function Level of Independence: Independent         Comments: Pt at baseline is fully indpendent, drives, cares for self.  Hand Dominance   Dominant Hand: Right    Extremity/Trunk Assessment   Upper Extremity Assessment Upper Extremity Assessment: Defer to OT evaluation    Lower Extremity Assessment Lower Extremity Assessment: Generalized weakness    Cervical / Trunk Assessment Cervical / Trunk  Assessment: Other exceptions Cervical / Trunk Exceptions: Pt reports pain in cervical and thorasic regions.  Communication   Communication: Other (comment) (pt lethargic. speech slurred)  Cognition Arousal/Alertness: Lethargic Behavior During Therapy: Flat affect Overall Cognitive Status: Impaired/Different from baseline Area of Impairment: Orientation;Attention;Memory;Safety/judgement;Awareness;Problem solving Orientation Level: Disoriented to;Time;Situation Current Attention Level: Focused Memory: Decreased recall of precautions;Decreased short-term memory   Safety/Judgement: Decreased awareness of safety;Decreased awareness of deficits Awareness: Intellectual Problem Solving: Slow processing;Decreased initiation;Requires verbal cues;Requires tactile cues General Comments: Tangential responses to some questions.     General Comments      Exercises     Assessment/Plan    PT Assessment Patient needs continued PT services  PT Problem List Decreased strength;Decreased mobility;Decreased activity tolerance;Decreased balance;Pain;Decreased knowledge of use of DME;Decreased cognition          PT Treatment Interventions DME instruction;Therapeutic activities;Gait training;Therapeutic exercise;Patient/family education;Functional mobility training;Balance training    PT Goals (Current goals can be found in the Care Plan section)  Acute Rehab PT Goals Patient Stated Goal: to feel better PT Goal Formulation: With patient Time For Goal Achievement: 03/31/16 Potential to Achieve Goals: Good    Frequency Min 3X/week   Barriers to discharge Decreased caregiver support      Co-evaluation               End of Session Equipment Utilized During Treatment: Gait belt Activity Tolerance: Patient limited by fatigue;Patient limited by pain Patient left: in bed;with call bell/phone within reach;with bed alarm set      Functional Assessment Tool Used: clinical  judgement Functional Limitation: Mobility: Walking and moving around Mobility: Walking and Moving Around Current Status [id number]): At least 20 percent but less than 40 percent impaired, limited or restricted Mobility: Walking and Moving Around Goal Status [phone]): At least 1 percent but less than 20 percent impaired, limited or restricted    Time: [id number] PT Time Calculation (min) (ACUTE ONLY): 13 min   Charges:   PT Evaluation $PT Eval Low Complexity: 1 Procedure     PT G Codes:   PT G-Codes **NOT FOR INPATIENT CLASS** Functional Assessment Tool Used: clinical judgement Functional Limitation: Mobility: Walking and moving around Mobility: Walking and Moving Around Current Status [id number]): At least 20 percent but less than 40 percent impaired, limited or restricted Mobility: Walking and Moving Around Goal Status [phone]): At least 1 percent but less than 20 percent impaired, limited or restricted    Weston Anna, MPT Pager: [phone]

## 2016-03-17 NOTE — Clinical Social Work Placement (Signed)
   CLINICAL SOCIAL WORK PLACEMENT  NOTE  Date:  03/17/2016  Patient Details  Name: [name] MRN: [id number] Date of Birth: 1934-07-05  Clinical Social Work is seeking post-discharge placement for this patient at the [facility] level of care (*CSW will initial, date and re-position this form in  chart as items are completed):  Yes   Patient/family provided with [facility] Work Department's list of facilities offering this level of care within the geographic area requested by the patient (or if unable, by the patient's family).  Yes   Patient/family informed of their freedom to choose among providers that offer the needed level of care, that participate in Medicare, Medicaid or managed care program needed by the patient, have an available bed and are willing to accept the patient.  Yes   Patient/family informed of [facility]'s ownership interest in [facility] and [facility], as well as of the fact that they are under no obligation to receive care at these facilities.  PASRR submitted to EDS on       PASRR number received on       Existing PASRR number confirmed on 03/17/16     FL2 transmitted to all facilities in geographic area requested by pt/family on 03/17/16     FL2 transmitted to all facilities within larger geographic area on       Patient informed that his/her managed care company has contracts with or will negotiate with certain facilities, including the following:            Patient/family informed of bed offers received.  Patient chooses bed at       Physician recommends and patient chooses bed at      Patient to be transferred to   on  .  Patient to be transferred to facility by       Patient family notified on   of transfer.  Name of family member notified:        PHYSICIAN       Additional Comment:    _______________________________________________ Standley Brooking, LCSW 03/17/2016, 12:31 PM

## 2016-03-17 NOTE — Progress Notes (Signed)
Patient has not voided. Bladder scan revealed 1100mL of urine in bladder, will continue to monitor.  Barbee Shropshire. Brigitte Pulse, RN

## 2016-03-17 NOTE — Progress Notes (Signed)
Patient without urine output this shift. Patient has poor po intake. MD notified, new orders for IV fluids received.  Barbee Shropshire. Brigitte Pulse, RN
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
## 2016-03-18 DIAGNOSIS — J449 Chronic obstructive pulmonary disease, unspecified: Secondary | ICD-10-CM

## 2016-03-18 DIAGNOSIS — M542 Cervicalgia: Secondary | ICD-10-CM | POA: Diagnosis not present

## 2016-03-18 DIAGNOSIS — E119 Type 2 diabetes mellitus without complications: Secondary | ICD-10-CM

## 2016-03-18 DIAGNOSIS — R0789 Other chest pain: Secondary | ICD-10-CM | POA: Diagnosis not present

## 2016-03-18 LAB — URINALYSIS, ROUTINE W REFLEX MICROSCOPIC
BILIRUBIN URINE: NEGATIVE
Bacteria, UA: NONE SEEN
Glucose, UA: NEGATIVE mg/dL
HGB URINE DIPSTICK: NEGATIVE
Ketones, ur: NEGATIVE mg/dL
Nitrite: NEGATIVE
PROTEIN: NEGATIVE mg/dL
SPECIFIC GRAVITY, URINE: 1.013 (ref 1.005–1.030)
pH: 5 (ref 5.0–8.0)

## 2016-03-18 LAB — GLUCOSE, CAPILLARY
GLUCOSE-CAPILLARY: 100 mg/dL — AB (ref 65–99)
GLUCOSE-CAPILLARY: 114 mg/dL — AB (ref 65–99)
GLUCOSE-CAPILLARY: 154 mg/dL — AB (ref 65–99)

## 2016-03-18 MED ORDER — ARTIFICIAL TEARS OP OINT
TOPICAL_OINTMENT | Freq: Three times a day (TID) | OPHTHALMIC | Status: DC | PRN
  Filled 2016-03-18: qty 3.5

## 2016-03-18 MED ORDER — HYPROMELLOSE (GONIOSCOPIC) 2.5 % OP SOLN: 1.0000 [drp] | OPHTHALMIC | Status: DC | PRN

## 2016-03-18 MED ORDER — GABAPENTIN 300 MG PO CAPS: 300.0000 mg | ORAL_CAPSULE | Freq: Two times a day (BID) | ORAL | 0 refills | Status: DC

## 2016-03-18 MED ORDER — IBUPROFEN 400 MG PO TABS: 400.0000 mg | ORAL_TABLET | Freq: Four times a day (QID) | ORAL | 0 refills | Status: DC | PRN

## 2016-03-18 MED ORDER — POLYVINYL ALCOHOL 1.4 % OP SOLN
1.0000 [drp] | OPHTHALMIC | Status: DC | PRN
  Filled 2016-03-18: qty 15

## 2016-03-18 MED ORDER — ACETAMINOPHEN 500 MG PO TABS: 1000.0000 mg | ORAL_TABLET | Freq: Four times a day (QID) | ORAL | Status: DC | PRN

## 2016-03-18 NOTE — Discharge Summary (Addendum)
Physician Discharge Summary  BEE ROTI  [id number]  DOB: 1934/12/07  DOA: 03/16/2016 PCP: [name], MD  Admit date: 03/16/2016 Discharge date: 03/18/2016  Admitted From: Home  Disposition:  SNF   Recommendations for Outpatient Follow-up:  1. Follow up with PCP in 1-2 weeks 2. Please obtain BMP/CBC in one week  Equipment/Devices: CPAP/O2 2L [place] qHS    Discharge Condition: Stable  CODE STATUS: FULL  Diet recommendation: Heart Healthy   Brief/Interim Summary: [name] a 82 y.o.femalewith medical history significant of hypertension, hyperlipidemia, diabetes mellitus, COPD, GERD, OSA on CPAP, pseudogout, s/p of pituitary removal, chronic lower back pain, who presented to the ED with neck pain, back pain, nausea, vomiting, chest wall pain.  Patient reports her neck has been bothering her for the past couple of weeks. She reports that has been having troubles walking around and doing her daily activities. Denies any extremity numbness or tingling sensation.  Daughter reported that neighbors call her because the patient was not picking up her newspapers. When she call her mother, she reported that she could not get out of the bed and was having extreme pain and was brought to the ED CT neck and chest was done in the ED which show chronic changes with mild foraminal narrowing.  Subjective:   Discharge Diagnoses/Hospital Course:  Chronic Neck pain:CT-C spin showed no evidence of acute abnormality, but has multilevel degenerative changes contributing to central spinal and foraminal narrowing. Pt does not have alarming symptoms, no arm weakness or numbness. Pain med PRN  High dose of gabapentin - will cut dose in half as patient is getting sedated with this medication  Gabapentin 300 mg BID  PT evaluation recommended SNF Per daughter patient has been deconditioning for the past few month - this has happened before Neurosurgery/Ortho eval as  outpatient  OSA: CPAP qHS  DM-II:Last A1c 6.2 on 01/07/15, well controled with diet  Continue diet control treatment - Check A1C - CBGs stable during hospital stay  Carb modified diet  Follow up with PCP   Nausea and vomiting: due to medication - resolved   Hypokalemia: K= 2.9on admission. - resolved   HTN: - stable  Continue amlodipine, losartan  GERD: Continue Protonix  COPD:stable Albuterol PRN   Chest wall pain:- costochondritis  Rhabdo ruled out - CK normal  Tylenol 1000 mg q 6 PRN   Abnormal findings on CT-chest without contrast: it showed diffusely calcified aorta of with chronically displaced calcifications in the proximal descending thoracic aorta suggesting chronic dissection, unchanged since 2013. Slight dilatation of the proximal descending thoracic aorta, 3.5 cm. -f/u with PCP  Discharge Instructions  You were cared for by a hospitalist during your hospital stay. If you have any questions about your discharge medications or the care you received while you were in the hospital after you are discharged, you can call the unit and asked to speak with the hospitalist on call if the hospitalist that took care of you is not available. Once you are discharged, your primary care physician will handle any further medical issues. Please note that NO REFILLS for any discharge medications will be authorized once you are discharged, as it is imperative that you return to your primary care physician (or establish a relationship with a primary care physician if you do not have one) for your aftercare needs so that they can reassess your need for medications and monitor your lab values.  Discharge Instructions    Call MD for:  difficulty  breathing, headache or visual disturbances    Complete by:  As directed    Call MD for:  extreme fatigue    Complete by:  As directed    Call MD for:  hives    Complete by:  As directed    Call MD for:  persistant dizziness or  light-headedness    Complete by:  As directed    Call MD for:  persistant nausea and vomiting    Complete by:  As directed    Call MD for:  redness, tenderness, or signs of infection (pain, swelling, redness, odor or green/yellow discharge around incision site)    Complete by:  As directed    Call MD for:  severe uncontrolled pain    Complete by:  As directed    Call MD for:  temperature >100.4    Complete by:  As directed    Diet - low sodium heart healthy    Complete by:  As directed    Increase activity slowly    Complete by:  As directed      Allergies as of 03/18/2016      Reactions   Valacyclovir Hcl Rash      Medication List    STOP taking these medications   gabapentin 600 MG tablet Commonly known as:  NEURONTIN Replaced by:  gabapentin 300 MG capsule   insulin aspart 100 UNIT/ML injection Commonly known as:  NOVOLOG     TAKE these medications   acetaminophen 500 MG tablet Commonly known as:  TYLENOL Take 2 tablets (1,000 mg total) by mouth every 6 (six) hours as needed for mild pain (or Fever >/= 101).   albuterol (2.5 MG/3ML) 0.083% nebulizer solution Commonly known as:  PROVENTIL Take 3 mLs (2.5 mg total) by nebulization every 6 (six) hours as needed for wheezing or shortness of breath.   amLODipine 10 MG tablet Commonly known as:  NORVASC Take 10 mg by mouth daily.   atorvastatin 80 MG tablet Commonly known as:  LIPITOR Take 80 mg by mouth daily.   bismuth subsalicylate 99991111 MG chewable tablet Commonly known as:  PEPTO BISMOL Chew 524 mg by mouth as needed.   cholecalciferol 1000 units tablet Commonly known as:  VITAMIN D Take 1,000 Units by mouth daily.   fexofenadine 180 MG tablet Commonly known as:  ALLEGRA Take 180 mg by mouth daily.   gabapentin 300 MG capsule Commonly known as:  NEURONTIN Take 1 capsule (300 mg total) by mouth 2 (two) times daily. Replaces:  gabapentin 600 MG tablet   IMODIUM A-D PO Take 1 tablet by mouth as needed  (loose stools).   losartan 50 MG tablet Commonly known as:  COZAAR Take 50 mg by mouth daily.   pantoprazole 40 MG tablet Commonly known as:  PROTONIX Take 40 mg by mouth 2 (two) times daily.   potassium chloride 10 MEQ CR capsule Commonly known as:  MICRO-K Take 10 mEq by mouth daily.   traMADol 50 MG tablet Commonly known as:  ULTRAM Take 50 mg by mouth 2 (two) times daily.      Contact information for after-discharge care    Destination    [facility] .   Specialty:  Skilled Nursing Facility Contact information: [place] [address] [phone]             Allergies  Allergen Reactions  . Valacyclovir Hcl Rash    Consultations:  None    Procedures/Studies: Ct Chest Wo  Contrast  Result Date: 03/16/2016 CLINICAL DATA:  Chronic upper back pain.  No known injury. EXAM: CT CHEST WITHOUT CONTRAST TECHNIQUE: Multidetector CT imaging of the chest was performed following the standard protocol without IV contrast. COMPARISON:  Chest x-ray 09/30/2012.  Chest CT 05/15/2011 FINDINGS: Cardiovascular: Heart is normal size. Diffusely calcified aorta. Proximal descending thoracic aorta 3.5 cm. There are displaced calcifications within the proximal descending thoracic aorta, likely reflecting chronic dissection. This is stable since 2013. Moderate coronary artery calcifications. Mediastinum/Nodes: Scattered small and borderline sized mediastinal lymph nodes, unchanged. No mediastinal, hilar, or axillary adenopathy. Lungs/Pleura: Mild centrilobular emphysema. No confluent airspace opacities. No pleural effusions. Upper Abdomen: Imaging into the upper abdomen shows no acute findings. Musculoskeletal: No acute bony abnormality or focal bone lesion. Prior vertebroplasty in the lower thoracic spine. IMPRESSION: Diffusely calcified aorta of with chronically displaced calcifications in the proximal descending thoracic aorta suggesting chronic dissection,  unchanged since 2013. Slight dilatation of the proximal descending thoracic aorta, 3.5 cm. Coronary artery disease. Mild emphysema. Electronically Signed   By: Rolm Baptise M.D.   On: 03/16/2016 15:36   Ct Cervical Spine Wo Contrast  Result Date: 03/16/2016 CLINICAL DATA:  Chronic cervical spine pain. EXAM: CT CERVICAL SPINE WITHOUT CONTRAST TECHNIQUE: Multidetector CT imaging of the cervical spine was performed without intravenous contrast. Multiplanar CT image reconstructions were also generated. COMPARISON:  None. FINDINGS: Alignment: Normal. Skull base and vertebrae: No acute fracture. No primary bone lesion or focal pathologic process. Soft tissues and spinal canal: No prevertebral fluid or swelling. No visible canal hematoma. Disc levels: C2-3:  Mild diffuse disc bulge with mild central spinal narrowing. C3-4: Mild to moderate degenerative disc disease, spondylosis, facet arthropathy and diffuse disc bulge contribute to mild central spinal narrowing and mild to moderate left bony foraminal narrowing. C4-5: Mild diffuse disc bulge and mild facet arthropathy contribute to mild central spinal and mild left foraminal narrowing. C5-6: Moderate degenerative disc disease, spondylosis and facet arthropathy contribute to moderate central spinal and moderate bilateral bony foraminal narrowing. C6-7: Moderate degenerative disc disease, spondylosis and facet arthropathy contribute to mild to moderate central spinal and mild to moderate bilateral bony foraminal narrowing. Upper chest: Negative. Other: None. IMPRESSION: No evidence of acute abnormality. Multilevel degenerative changes contributing to central spinal and foraminal narrowing as described. Electronically Signed   By: Margarette Canada M.D.   On: 03/16/2016 15:42     Discharge Exam: Vitals:   03/18/16 0939 03/18/16 1105  BP: (!) 114/56 (!) 114/56  Pulse:  (!) 107  Resp:    Temp:  98.2 F (36.8 C)   Vitals:   03/17/16 2146 03/18/16 0500 03/18/16 0939  03/18/16 1105  BP: (!) 95/44 118/72 (!) 114/56 (!) 114/56  Pulse: 84 79  (!) 107  Resp: 18 16    Temp: 98.7 F (37.1 C) 99 F (37.2 C)  98.2 F (36.8 C)  TempSrc:  Oral  Oral  SpO2: 94% 94%  98%  Weight:    65.1 kg (143 lb 8.3 oz)  Height:    5\' 3"  (1.6 m)    General: Pt is alert, awake, not in acute distress Cardiovascular: RRR, S1/S2 +, no rubs, no gallops Respiratory: CTA bilaterally, no wheezing, no rhonchi Abdominal: Soft, NT, ND, bowel sounds + Extremities: no edema, no cyanosis   The results of significant diagnostics from this hospitalization (including imaging, microbiology, ancillary and laboratory) are listed below for reference.     Microbiology: No results found for this or any previous  visit (from the past 240 hour(s)).   Labs: BNP (last 3 results) No results for input(s): BNP in the last 8760 hours. Basic Metabolic Panel:  Recent Labs Lab 03/16/16 1507 03/17/16 0225  NA 137 135  K 2.9* 3.9  CL 100* 101  CO2 25 24  GLUCOSE 81 132*  BUN 16 24*  CREATININE 0.78 0.91  CALCIUM 9.3 8.5*  MG  --  2.3   Liver Function Tests:  Recent Labs Lab 03/16/16 1507  AST 20  ALT 12*  ALKPHOS 99  BILITOT 2.0*  PROT 7.7  ALBUMIN 4.2    Recent Labs Lab 03/16/16 2042  LIPASE 24   No results for input(s): AMMONIA in the last 168 hours. CBC:  Recent Labs Lab 03/16/16 1507 03/17/16 0225  WBC 8.8 9.3  NEUTROABS 7.1  --   HGB 14.3 12.9  HCT 42.5 39.2  MCV 85.0 87.3  PLT 314 319   Cardiac Enzymes:  Recent Labs Lab 03/16/16 2042 03/17/16 0225 03/17/16 0744 03/17/16 1343  CKTOTAL  --   --   --  58  TROPONINI <0.03 <0.03 <0.03  --    BNP: Invalid input(s): POCBNP CBG:  Recent Labs Lab 03/17/16 1656 03/17/16 2143 03/18/16 0720 03/18/16 0756 03/18/16 1204  GLUCAP 109* 139* 100* 114* 154*   D-Dimer No results for input(s): DDIMER in the last 72 hours. Hgb A1c No results for input(s): HGBA1C in the last 72 hours. Lipid Profile No  results for input(s): CHOL, HDL, LDLCALC, TRIG, CHOLHDL, LDLDIRECT in the last 72 hours. Thyroid function studies No results for input(s): TSH, T4TOTAL, T3FREE, THYROIDAB in the last 72 hours.  Invalid input(s): FREET3 Anemia work up No results for input(s): VITAMINB12, FOLATE, FERRITIN, TIBC, IRON, RETICCTPCT in the last 72 hours. Urinalysis    Component Value Date/Time   COLORURINE YELLOW 03/18/2016 0516   APPEARANCEUR CLEAR 03/18/2016 0516   LABSPEC 1.013 03/18/2016 0516   PHURINE 5.0 03/18/2016 0516   GLUCOSEU NEGATIVE 03/18/2016 0516   HGBUR NEGATIVE 03/18/2016 0516   BILIRUBINUR NEGATIVE 03/18/2016 0516   KETONESUR NEGATIVE 03/18/2016 0516   PROTEINUR NEGATIVE 03/18/2016 0516   NITRITE NEGATIVE 03/18/2016 0516   LEUKOCYTESUR SMALL (A) 03/18/2016 0516   Sepsis Labs Invalid input(s): PROCALCITONIN,  WBC,  LACTICIDVEN Microbiology No results found for this or any previous visit (from the past 240 hour(s)).   Time coordinating discharge: 35 minutes  SIGNED:  Chipper Oman, MD  [facility] 03/18/2016, 2:21 PM Pager   If 7PM-7AM, please contact night-coverage [email or web address] Password [email or web address]
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
## 2016-03-18 NOTE — Clinical Social Work Placement (Signed)
Patient is set to discharge to [facility] today. Patient & daughter, [name] at bedside aware. Discharge packet given to RN, Sophia. [facility] called for transport.     Raynaldo Opitz, [facility] Clinical Social Worker cell #: [phone]    CLINICAL SOCIAL WORK PLACEMENT  NOTE  Date:  03/18/2016  Patient Details  Name: [name] MRN: [id number] Date of Birth: Jun 26, 1934  Clinical Social Work is seeking post-discharge placement for this patient at the [facility] level of care (*CSW will initial, date and re-position this form in  chart as items are completed):  Yes   Patient/family provided with [facility] Work Department's list of facilities offering this level of care within the geographic area requested by the patient (or if unable, by the patient's family).  Yes   Patient/family informed of their freedom to choose among providers that offer the needed level of care, that participate in Medicare, Medicaid or managed care program needed by the patient, have an available bed and are willing to accept the patient.  Yes   Patient/family informed of [facility]'s ownership interest in [facility] and [facility], as well as of the fact that they are under no obligation to receive care at these facilities.  PASRR submitted to EDS on       PASRR number received on       Existing PASRR number confirmed on 03/17/16     FL2 transmitted to all facilities in geographic area requested by pt/family on 03/17/16     FL2 transmitted to all facilities within larger geographic area on       Patient informed that his/her managed care company has contracts with or will negotiate with certain facilities, including the following:        Yes   Patient/family informed of bed offers received.  Patient chooses bed at [facility]     Physician recommends and patient chooses bed at      Patient to be transferred to [facility] on 03/18/16.  Patient to be transferred to facility by [facility]     Patient family notified on 03/18/16 of transfer.  Name of family member notified:  patient's daughter, [name] at bedside     PHYSICIAN       Additional Comment:    _______________________________________________ Standley Brooking, LCSW 03/18/2016, 2:19 PM

## 2016-03-19 ENCOUNTER — Encounter: Payer: Self-pay | Admitting: Adult Health

## 2016-03-19 ENCOUNTER — Non-Acute Institutional Stay (SKILLED_NURSING_FACILITY): Payer: Medicare Other | Admitting: Adult Health

## 2016-03-19 DIAGNOSIS — R531 Weakness: Secondary | ICD-10-CM

## 2016-03-19 DIAGNOSIS — E119 Type 2 diabetes mellitus without complications: Secondary | ICD-10-CM | POA: Diagnosis not present

## 2016-03-19 DIAGNOSIS — I10 Essential (primary) hypertension: Secondary | ICD-10-CM

## 2016-03-19 DIAGNOSIS — E876 Hypokalemia: Secondary | ICD-10-CM | POA: Diagnosis not present

## 2016-03-19 DIAGNOSIS — R0789 Other chest pain: Secondary | ICD-10-CM | POA: Diagnosis not present

## 2016-03-19 DIAGNOSIS — G4733 Obstructive sleep apnea (adult) (pediatric): Secondary | ICD-10-CM | POA: Diagnosis not present

## 2016-03-19 DIAGNOSIS — M542 Cervicalgia: Secondary | ICD-10-CM

## 2016-03-19 DIAGNOSIS — J309 Allergic rhinitis, unspecified: Secondary | ICD-10-CM

## 2016-03-19 DIAGNOSIS — G8929 Other chronic pain: Secondary | ICD-10-CM | POA: Diagnosis not present

## 2016-03-19 DIAGNOSIS — J449 Chronic obstructive pulmonary disease, unspecified: Secondary | ICD-10-CM | POA: Diagnosis not present

## 2016-03-19 DIAGNOSIS — K219 Gastro-esophageal reflux disease without esophagitis: Secondary | ICD-10-CM

## 2016-03-19 NOTE — Progress Notes (Signed)
DATE:  03/19/2016   MRN:  [id number]  BIRTHDAY: 02-29-1934  Facility:  Nursing Home Location:  [place] Room Number: 1003-B  LEVEL OF CARE:  SNF (31)  Contact Information    Name Relation Home Work [place] Daughter [phone]  [phone]       Code Status History    Date Active Date Inactive Code Status Order ID Comments User Context   03/16/2016  8:16 PM 03/18/2016  6:27 PM Full Code [id number]  [name], MD ED   01/06/2015  4:54 AM 01/09/2015  4:13 PM Full Code [id number]  [name], MD Inpatient   10/01/2012  6:22 AM 10/05/2012 10:19 PM Full Code [id number]  [name], MD Inpatient       Chief Complaint  Patient presents with  . Medical Management of Chronic Issues    HISTORY OF PRESENT ILLNESS:  This is a 89-YO female seen for hospital follow-up.  She was admitted to [place] on 03/18/2016 following an admission at [facility] 03/16/2016-03/18/2016 with complaints of neck pain, back pain, nausea, vomiting, and chest wall pain.  She had been unable to get out of bed due to extreme pain.  CT of the neck and chest performed in the ED revealed chronic changes with mild foraminal narrowing. Gabapentin dose was cut in half due to sedation. She had hypokalemia and now resolved.   She was seen in the room with therapy at bedside. She did not verbalized any concerns.    PAST MEDICAL HISTORY:  Past Medical History:  Diagnosis Date  . Allergic rhinitis   . Arthritis   . Complication of anesthesia    Hard time waking up  . COPD (chronic obstructive pulmonary disease) ([place])    PFT 08/08/09-FEV1 1.98/107; R 0.77; small airway obst w/tresp to dilator;DLCO 44%  . Diabetes mellitus   . Fibromuscular dysplasia (HCC)    RAS  . Hyperlipidemia   . Hypertension   . PONV (postoperative nausea and vomiting)   . Pseudogout   . Sleep apnea    AHI 89/hr     CURRENT MEDICATIONS: Reviewed  Patient's  Medications  New Prescriptions   No medications on file  Previous Medications   ACETAMINOPHEN (TYLENOL) 500 MG TABLET    Take 2 tablets (1,000 mg total) by mouth every 6 (six) hours as needed for mild pain (or Fever >/= 101).   ALBUTEROL (PROVENTIL) (2.5 MG/3ML) 0.083% NEBULIZER SOLUTION    Take 3 mLs (2.5 mg total) by nebulization every 6 (six) hours as needed for wheezing or shortness of breath.   AMLODIPINE (NORVASC) 10 MG TABLET    Take 10 mg by mouth daily.    ATORVASTATIN (LIPITOR) 80 MG TABLET    Take 80 mg by mouth daily.     BISMUTH SUBSALICYLATE (PEPTO BISMOL) 262 MG CHEWABLE TABLET    Chew 524 mg by mouth as needed.   CHOLECALCIFEROL (VITAMIN D) 1000 UNITS TABLET    Take 1,000 Units by mouth daily.   FEXOFENADINE (ALLEGRA) 180 MG TABLET    Take 180 mg by mouth daily.   GABAPENTIN (NEURONTIN) 300 MG CAPSULE    Take 1 capsule (300 mg total) by mouth 2 (two) times daily.   LOPERAMIDE HCL (IMODIUM A-D PO)    Take 2 mg by mouth as needed (loose stools).    LOSARTAN (COZAAR) 50 MG TABLET    Take 50 mg by mouth daily.  PANTOPRAZOLE (PROTONIX) 40 MG TABLET    Take 40 mg by mouth 2 (two) times daily.   POTASSIUM CHLORIDE (MICRO-K) 10 MEQ CR CAPSULE    Take 10 mEq by mouth daily.   TRAMADOL (ULTRAM) 50 MG TABLET    Take 50 mg by mouth 2 (two) times daily.  Modified Medications   No medications on file  Discontinued Medications   No medications on file     Allergies  Allergen Reactions  . Valacyclovir Hcl Rash     REVIEW OF SYSTEMS:  GENERAL: no change in appetite, no fatigue, no weight changes, no fever, chills or weakness EYES: Denies change in vision, dry eyes, eye pain, itching or discharge EARS: Denies change in hearing, ringing in ears, or earache NOSE: Denies nasal congestion or epistaxis MOUTH and THROAT: Denies oral discomfort, gingival pain or bleeding, pain from teeth or hoarseness   RESPIRATORY: no cough, SOB, DOE, wheezing, hemoptysis CARDIAC: no chest pain, edema  or palpitations GI: no abdominal pain, diarrhea, constipation, heart burn, nausea or vomiting GU: Denies dysuria, frequency, hematuria, incontinence, or discharge PSYCHIATRIC: Denies feeling of depression or anxiety. No report of hallucinations, insomnia, paranoia, or agitation    PHYSICAL EXAMINATION  GENERAL APPEARANCE: Well nourished. In no acute distress. Normal body habitus SKIN:  Skin is warm and dry.  HEAD: Normal in size and contour. No evidence of trauma EYES: Lids open and close normally. No blepharitis, entropion or ectropion. PERRL. Conjunctivae are clear and sclerae are white. Lenses are without opacity EARS: Pinnae are normal. Patient hears normal voice tunes of the examiner MOUTH and THROAT: Lips are without lesions. Oral mucosa is moist and without lesions. Tongue is normal in shape, size, and color and without lesions NECK: supple, trachea midline, no neck masses, no thyroid tenderness, no thyromegaly LYMPHATICS: no LAN in the neck, no supraclavicular LAN RESPIRATORY: breathing is even & unlabored, BS CTAB, has O2 @ 2L/min via [place] CARDIAC: RRR, + murmur,no extra heart sounds, no edema GI: abdomen soft, normal BS, no masses, no tenderness, no hepatomegaly, no splenomegaly EXTREMITIES:  Able to move X 4 extremities PSYCHIATRIC: Alert and oriented X 3. Affect and behavior are appropriate    LABS/RADIOLOGY: Labs reviewed: Basic Metabolic Panel:  Recent Labs  03/16/16 1507 03/17/16 0225  NA 137 135  K 2.9* 3.9  CL 100* 101  CO2 25 24  GLUCOSE 81 132*  BUN 16 24*  CREATININE 0.78 0.91  CALCIUM 9.3 8.5*  MG  --  2.3   Liver Function Tests:  Recent Labs  03/16/16 1507  AST 20  ALT 12*  ALKPHOS 99  BILITOT 2.0*  PROT 7.7  ALBUMIN 4.2    Recent Labs  03/16/16 2042  LIPASE 24   CBC:  Recent Labs  03/16/16 1507 03/17/16 0225  WBC 8.8 9.3  NEUTROABS 7.1  --   HGB 14.3 12.9  HCT 42.5 39.2  MCV 85.0 87.3  PLT 314 319   Cardiac  Enzymes:  Recent Labs  03/16/16 2042 03/17/16 0225 03/17/16 0744 03/17/16 1343  CKTOTAL  --   --   --  58  TROPONINI <0.03 <0.03 <0.03  --    CBG:  Recent Labs  03/18/16 0720 03/18/16 0756 03/18/16 1204  GLUCAP 100* 114* 154*      Ct Chest Wo Contrast  Result Date: 03/16/2016 CLINICAL DATA:  Chronic upper back pain.  No known injury. EXAM: CT CHEST WITHOUT CONTRAST TECHNIQUE: Multidetector CT imaging of the chest was performed following the  standard protocol without IV contrast. COMPARISON:  Chest x-ray 09/30/2012.  Chest CT 05/15/2011 FINDINGS: Cardiovascular: Heart is normal size. Diffusely calcified aorta. Proximal descending thoracic aorta 3.5 cm. There are displaced calcifications within the proximal descending thoracic aorta, likely reflecting chronic dissection. This is stable since 2013. Moderate coronary artery calcifications. Mediastinum/Nodes: Scattered small and borderline sized mediastinal lymph nodes, unchanged. No mediastinal, hilar, or axillary adenopathy. Lungs/Pleura: Mild centrilobular emphysema. No confluent airspace opacities. No pleural effusions. Upper Abdomen: Imaging into the upper abdomen shows no acute findings. Musculoskeletal: No acute bony abnormality or focal bone lesion. Prior vertebroplasty in the lower thoracic spine. IMPRESSION: Diffusely calcified aorta of with chronically displaced calcifications in the proximal descending thoracic aorta suggesting chronic dissection, unchanged since 2013. Slight dilatation of the proximal descending thoracic aorta, 3.5 cm. Coronary artery disease. Mild emphysema. Electronically Signed   By: Rolm Baptise M.D.   On: 03/16/2016 15:36   Ct Cervical Spine Wo Contrast  Result Date: 03/16/2016 CLINICAL DATA:  Chronic cervical spine pain. EXAM: CT CERVICAL SPINE WITHOUT CONTRAST TECHNIQUE: Multidetector CT imaging of the cervical spine was performed without intravenous contrast. Multiplanar CT image reconstructions were also  generated. COMPARISON:  None. FINDINGS: Alignment: Normal. Skull base and vertebrae: No acute fracture. No primary bone lesion or focal pathologic process. Soft tissues and spinal canal: No prevertebral fluid or swelling. No visible canal hematoma. Disc levels: C2-3:  Mild diffuse disc bulge with mild central spinal narrowing. C3-4: Mild to moderate degenerative disc disease, spondylosis, facet arthropathy and diffuse disc bulge contribute to mild central spinal narrowing and mild to moderate left bony foraminal narrowing. C4-5: Mild diffuse disc bulge and mild facet arthropathy contribute to mild central spinal and mild left foraminal narrowing. C5-6: Moderate degenerative disc disease, spondylosis and facet arthropathy contribute to moderate central spinal and moderate bilateral bony foraminal narrowing. C6-7: Moderate degenerative disc disease, spondylosis and facet arthropathy contribute to mild to moderate central spinal and mild to moderate bilateral bony foraminal narrowing. Upper chest: Negative. Other: None. IMPRESSION: No evidence of acute abnormality. Multilevel degenerative changes contributing to central spinal and foraminal narrowing as described. Electronically Signed   By: Margarette Canada M.D.   On: 03/16/2016 15:42    ASSESSMENT/PLAN:  Generalized weakness - for rehabilitation, PT and OT, for therapeutic strengthening exercises; fall precautions  Chronic neck pain - CT C-spine showed no evidence of acute abnormality but has multilevel degenerative changes contributing to central spine and foraminal narrowing; gabapentin dose was decreased in how as patient is getting sedated, will continue gabapentin 300 mg by mouth twice a day; follow-up with neurosurgeon  Hypokalemia - continue KCl ER 10 meq 1 capsule by mouth daily; check BMP  OSA - CPAP at at bedtime  Diabetes mellitus, type II - diet controlled; check hemoglobin A1c  Hypertension - continue Norvasc 10 mg 1 tab by mouth daily and  Cozaar 50 mg 1 tab by mouth daily  GERD - continue Protonix 40 mg 1 tab by mouth twice a day  COPD - no SOB; wean off O2 as tolerated;   Chest wall pain - rhabdo was ruled out with CK normal; continue tramadol 50 mg 1 tab by mouth twice a day and acetaminophen 500 mg take 2 tabs = 1000 mg by mouth every 6 hours when necessary  Allergic rhinitis - continue Allegra 180 mg 1 tab by mouth daily    Goals of care:  Short-term rehabilitation   Khrystyna Schwalm C. [place] - NP  [employer] [phone]

## 2016-03-20 LAB — BASIC METABOLIC PANEL
BUN: 29 mg/dL — AB (ref 4–21)
CREATININE: 0.8 mg/dL (ref 0.5–1.1)
Glucose: 84 mg/dL
POTASSIUM: 4.2 mmol/L (ref 3.4–5.3)
Sodium: 142 mmol/L (ref 137–147)

## 2016-03-20 LAB — HEMOGLOBIN A1C: HEMOGLOBIN A1C: 5.8

## 2016-03-27 ENCOUNTER — Other Ambulatory Visit: Payer: Self-pay

## 2016-03-27 MED ORDER — ZOLPIDEM TARTRATE 5 MG PO TABS: 5.0000 mg | ORAL_TABLET | Freq: Every evening | ORAL | 5 refills | Status: DC | PRN

## 2016-03-27 MED ORDER — OXYCODONE-ACETAMINOPHEN 5-325 MG PO TABS: 1.0000 | ORAL_TABLET | ORAL | 0 refills | Status: DC | PRN

## 2016-03-27 NOTE — Addendum Note (Signed)
Addended by: Logan Bores on: 03/27/2016 04:43 PM   Modules accepted: Orders

## 2016-03-27 NOTE — Telephone Encounter (Signed)
Rx faxed to [facility] @ [phone], phone number [phone]  

## 2016-03-28 ENCOUNTER — Non-Acute Institutional Stay (SKILLED_NURSING_FACILITY): Payer: Medicare Other | Admitting: Internal Medicine

## 2016-03-28 ENCOUNTER — Encounter: Payer: Self-pay | Admitting: Internal Medicine

## 2016-03-28 ENCOUNTER — Other Ambulatory Visit: Payer: Self-pay | Admitting: *Deleted

## 2016-03-28 DIAGNOSIS — I700 Atherosclerosis of aorta: Secondary | ICD-10-CM

## 2016-03-28 DIAGNOSIS — J449 Chronic obstructive pulmonary disease, unspecified: Secondary | ICD-10-CM

## 2016-03-28 DIAGNOSIS — I71019 Dissection of thoracic aorta, unspecified: Secondary | ICD-10-CM

## 2016-03-28 DIAGNOSIS — M1189 Other specified crystal arthropathies, multiple sites: Secondary | ICD-10-CM

## 2016-03-28 DIAGNOSIS — I10 Essential (primary) hypertension: Secondary | ICD-10-CM | POA: Diagnosis not present

## 2016-03-28 DIAGNOSIS — R0789 Other chest pain: Secondary | ICD-10-CM

## 2016-03-28 DIAGNOSIS — I7101 Dissection of thoracic aorta: Secondary | ICD-10-CM

## 2016-03-28 DIAGNOSIS — M4722 Other spondylosis with radiculopathy, cervical region: Secondary | ICD-10-CM | POA: Diagnosis not present

## 2016-03-28 DIAGNOSIS — E1149 Type 2 diabetes mellitus with other diabetic neurological complication: Secondary | ICD-10-CM | POA: Diagnosis not present

## 2016-03-28 MED ORDER — MORPHINE SULFATE 2 MG/ML IJ SOLN: INTRAMUSCULAR | 0 refills | Status: DC

## 2016-03-28 NOTE — Telephone Encounter (Signed)
[facility] #[phone] Fax: [phone] 

## 2016-03-28 NOTE — Progress Notes (Signed)
Provider:  Rexene Edison. Mariea Clonts, D.O., C.M.D. Location:  [place] Room Number: 1003-B Place of Service:  SNF ([phone])  PCP: [name], MD Patient Care Team: [name], MD as PCP - General (Internal Medicine) [name], MD (Internal Medicine) Deneise Lever, MD (Pulmonary Disease)  Extended Emergency Contact Information Primary Emergency Contact: Janeann Forehand Address: [place], [place] Phone: [phone] Mobile Phone: [phone] Relation: Daughter  Code Status: Full Code Goals of Care: Advanced Directive information Advanced Directives 03/16/2016  Does Patient Have a Medical Advance Directive? Yes  Type of Advance Directive [place]  Does patient want to make changes to medical advance directive? No - Patient declined  Copy of [place] in Chart? No - copy requested  Pre-existing out of facility DNR order (yellow form or pink MOST form) -   Chief Complaint  Patient presents with  . New Admit To SNF    New Admission Visit     HPI: Patient is a 82 y.o. female seen today for admission to [facility] 03/18/16 after hospitalization from 1/28-1/30/18 with acute on chronic neck pain, chest pain.  She has a h/o OSA on CPAP, DMII with neuropathy, GERD, COPD on oxygen therapy currently (uses typically just at night with her cpap).  She was noted to have a possible chronic dissection of the thoracic aorta with calcification of the proximal part and 3.5cm dilatation.  She was treated with tylenol for costochondritis.  She also c/o right knee arthritis.    When seen, she c/o not being on a proper diabetic diet here.  She normally checks her cbgs 3x per week.  She reports having lost 40 lbs when she had a duodenal ulcer and this dramatically improved her diabetic control--she doesn't want that to be reversed from the food here.  She had a plate of  fried everything in front of her.  F/u bmp and hba1c have already been ordered.  She is receiving PT, OT.    Past Medical History:  Diagnosis Date  . Allergic rhinitis   . Arthritis   . Complication of anesthesia    Hard time waking up  . COPD (chronic obstructive pulmonary disease) ([place])    PFT 08/08/09-FEV1 1.98/107; R 0.77; small airway obst w/tresp to dilator;DLCO 44%  . Diabetes mellitus   . Fibromuscular dysplasia (HCC)    RAS  . Hyperlipidemia   . Hypertension   . PONV (postoperative nausea and vomiting)   . Pseudogout   . Sleep apnea    AHI 89/hr   Past Surgical History:  Procedure Laterality Date  . BACK SURGERY  2005  . bilateral renal bypass    . ESOPHAGOGASTRODUODENOSCOPY N/A 10/01/2012   Procedure: ESOPHAGOGASTRODUODENOSCOPY (EGD);  Surgeon: Missy Sabins, MD;  Location: [facility] END[facility]COPY;  Service: Endoscopy;  Laterality: N/A;  . nasal septal deviation    . pituitary tumor removed     x 2  . TONSILLECTOMY      Social History   Social History  . Marital status: Widowed    Spouse name: N/A  . Number of children: N/A  . Years of education: N/A   Occupational History  . retired Animator    Social History Main Topics  . Smoking status: Former Smoker    Packs/day: 1.00    Years: 35.00    Types:  Cigarettes    Quit date: 02/17/1997  . Smokeless tobacco: Never Used  . Alcohol use No  . Drug use: No  . Sexual activity: Not Asked   Other Topics Concern  . None   Social History Narrative  . None    reports that she quit smoking about 19 years ago. Her smoking use included Cigarettes. She has a 35.00 pack-year smoking history. She has never used smokeless tobacco. She reports that she does not drink alcohol or use drugs.  Functional Status Survey:    Family History  Problem Relation Age of Onset  . Stroke Father   . Breast cancer Mother     Health Maintenance  Topic Date Due  . FOOT EXAM  [birth year]04/1944  . OPHTHALMOLOGY EXAM   [birth year]04/1946  . TETANUS/TDAP  05/04/1955  . ZOSTAVAX  05/03/1996  . DEXA SCAN  05/03/2001  . HEMOGLOBIN A1C  07/07/2015  . INFLUENZA VACCINE  11/11/2016 (Originally 09/18/2015)  . PNA vac Low Risk Adult (1 of 2 - PCV13) 11/11/2016 (Originally 05/03/2001)    Allergies  Allergen Reactions  . Valacyclovir Hcl Rash    Allergies as of 03/28/2016      Reactions   Valacyclovir Hcl Rash      Medication List       Accurate as of 03/28/16  4:10 PM. Always use your most recent med list.          acetaminophen 500 MG tablet Commonly known as:  TYLENOL Take 2 tablets (1,000 mg total) by mouth every 6 (six) hours as needed for mild pain (or Fever >/= 101).   albuterol (2.5 MG/3ML) 0.083% nebulizer solution Commonly known as:  PROVENTIL Take 3 mLs (2.5 mg total) by nebulization every 6 (six) hours as needed for wheezing or shortness of breath.   amLODipine 10 MG tablet Commonly known as:  NORVASC Take 10 mg by mouth daily.   atorvastatin 80 MG tablet Commonly known as:  LIPITOR Take 80 mg by mouth daily.   bismuth subsalicylate 99991111 MG chewable tablet Commonly known as:  PEPTO BISMOL Chew 524 mg by mouth as needed.   cholecalciferol 1000 units tablet Commonly known as:  VITAMIN D Take 1,000 Units by mouth daily.   fexofenadine 180 MG tablet Commonly known as:  ALLEGRA Take 180 mg by mouth daily.   gabapentin 300 MG capsule Commonly known as:  NEURONTIN Take 1 capsule (300 mg total) by mouth 2 (two) times daily.   IMODIUM A-D PO Take 2 mg by mouth as needed (loose stools).   losartan 50 MG tablet Commonly known as:  COZAAR Take 50 mg by mouth daily.   oxyCODONE-acetaminophen 5-325 MG tablet Commonly known as:  ROXICET Take 1 tablet by mouth every 4 (four) hours as needed for severe pain. DO NOT EXCEED 4GM OF APAP IN 24 HOURS FROM ALL SOURCES   pantoprazole 40 MG tablet Commonly known as:  PROTONIX Take 40 mg by mouth 2 (two) times daily.   potassium chloride 10 MEQ CR  capsule Commonly known as:  MICRO-K Take 10 mEq by mouth daily.   traMADol 50 MG tablet Commonly known as:  ULTRAM Take 50 mg by mouth 2 (two) times daily.   UNABLE TO FIND Med Name: Med pass 120 mL by mouth daily       Review of Systems  Constitutional: Negative for chills, fever and malaise/fatigue.  HENT: Negative for congestion.   Eyes: Negative for blurred vision.  Respiratory: Negative for cough and shortness of  breath.   Cardiovascular: Positive for chest pain. Negative for palpitations.  Gastrointestinal: Negative for abdominal pain, blood in stool, constipation and diarrhea.  Genitourinary: Negative for dysuria.  Musculoskeletal: Positive for myalgias and neck pain. Negative for falls.  Skin: Negative for rash.  Neurological: Positive for tingling, sensory change and weakness. Negative for dizziness.  Psychiatric/Behavioral: Negative for depression and memory loss.    Vitals:   03/28/16 1604  BP: 120/68  Pulse: 85  Resp: 18  Temp: 98.5 F (36.9 C)  TempSrc: Oral  SpO2: 100%  Weight: 145 lb 9.6 oz (66 kg)  Height: 5\' 3"  (1.6 m)   Body mass index is 25.79 kg/m. Physical Exam  Constitutional: She is oriented to person, place, and time. She appears well-developed and well-nourished. No distress.  HENT:  Head: Normocephalic and atraumatic.  Right Ear: External ear normal.  Left Ear: External ear normal.  Nose: Nose normal.  Mouth/Throat: Oropharynx is clear and moist.  Eyes: Conjunctivae and EOM are normal. Pupils are equal, round, and reactive to light.  Neck: Neck supple. No JVD present.  Decreased ROM neck  Cardiovascular: Normal rate, regular rhythm and intact distal pulses.   Murmur heard. Pulmonary/Chest: Effort normal and breath sounds normal. No respiratory distress.  Wearing O2  Abdominal: Soft. Bowel sounds are normal. She exhibits no distension. There is no tenderness.  Musculoskeletal: Normal range of motion. She exhibits tenderness.  Chest  wall and paravertebral neck muscles  Lymphadenopathy:    She has no cervical adenopathy.  Neurological: She is alert and oriented to person, place, and time.  Skin: Skin is warm and dry.  Psychiatric: She has a normal mood and affect.    Labs reviewed: Basic Metabolic Panel:  Recent Labs  03/16/16 1507 03/17/16 0225 03/20/16  NA 137 135 142  K 2.9* 3.9 4.2  CL 100* 101  --   CO2 25 24  --   GLUCOSE 81 132*  --   BUN 16 24* 29*  CREATININE 0.78 0.91 0.8  CALCIUM 9.3 8.5*  --   MG  --  2.3  --    Liver Function Tests:  Recent Labs  03/16/16 1507  AST 20  ALT 12*  ALKPHOS 99  BILITOT 2.0*  PROT 7.7  ALBUMIN 4.2    Recent Labs  03/16/16 2042  LIPASE 24   No results for input(s): AMMONIA in the last 8760 hours. CBC:  Recent Labs  03/16/16 1507 03/17/16 0225  WBC 8.8 9.3  NEUTROABS 7.1  --   HGB 14.3 12.9  HCT 42.5 39.2  MCV 85.0 87.3  PLT 314 319   Cardiac Enzymes:  Recent Labs  03/16/16 2042 03/17/16 0225 03/17/16 0744 03/17/16 1343  CKTOTAL  --   --   --  28  TROPONINI <0.03 <0.03 <0.03  --    BNP: Invalid input(s): POCBNP Lab Results  Component Value Date   HGBA1C 5.8 03/20/2016   Lab Results  Component Value Date   TSH 0.15 (L) 08/14/2011   Lab Results  Component Value Date   [id number] 01/12/2008   No results found for: FOLATE Lab Results  Component Value Date   IRON 15 (L) 01/12/2008   TIBC 338 01/12/2008    Imaging and Procedures obtained prior to SNF admission: Ct Chest Wo Contrast  Result Date: 03/16/2016 CLINICAL DATA:  Chronic upper back pain.  No known injury. EXAM: CT CHEST WITHOUT CONTRAST TECHNIQUE: Multidetector CT imaging of the chest was performed following the standard protocol  without IV contrast. COMPARISON:  Chest x-ray 09/30/2012.  Chest CT 05/15/2011 FINDINGS: Cardiovascular: Heart is normal size. Diffusely calcified aorta. Proximal descending thoracic aorta 3.5 cm. There are displaced calcifications  within the proximal descending thoracic aorta, likely reflecting chronic dissection. This is stable since 2013. Moderate coronary artery calcifications. Mediastinum/Nodes: Scattered small and borderline sized mediastinal lymph nodes, unchanged. No mediastinal, hilar, or axillary adenopathy. Lungs/Pleura: Mild centrilobular emphysema. No confluent airspace opacities. No pleural effusions. Upper Abdomen: Imaging into the upper abdomen shows no acute findings. Musculoskeletal: No acute bony abnormality or focal bone lesion. Prior vertebroplasty in the lower thoracic spine. IMPRESSION: Diffusely calcified aorta of with chronically displaced calcifications in the proximal descending thoracic aorta suggesting chronic dissection, unchanged since 2013. Slight dilatation of the proximal descending thoracic aorta, 3.5 cm. Coronary artery disease. Mild emphysema. Electronically Signed   By: Rolm Baptise M.D.   On: 03/16/2016 15:36   Ct Cervical Spine Wo Contrast  Result Date: 03/16/2016 CLINICAL DATA:  Chronic cervical spine pain. EXAM: CT CERVICAL SPINE WITHOUT CONTRAST TECHNIQUE: Multidetector CT imaging of the cervical spine was performed without intravenous contrast. Multiplanar CT image reconstructions were also generated. COMPARISON:  None. FINDINGS: Alignment: Normal. Skull base and vertebrae: No acute fracture. No primary bone lesion or focal pathologic process. Soft tissues and spinal canal: No prevertebral fluid or swelling. No visible canal hematoma. Disc levels: C2-3:  Mild diffuse disc bulge with mild central spinal narrowing. C3-4: Mild to moderate degenerative disc disease, spondylosis, facet arthropathy and diffuse disc bulge contribute to mild central spinal narrowing and mild to moderate left bony foraminal narrowing. C4-5: Mild diffuse disc bulge and mild facet arthropathy contribute to mild central spinal and mild left foraminal narrowing. C5-6: Moderate degenerative disc disease, spondylosis and facet  arthropathy contribute to moderate central spinal and moderate bilateral bony foraminal narrowing. C6-7: Moderate degenerative disc disease, spondylosis and facet arthropathy contribute to mild to moderate central spinal and mild to moderate bilateral bony foraminal narrowing. Upper chest: Negative. Other: None. IMPRESSION: No evidence of acute abnormality. Multilevel degenerative changes contributing to central spinal and foraminal narrowing as described. Electronically Signed   By: Margarette Canada M.D.   On: 03/16/2016 15:42    Assessment/Plan 1. Chronic thoracic aortic dissection (HCC) -noted on hospital imaging along with #2 -unclear if this is causing any of her chest tenderness/pains--these seem more like costochondritis or referred from her neck dysfunction -no plan is indicated for this in the records  2. Aortic calcification (HCC) -noted on CT chest also  3. Type 2 diabetes mellitus with neurological complications (HCC) -has been well controlled at home with hba1c 5.8 since she had a 40 lbs weight loss -will ensure she is on a diabetic diet  -bmp also wnl  4. Essential hypertension -bp well controlled, cont same regimen of cozaar and norvasc  5. Chronic obstructive pulmonary disease, unspecified COPD type ([place]) -using O2, normally just at hs with CPAP, but now wearing sitting in bed, on proventil nebs q6h prn sob, wheezing, not on any other regular COPD meds at this moment  6. Polyarticular pseudogout -including knee, cont tylenol and tramadol for mild and moderate pain, percocet for severe (try to avoid percocet due to respiratory depression risk with her lung disease) and the increased fall risk   7. Chest wall pain -felt to be costochondritis at hospital and treated with tylenol, might consider short term nsaid  (like 72 hr) if she continues to c/o this discomfort  8. Degenerative arthritis of cervical  spine with nerve compression -is to f/u with neurosurgery on this -cont  tylenol, tramadol and percocet when severe, not to exceed 3g tylenol per day  Family/ staff Communication: discussed with nursing  Labs/tests ordered:  No additional   Ladeja Pelham L. Kc Sedlak, D.O. [facility] [address], [place] [address] Cell Phone (Mon-Fri 8am-5pm):  [phone] On Call:  [phone] & follow prompts after 5pm & weekends Office Phone:  [phone] Office Fax:  [phone]

## 2016-04-03 ENCOUNTER — Non-Acute Institutional Stay (SKILLED_NURSING_FACILITY): Payer: Medicare Other | Admitting: Adult Health

## 2016-04-03 ENCOUNTER — Encounter: Payer: Self-pay | Admitting: Adult Health

## 2016-04-03 DIAGNOSIS — R0789 Other chest pain: Secondary | ICD-10-CM | POA: Diagnosis not present

## 2016-04-03 DIAGNOSIS — J449 Chronic obstructive pulmonary disease, unspecified: Secondary | ICD-10-CM

## 2016-04-03 DIAGNOSIS — G4733 Obstructive sleep apnea (adult) (pediatric): Secondary | ICD-10-CM | POA: Diagnosis not present

## 2016-04-03 DIAGNOSIS — K219 Gastro-esophageal reflux disease without esophagitis: Secondary | ICD-10-CM | POA: Diagnosis not present

## 2016-04-03 DIAGNOSIS — R531 Weakness: Secondary | ICD-10-CM | POA: Diagnosis not present

## 2016-04-03 DIAGNOSIS — M542 Cervicalgia: Secondary | ICD-10-CM | POA: Diagnosis not present

## 2016-04-03 DIAGNOSIS — E119 Type 2 diabetes mellitus without complications: Secondary | ICD-10-CM

## 2016-04-03 DIAGNOSIS — J309 Allergic rhinitis, unspecified: Secondary | ICD-10-CM | POA: Diagnosis not present

## 2016-04-03 DIAGNOSIS — I10 Essential (primary) hypertension: Secondary | ICD-10-CM

## 2016-04-03 DIAGNOSIS — G8929 Other chronic pain: Secondary | ICD-10-CM

## 2016-04-03 DIAGNOSIS — E876 Hypokalemia: Secondary | ICD-10-CM

## 2016-04-03 NOTE — Progress Notes (Signed)
DATE:  04/03/2016   MRN:  [id number]  BIRTHDAY: [birth year]  Facility:  [facility]urs[facility] Home Location:  [facility] Room Number: 1003-B  LEVEL OF CARE:  SNF (31)  Contact Information    Name Relation Home Work [name] Daughter [phone]  [phone]       Code Status History    Date Active Date Inactive Code Status Order ID Comments User Context   03/16/2016  8:16 PM 03/18/2016  6:27 PM Full Code [id number]  [name], MD ED   01/06/2015  4:54 AM 01/09/2015  4:13 PM Full Code [id number]  [name], MD Inpatient   10/01/2012  6:22 AM 10/05/2012 10:19 PM Full Code [id number]  [name], MD Inpatient       Chief Complaint  Patient presents with  . Discharge Note    HISTORY OF PRESENT ILLNESS:  This is a 38-YO female seen for a discharge visit.  She will discharge 04/05/2016 with home health OT, PT, CNA, and Nursing services.    She was admitted to [place] [facility] on 03/18/2016 following an admission at [facility] 03/16/2016-03/18/2016 with complaints of neck pain, back pain, nausea, vomiting, and chest wall pain.  She had been unable to get out of bed due to extreme pain.  CT of the neck and chest performed in the ED revealed chronic changes with mild foraminal narrowing. Gabapentin dose was cut in half due to sedation. She had hypokalemia and now resolved.   Patient was admitted to this facility for short-term rehabilitation after the patient's recent hospitalization.  Patient has completed SNF rehabilitation and therapy has cleared the patient for discharge.   PAST MEDICAL HISTORY:  Past Medical History:  Diagnosis Date  . Allergic rhinitis   . Arthritis   . Complication of anesthesia    Hard time waking up  . COPD (chronic obstructive pulmonary disease) ([place])    PFT 08/08/09-FEV1 1.98/107; R 0.77; small airway obst w/tresp to dilator;DLCO 44%  . Diabetes mellitus   . Fibromuscular dysplasia (HCC)    RAS    . Hyperlipidemia   . Hypertension   . PONV (postoperative nausea and vomiting)   . Pseudogout   . Sleep apnea    AHI 89/hr     CURRENT MEDICATIONS: Reviewed  Patient's Medications  New Prescriptions   No medications on file  Previous Medications   ACETAMINOPHEN (TYLENOL) 500 MG TABLET    Take 2 tablets (1,000 mg total) by mouth every 6 (six) hours as needed for mild pain (or Fever >/= 101).   ALBUTEROL (PROVENTIL) (2.5 MG/3ML) 0.083% NEBULIZER SOLUTION    Take 3 mLs (2.5 mg total) by nebulization every 6 (six) hours as needed for wheezing or shortness of breath.   AMLODIPINE (NORVASC) 10 MG TABLET    Take 10 mg by mouth daily.    ATORVASTATIN (LIPITOR) 80 MG TABLET    Take 80 mg by mouth daily.     BISMUTH SUBSALICYLATE (PEPTO BISMOL) 262 MG CHEWABLE TABLET    Chew 524 mg by mouth as needed.   CHOLECALCIFEROL (VITAMIN D) 1000 UNITS TABLET    Take 1,000 Units by mouth daily.   FEXOFENADINE (ALLEGRA) 180 MG TABLET    Take 180 mg by mouth daily.   GABAPENTIN (NEURONTIN) 300 MG CAPSULE    Take 1 capsule (300 mg total) by mouth 2 (two) times daily.   LOPERAMIDE HCL (IMODIUM A-D PO)    Take 2  mg by mouth as needed (loose stools).    LOSARTAN (COZAAR) 50 MG TABLET    Take 50 mg by mouth daily.   OXYCODONE-ACETAMINOPHEN (ROXICET) 5-325 MG TABLET    Take 1 tablet by mouth every 4 (four) hours as needed for severe pain. DO NOT EXCEED 4GM OF APAP IN 24 HOURS FROM ALL SOURCES   PANTOPRAZOLE (PROTONIX) 40 MG TABLET    Take 40 mg by mouth 2 (two) times daily.   POTASSIUM CHLORIDE (MICRO-K) 10 MEQ CR CAPSULE    Take 10 mEq by mouth daily.   TRAMADOL (ULTRAM) 50 MG TABLET    Take 50 mg by mouth 2 (two) times daily.   UNABLE TO FIND    Med Name: Med pass 120 mL by mouth daily  Modified Medications   No medications on file  Discontinued Medications   No medications on file     Allergies  Allergen Reactions  . Valacyclovir Hcl Rash     REVIEW OF SYSTEMS:  GENERAL: no change in appetite, no  fatigue, no weight changes, no fever, chills or weakness EYES: Denies change in vision, dry eyes, eye pain, itching or discharge EARS: Denies change in hearing, ringing in ears, or earache NOSE: Denies nasal congestion or epistaxis MOUTH and THROAT: Denies oral discomfort, gingival pain or bleeding, pain from teeth or hoarseness   RESPIRATORY: no cough, SOB, DOE, wheezing, hemoptysis CARDIAC: no chest pain, edema or palpitations GI: no abdominal pain, diarrhea, constipation, heart burn, nausea or vomiting GU: Denies dysuria, frequency, hematuria, incontinence, or discharge PSYCHIATRIC: Denies feeling of depression or anxiety. No report of hallucinations, insomnia, paranoia, or agitation    PHYSICAL EXAMINATION  GENERAL APPEARANCE: Well nourished. In no acute distress. Normal body habitus SKIN:  Skin is warm and dry.  HEAD: Normal in size and contour. No evidence of trauma EYES: Lids open and close normally. No blepharitis, entropion or ectropion. PERRL. Conjunctivae are clear and sclerae are white. Lenses are without opacity EARS: Pinnae are normal. Patient hears normal voice tunes of the examiner MOUTH and THROAT: Lips are without lesions. Oral mucosa is moist and without lesions. Tongue is normal in shape, size, and color and without lesions NECK: supple, trachea midline, no neck masses, no thyroid tenderness, no thyromegaly LYMPHATICS: no LAN in the neck, no supraclavicular LAN RESPIRATORY: breathing is even & unlabored, BS CTAB CARDIAC: RRR, no murmur,no extra heart sounds, no edema GI: abdomen soft, normal BS, no masses, no tenderness, no hepatomegaly, no splenomegaly PSYCHIATRIC: Alert and oriented X 3. Affect and behavior are appropriate   LABS/RADIOLOGY: Labs reviewed: Basic Metabolic Panel:  Recent Labs  03/16/16 1507 03/17/16 0225 03/20/16  NA 137 135 142  K 2.9* 3.9 4.2  CL 100* 101  --   CO2 25 24  --   GLUCOSE 81 132*  --   BUN 16 24* 29*  CREATININE 0.78 0.91  0.8  CALCIUM 9.3 8.5*  --   MG  --  2.3  --    Liver Function Tests:  Recent Labs  03/16/16 1507  AST 20  ALT 12*  ALKPHOS 99  BILITOT 2.0*  PROT 7.7  ALBUMIN 4.2    Recent Labs  03/16/16 2042  LIPASE 24   CBC:  Recent Labs  03/16/16 1507 03/17/16 0225  WBC 8.8 9.3  NEUTROABS 7.1  --   HGB 14.3 12.9  HCT 42.5 39.2  MCV 85.0 87.3  PLT 314 319   Cardiac Enzymes:  Recent Labs  03/16/16 2042  03/17/16 0225 03/17/16 0744 03/17/16 1343  CKTOTAL  --   --   --  58  TROPONINI <0.03 <0.03 <0.03  --    CBG:  Recent Labs  03/18/16 0720 03/18/16 0756 03/18/16 1204  GLUCAP 100* 114* 154*      Ct Chest Wo Contrast  Result Date: 03/16/2016 CLINICAL DATA:  Chronic upper back pain.  No known injury. EXAM: CT CHEST WITHOUT CONTRAST TECHNIQUE: Multidetector CT imaging of the chest was performed following the standard protocol without IV contrast. COMPARISON:  Chest x-ray 09/30/2012.  Chest CT 05/15/2011 FINDINGS: Cardiovascular: Heart is normal size. Diffusely calcified aorta. Proximal descending thoracic aorta 3.5 cm. There are displaced calcifications within the proximal descending thoracic aorta, likely reflecting chronic dissection. This is stable since 2013. Moderate coronary artery calcifications. Mediastinum/Nodes: Scattered small and borderline sized mediastinal lymph nodes, unchanged. No mediastinal, hilar, or axillary adenopathy. Lungs/Pleura: Mild centrilobular emphysema. No confluent airspace opacities. No pleural effusions. Upper Abdomen: Imaging into the upper abdomen shows no acute findings. Musculoskeletal: No acute bony abnormality or focal bone lesion. Prior vertebroplasty in the lower thoracic spine. IMPRESSION: Diffusely calcified aorta of with chronically displaced calcifications in the proximal descending thoracic aorta suggesting chronic dissection, unchanged since 2013. Slight dilatation of the proximal descending thoracic aorta, 3.5 cm. Coronary artery  disease. Mild emphysema. Electronically Signed   By: Rolm Baptise M.D.   On: 03/16/2016 15:36   Ct Cervical Spine Wo Contrast  Result Date: 03/16/2016 CLINICAL DATA:  Chronic cervical spine pain. EXAM: CT CERVICAL SPINE WITHOUT CONTRAST TECHNIQUE: Multidetector CT imaging of the cervical spine was performed without intravenous contrast. Multiplanar CT image reconstructions were also generated. COMPARISON:  None. FINDINGS: Alignment: Normal. Skull base and vertebrae: No acute fracture. No primary bone lesion or focal pathologic process. Soft tissues and spinal canal: No prevertebral fluid or swelling. No visible canal hematoma. Disc levels: C2-3:  Mild diffuse disc bulge with mild central spinal narrowing. C3-4: Mild to moderate degenerative disc disease, spondylosis, facet arthropathy and diffuse disc bulge contribute to mild central spinal narrowing and mild to moderate left bony foraminal narrowing. C4-5: Mild diffuse disc bulge and mild facet arthropathy contribute to mild central spinal and mild left foraminal narrowing. C5-6: Moderate degenerative disc disease, spondylosis and facet arthropathy contribute to moderate central spinal and moderate bilateral bony foraminal narrowing. C6-7: Moderate degenerative disc disease, spondylosis and facet arthropathy contribute to mild to moderate central spinal and mild to moderate bilateral bony foraminal narrowing. Upper chest: Negative. Other: None. IMPRESSION: No evidence of acute abnormality. Multilevel degenerative changes contributing to central spinal and foraminal narrowing as described. Electronically Signed   By: Margarette Canada M.D.   On: 03/16/2016 15:42    ASSESSMENT/PLAN:  Generalized weakness - for Home health PT and OT, for therapeutic strengthening exercises; fall precautions  Chronic neck pain - CT C-spine showed no evidence of acute abnormality but has multilevel degenerative changes contributing to central spine and foraminal narrowing; continue  gabapentin 300 mg by mouth twice a day; follow-up with neurosurgeon  Hypokalemia - discontinue KCl ER ; resolved Lab Results  Component Value Date   K 4.2 03/20/2016    OSA - CPAP  at bedtime  Diabetes mellitus, type II - diet controlled Lab Results  Component Value Date   HGBA1C 5.8 03/20/2016    Hypertension - well-controlled; continue Norvasc 10 mg 1 tab by mouth daily and Cozaar 50 mg 1 tab by mouth daily  GERD - stable; continue Protonix 40 mg 1 tab  by mouth twice a day  COPD - no SOB; start ProAir HFA 90 mcg/ACT inhale  2 puffs into lungs Q 4 hours PRN  Chest wall pain - well-controlled; will change tramadol 50 mg 1 tab by mouth twice a day  To PRN and continue acetaminophen 500 mg take 2 tabs = 1000 mg by mouth every 6 hours when necessary and Percocet 5/325 mg 1 tab PO Q 4 hours PRN  Allergic rhinitis - continue Allegra 180 mg 1 tab by mouth daily     I have filled out patient's discharge paperwork and written prescriptions.  Patient will receive home health PT, OT, Nursing and CNA.  DME provided:  None  Total discharge time: Greater than 30 minutes Greater than 50% was spent in counseling and coordination of care with the patient.   Discharge time involved coordination of the discharge process with social worker, nursing staff and therapy department. Medical justification for home health services verified.   Monina C. Piedmont - NP  [employer] [phone]

## 2016-04-18 ENCOUNTER — Other Ambulatory Visit: Payer: Self-pay | Admitting: Adult Health

## 2016-04-20 DIAGNOSIS — M4722 Other spondylosis with radiculopathy, cervical region: Secondary | ICD-10-CM | POA: Insufficient documentation

## 2016-04-30 ENCOUNTER — Ambulatory Visit: Payer: Medicare Other | Admitting: Cardiology

## 2016-05-01 ENCOUNTER — Encounter: Payer: Self-pay | Admitting: Cardiology

## 2016-05-01 ENCOUNTER — Telehealth: Payer: Self-pay | Admitting: Cardiology

## 2016-05-01 NOTE — Telephone Encounter (Signed)
NOTES FAXED TO [facility] °

## 2016-05-02 ENCOUNTER — Other Ambulatory Visit: Payer: Self-pay | Admitting: Adult Health

## 2016-05-12 NOTE — Progress Notes (Signed)
Cardiology Office Note   Date:  05/13/2016   ID:  [name], DOB 14-Mar-1934, MRN [id number]  PCP:  [name], MD  Cardiologist:   Minus Breeding, MD  Referring:  [name], MD  Chief Complaint  Patient presents with  . Abnormal CT      History of Present Illness: [name] is a 82 y.o. female who presents for evaluation of aortic atherosclerosis.  She has a history of renal artery bypass.   I saw her last in 2013.  She had AS on echo that was severe by echo but moderate at most by right and left heart cath.  She was recently in the hospital with neck pain that was felt to be spinal.  I reviewed these records.  She was found to have a CT with A thoracic 3.5 cm descending aorta with calcification probably reflecting chronic dissection. She also had coronary calcifications.  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  I don't see a 2013 CT that demonstrates the chronic thoracic dissection.  The patient has had no acute cardiac complaints. She was sent for follow-up of the aorta. There is a mention of her being fairly poor keeping her visits. This is from review of her primary care records. She denies any acute complaints. She gets around somewhat slowly with neck problems. She does not describe any new shortness of breath, PND or orthopnea. She has chronic dyspnea which has been unchanged since I last saw her. She's not having any palpitations, presyncope or syncope. She describes herself as being low energy.  Past Medical History:  Diagnosis Date  . Allergic rhinitis   . Arthritis   . Complication of anesthesia    Hard time waking up  . COPD (chronic obstructive pulmonary disease) ([place])    PFT 08/08/09-FEV1 1.98/107; R 0.77; small airway obst w/tresp to dilator;DLCO 44%  . Diabetes mellitus   .  Fibromuscular dysplasia (HCC)    RAS  . Hyperlipidemia   . Hypertension   . PONV (postoperative nausea and vomiting)   . Pseudogout   . Sleep apnea    AHI 89/hr    Past Surgical History:  Procedure Laterality Date  . BACK SURGERY  2005  . bilateral renal bypass    . ESOPHAGOGASTRODUODENOSCOPY N/A 10/01/2012   Procedure: ESOPHAGOGASTRODUODENOSCOPY (EGD);  Surgeon: Missy Sabins, MD;  Location: [facility] ENDOSCOPY;  Service: Endoscopy;  Laterality: N/A;  . nasal septal deviation    . pituitary tumor removed     x 2  . TONSILLECTOMY       Current Outpatient Prescriptions  Medication Sig Dispense Refill  . acetaminophen (TYLENOL) 500 MG tablet Take 2 tablets (1,000 mg total) by mouth every 6 (six) hours as needed for mild pain (or Fever >/= 101).    Marland Kitchen amLODipine (NORVASC) 10 MG tablet Take 10 mg by mouth daily.     Marland Kitchen atorvastatin (LIPITOR) 80 MG tablet Take 80 mg by mouth daily.      Marland Kitchen bismuth subsalicylate (PEPTO BISMOL) 262 MG chewable tablet Chew 524 mg by mouth as needed.    . cholecalciferol (VITAMIN D) 1000 units tablet Take 1,000 Units by mouth daily.    . fexofenadine (ALLEGRA) 180 MG tablet Take 180 mg by mouth daily.    Marland Kitchen gabapentin (NEURONTIN) 300 MG capsule  Take 1 capsule (300 mg total) by mouth 2 (two) times daily. 60 capsule 0  . gabapentin (NEURONTIN) 600 MG tablet Take 600 mg by mouth at bedtime.    Marland Kitchen losartan (COZAAR) 50 MG tablet Take 50 mg by mouth daily.  11  . pantoprazole (PROTONIX) 40 MG tablet Take 40 mg by mouth 2 (two) times daily.  4  . potassium chloride (MICRO-K) 10 MEQ CR capsule Take 10 mEq by mouth daily.    . traMADol (ULTRAM) 50 MG tablet Take 50 mg by mouth 2 (two) times daily.    Marland Kitchen UNABLE TO FIND Med Name: Med pass 120 mL by mouth daily     No current facility-administered medications for this visit.     Allergies:   Valacyclovir hcl    Social History:  The patient  reports that she quit smoking about 19 years ago. Her smoking use included  Cigarettes. She has a 35.00 pack-year smoking history. She has never used smokeless tobacco. She reports that she does not drink alcohol or use drugs.   Family History:  The patient's family history includes Breast cancer in her mother; Stroke in her father.    ROS:  Please see the history of present illness.   Otherwise, review of systems are positive for none.   All other systems are reviewed and negative.    PHYSICAL EXAM: VS:  BP 94/64 Comment: Right arm.  Pulse 84   Ht 5\' 3"  (1.6 m)   Wt 143 lb (64.9 kg)   BMI 25.33 kg/m  , BMI Body mass index is 25.33 kg/m. GENERAL:  Well appearing,  but slightly frail for her age 41:  Pupils equal round and reactive, fundi not visualized, oral mucosa unremarkable NECK:  No jugular venous distention, waveform within normal limits, carotid upstroke brisk and symmetric, no bruits, no thyromegaly LYMPHATICS:  No cervical, inguinal adenopathy LUNGS:  Clear to auscultation bilaterally BACK:  No CVA tenderness CHEST:  Unremarkable HEART:  PMI not displaced or sustained,S1 and S2 within normal limits, no S3, no S4, no clicks, no rubs, 2 out of 6 mid peaking apical systolic murmur radiating out the aortic outflow tract murmurs ABD:  Flat, positive bowel sounds normal in frequency in pitch, no bruits, no rebound, no guarding, no midline pulsatile mass, no hepatomegaly, no splenomegaly EXT:  Unable to palpate radials bilaterally and also diminished dorsalis pedis and posterior tibialis bilaterally, mild edema, no cyanosis no clubbing SKIN:  No rashes no nodules NEURO:  Cranial nerves II through XII grossly intact, motor grossly intact throughout PSYCH:  Cognitively intact, oriented to person place and time    EKG:  EKG is ordered today. The ekg ordered today demonstrates sinus rhythm, rate 84, axis within normal limits, intervals within normal limits, poor anterior R wave progression.   Recent Labs: 03/16/2016: ALT 12 03/17/2016: Hemoglobin 12.9;  Magnesium 2.3; Platelets 319 03/20/2016: BUN 29; Creatinine 0.8; Potassium 4.2; Sodium 142    Lipid Panel No results found for: CHOL, TRIG, HDL, CHOLHDL, VLDL, LDLCALC, LDLDIRECT    Wt Readings from Last 3 Encounters:  05/13/16 143 lb (64.9 kg)  04/03/16 143 lb (64.9 kg)  03/28/16 145 lb 9.6 oz (66 kg)      Other studies Reviewed: Additional studies/ records that were reviewed today include: CT scan, hospital records. Review of the above records demonstrates:  Please see elsewhere in the note.     ASSESSMENT AND PLAN:   AORTIC ATHEROSCLEROSIS:  CT during her hospitalization suggested possible  chronic dissection reportedly unchanged from 2013. I don't see this report describing the chronic dissection. She does have significant vascular disease. She has diminished upper pulses and lower pulses. I like to send her to [facility] and have been reviewed to consider the indication for possible CTA. For now she needs good risk reduction.  AORTIC STENOSIS: I will follow-up with an echocardiogram although this was spurious slowly overestimating her gradient in the past. I'll keep this in line with the interpretation.  CHEST PAIN:  This was thought to be musculoskeletal during her January admission.  No further evaluation is planned.  SLEEP APNEA: I have suggested follow-up with Dr. Annamaria Boots.  HTN:  Her blood pressures recorded very low and she says they're always hard to take. I don't strongly suspect bilateral subclavian stenosis but will keep this in mind for further imaging. For now she'll continue the meds as listed.  DM    Current medicines are reviewed at length with the patient today.  The patient does not have concerns regarding medicines.  The following changes have been made:  no change  Labs/ tests ordered today include:   Orders Placed This Encounter  Procedures  . Ambulatory referral to Vascular Surgery  . EKG 12-Lead  . ECHOCARDIOGRAM COMPLETE     Disposition:   FU with me  after the echo and vascular appt.    Signed, Minus Breeding, MD  05/13/2016 5:40 PM    [facility]

## 2016-05-13 ENCOUNTER — Encounter: Payer: Self-pay | Admitting: Cardiology

## 2016-05-13 ENCOUNTER — Ambulatory Visit (INDEPENDENT_AMBULATORY_CARE_PROVIDER_SITE_OTHER): Payer: Medicare Other | Admitting: Cardiology

## 2016-05-13 VITALS — BP 94/64 | HR 84 | Ht 63.0 in | Wt 143.0 lb

## 2016-05-13 DIAGNOSIS — I350 Nonrheumatic aortic (valve) stenosis: Secondary | ICD-10-CM | POA: Diagnosis not present

## 2016-05-13 DIAGNOSIS — I700 Atherosclerosis of aorta: Secondary | ICD-10-CM

## 2016-05-13 DIAGNOSIS — G4730 Sleep apnea, unspecified: Secondary | ICD-10-CM | POA: Diagnosis not present

## 2016-05-13 DIAGNOSIS — I7100 Dissection of unspecified site of aorta: Secondary | ICD-10-CM | POA: Insufficient documentation

## 2016-05-13 NOTE — Patient Instructions (Signed)
Medication Instructions:  Continue current medications  Labwork: None Ordered  Testing/Procedures: Your physician has requested that you have an echocardiogram. Echocardiography is a painless test that uses sound waves to create images of your heart. It provides your doctor with information about the size and shape of your heart and how well your heart's chambers and valves are working. This procedure takes approximately one hour. There are no restrictions for this procedure.  Follow-Up: You have been referred to Dr Trula Slade   Any Other Special Instructions Will Be Listed Below (If Applicable).   If you need a refill on your cardiac medications before your next appointment, please call your pharmacy.

## 2016-05-26 ENCOUNTER — Encounter: Payer: Medicare Other | Admitting: Surgery

## 2016-06-03 ENCOUNTER — Other Ambulatory Visit: Payer: Self-pay

## 2016-06-03 ENCOUNTER — Ambulatory Visit (HOSPITAL_COMMUNITY): Payer: Medicare Other | Attending: Cardiovascular Disease

## 2016-06-03 DIAGNOSIS — G4730 Sleep apnea, unspecified: Secondary | ICD-10-CM | POA: Insufficient documentation

## 2016-06-03 DIAGNOSIS — E119 Type 2 diabetes mellitus without complications: Secondary | ICD-10-CM | POA: Insufficient documentation

## 2016-06-03 DIAGNOSIS — E785 Hyperlipidemia, unspecified: Secondary | ICD-10-CM | POA: Diagnosis not present

## 2016-06-03 DIAGNOSIS — J449 Chronic obstructive pulmonary disease, unspecified: Secondary | ICD-10-CM | POA: Diagnosis not present

## 2016-06-03 DIAGNOSIS — I350 Nonrheumatic aortic (valve) stenosis: Secondary | ICD-10-CM | POA: Diagnosis not present

## 2016-06-03 DIAGNOSIS — I10 Essential (primary) hypertension: Secondary | ICD-10-CM | POA: Diagnosis not present

## 2016-06-03 DIAGNOSIS — I082 Rheumatic disorders of both aortic and tricuspid valves: Secondary | ICD-10-CM | POA: Diagnosis not present

## 2016-06-06 ENCOUNTER — Encounter: Payer: Self-pay | Admitting: Surgery

## 2016-06-16 ENCOUNTER — Encounter: Payer: Medicare Other | Admitting: Surgery

## 2016-07-23 ENCOUNTER — Encounter: Payer: Self-pay | Admitting: Surgery

## 2016-07-28 ENCOUNTER — Ambulatory Visit (INDEPENDENT_AMBULATORY_CARE_PROVIDER_SITE_OTHER): Payer: Medicare Other | Admitting: Surgery

## 2016-07-28 ENCOUNTER — Encounter: Payer: Self-pay | Admitting: Surgery

## 2016-07-28 VITALS — BP 104/68 | HR 96 | Temp 97.3°F | Resp 20 | Ht 63.0 in | Wt 145.0 lb

## 2016-07-28 DIAGNOSIS — I7101 Dissection of thoracic aorta: Secondary | ICD-10-CM | POA: Diagnosis not present

## 2016-07-28 DIAGNOSIS — I71019 Dissection of thoracic aorta, unspecified: Secondary | ICD-10-CM

## 2016-07-28 NOTE — Progress Notes (Signed)
[facility]  Patient name: [name] MRN: [id number] DOB: 05/24/1934 Sex: female   REQUESTING PROVIDER:    Dr. Percival Spanish   REASON FOR CONSULT:    Aortic dissection  HISTORY OF PRESENT ILLNESS:   [name] is a 82 y.o. female, who is Referred today for evaluation of an aortic dissection.The patient recently had a CT scan which was without contrast showing her thoracic aorta measured 3.5 cm with calcification probably secondary to a chronic dissection.  In reviewing her CT scan from a prostate 5 years ago this appears to be essentially unchanged.  She is currently without any significant back pain.  The patient has a history of renal artery bypass in the remote past following complications from angioplasty.  She suffers from hypercholesterolemia which is managed with a statin.  Her hypertension is medically managed including an ARB.  She is a former smoker.  PAST MEDICAL HISTORY    Past Medical History:  Diagnosis Date  . Allergic rhinitis   . Arthritis   . Complication of anesthesia    Hard time waking up  . COPD (chronic obstructive pulmonary disease) ([place])    PFT 08/08/09-FEV1 1.98/107; R 0.77; small airway obst w/tresp to dilator;DLCO 44%  . Diabetes mellitus   . Fibromuscular dysplasia (HCC)    RAS  . Hyperlipidemia   . Hypertension   . PONV (postoperative nausea and vomiting)   . Pseudogout   . Sleep apnea    AHI 89/hr     FAMILY HISTORY   Family History  Problem Relation Age of Onset  . Stroke Father   . Breast cancer Mother     SOCIAL HISTORY:   Social History   Social History  . Marital status: Widowed    Spouse name: N/A  . Number of children: N/A  . Years of education: N/A   Occupational History  . retired Animator    Social History Main Topics  . Smoking status: Former Smoker    Packs/day: 1.00    Years: 35.00    Types: Cigarettes     Quit date: 02/17/1997  . Smokeless tobacco: Never Used  . Alcohol use No  . Drug use: No  . Sexual activity: Not on file   Other Topics Concern  . Not on file   Social History Narrative  . No narrative on file    ALLERGIES:    Allergies  Allergen Reactions  . Valacyclovir Hcl Rash    CURRENT MEDICATIONS:    Current Outpatient Prescriptions  Medication Sig Dispense Refill  . acetaminophen (TYLENOL) 500 MG tablet Take 2 tablets (1,000 mg total) by mouth every 6 (six) hours as needed for mild pain (or Fever >/= 101).    Marland Kitchen amLODipine (NORVASC) 10 MG tablet Take 10 mg by mouth daily.     Marland Kitchen atorvastatin (LIPITOR) 80 MG tablet Take 80 mg by mouth daily.      Marland Kitchen bismuth subsalicylate (PEPTO BISMOL) 262 MG chewable tablet Chew 524 mg by mouth as needed.    . fexofenadine (ALLEGRA) 180 MG tablet Take 180 mg by mouth daily.    Marland Kitchen gabapentin (NEURONTIN) 600 MG tablet Take 600 mg by mouth at bedtime.    Marland Kitchen losartan (COZAAR) 50 MG tablet Take 50 mg by mouth daily.  11  . pantoprazole (PROTONIX) 40 MG tablet Take 40 mg by mouth 2 (two) times daily.  4  . potassium chloride (MICRO-K) 10 MEQ CR capsule Take 10  mEq by mouth daily.    . traMADol (ULTRAM) 50 MG tablet Take 50 mg by mouth 2 (two) times daily.    Marland Kitchen UNABLE TO FIND Med Name: Med pass 120 mL by mouth daily     No current facility-administered medications for this visit.     REVIEW OF SYSTEMS:   [X]  denotes positive finding, [ ]  denotes negative finding Cardiac  Comments:  Chest pain or chest pressure:    Shortness of breath upon exertion:    Short of breath when lying flat:    Irregular heart rhythm:        Vascular    Pain in calf, thigh, or hip brought on by ambulation:    Pain in feet at night that wakes you up from your sleep:     Blood clot in your veins:    Leg swelling:         Pulmonary    Oxygen at home:    Productive cough:     Wheezing:         Neurologic    Sudden weakness in arms or legs:     Sudden  numbness in arms or legs:     Sudden onset of difficulty speaking or slurred speech:    Temporary loss of vision in one eye:     Problems with dizziness:         Gastrointestinal    Blood in stool:      Vomited blood:         Genitourinary    Burning when urinating:     Blood in urine:        Psychiatric    Castillo depression:         Hematologic    Bleeding problems:    Problems with blood clotting too easily:        Skin    Rashes or ulcers:        Constitutional    Fever or chills:     PHYSICAL EXAM:   Vitals:   07/28/16 1409  BP: 104/68  Pulse: 96  Resp: 20  Temp: 97.3 F (36.3 C)  TempSrc: Oral  SpO2: 96%  Weight: 145 lb (65.8 kg)  Height: 5\' 3"  (1.6 m)    GENERAL: The patient is a well-nourished female, in no acute distress. The vital signs are documented above. CARDIAC: There is a regular rate and rhythm.  VASCULAR: Palpable pedal pulses PULMONARY: Nonlabored respirations MUSCULOSKELETAL: There are no Castillo deformities or cyanosis. NEUROLOGIC: No focal weakness or paresthesias are detected. SKIN: There are no ulcers or rashes noted. PSYCHIATRIC: The patient has a normal affect. STUDIES:   I have reviewed the CT scan with the following results: Diffusely calcified aorta of with chronically displaced calcifications in the proximal descending thoracic aorta suggesting chronic dissection, unchanged since 2013. Slight dilatation of the proximal descending thoracic aorta, 3.5 cm.   ASSESSMENT and PLAN   Aortic atherosclerosis.  Her maximum diameter of the descending thoracic aorta is approximately 3.5 cm.  Findings on the CT scan are suggestive of a chronic dissection, although this cannot be absolute, given the lack of IV contrast.  Regardless based on the size and lack of symptoms, no intervention is recommended currently.  I would like to continue to monitor this.  I have ordered a CT scan with contrast to be performed in 2 years.   Breanna Major,  MD [facility] [phone] Pager [phone]

## 2017-02-20 ENCOUNTER — Other Ambulatory Visit: Payer: Self-pay | Admitting: Adult Health

## 2017-03-13 ENCOUNTER — Other Ambulatory Visit: Payer: Self-pay | Admitting: Sports Medicine

## 2017-03-13 DIAGNOSIS — M545 Low back pain, unspecified: Secondary | ICD-10-CM

## 2017-03-23 ENCOUNTER — Ambulatory Visit
Admission: RE | Admit: 2017-03-23 | Discharge: 2017-03-23 | Disposition: A | Discharge status: Home / Self Care | Payer: Medicare Other | Source: Ambulatory Visit | Attending: Sports Medicine | Admitting: Sports Medicine

## 2017-03-23 DIAGNOSIS — M545 Low back pain, unspecified: Secondary | ICD-10-CM

## 2018-09-16 ENCOUNTER — Other Ambulatory Visit: Payer: Self-pay | Admitting: Internal Medicine

## 2018-09-16 DIAGNOSIS — Z1231 Encounter for screening mammogram for malignant neoplasm of breast: Secondary | ICD-10-CM

## 2018-11-22 ENCOUNTER — Ambulatory Visit: Payer: Medicare Other

## 2019-01-06 ENCOUNTER — Ambulatory Visit: Payer: Medicare Other

## 2019-03-02 ENCOUNTER — Ambulatory Visit: Payer: Medicare Other

## 2019-04-12 ENCOUNTER — Ambulatory Visit: Payer: Self-pay

## 2019-04-14 ENCOUNTER — Ambulatory Visit: Payer: Self-pay

## 2019-07-06 DIAGNOSIS — G4733 Obstructive sleep apnea (adult) (pediatric): Secondary | ICD-10-CM | POA: Diagnosis not present

## 2019-08-06 DIAGNOSIS — G4733 Obstructive sleep apnea (adult) (pediatric): Secondary | ICD-10-CM | POA: Diagnosis not present

## 2019-09-05 DIAGNOSIS — G4733 Obstructive sleep apnea (adult) (pediatric): Secondary | ICD-10-CM | POA: Diagnosis not present

## 2019-09-16 DIAGNOSIS — Z0000 Encounter for general adult medical examination without abnormal findings: Secondary | ICD-10-CM | POA: Diagnosis not present

## 2019-09-16 DIAGNOSIS — E7800 Pure hypercholesterolemia, unspecified: Secondary | ICD-10-CM | POA: Diagnosis not present

## 2019-09-16 DIAGNOSIS — I10 Essential (primary) hypertension: Secondary | ICD-10-CM | POA: Diagnosis not present

## 2019-09-16 DIAGNOSIS — E1149 Type 2 diabetes mellitus with other diabetic neurological complication: Secondary | ICD-10-CM | POA: Diagnosis not present

## 2019-09-16 DIAGNOSIS — E559 Vitamin D deficiency, unspecified: Secondary | ICD-10-CM | POA: Diagnosis not present

## 2019-09-16 DIAGNOSIS — E038 Other specified hypothyroidism: Secondary | ICD-10-CM | POA: Diagnosis not present

## 2019-09-22 DIAGNOSIS — E7800 Pure hypercholesterolemia, unspecified: Secondary | ICD-10-CM | POA: Diagnosis not present

## 2019-09-22 DIAGNOSIS — I519 Heart disease, unspecified: Secondary | ICD-10-CM | POA: Diagnosis not present

## 2019-09-22 DIAGNOSIS — N1831 Chronic kidney disease, stage 3a: Secondary | ICD-10-CM | POA: Diagnosis not present

## 2019-09-22 DIAGNOSIS — E1149 Type 2 diabetes mellitus with other diabetic neurological complication: Secondary | ICD-10-CM | POA: Diagnosis not present

## 2019-09-22 DIAGNOSIS — E1122 Type 2 diabetes mellitus with diabetic chronic kidney disease: Secondary | ICD-10-CM | POA: Diagnosis not present

## 2019-09-22 DIAGNOSIS — I1310 Hypertensive heart and chronic kidney disease without heart failure, with stage 1 through stage 4 chronic kidney disease, or unspecified chronic kidney disease: Secondary | ICD-10-CM | POA: Diagnosis not present

## 2019-09-22 DIAGNOSIS — I712 Thoracic aortic aneurysm, without rupture: Secondary | ICD-10-CM | POA: Diagnosis not present

## 2019-09-22 DIAGNOSIS — Z0000 Encounter for general adult medical examination without abnormal findings: Secondary | ICD-10-CM | POA: Diagnosis not present

## 2019-09-22 DIAGNOSIS — E1140 Type 2 diabetes mellitus with diabetic neuropathy, unspecified: Secondary | ICD-10-CM | POA: Diagnosis not present

## 2019-10-05 DIAGNOSIS — R269 Unspecified abnormalities of gait and mobility: Secondary | ICD-10-CM | POA: Diagnosis not present

## 2019-10-05 DIAGNOSIS — M6281 Muscle weakness (generalized): Secondary | ICD-10-CM | POA: Diagnosis not present

## 2019-10-06 DIAGNOSIS — G4733 Obstructive sleep apnea (adult) (pediatric): Secondary | ICD-10-CM | POA: Diagnosis not present

## 2019-10-12 DIAGNOSIS — M170 Bilateral primary osteoarthritis of knee: Secondary | ICD-10-CM | POA: Diagnosis not present

## 2019-10-12 DIAGNOSIS — M810 Age-related osteoporosis without current pathological fracture: Secondary | ICD-10-CM | POA: Diagnosis not present

## 2019-11-06 DIAGNOSIS — G4733 Obstructive sleep apnea (adult) (pediatric): Secondary | ICD-10-CM | POA: Diagnosis not present

## 2019-11-14 ENCOUNTER — Other Ambulatory Visit: Payer: Self-pay | Admitting: Internal Medicine

## 2019-11-14 DIAGNOSIS — Z1231 Encounter for screening mammogram for malignant neoplasm of breast: Secondary | ICD-10-CM

## 2019-12-06 DIAGNOSIS — G4733 Obstructive sleep apnea (adult) (pediatric): Secondary | ICD-10-CM | POA: Diagnosis not present

## 2020-01-06 DIAGNOSIS — G4733 Obstructive sleep apnea (adult) (pediatric): Secondary | ICD-10-CM | POA: Diagnosis not present

## 2020-02-05 DIAGNOSIS — G4733 Obstructive sleep apnea (adult) (pediatric): Secondary | ICD-10-CM | POA: Diagnosis not present

## 2020-02-07 ENCOUNTER — Other Ambulatory Visit: Payer: Self-pay

## 2020-02-07 ENCOUNTER — Encounter (HOSPITAL_COMMUNITY): Payer: Self-pay | Admitting: *Deleted

## 2020-02-07 ENCOUNTER — Observation Stay (HOSPITAL_COMMUNITY)
Admission: EM | Admit: 2020-02-07 | Discharge: 2020-02-09 | Disposition: A | Discharge status: Skilled Nursing Facility | Payer: Medicare PPO | Attending: Internal Medicine | Admitting: Internal Medicine

## 2020-02-07 ENCOUNTER — Emergency Department (HOSPITAL_COMMUNITY): Payer: Medicare PPO

## 2020-02-07 DIAGNOSIS — R2689 Other abnormalities of gait and mobility: Secondary | ICD-10-CM | POA: Insufficient documentation

## 2020-02-07 DIAGNOSIS — R9431 Abnormal electrocardiogram [ECG] [EKG]: Secondary | ICD-10-CM | POA: Diagnosis not present

## 2020-02-07 DIAGNOSIS — I959 Hypotension, unspecified: Secondary | ICD-10-CM | POA: Diagnosis not present

## 2020-02-07 DIAGNOSIS — E1149 Type 2 diabetes mellitus with other diabetic neurological complication: Secondary | ICD-10-CM | POA: Diagnosis present

## 2020-02-07 DIAGNOSIS — R627 Adult failure to thrive: Secondary | ICD-10-CM | POA: Diagnosis present

## 2020-02-07 DIAGNOSIS — I10 Essential (primary) hypertension: Secondary | ICD-10-CM | POA: Diagnosis not present

## 2020-02-07 DIAGNOSIS — Z79899 Other long term (current) drug therapy: Secondary | ICD-10-CM | POA: Insufficient documentation

## 2020-02-07 DIAGNOSIS — R509 Fever, unspecified: Secondary | ICD-10-CM | POA: Diagnosis not present

## 2020-02-07 DIAGNOSIS — J449 Chronic obstructive pulmonary disease, unspecified: Secondary | ICD-10-CM | POA: Diagnosis not present

## 2020-02-07 DIAGNOSIS — M6281 Muscle weakness (generalized): Secondary | ICD-10-CM | POA: Insufficient documentation

## 2020-02-07 DIAGNOSIS — E785 Hyperlipidemia, unspecified: Secondary | ICD-10-CM | POA: Diagnosis not present

## 2020-02-07 DIAGNOSIS — Z87891 Personal history of nicotine dependence: Secondary | ICD-10-CM | POA: Insufficient documentation

## 2020-02-07 DIAGNOSIS — E860 Dehydration: Secondary | ICD-10-CM | POA: Diagnosis not present

## 2020-02-07 DIAGNOSIS — R52 Pain, unspecified: Secondary | ICD-10-CM | POA: Diagnosis not present

## 2020-02-07 DIAGNOSIS — E119 Type 2 diabetes mellitus without complications: Secondary | ICD-10-CM | POA: Diagnosis not present

## 2020-02-07 DIAGNOSIS — E872 Acidosis, unspecified: Secondary | ICD-10-CM | POA: Diagnosis present

## 2020-02-07 DIAGNOSIS — I359 Nonrheumatic aortic valve disorder, unspecified: Secondary | ICD-10-CM | POA: Diagnosis present

## 2020-02-07 DIAGNOSIS — Z20822 Contact with and (suspected) exposure to covid-19: Secondary | ICD-10-CM | POA: Diagnosis not present

## 2020-02-07 DIAGNOSIS — R531 Weakness: Principal | ICD-10-CM

## 2020-02-07 DIAGNOSIS — G4733 Obstructive sleep apnea (adult) (pediatric): Secondary | ICD-10-CM | POA: Diagnosis present

## 2020-02-07 DIAGNOSIS — R21 Rash and other nonspecific skin eruption: Secondary | ICD-10-CM | POA: Diagnosis not present

## 2020-02-07 LAB — COMPREHENSIVE METABOLIC PANEL
ALT: 14 U/L (ref 0–44)
AST: 26 U/L (ref 15–41)
Albumin: 4 g/dL (ref 3.5–5.0)
Alkaline Phosphatase: 89 U/L (ref 38–126)
Anion gap: 19 — ABNORMAL HIGH (ref 5–15)
BUN: 28 mg/dL — ABNORMAL HIGH (ref 8–23)
CO2: 19 mmol/L — ABNORMAL LOW (ref 22–32)
Calcium: 8.3 mg/dL — ABNORMAL LOW (ref 8.9–10.3)
Chloride: 103 mmol/L (ref 98–111)
Creatinine, Ser: 0.88 mg/dL (ref 0.44–1.00)
GFR, Estimated: >60 mL/min (ref >60)
Glucose, Bld: 84 mg/dL (ref 70–99)
Potassium: 3.5 mmol/L (ref 3.5–5.1)
Sodium: 141 mmol/L (ref 135–145)
Total Bilirubin: 1.4 mg/dL — ABNORMAL HIGH (ref 0.3–1.2)
Total Protein: 7.7 g/dL (ref 6.5–8.1)

## 2020-02-07 LAB — URINALYSIS, ROUTINE W REFLEX MICROSCOPIC
Bilirubin Urine: NEGATIVE
Glucose, UA: NEGATIVE mg/dL
Hgb urine dipstick: NEGATIVE
Ketones, ur: 5 mg/dL — AB
Nitrite: NEGATIVE
Protein, ur: 30 mg/dL — AB
Specific Gravity, Urine: 1.013 (ref 1.005–1.030)
pH: 6 (ref 5.0–8.0)

## 2020-02-07 LAB — CBC WITH DIFFERENTIAL/PLATELET
Abs Immature Granulocytes: 0.07 10*3/uL (ref 0.00–0.07)
Basophils Absolute: 0.1 10*3/uL (ref 0.0–0.1)
Basophils Relative: 0 %
Eosinophils Absolute: 0 10*3/uL (ref 0.0–0.5)
Eosinophils Relative: 0 %
HCT: 45 % (ref 36.0–46.0)
Hemoglobin: 14.4 g/dL (ref 12.0–15.0)
Immature Granulocytes: 1 %
Lymphocytes Relative: 8 %
Lymphs Abs: 0.9 10*3/uL (ref 0.7–4.0)
MCH: 30 pg (ref 26.0–34.0)
MCHC: 32 g/dL (ref 30.0–36.0)
MCV: 93.8 fL (ref 80.0–100.0)
Monocytes Absolute: 1.4 10*3/uL — ABNORMAL HIGH (ref 0.1–1.0)
Monocytes Relative: 12 %
Neutro Abs: 9.2 10*3/uL — ABNORMAL HIGH (ref 1.7–7.7)
Neutrophils Relative %: 79 %
Platelets: 323 10*3/uL (ref 150–400)
RBC: 4.8 MIL/uL (ref 3.87–5.11)
RDW: 14.7 % (ref 11.5–15.5)
WBC: 11.7 10*3/uL — ABNORMAL HIGH (ref 4.0–10.5)
nRBC: 0 % (ref 0.0–0.2)

## 2020-02-07 LAB — PROTIME-INR
INR: 3.1 — ABNORMAL HIGH (ref 0.8–1.2)
Prothrombin Time: 31.3 seconds — ABNORMAL HIGH (ref 11.4–15.2)

## 2020-02-07 LAB — CK: Total CK: 84 U/L (ref 38–234)

## 2020-02-07 LAB — TROPONIN I (HIGH SENSITIVITY)
Troponin I (High Sensitivity): 6 ng/L (ref <18)
Troponin I (High Sensitivity): 4 ng/L (ref <18)

## 2020-02-07 LAB — LACTIC ACID, PLASMA
Lactic Acid, Venous: 2.2 mmol/L (ref 0.5–1.9)
Lactic Acid, Venous: 1 mmol/L (ref 0.5–1.9)

## 2020-02-07 LAB — RESP PANEL BY RT-PCR (FLU A&B, COVID) ARPGX2
Influenza A by PCR: NEGATIVE
Influenza B by PCR: NEGATIVE
SARS Coronavirus 2 by RT PCR: NEGATIVE

## 2020-02-07 LAB — CBG MONITORING, ED: Glucose-Capillary: 69 mg/dL — ABNORMAL LOW (ref 70–99)

## 2020-02-07 LAB — GLUCOSE, CAPILLARY: Glucose-Capillary: 98 mg/dL (ref 70–99)

## 2020-02-07 MED ORDER — SODIUM CHLORIDE 0.9 % IV BOLUS
1000.0000 mL | Freq: Once | INTRAVENOUS | Status: AC
  Administered 2020-02-07: 18:00:00 1000 mL via INTRAVENOUS

## 2020-02-07 MED ORDER — SODIUM CHLORIDE 0.9 % IV SOLN
2.0000 g | Freq: Once | INTRAVENOUS | Status: AC
  Administered 2020-02-07: 18:00:00 2 g via INTRAVENOUS
  Filled 2020-02-07: qty 2

## 2020-02-07 MED ORDER — SODIUM CHLORIDE 0.9 % IV SOLN
1.0000 g | INTRAVENOUS | Status: DC
  Administered 2020-02-08 – 2020-02-09 (×2): 1 g via INTRAVENOUS
  Filled 2020-02-07 (×2): qty 1

## 2020-02-07 MED ORDER — ONDANSETRON HCL 4 MG PO TABS: 4.0000 mg | ORAL_TABLET | Freq: Four times a day (QID) | ORAL | Status: DC | PRN

## 2020-02-07 MED ORDER — SODIUM CHLORIDE 0.9 % IV SOLN: Freq: Once | INTRAVENOUS | Status: AC

## 2020-02-07 MED ORDER — ACETAMINOPHEN 325 MG PO TABS
650.0000 mg | ORAL_TABLET | Freq: Four times a day (QID) | ORAL | Status: DC | PRN
  Administered 2020-02-08 (×2): 650 mg via ORAL
  Filled 2020-02-07 (×2): qty 2

## 2020-02-07 MED ORDER — INSULIN ASPART 100 UNIT/ML ~~LOC~~ SOLN: 0.0000 [IU] | Freq: Every day | SUBCUTANEOUS | Status: DC

## 2020-02-07 MED ORDER — ATORVASTATIN CALCIUM 40 MG PO TABS
80.0000 mg | ORAL_TABLET | Freq: Every day | ORAL | Status: DC
  Administered 2020-02-08 – 2020-02-09 (×2): 80 mg via ORAL
  Filled 2020-02-07 (×2): qty 2

## 2020-02-07 MED ORDER — ACETAMINOPHEN 650 MG RE SUPP: 650.0000 mg | Freq: Four times a day (QID) | RECTAL | Status: DC | PRN

## 2020-02-07 MED ORDER — ONDANSETRON HCL 4 MG/2ML IJ SOLN: 4.0000 mg | Freq: Four times a day (QID) | INTRAMUSCULAR | Status: DC | PRN

## 2020-02-07 MED ORDER — VANCOMYCIN HCL IN DEXTROSE 1-5 GM/200ML-% IV SOLN
1000.0000 mg | Freq: Once | INTRAVENOUS | Status: AC
  Administered 2020-02-07: 18:00:00 1000 mg via INTRAVENOUS
  Filled 2020-02-07: qty 200

## 2020-02-07 MED ORDER — INSULIN ASPART 100 UNIT/ML ~~LOC~~ SOLN
0.0000 [IU] | Freq: Three times a day (TID) | SUBCUTANEOUS | Status: DC
  Administered 2020-02-08: 12:00:00 1 [IU] via SUBCUTANEOUS

## 2020-02-07 MED ORDER — SODIUM CHLORIDE 0.9 % IV SOLN: INTRAVENOUS | Status: DC

## 2020-02-07 NOTE — ED Provider Notes (Signed)
[facility] DEPT Provider Note   CSN: [id number] Arrival date & time: 02/07/20  1512     History Chief Complaint  Patient presents with  . Weakness    [name] is a 85 y.o. female.  85 year old female with prior medical history as detailed below presents for evaluation.  Patient resides at home by herself.  She arrives today by EMS.  Patient reports 3 to 4 days of subjective fever and worsening weakness.  Patient reports that she is unable to ambulate for the last 2 days.  Patient reports that she has been urinating on herself.  She has been essentially bed bound for the last 2 days. She reports that she has had nothing to drink for the last 24 hours.  She reports that she called her daughter who lives in [place] today who came to the house and then called the ambulance.  Patient is alert and answers questions appropriately.  She complains of generalized weakness.  She denies chest pain or shortness of breath.  She denies cough.  She denies abdominal pain.  The history is provided by the patient.  Weakness Severity:  Severe Onset quality:  Gradual Duration:  4 days Timing:  Constant Progression:  Worsening Chronicity:  New Relieved by:  Nothing Worsened by:  Nothing      Past Medical History:  Diagnosis Date  . Allergic rhinitis   . Arthritis   . Complication of anesthesia    Hard time waking up  . COPD (chronic obstructive pulmonary disease) ([place])    PFT 08/08/09-FEV1 1.98/107; R 0.77; small airway obst w/tresp to dilator;DLCO 44%  . Diabetes mellitus   . Fibromuscular dysplasia (HCC)    RAS  . Hyperlipidemia   . Hypertension   . PONV (postoperative nausea and vomiting)   . Pseudogout   . Sleep apnea    AHI 89/hr    Patient Active Problem List   Diagnosis Date Noted  . Dissection of aorta ([place]) 05/13/2016  . Aortic valve stenosis 05/13/2016  . Aortic atherosclerosis ([place]) 05/13/2016  . Degenerative arthritis of  cervical spine with nerve compression 04/20/2016  . Neck pain 03/16/2016  . Chest wall pain 03/16/2016  . Hypokalemia 03/16/2016  . Nausea & vomiting 03/16/2016  . Polyarticular pseudogout 01/06/2015  . Knee effusion   . Pseudogout 02/14/2014  . Knee effusion, left 05/17/2013  . Fatigue 08/14/2011  . Edema 03/19/2011  . COPD (chronic obstructive pulmonary disease) ([place]) 02/02/2011  . AORTIC VALVE DISORDERS 02/27/2010  . Type 2 diabetes mellitus with neurological complications ([facility]) 63/78/5885  . DYSPNEA 10/29/2009  . ALLERGIC RHINITIS 12/24/2008  . Hyperlipidemia 12/19/2008  . Essential hypertension 12/19/2008  . Obstructive sleep apnea 12/19/2008    Past Surgical History:  Procedure Laterality Date  . BACK SURGERY  2005  . bilateral renal bypass    . ESOPHAGOGASTRODUODENOSCOPY N/A 10/01/2012   Procedure: ESOPHAGOGASTRODUODENOSCOPY (EGD);  Surgeon: Missy Sabins, MD;  Location[facility] ENDOSCOPY;  Service: Endoscopy;  Laterality: N/A;  . nasal septal deviation    . pituitary tumor removed     x 2  . TONSILLECTOMY       OB History   No obstetric history on file.     Family History  Problem Relation Age of Onset  . Stroke Father   . Breast cancer Mother     Social History   Tobacco Use  . Smoking status: Former Smoker    Packs/day: 1.00    Years: 35.00  Pack years: 35.00    Types: Cigarettes    Quit date: 02/17/1997    Years since quitting: 22.9  . Smokeless tobacco: Never Used  Substance Use Topics  . Alcohol use: No  . Drug use: No    Home Medications Prior to Admission medications   Medication Sig Start Date End Date Taking? Authorizing Provider  acetaminophen (TYLENOL) 500 MG tablet Take 2 tablets (1,000 mg total) by mouth every 6 (six) hours as needed for mild pain (or Fever >/= 101). 03/18/16   Patrecia Pour, Christean Grief, MD  amLODipine (NORVASC) 10 MG tablet Take 10 mg by mouth daily.     [provider]  atorvastatin (LIPITOR) 80 MG tablet Take 80 mg  by mouth daily.      [provider]  bismuth subsalicylate (PEPTO BISMOL) 262 MG chewable tablet Chew 524 mg by mouth as needed.    [provider]  fexofenadine (ALLEGRA) 180 MG tablet Take 180 mg by mouth daily.    [provider]  gabapentin (NEURONTIN) 600 MG tablet Take 600 mg by mouth at bedtime.    [provider]  losartan (COZAAR) 50 MG tablet Take 50 mg by mouth daily. 03/11/16   [provider]  pantoprazole (PROTONIX) 40 MG tablet Take 40 mg by mouth 2 (two) times daily. 01/09/14   [provider]  potassium chloride (MICRO-K) 10 MEQ CR capsule Take 10 mEq by mouth daily.    [provider]  traMADol (ULTRAM) 50 MG tablet Take 50 mg by mouth 2 (two) times daily.    [provider]  UNABLE TO FIND Med Name: Med pass 120 mL by mouth daily    [provider]    Allergies    Valacyclovir hcl  Review of Systems   Review of Systems  Neurological: Positive for weakness.    Physical Exam Updated Vital Signs BP (!) 146/64 (BP Location: Left Arm)   Pulse 85   Temp (!) 97.5 F (36.4 C) (Oral)   Resp 17   Ht 5' 2.5" (1.588 m)   Wt 66.7 kg   SpO2 99%   BMI 26.46 kg/m   Physical Exam Vitals and nursing note reviewed.  Constitutional:      General: She is not in acute distress.    Appearance: Normal appearance. She is well-developed and well-nourished.  HENT:     Head: Normocephalic and atraumatic.     Mouth/Throat:     Mouth: Oropharynx is clear and moist.  Eyes:     General:        Right eye: Discharge present.        Left eye: Discharge present.    Extraocular Movements: EOM normal.     Pupils: Pupils are equal, round, and reactive to light.     Comments: Bilateral eyes with inflamed erythematous conjunctivae and mild discharge   Cardiovascular:     Rate and Rhythm: Normal rate and regular rhythm.     Heart sounds: Normal heart sounds.  Pulmonary:     Effort: Pulmonary effort is  normal. No respiratory distress.     Breath sounds: Normal breath sounds.  Abdominal:     General: There is no distension.     Palpations: Abdomen is soft.     Tenderness: There is no abdominal tenderness.  Musculoskeletal:        General: No deformity or edema. Normal range of motion.     Cervical back: Normal range of motion and neck supple.  Skin:    General: Skin is warm and dry.  Neurological:     General: No focal deficit present.     Mental Status: She is alert and oriented to person, place, and time.     Cranial Nerves: No cranial nerve deficit.  Psychiatric:        Mood and Affect: Mood and affect normal.     ED Results / Procedures / Treatments   Labs (all labs ordered are listed, but only abnormal results are displayed) Labs Reviewed  CBC WITH DIFFERENTIAL/PLATELET - Abnormal; Notable for the following components:      Result Value   WBC 11.7 (*)    Neutro Abs 9.2 (*)    Monocytes Absolute 1.4 (*)    All other components within normal limits  COMPREHENSIVE METABOLIC PANEL - Abnormal; Notable for the following components:   CO2 19 (*)    BUN 28 (*)    Calcium 8.3 (*)    Total Bilirubin 1.4 (*)    Anion gap 19 (*)    All other components within normal limits  LACTIC ACID, PLASMA - Abnormal; Notable for the following components:   Lactic Acid, Venous 2.2 (*)    All other components within normal limits  PROTIME-INR - Abnormal; Notable for the following components:   Prothrombin Time 31.3 (*)    INR 3.1 (*)    All other components within normal limits  CBG MONITORING, ED - Abnormal; Notable for the following components:   Glucose-Capillary 69 (*)    All other components within normal limits  RESP PANEL BY RT-PCR (FLU A&B, COVID) ARPGX2  URINE CULTURE  CULTURE, BLOOD (ROUTINE X 2)  CULTURE, BLOOD (ROUTINE X 2)  CK  URINALYSIS, ROUTINE W REFLEX MICROSCOPIC  LACTIC ACID, PLASMA  TYPE AND SCREEN  TROPONIN I (HIGH SENSITIVITY)  TROPONIN I (HIGH SENSITIVITY)     EKG EKG Interpretation  Date/Time:  Tuesday February 07 2020 16:17:26 EST Ventricular Rate:  79 PR Interval:    QRS Duration: 92 QT Interval:  407 QTC Calculation: 467 R Axis:   -8 Text Interpretation: Sinus rhythm Probable anteroseptal infarct, old Confirmed by Dene Gentry [phone]) on 02/07/2020 4:31:39 PM   Radiology DG Chest Port 1 View  Result Date: 02/07/2020 CLINICAL DATA:  85 year old female with fever. EXAM: PORTABLE CHEST 1 VIEW COMPARISON:  Chest radiograph dated 09/30/2012 and CT dated 03/16/2016. FINDINGS: No focal consolidation, pleural effusion, pneumothorax. The cardiac silhouette is within limits. Atherosclerotic calcification of the aorta. Degenerative changes of the spine. Lower thoracic vertebroplasty. No acute osseous pathology. IMPRESSION: No active cardiopulmonary disease. Electronically Signed   By: Anner Crete M.D.   On: 02/07/2020 16:49    Procedures Procedures (including critical care time)  Medications Ordered in ED Medications  0.9 %  sodium chloride infusion (has no administration in time range)  sodium chloride 0.9 % bolus 1,000 mL (0 mLs Intravenous Stopped 02/07/20 1951)  vancomycin (VANCOCIN) IVPB 1000 mg/200 mL premix (0 mg Intravenous Stopped 02/07/20 1951)  ceFEPIme (MAXIPIME) 2 g in sodium chloride 0.9 % 100 mL IVPB (0 g Intravenous Stopped 02/07/20 1951)    ED Course  I have reviewed the triage vital signs and the nursing notes.  Pertinent labs & imaging results that were available during my care of the patient were reviewed by me and considered in my medical decision making (see chart for details).    MDM Rules/Calculators/A&P  MDM  Screen complete  MYCHAELA LENNARTZ was evaluated in Emergency Department on 02/07/2020 for the symptoms described in the history of present illness. She was evaluated in the context of the global COVID-19 pandemic, which necessitated consideration that the patient  might be at risk for infection with the SARS-CoV-2 virus that causes COVID-19. Institutional protocols and algorithms that pertain to the evaluation of patients at risk for COVID-19 are in a state of rapid change based on information released by regulatory bodies including the CDC and federal and state organizations. These policies and algorithms were followed during the patient's care in the ED.  Patient is presenting for evaluation of reported weakness and suspected dehydration.  Patient with reported subjective fevers and mild hypotension on initial EMS evaluation.  Broad-spectrum antibiotics initiated.  Fluid resuscitation initiated.  Patient will require admission for further work-up and treatment.  Dr. Jonelle Sidle of the hospitalist service is aware of case and will evaluate for admission.   Final Clinical Impression(s) / ED Diagnoses Final diagnoses:  Weakness  Dehydration    Rx / DC Orders ED Discharge Orders    None       Valarie Merino, MD 02/07/20 2016

## 2020-02-07 NOTE — ED Notes (Signed)
Date and time results received: 02/07/20 1830 (use smartphrase ".now" to insert current time)  Test: Lactic Acid Critical Value: 2.2  Name of Provider Notified: [place]  Orders Received? Or Actions Taken?:

## 2020-02-07 NOTE — H&P (Signed)
History and Physical   [name] FIE:[id number] DOB: Aug 24, 1934 DOA: 02/07/2020  Referring MD/NP/PA: Dr. Francia Greaves  PCP: Tisovec, Fransico Him, MD   Outpatient Specialists: None  Patient coming from: Home  Chief Complaint: Generalized weakness  HPI: [name] is a 85 y.o. female with medical history significant of COPD, diabetes, hypertension, hyperlipidemia, osteoarthritis, sleep apnea who was brought in by the daughter after patient has been weak unable to move at home.  Patient apparently lives alone.  Her daughter lives in [place] and call patient found out that she was unable to answer her phone.  She was weak and debilitated.  Patient reports that she was having fever up to 103 in the last few days.  It happened about 3 to 4 days ago and then she became progressively weak.  The last 2 days she has not ambulated.  She has been urinating on herself.  She has been mainly bedbound.  Did not eat or drink in the last 24 hours.  Daughter finally came in and had to climb to the back window to get her mom.  EMS brought her to the ER where she is seen and was found to be hypotensive.  Systolic in the eighties.  She has received multiple boluses of IV fluids.  She is afebrile here but has elevated lactic acid and appears very much dehydrated.  Not sure if she had a fall but patient denies that.  She is fully awake and alert.  Patient being admitted with generalized weakness, dehydration and failure to thrive at home..  ED Course: Temperature 97.5 blood pressure 84/68 pulse 89 respiratory 20 oxygen sat 94% room air.  White count 11.7 CO2 19 BUN 28 creatinine 0.88, lactic acid 2.2 and calcium 8.3.  INR is 3.1.  Urinalysis showed trace leukocytes WBC 6-10 a rare bacteria.  Chest x-ray shows no active pulmonary disease and COVID-19 screen is negative.  Patient is being admitted with generalized weakness and dehydration.  Possible UTI.  Review of Systems: As per HPI otherwise 10 point review  of systems negative.    Past Medical History:  Diagnosis Date  . Allergic rhinitis   . Arthritis   . Complication of anesthesia    Hard time waking up  . COPD (chronic obstructive pulmonary disease) ([place])    PFT 08/08/09-FEV1 1.98/107; R 0.77; small airway obst w/tresp to dilator;DLCO 44%  . Diabetes mellitus   . Fibromuscular dysplasia (HCC)    RAS  . Hyperlipidemia   . Hypertension   . PONV (postoperative nausea and vomiting)   . Pseudogout   . Sleep apnea    AHI 89/hr    Past Surgical History:  Procedure Laterality Date  . BACK SURGERY  2005  . bilateral renal bypass    . ESOPHAGOGASTRODUODENOSCOPY N/A 10/01/2012   Procedure: ESOPHAGOGASTRODUODENOSCOPY (EGD);  Surgeon: Missy Sabins, MD;  Location: [facility] ENDOSCOPY;  Service: Endoscopy;  Laterality: N/A;  . nasal septal deviation    . pituitary tumor removed     x 2  . TONSILLECTOMY       reports that she quit smoking about 22 years ago. Her smoking use included cigarettes. She has a 35.00 pack-year smoking history. She has never used smokeless tobacco. She reports that she does not drink alcohol and does not use drugs.  Allergies  Allergen Reactions  . Valacyclovir Hcl Rash    Family History  Problem Relation Age of Onset  . Stroke Father   . Breast cancer Mother  Prior to Admission medications   Medication Sig Start Date End Date Taking? Authorizing Provider  amLODipine (NORVASC) 10 MG tablet Take 10 mg by mouth daily.   Yes [provider]  atorvastatin (LIPITOR) 80 MG tablet Take 80 mg by mouth daily.   Yes [provider]  fexofenadine (ALLEGRA) 180 MG tablet Take 180 mg by mouth daily.   Yes [provider]  gabapentin (NEURONTIN) 600 MG tablet Take 600 mg by mouth at bedtime.   Yes [provider]  losartan (COZAAR) 50 MG tablet Take 50 mg by mouth daily. 03/11/16  Yes [provider]  polyvinyl alcohol (LIQUIFILM TEARS) 1.4 % ophthalmic solution Place 1 drop  into both eyes as needed for dry eyes.   Yes [provider]  traMADol (ULTRAM) 50 MG tablet Take 50 mg by mouth 2 (two) times daily.   Yes [provider]    Physical Exam: Vitals:   02/07/20 1556 02/07/20 1710 02/07/20 1811 02/07/20 2011  BP:  (!) 143/64 134/87 (!) 85/68  Pulse:  85 89 76  Resp:  13 20 14   Temp:      TempSrc:      SpO2:  100% 94% 97%  Weight: 66.7 kg     Height: 5' 2.5" (1.588 m)         Constitutional: Acutely ill looking, weak, no distress Vitals:   02/07/20 1556 02/07/20 1710 02/07/20 1811 02/07/20 2011  BP:  (!) 143/64 134/87 (!) 85/68  Pulse:  85 89 76  Resp:  13 20 14   Temp:      TempSrc:      SpO2:  100% 94% 97%  Weight: 66.7 kg     Height: 5' 2.5" (1.588 m)      Eyes: PERRL, lids and conjunctivae normal ENMT: Mucous membranes are dry. Posterior pharynx clear of any exudate or lesions.Normal dentition.  Neck: normal, supple, no masses, no thyromegaly Respiratory: Coarse breath sounds bilaterally, no wheezing, no crackles. Normal respiratory effort. No accessory muscle use.  Cardiovascular: Regular rate and rhythm, no murmurs / rubs / gallops. No extremity edema. 2+ pedal pulses. No carotid bruits.  Abdomen: no tenderness, no masses palpated. No hepatosplenomegaly. Bowel sounds positive.  Musculoskeletal: no clubbing / cyanosis. No joint deformity upper and lower extremities. Good ROM, no contractures. Normal muscle tone.  Skin: Dry skin with some ecchymosis and some bruising Neurologic: CN 2-12 grossly intact. Sensation intact, DTR normal. Strength 5/5 in all 4.  Psychiatric: Weak, depressed, awake and alert and oriented    Labs on Admission: I have personally reviewed following labs and imaging studies  CBC: Recent Labs  Lab 02/07/20 1707  WBC 11.7*  NEUTROABS 9.2*  HGB 14.4  HCT 45.0  MCV 93.8  PLT 323   Basic Metabolic Panel: Recent Labs  Lab 02/07/20 1707  NA 141  K 3.5  CL 103  CO2 19*  GLUCOSE 84  BUN  28*  CREATININE 0.88  CALCIUM 8.3*   GFR: Estimated Creatinine Clearance: 44 mL/min (by C-G formula based on SCr of 0.88 mg/dL). Liver Function Tests: Recent Labs  Lab 02/07/20 1707  AST 26  ALT 14  ALKPHOS 89  BILITOT 1.4*  PROT 7.7  ALBUMIN 4.0   No results for input(s): LIPASE, AMYLASE in the last 168 hours. No results for input(s): AMMONIA in the last 168 hours. Coagulation Profile: Recent Labs  Lab 02/07/20 1707  INR 3.1*   Cardiac Enzymes: Recent Labs  Lab 02/07/20 1707  CKTOTAL  84   BNP (last 3 results) No results for input(s): PROBNP in the last 8760 hours. HbA1C: No results for input(s): HGBA1C in the last 72 hours. CBG: Recent Labs  Lab 02/07/20 1623  GLUCAP 69*   Lipid Profile: No results for input(s): CHOL, HDL, LDLCALC, TRIG, CHOLHDL, LDLDIRECT in the last 72 hours. Thyroid Function Tests: No results for input(s): TSH, T4TOTAL, FREET4, T3FREE, THYROIDAB in the last 72 hours. Anemia Panel: No results for input(s): VITAMINB12, FOLATE, FERRITIN, TIBC, IRON, RETICCTPCT in the last 72 hours. Urine analysis:    Component Value Date/Time   COLORURINE YELLOW 02/07/2020 1930   APPEARANCEUR CLEAR 02/07/2020 1930   LABSPEC 1.013 02/07/2020 1930   PHURINE 6.0 02/07/2020 1930   GLUCOSEU NEGATIVE 02/07/2020 1930   HGBUR NEGATIVE 02/07/2020 1930   BILIRUBINUR NEGATIVE 02/07/2020 1930   KETONESUR 5 (A) 02/07/2020 1930   PROTEINUR 30 (A) 02/07/2020 1930   NITRITE NEGATIVE 02/07/2020 1930   LEUKOCYTESUR TRACE (A) 02/07/2020 1930   Sepsis Labs: @LABRCNTIP (procalcitonin:4,lacticidven:4) ) Recent Results (from the past 240 hour(s))  Resp Panel by RT-PCR (Flu A&B, Covid) Nasopharyngeal Swab     Status: None   Collection Time: 02/07/20  5:07 PM   Specimen: Nasopharyngeal Swab; Nasopharyngeal(NP) swabs in vial transport medium  Result Value Ref Range Status   SARS Coronavirus 2 by RT PCR NEGATIVE NEGATIVE Final    Comment: (NOTE) SARS-CoV-2 target nucleic  acids are NOT DETECTED.  The SARS-CoV-2 RNA is generally detectable in upper respiratory specimens during the acute phase of infection. The lowest concentration of SARS-CoV-2 viral copies this assay can detect is 138 copies/mL. A negative result does not preclude SARS-Cov-2 infection and should not be used as the sole basis for treatment or other patient management decisions. A negative result may occur with  improper specimen collection/handling, submission of specimen other than nasopharyngeal swab, presence of viral mutation(s) within the areas targeted by this assay, and inadequate number of viral copies(<138 copies/mL). A negative result must be combined with clinical observations, patient history, and epidemiological information. The expected result is Negative.  Fact Sheet for Patients:  02/09/20  Fact Sheet for Healthcare Providers:  BloggerCourse.com  This test is no t yet approved or cleared by the [employer] FDA and  has been authorized for detection and/or diagnosis of SARS-CoV-2 by FDA under an Emergency Use Authorization (EUA). This EUA will remain  in effect (meaning this test can be used) for the duration of the COVID-19 declaration under Section 564(b)(1) of the Act, 21 U.S.C.section 360bbb-3(b)(1), unless the authorization is terminated  or revoked sooner.       Influenza A by PCR NEGATIVE NEGATIVE Final   Influenza B by PCR NEGATIVE NEGATIVE Final    Comment: (NOTE) The Xpert Xpress SARS-CoV-2/FLU/RSV plus assay is intended as an aid in the diagnosis of influenza from Nasopharyngeal swab specimens and should not be used as a sole basis for treatment. Nasal washings and aspirates are unacceptable for Xpert Xpress SARS-CoV-2/FLU/RSV testing.  Fact Sheet for Patients: [place]  Fact Sheet for Healthcare Providers: BloggerCourse.com  This  test is not yet approved or cleared by the SeriousBroker.it FDA and has been authorized for detection and/or diagnosis of SARS-CoV-2 by FDA under an Emergency Use Authorization (EUA). This EUA will remain in effect (meaning this test can be used) for the duration of the COVID-19 declaration under Section 564(b)(1) of the Act, 21 U.S.C. section 360bbb-3(b)(1), unless the authorization is terminated or revoked.  Performed at [facility],  [address], [place], [place] [address]      Radiological Exams on Admission: DG Chest Port 1 View  Result Date: 02/07/2020 CLINICAL DATA:  85 year old female with fever. EXAM: PORTABLE CHEST 1 VIEW COMPARISON:  Chest radiograph dated 09/30/2012 and CT dated 03/16/2016. FINDINGS: No focal consolidation, pleural effusion, pneumothorax. The cardiac silhouette is within limits. Atherosclerotic calcification of the aorta. Degenerative changes of the spine. Lower thoracic vertebroplasty. No acute osseous pathology. IMPRESSION: No active cardiopulmonary disease. Electronically Signed   By: Elgie Collard M.D.   On: 02/07/2020 16:49    EKG: Independently reviewed.  Sinus rhythm with no significant changes  Assessment/Plan Principal Problem:   General weakness Active Problems:   Hyperlipidemia   Essential hypertension   Obstructive sleep apnea   Type 2 diabetes mellitus with neurological complications (HCC)   Aortic valve disorder   COPD (chronic obstructive pulmonary disease) (HCC)   FTT (failure to thrive) in adult   Dehydration   Lactic acidosis   Hypotension     #1 generalized weakness: Most likely multifactorial.  Suspected due to dehydration.  Possible UTI especially with a fever and leukocytosis.  Could also be secondary to failure to thrive.  Patient lives alone.  Will address underlying conditions.  Once patient is stable may likely need skilled placement.  Will get PT and OT consultation.  #2 hypotension: May be a result  of dehydration.  Hold antihypertensive medications and aggressive hydration.  Monitor blood pressure.  #3 essential hypertension: As per above.  #4 diabetes: Sliding scale insulin.  #5 possible UTI: Urinalysis not impressive.  Patient however had subjective fever and has mild leukocytosis.  Empiric Rocephin while urine and blood cultures are pending.  #6 lactic acidosis: Most likely due to dehydration.  Monitor closely.  #7 COPD: No exacerbation.  No home O2.  Continue to monitor  #8 failure to thrive in an adult: Patient most likely will need some form of skilled facility placement.  We will continue planning.  #9 coagulopathy: INR is 3.1.  Not on any warfarin.  No anticoagulation.  We will repeat a monitor.  No active bleed.  Patient also has no known liver disease.   DVT prophylaxis: SCD Code Status: Full code Family Communication: Daughter Disposition Plan: Home Consults called: None but will get PT and OT Admission status: Inpatient  Severity of Illness: The appropriate patient status for this patient is INPATIENT. Inpatient status is judged to be reasonable and necessary in order to provide the required intensity of service to ensure the patient's safety. The patient's presenting symptoms, physical exam findings, and initial radiographic and laboratory data in the context of their chronic comorbidities is felt to place them at high risk for further clinical deterioration. Furthermore, it is not anticipated that the patient will be medically stable for discharge from the hospital within 2 midnights of admission. The following factors support the patient status of inpatient.   " The patient's presenting symptoms include generalized weakness. " The worrisome physical exam findings include dry mucous membranes. " The initial radiographic and laboratory data are worrisome because of leukocytosis. " The chronic co-morbidities include hypertension diabetes.   * I certify that at the  point of admission it is my clinical judgment that the patient will require inpatient hospital care spanning beyond 2 midnights from the point of admission due to high intensity of service, high risk for further deterioration and high frequency of surveillance required.Lonia Blood MD [facility] Pager [phone]  If 7PM-7AM, please contact night-coverage [email or web address] Password [facility]  02/07/2020, 8:29 PM

## 2020-02-07 NOTE — Progress Notes (Signed)
A consult was received from an ED physician for vanc/cefepime per pharmacy dosing.  The patient's profile has been reviewed for ht/wt/allergies/indication/available labs.   A one time order has been placed for vanc 1g and cefepime 2g.  Further antibiotics/pharmacy consults should be ordered by admitting physician if indicated.                       Thank you, Kara Mead 02/07/2020  4:09 PM

## 2020-02-07 NOTE — ED Triage Notes (Addendum)
BIB EMS from home, increased weakness over last 3 days. Pt has been standing beside be using bathroom in middle of floor and going back to bed. Has not been eating last 3 days. Chronic knee pain has become worse. Daughter come to check on mother today, she called EMS. Daughter is coming to the hospital shortly. 90/p- CBG 85- after 250 NS 115/60 - 86- UTO sats. Pt is not a DNR at this time. IV 22 RH

## 2020-02-08 DIAGNOSIS — R531 Weakness: Secondary | ICD-10-CM | POA: Diagnosis not present

## 2020-02-08 LAB — TYPE AND SCREEN
ABO/RH(D): O POS
Antibody Screen: POSITIVE
PT AG Type: NEGATIVE

## 2020-02-08 LAB — HEMOGLOBIN A1C
Hgb A1c MFr Bld: 5.3 % (ref 4.8–5.6)
Mean Plasma Glucose: 105.41 mg/dL

## 2020-02-08 LAB — COMPREHENSIVE METABOLIC PANEL
ALT: 14 U/L (ref 0–44)
AST: 20 U/L (ref 15–41)
Albumin: 3.3 g/dL — ABNORMAL LOW (ref 3.5–5.0)
Alkaline Phosphatase: 76 U/L (ref 38–126)
Anion gap: 10 (ref 5–15)
BUN: 24 mg/dL — ABNORMAL HIGH (ref 8–23)
CO2: 22 mmol/L (ref 22–32)
Calcium: 7.7 mg/dL — ABNORMAL LOW (ref 8.9–10.3)
Chloride: 105 mmol/L (ref 98–111)
Creatinine, Ser: 0.81 mg/dL (ref 0.44–1.00)
GFR, Estimated: >60 mL/min (ref >60)
Glucose, Bld: 102 mg/dL — ABNORMAL HIGH (ref 70–99)
Potassium: 3 mmol/L — ABNORMAL LOW (ref 3.5–5.1)
Sodium: 137 mmol/L (ref 135–145)
Total Bilirubin: 1 mg/dL (ref 0.3–1.2)
Total Protein: 6.3 g/dL — ABNORMAL LOW (ref 6.5–8.1)

## 2020-02-08 LAB — CBC
HCT: 37.3 % (ref 36.0–46.0)
Hemoglobin: 11.9 g/dL — ABNORMAL LOW (ref 12.0–15.0)
MCH: 30.1 pg (ref 26.0–34.0)
MCHC: 31.9 g/dL (ref 30.0–36.0)
MCV: 94.2 fL (ref 80.0–100.0)
Platelets: 275 10*3/uL (ref 150–400)
RBC: 3.96 MIL/uL (ref 3.87–5.11)
RDW: 14.6 % (ref 11.5–15.5)
WBC: 10.6 10*3/uL — ABNORMAL HIGH (ref 4.0–10.5)
nRBC: 0 % (ref 0.0–0.2)

## 2020-02-08 LAB — GLUCOSE, CAPILLARY
Glucose-Capillary: 81 mg/dL (ref 70–99)
Glucose-Capillary: 144 mg/dL — ABNORMAL HIGH (ref 70–99)
Glucose-Capillary: 86 mg/dL (ref 70–99)
Glucose-Capillary: 118 mg/dL — ABNORMAL HIGH (ref 70–99)

## 2020-02-08 LAB — URINE CULTURE: Culture: NO GROWTH

## 2020-02-08 LAB — PROTIME-INR
INR: 1.2 (ref 0.8–1.2)
Prothrombin Time: 14.9 seconds (ref 11.4–15.2)

## 2020-02-08 MED ORDER — POTASSIUM CHLORIDE CRYS ER 20 MEQ PO TBCR
40.0000 meq | EXTENDED_RELEASE_TABLET | ORAL | Status: AC
  Administered 2020-02-08 (×2): 40 meq via ORAL
  Filled 2020-02-08 (×2): qty 2

## 2020-02-08 MED ORDER — MELATONIN 5 MG PO TABS
5.0000 mg | ORAL_TABLET | Freq: Every day | ORAL | Status: DC
  Administered 2020-02-08: 23:00:00 5 mg via ORAL
  Filled 2020-02-08: qty 1

## 2020-02-08 NOTE — Plan of Care (Signed)
Plan of care discussed and reviewed with the patient.

## 2020-02-08 NOTE — Care Management Obs Status (Signed)
[place] NOTIFICATION   Patient Details  Name: [name] MRN: [id number] Date of Birth: 1934-06-06   Medicare Observation Status Notification Given:  Yes    Lennart Pall, [place] 02/08/2020, 11:02 AM

## 2020-02-08 NOTE — Care Management CC44 (Signed)
Condition Code 44 Documentation Completed  Patient Details  Name: [name] MRN: [id number] Date of Birth: October 10, 1934   Condition Code 44 given:  Yes Patient signature on Condition Code 44 notice:  Yes Documentation of 2 MD's agreement:  Yes Code 44 added to claim:  Yes    [name], LCSW 02/08/2020, 11:02 AM

## 2020-02-08 NOTE — TOC Initial Note (Signed)
Transition of Care [facility]) - Initial/Assessment Note    Patient Details  Name: [name] MRN: [id number] Date of Birth: March 30, 1934  [facility] CM/SW Contact:    Lennart Pall, LCSW Phone Number: 02/08/2020, 1:59 PM  Clinical Narrative:                 Met with pt and daughter today to discuss dc planning needs.  Pt confirms that she lives alone in [place] and daughter is living in [place].  Pt describes herself as fully independent at home and still driving. Daughter initially hesitates to bring up her concerns of how pt has been managing at home PTA but pt encourages her to speak openly.  Daughter reports that pt "hasn't been managing well on her own" and explains that pt has simply not been able to adequately keep up the care of her home or of herself.  Daughter notes this has been a concern for several years and they report that pt has been on the waitlist for independent living at [facility] but pt has declined openings several times.  Daughter feels strongly that pt needs to move forward with [facility]. We discussed PT/OT recommendations for SNF rehab and they are both agreeable. Discussed that this would be the initial, short term plan and that they need to follow up with [facility] on longer term move in plans.  Have left a VM for admissions coordinator at [facility] SNF level to see if it might be possible at all to have pt admit to that unit from here (but doubtful).  TOC will begin working on SNF bed and will talk further with [facility] admissions.  Expected Discharge Plan: Skilled Nursing Facility Barriers to Discharge: Continued Medical Work up   Patient Goals and CMS Choice Patient states their goals for this hospitalization and ongoing recovery are:: to regain her independence      Expected Discharge Plan and Services Expected Discharge Plan: [place] In-house Referral: Clinical Social Work   Post Acute  Care Choice: [place] Living arrangements for the past 2 months: Single Family Home                 DME Arranged: N/A DME Agency: NA                  Prior Living Arrangements/Services Living arrangements for the past 2 months: Single Family Home Lives with:: Self Patient language and need for interpreter reviewed:: Yes Do you feel safe going back to the place where you live?: Yes      Need for Family Participation in Patient Care: Yes (Comment) Care giver support system in place?: No (comment)   Criminal Activity/Legal Involvement Pertinent to Current Situation/Hospitalization: No - Comment as needed  Activities of Daily Living Home Assistive Devices/Equipment: Eyeglasses,CPAP ADL Screening (condition at time of admission) Patient's cognitive ability adequate to safely complete daily activities?: Yes Is the patient deaf or have difficulty hearing?: No Does the patient have difficulty seeing, even when wearing glasses/contacts?: No Does the patient have difficulty concentrating, remembering, or making decisions?: No Patient able to express need for assistance with ADLs?: Yes Does the patient have difficulty dressing or bathing?: Yes Independently performs ADLs?: No Communication: Independent Dressing (OT): Needs assistance Is this a change from baseline?: Change from baseline, expected to last >3 days Grooming: Needs assistance Is this a change from baseline?: Change from baseline, expected to last >3 days Feeding: Independent Bathing:  Needs assistance Is this a change from baseline?: Change from baseline, expected to last >3 days Toileting: Needs assistance Is this a change from baseline?: Change from baseline, expected to last >3days In/Out Bed: Needs assistance Is this a change from baseline?: Change from baseline, expected to last >3 days Walks in Home: Needs assistance Is this a change from baseline?: Change from baseline, expected to last >3  days Does the patient have difficulty walking or climbing stairs?: Yes Weakness of Legs: Both Weakness of Arms/Hands: Both  Permission Sought/Granted Permission sought to share information with : Family Chief Financial Officer Permission granted to share information with : Yes, Verbal Permission Granted  Share Information with NAME: Latroya Ng  Permission granted to share info w AGENCY: SNFs  Permission granted to share info w Relationship: daughter  Permission granted to share info w Contact Information: [phone]  Emotional Assessment Appearance:: Appears stated age Attitude/Demeanor/Rapport: Engaged Affect (typically observed): Accepting,Pleasant Orientation: : Oriented to Self,Oriented to Place,Oriented to  Time,Oriented to Situation Alcohol / Substance Use: Not Applicable Psych Involvement: No (comment)  Admission diagnosis:  Dehydration [E86.0] Weakness [R53.1] General weakness [R53.1] Patient Active Problem List   Diagnosis Date Noted   General weakness 02/07/2020   FTT (failure to thrive) in adult 02/07/2020   Dehydration 02/07/2020   Lactic acidosis 02/07/2020   Hypotension 02/07/2020   Dissection of aorta ([place]) 05/13/2016   Aortic valve stenosis 05/13/2016   Aortic atherosclerosis ([place]) 05/13/2016   Degenerative arthritis of cervical spine with nerve compression 04/20/2016   Neck pain 03/16/2016   Chest wall pain 03/16/2016   Hypokalemia 03/16/2016   Nausea & vomiting 03/16/2016   Polyarticular pseudogout 01/06/2015   Knee effusion    Pseudogout 02/14/2014   Knee effusion, left 05/17/2013   Fatigue 08/14/2011   Edema 03/19/2011   COPD (chronic obstructive pulmonary disease) ([place]) 02/02/2011   Aortic valve disorder 02/27/2010   Type 2 diabetes mellitus with neurological complications ([place]) 27/74/1287   DYSPNEA 10/29/2009   ALLERGIC RHINITIS 12/24/2008   Hyperlipidemia 12/19/2008   Essential hypertension  12/19/2008   Obstructive sleep apnea 12/19/2008   PCP:  [name], MD Pharmacy:   [facility] [address], [place] - [address] [place] [address] Phone: [phone] Fax: [phone]  [facility] - [place], [place] - [address] AT [place] [address] [place] [address] Phone: [phone] Fax: [phone]  [facility] [place], [place] [address] AT [facility] [address] [place] [address] Phone: [phone] Fax: [phone]     Social Determinants of Health ([place]) Interventions    Readmission Risk Interventions Readmission Risk Prevention Plan 02/08/2020  Post Dischage Appt Complete  Medication Screening Complete  Transportation Screening Complete  Some recent data might be hidden

## 2020-02-08 NOTE — Progress Notes (Signed)
Occupational Therapy Evaluation  Patient lives alone in [place] with main bed/bath upstairs however uses chair lift to get to second level. Patient independent with ADLs and primarily uses cane for ambulation. Currently patient require mod A for stand pivot transfer to/from [facility], total A for peri care, set up A for UB ADL and max A for LB ADL due to decreased activity tolerance, strength, balance and increase pain in L LE/knee. Recommend continued acute OT services to maximize patient safety and independence with self care in order to facilitate D/C to venue listed below.    02/08/20 1230  OT Visit Information  Last OT Received On 02/08/20  Assistance Needed +1  History of Present Illness 85 y.o. female with medical history significant of COPD, diabetes, hypertension, hyperlipidemia, osteoarthritis, sleep apnea and admitted for generalized weakness with failure to thrive and hypotension  Precautions  Precautions Fall  Restrictions  Weight Bearing Restrictions No  Home Living  Family/patient expects to be discharged to: Private residence  Living Arrangements Alone  Available Help at Discharge Family;Available PRN/intermittently  Type of Home Other(Comment) (condo)  Home Access Stairs to enter  Entrance Stairs-Number of Steps 1 step onto walkway then 2 into condo  Home Layout Two level;1/2 bath on main level  Alternate Level Stairs-Number of Steps has a chair lift to get upstairs  Bathroom Regulatory affairs officer - single point;Walker - 2 wheels  Prior Function  Level of Independence Independent with assistive device(s)  Comments uses cane for ambulation  Communication  Communication No difficulties  Pain Assessment  Pain Assessment Faces  Faces Pain Scale 4  Pain Location knees and lateral left foot  Pain Descriptors / Indicators Aching;Sore  Pain Intervention(s) Monitored during session  Cognition  Arousal/Alertness Awake/alert   Behavior During Therapy WFL for tasks assessed/performed  Overall Cognitive Status Within Functional Limits for tasks assessed  Upper Extremity Assessment  Upper Extremity Assessment Generalized weakness  Lower Extremity Assessment  Lower Extremity Assessment Defer to PT evaluation  ADL  Overall ADL's  Needs assistance/impaired  Eating/Feeding Independent;Sitting  Grooming Set up;Sitting  Upper Body Bathing Set up;Sitting  Lower Body Bathing Maximal assistance;Sitting/lateral leans;Sit to/from stand  Upper Body Dressing  Set up;Sitting  Lower Body Dressing Maximal assistance;Sitting/lateral leans;Sit to/from stand  Lower Body Dressing Details (indicate cue type and reason) decreased standing tolerance, increased pain in knees R>L  Toilet Transfer Moderate assistance;Stand-pivot;Cueing for safety;Cueing for sequencing;BSC;RW  Toilet Transfer Details (indicate cue type and reason) decreased standing tolerance, difficulty advancing L LE during pivot to/from [facility]. patient let go of walker prematurely before lined up with [facility]  Toileting- Clothing Manipulation and Hygiene Total assistance;Sit to/from stand  Toileting - Clothing Manipulation Details (indicate cue type and reason) decreased standing tolerance + balance  Functional mobility during ADLs Moderate assistance;Cueing for safety;Cueing for sequencing;Rolling walker  General ADL Comments patient requiring increased assistance with self care due to knee pain, weakness, decreased balance, activity tolerance and safety awareness  Bed Mobility  General bed mobility comments in recliner  Transfers  Overall transfer level Needs assistance  Equipment used Rolling walker (2 wheeled)  Transfers Sit to/from Stand;Stand Pivot Transfers  Sit to Stand Mod assist  Stand pivot transfers Mod assist  General transfer comment please see toilet transfer in ADL section for details  Balance  Overall balance assessment Needs assistance  Standing balance  support Bilateral upper extremity supported  Standing balance-Leahy Scale Poor  Standing balance comment reliant on UE support  General Comments  General comments (skin integrity, edema, etc.) Pt denies any recent falls  OT - End of Session  Equipment Utilized During Treatment Gait belt;Rolling walker  Activity Tolerance Patient tolerated treatment well  Patient left in chair;with call bell/phone within reach;Other (comment) (CNA aware chair alarm box missing batteries)  Nurse Communication Mobility status  OT Assessment  OT Recommendation/Assessment Patient needs continued OT Services  OT Visit Diagnosis Other abnormalities of gait and mobility (R26.89);Unsteadiness on feet (R26.81);Muscle weakness (generalized) (M62.81)  OT Problem List Decreased strength;Decreased activity tolerance;Impaired balance (sitting and/or standing);Decreased safety awareness;Decreased knowledge of use of DME or AE  OT Plan  OT Frequency (ACUTE ONLY) Min 2X/week  OT Treatment/Interventions (ACUTE ONLY) Self-care/ADL training;Therapeutic activities;Patient/family education;Balance training;DME and/or AE instruction;Therapeutic exercise  AM-PAC OT "6 Clicks" Daily Activity Outcome Measure (Version 2)  Help from another person eating meals? 4  Help from another person taking care of personal grooming? 3  Help from another person toileting, which includes using toliet, bedpan, or urinal? 2  Help from another person bathing (including washing, rinsing, drying)? 2  Help from another person to put on and taking off regular upper body clothing? 3  Help from another person to put on and taking off regular lower body clothing? 2  6 Click Score 16  OT Recommendation  Follow Up Recommendations SNF;Other (comment) (vs HH with 24/7 sup)  OT Equipment Tub/shower seat  Individuals Consulted  Consulted and Agree with Results and Recommendations Patient  Acute Rehab OT Goals  Patient Stated Goal get strength back  OT Goal  Formulation With patient  Time For Goal Achievement 02/22/20  Potential to Achieve Goals Good  OT Time Calculation  OT Start Time (ACUTE ONLY) 1115  OT Stop Time (ACUTE ONLY) 1150  OT Time Calculation (min) 35 min  OT General Charges  $OT Visit 1 Visit  OT Evaluation  $OT Eval Low Complexity 1 Low  OT Treatments  $Self Care/Home Management  8-22 mins  Written Expression  Dominant Hand Right   Marlyce Huge OT OT pager: [phone]

## 2020-02-08 NOTE — Progress Notes (Signed)
PROGRESS NOTE    [name]  ACZ:[id number] DOB: [birth year] DOA: 02/07/2020 PCP: Gaspar Garbe, MD     Brief Narrative:  [name] is a 85 y.o. female with medical history significant of COPD, diabetes, hypertension, hyperlipidemia, osteoarthritis, sleep apnea who was brought in by the daughter after patient has been weak unable to move at home.  Patient apparently lives alone.  Her daughter lives in [place] and called patient found out that she was unable to answer her phone.  She was weak and debilitated.  Patient reports that she was having fever up to 103 in the last few days.  It happened about 3 to 4 days ago and then she became progressively weak.  The last 2 days she has not ambulated.  She has been urinating on herself.  She has been mainly bedbound.  Did not eat or drink in the last 24 hours.  Daughter finally came in and had to climb to the back window to get her mom.  EMS brought her to the ER where she is seen and was found to be hypotensive with systolic in the eighties.  She has received multiple boluses of IV fluids.  She is afebrile here but has elevated lactic acid and appears very much dehydrated. Patient admitted with generalized weakness, dehydration and failure to thrive at home.  New events last 24 hours / Subjective: Patient states that she was able to eat her entire breakfast tray.  She has no focal complaints.  Remains weak.  Assessment & Plan:   Principal Problem:   General weakness Active Problems:   Hyperlipidemia   Essential hypertension   Obstructive sleep apnea   Type 2 diabetes mellitus with neurological complications (HCC)   Aortic valve disorder   COPD (chronic obstructive pulmonary disease) (HCC)   FTT (failure to thrive) in adult   Dehydration   Lactic acidosis   Hypotension    Generalized weakness with failure to thrive -CK 84 -Rule out infectious process, urine culture and blood cultures pending -She will need PT OT  prior to discharge  Hypotension -In combination of dehydration as well as taking antihypertensives at home -Continue to monitor blood pressure, stable this morning 126/61 -Continue IV fluid  Essential hypertension -Antihypertensives on hold  Diabetes mellitus -Well-controlled, hemoglobin A1c 5.3 -Continue SSI  Hyperlipidemia -Continue Lipitor  COPD -Without acute exacerbation  Hypokalemia -Replace, trend   DVT prophylaxis:  SCDs Start: 02/07/20 2132  Code Status: Full code Family Communication: None at bedside, spoke with daughter over the phone this morning  Disposition Plan:  Status is: Observation  The patient remains OBS appropriate and will d/c before 2 midnights.  Dispo: The patient is from: Home              Anticipated d/c is to: To be determined              Anticipated d/c date is: 1 day              Patient currently is not medically stable to d/c.  Await PT OT. Unsafe discharge plan, she lives at home alone with multiple falls and inability to walk safely per daughter's report. Suspect she will need SNF at discharge.       Consultants:   None  Procedures:   None  Antimicrobials:  Anti-infectives (From admission, onward)   Start     Dose/Rate Route Frequency Ordered Stop   02/08/20 0400  cefTRIAXone (ROCEPHIN) 1 g in sodium chloride  0.9 % 100 mL IVPB        1 g 200 mL/hr over 30 Minutes Intravenous Every 24 hours 02/07/20 2157     02/07/20 1615  vancomycin (VANCOCIN) IVPB 1000 mg/200 mL premix        1,000 mg 200 mL/hr over 60 Minutes Intravenous  Once 02/07/20 1610 02/07/20 1951   02/07/20 1615  ceFEPIme (MAXIPIME) 2 g in sodium chloride 0.9 % 100 mL IVPB        2 g 200 mL/hr over 30 Minutes Intravenous  Once 02/07/20 1610 02/07/20 1951        Objective: Vitals:   02/07/20 2021 02/07/20 2133 02/08/20 0216 02/08/20 0600  BP: 133/76 (!) 146/64 131/62 126/61  Pulse: 80 81 88 75  Resp: 14 16 16 16   Temp:  98 F (36.7 C) 98.3 F (36.8  C) 98.4 F (36.9 C)  TempSrc:  Oral Oral Oral  SpO2: 97% 95% 97% 94%  Weight:      Height:        Intake/Output Summary (Last 24 hours) at 02/08/2020 1031 Last data filed at 02/08/2020 0600 Gross per 24 hour  Intake 1286.76 ml  Output 700 ml  Net 586.76 ml   Filed Weights   02/07/20 1556  Weight: 66.7 kg    Examination:  General exam: Appears calm and comfortable  Respiratory system: Clear to auscultation. Respiratory effort normal. No respiratory distress. No conversational dyspnea.  Cardiovascular system: S1 & S2 heard, RRR. No murmurs. No pedal edema. Gastrointestinal system: Abdomen is nondistended, soft and nontender. Normal bowel sounds heard. Central nervous system: Alert and oriented. No focal neurological deficits. Speech clear.  Extremities: Symmetric in appearance  Skin: No rashes, lesions or ulcers on exposed skin  Psychiatry: Judgement and insight appear normal. Mood & affect appropriate.   Data Reviewed: I have personally reviewed following labs and imaging studies  CBC: Recent Labs  Lab 02/07/20 1707 02/08/20 0338  WBC 11.7* 10.6*  NEUTROABS 9.2*  --   HGB 14.4 11.9*  HCT 45.0 37.3  MCV 93.8 94.2  PLT 323 622   Basic Metabolic Panel: Recent Labs  Lab 02/07/20 1707 02/08/20 0338  NA 141 137  K 3.5 3.0*  CL 103 105  CO2 19* 22  GLUCOSE 84 102*  BUN 28* 24*  CREATININE 0.88 0.81  CALCIUM 8.3* 7.7*   GFR: Estimated Creatinine Clearance: 47.8 mL/min (by C-G formula based on SCr of 0.81 mg/dL). Liver Function Tests: Recent Labs  Lab 02/07/20 1707 02/08/20 0338  AST 26 20  ALT 14 14  ALKPHOS 89 76  BILITOT 1.4* 1.0  PROT 7.7 6.3*  ALBUMIN 4.0 3.3*   No results for input(s): LIPASE, AMYLASE in the last 168 hours. No results for input(s): AMMONIA in the last 168 hours. Coagulation Profile: Recent Labs  Lab 02/07/20 1707 02/08/20 0816  INR 3.1* 1.2   Cardiac Enzymes: Recent Labs  Lab 02/07/20 1707  CKTOTAL 84   BNP (last 3  results) No results for input(s): PROBNP in the last 8760 hours. HbA1C: Recent Labs    02/07/20 2221  HGBA1C 5.3   CBG: Recent Labs  Lab 02/07/20 1623 02/07/20 2234 02/08/20 0756  GLUCAP 69* 98 81   Lipid Profile: No results for input(s): CHOL, HDL, LDLCALC, TRIG, CHOLHDL, LDLDIRECT in the last 72 hours. Thyroid Function Tests: No results for input(s): TSH, T4TOTAL, FREET4, T3FREE, THYROIDAB in the last 72 hours. Anemia Panel: No results for input(s): VITAMINB12, FOLATE, FERRITIN, TIBC, IRON, RETICCTPCT  in the last 72 hours. Sepsis Labs: Recent Labs  Lab 02/07/20 1707 02/07/20 2221  LATICACIDVEN 2.2* 1.0    Recent Results (from the past 240 hour(s))  Resp Panel by RT-PCR (Flu A&B, Covid) Nasopharyngeal Swab     Status: None   Collection Time: 02/07/20  5:07 PM   Specimen: Nasopharyngeal Swab; Nasopharyngeal(NP) swabs in vial transport medium  Result Value Ref Range Status   SARS Coronavirus 2 by RT PCR NEGATIVE NEGATIVE Final    Comment: (NOTE) SARS-CoV-2 target nucleic acids are NOT DETECTED.  The SARS-CoV-2 RNA is generally detectable in upper respiratory specimens during the acute phase of infection. The lowest concentration of SARS-CoV-2 viral copies this assay can detect is 138 copies/mL. A negative result does not preclude SARS-Cov-2 infection and should not be used as the sole basis for treatment or other patient management decisions. A negative result may occur with  improper specimen collection/handling, submission of specimen other than nasopharyngeal swab, presence of viral mutation(s) within the areas targeted by this assay, and inadequate number of viral copies(<138 copies/mL). A negative result must be combined with clinical observations, patient history, and epidemiological information. The expected result is Negative.  Fact Sheet for Patients:  EntrepreneurPulse.com.au  Fact Sheet for Healthcare Providers:   IncredibleEmployment.be  This test is no t yet approved or cleared by the [place] FDA and  has been authorized for detection and/or diagnosis of SARS-CoV-2 by FDA under an Emergency Use Authorization (EUA). This EUA will remain  in effect (meaning this test can be used) for the duration of the COVID-19 declaration under Section 564(b)(1) of the Act, 21 U.S.C.section 360bbb-3(b)(1), unless the authorization is terminated  or revoked sooner.       Influenza A by PCR NEGATIVE NEGATIVE Final   Influenza B by PCR NEGATIVE NEGATIVE Final    Comment: (NOTE) The Xpert Xpress SARS-CoV-2/FLU/RSV plus assay is intended as an aid in the diagnosis of influenza from Nasopharyngeal swab specimens and should not be used as a sole basis for treatment. Nasal washings and aspirates are unacceptable for Xpert Xpress SARS-CoV-2/FLU/RSV testing.  Fact Sheet for Patients: EntrepreneurPulse.com.au  Fact Sheet for Healthcare Providers: IncredibleEmployment.be  This test is not yet approved or cleared by the [place] FDA and has been authorized for detection and/or diagnosis of SARS-CoV-2 by FDA under an Emergency Use Authorization (EUA). This EUA will remain in effect (meaning this test can be used) for the duration of the COVID-19 declaration under Section 564(b)(1) of the Act, 21 U.S.C. section 360bbb-3(b)(1), unless the authorization is terminated or revoked.  Performed at [facility], [place] [address], [place], [place] [address]       Radiology Studies: [facility] Chest [place] 1 View  Result Date: 02/07/2020 CLINICAL DATA:  85 year old female with fever. EXAM: PORTABLE CHEST 1 VIEW COMPARISON:  Chest radiograph dated 09/30/2012 and CT dated 03/16/2016. FINDINGS: No focal consolidation, pleural effusion, pneumothorax. The cardiac silhouette is within limits. Atherosclerotic calcification of the aorta. Degenerative  changes of the spine. Lower thoracic vertebroplasty. No acute osseous pathology. IMPRESSION: No active cardiopulmonary disease. Electronically Signed   By: Anner Crete M.D.   On: 02/07/2020 16:49      Scheduled Meds: . atorvastatin  80 mg Oral Daily  . insulin aspart  0-5 Units Subcutaneous QHS  . insulin aspart  0-9 Units Subcutaneous TID WC  . potassium chloride  40 mEq Oral Q4H   Continuous Infusions: . sodium chloride 100 mL/hr at 02/08/20 0831  . cefTRIAXone (  ROCEPHIN)  IV 1 g (02/08/20 0339)     LOS: 1 day      Time spent: 25 minutes   Noralee Stain, DO [facility] 02/08/2020, 10:31 AM   Available via Epic secure chat 7am-7pm After these hours, please refer to coverage provider listed on [email or web address]

## 2020-02-08 NOTE — Evaluation (Signed)
Physical Therapy Evaluation Patient Details Name: [name] MRN: [id number] DOB: 06/25/1934 Today's Date: 02/08/2020   History of Present Illness  85 y.o. female with medical history significant of COPD, diabetes, hypertension, hyperlipidemia, osteoarthritis, sleep apnea and admitted for generalized weakness with failure to thrive and hypotension  Clinical Impression  Pt admitted with above diagnosis. Pt currently with functional limitations due to the deficits listed below (see PT Problem List). Pt will benefit from skilled PT to increase their independence and safety with mobility to allow discharge to the venue listed below.  Pt requiring at least min assist for OOB to recliner today, and pt felt unable to ambulate.  Pt hopeful to d/c home however realizes she needs to be stronger and able to ambulate to go home.  Recommend SNF upon d/c at this time.     Follow Up Recommendations SNF    Equipment Recommendations  None recommended by PT    Recommendations for Other Services       Precautions / Restrictions Precautions Precautions: Fall      Mobility  Bed Mobility Overal bed mobility: Needs Assistance Bed Mobility: Supine to Sit     Supine to sit: Min assist     General bed mobility comments: assist for trunk upright, increased time and effort    Transfers Overall transfer level: Needs assistance Equipment used: Rolling walker (2 wheeled) Transfers: Sit to/from [facility] Sit to Stand: Min assist Stand pivot transfers: Min assist       General transfer comment: verbal cues for hand placement, assist to rise and steady, pt reports left lateral foot pain and bil knee pain ("sore all over") with mobility  Ambulation/Gait             General Gait Details: pt felt unable to ambulate today  Stairs            Wheelchair Mobility    Modified Rankin (Stroke Patients Only)       Balance Overall balance assessment: Needs  assistance         Standing balance support: Bilateral upper extremity supported Standing balance-Leahy Scale: Poor Standing balance comment: reliant on UE support                             Pertinent Vitals/Pain Pain Assessment: Faces Faces Pain Scale: Hurts little more Pain Location: knees and lateral left foot Pain Descriptors / Indicators: Aching;Sore Pain Intervention(s): Repositioned;Monitored during session    Home Living Family/patient expects to be discharged to:: Private residence (condo) Living Arrangements: Alone Available Help at Discharge: Family;Available PRN/intermittently Type of Home: Other(Comment) (condo) Home Access: Stairs to enter   Entrance Stairs-Number of Steps: 1 step onto walkway then 2 into condo Home Layout: Two level;1/2 bath on main level Home Equipment: Cane - single point;Walker - 2 wheels      Prior Function Level of Independence: Independent with assistive device(s)         Comments: uses cane for ambulation     Hand Dominance   Dominant Hand: Right    Extremity/Trunk Assessment        Lower Extremity Assessment Lower Extremity Assessment: Generalized weakness       Communication   Communication: No difficulties  Cognition Arousal/Alertness: Awake/alert Behavior During Therapy: WFL for tasks assessed/performed Overall Cognitive Status: Within Functional Limits for tasks assessed  General Comments General comments (skin integrity, edema, etc.): Pt denies any recent falls    Exercises     Assessment/Plan    PT Assessment Patient needs continued PT services  PT Problem List Decreased strength;Decreased mobility;Decreased activity tolerance;Decreased balance;Decreased knowledge of use of DME;Pain       PT Treatment Interventions DME instruction;Therapeutic exercise;Functional mobility training;Therapeutic activities;Patient/family education;Gait  training;Balance training    PT Goals (Current goals can be found in the Care Plan section)  Acute Rehab PT Goals PT Goal Formulation: With patient Time For Goal Achievement: 02/22/20 Potential to Achieve Goals: Good    Frequency Min 2X/week   Barriers to discharge        Co-evaluation               AM-PAC PT "6 Clicks" Mobility  Outcome Measure Help needed turning from your back to your side while in a flat bed without using bedrails?: A Little Help needed moving from lying on your back to sitting on the side of a flat bed without using bedrails?: A Little Help needed moving to and from a bed to a chair (including a wheelchair)?: A Little Help needed standing up from a chair using your arms (e.g., wheelchair or bedside chair)?: A Little Help needed to walk in hospital room?: A Lot Help needed climbing 3-5 steps with a railing? : A Lot 6 Click Score: 16    End of Session Equipment Utilized During Treatment: Gait belt Activity Tolerance: Patient tolerated treatment well Patient left: with call bell/phone within reach;in chair;with chair alarm set   PT Visit Diagnosis: Other abnormalities of gait and mobility (R26.89)    Time: [id number] PT Time Calculation (min) (ACUTE ONLY): 24 min   Charges:   PT Evaluation $PT Eval Low Complexity: 1 Low PT Treatments $Therapeutic Activity: 8-22 mins       Jannette Spanner PT, DPT Acute Rehabilitation Services Pager: [phone] Office: [phone]  York Ram E 02/08/2020, 12:32 PM

## 2020-02-08 NOTE — Plan of Care (Signed)
Plan of care initiated.

## 2020-02-09 DIAGNOSIS — M2550 Pain in unspecified joint: Secondary | ICD-10-CM | POA: Diagnosis not present

## 2020-02-09 DIAGNOSIS — R5381 Other malaise: Secondary | ICD-10-CM | POA: Diagnosis not present

## 2020-02-09 DIAGNOSIS — J3089 Other allergic rhinitis: Secondary | ICD-10-CM | POA: Diagnosis not present

## 2020-02-09 DIAGNOSIS — R7989 Other specified abnormal findings of blood chemistry: Secondary | ICD-10-CM | POA: Diagnosis not present

## 2020-02-09 DIAGNOSIS — D443 Neoplasm of uncertain behavior of pituitary gland: Secondary | ICD-10-CM | POA: Diagnosis not present

## 2020-02-09 DIAGNOSIS — J449 Chronic obstructive pulmonary disease, unspecified: Secondary | ICD-10-CM | POA: Diagnosis not present

## 2020-02-09 DIAGNOSIS — M79601 Pain in right arm: Secondary | ICD-10-CM | POA: Diagnosis not present

## 2020-02-09 DIAGNOSIS — E785 Hyperlipidemia, unspecified: Secondary | ICD-10-CM | POA: Diagnosis not present

## 2020-02-09 DIAGNOSIS — Z66 Do not resuscitate: Secondary | ICD-10-CM | POA: Diagnosis not present

## 2020-02-09 DIAGNOSIS — Z743 Need for continuous supervision: Secondary | ICD-10-CM | POA: Diagnosis not present

## 2020-02-09 DIAGNOSIS — M1189 Other specified crystal arthropathies, multiple sites: Secondary | ICD-10-CM | POA: Diagnosis not present

## 2020-02-09 DIAGNOSIS — R41841 Cognitive communication deficit: Secondary | ICD-10-CM | POA: Diagnosis not present

## 2020-02-09 DIAGNOSIS — I959 Hypotension, unspecified: Secondary | ICD-10-CM | POA: Diagnosis not present

## 2020-02-09 DIAGNOSIS — I10 Essential (primary) hypertension: Secondary | ICD-10-CM | POA: Diagnosis not present

## 2020-02-09 DIAGNOSIS — Z87891 Personal history of nicotine dependence: Secondary | ICD-10-CM | POA: Diagnosis not present

## 2020-02-09 DIAGNOSIS — E119 Type 2 diabetes mellitus without complications: Secondary | ICD-10-CM | POA: Diagnosis not present

## 2020-02-09 DIAGNOSIS — E1149 Type 2 diabetes mellitus with other diabetic neurological complication: Secondary | ICD-10-CM | POA: Diagnosis not present

## 2020-02-09 DIAGNOSIS — R627 Adult failure to thrive: Secondary | ICD-10-CM | POA: Diagnosis not present

## 2020-02-09 DIAGNOSIS — E876 Hypokalemia: Secondary | ICD-10-CM | POA: Diagnosis not present

## 2020-02-09 DIAGNOSIS — R531 Weakness: Secondary | ICD-10-CM | POA: Diagnosis not present

## 2020-02-09 DIAGNOSIS — I7100 Dissection of unspecified site of aorta: Secondary | ICD-10-CM | POA: Diagnosis not present

## 2020-02-09 DIAGNOSIS — J069 Acute upper respiratory infection, unspecified: Secondary | ICD-10-CM | POA: Diagnosis not present

## 2020-02-09 DIAGNOSIS — M6281 Muscle weakness (generalized): Secondary | ICD-10-CM | POA: Diagnosis not present

## 2020-02-09 DIAGNOSIS — L24A2 Irritant contact dermatitis due to fecal, urinary or dual incontinence: Secondary | ICD-10-CM | POA: Diagnosis not present

## 2020-02-09 DIAGNOSIS — R2689 Other abnormalities of gait and mobility: Secondary | ICD-10-CM | POA: Diagnosis not present

## 2020-02-09 DIAGNOSIS — E237 Disorder of pituitary gland, unspecified: Secondary | ICD-10-CM | POA: Diagnosis not present

## 2020-02-09 DIAGNOSIS — Z7401 Bed confinement status: Secondary | ICD-10-CM | POA: Diagnosis not present

## 2020-02-09 DIAGNOSIS — R2681 Unsteadiness on feet: Secondary | ICD-10-CM | POA: Diagnosis not present

## 2020-02-09 DIAGNOSIS — E039 Hypothyroidism, unspecified: Secondary | ICD-10-CM | POA: Diagnosis not present

## 2020-02-09 DIAGNOSIS — Z20822 Contact with and (suspected) exposure to covid-19: Secondary | ICD-10-CM | POA: Diagnosis not present

## 2020-02-09 DIAGNOSIS — M797 Fibromyalgia: Secondary | ICD-10-CM | POA: Diagnosis not present

## 2020-02-09 DIAGNOSIS — E860 Dehydration: Secondary | ICD-10-CM | POA: Diagnosis not present

## 2020-02-09 LAB — BASIC METABOLIC PANEL
Anion gap: 11 (ref 5–15)
BUN: 21 mg/dL (ref 8–23)
CO2: 19 mmol/L — ABNORMAL LOW (ref 22–32)
Calcium: 7.8 mg/dL — ABNORMAL LOW (ref 8.9–10.3)
Chloride: 108 mmol/L (ref 98–111)
Creatinine, Ser: 0.86 mg/dL (ref 0.44–1.00)
GFR, Estimated: >60 mL/min (ref >60)
Glucose, Bld: 98 mg/dL (ref 70–99)
Potassium: 3.6 mmol/L (ref 3.5–5.1)
Sodium: 138 mmol/L (ref 135–145)

## 2020-02-09 LAB — MAGNESIUM: Magnesium: 1.5 mg/dL — ABNORMAL LOW (ref 1.7–2.4)

## 2020-02-09 LAB — CBC
HCT: 35.6 % — ABNORMAL LOW (ref 36.0–46.0)
Hemoglobin: 11.4 g/dL — ABNORMAL LOW (ref 12.0–15.0)
MCH: 29.7 pg (ref 26.0–34.0)
MCHC: 32 g/dL (ref 30.0–36.0)
MCV: 92.7 fL (ref 80.0–100.0)
Platelets: 316 10*3/uL (ref 150–400)
RBC: 3.84 MIL/uL — ABNORMAL LOW (ref 3.87–5.11)
RDW: 14.8 % (ref 11.5–15.5)
WBC: 9.4 10*3/uL (ref 4.0–10.5)
nRBC: 0 % (ref 0.0–0.2)

## 2020-02-09 LAB — SARS CORONAVIRUS 2 BY RT PCR (HOSPITAL ORDER, PERFORMED IN ~~LOC~~ HOSPITAL LAB): SARS Coronavirus 2: NEGATIVE

## 2020-02-09 LAB — GLUCOSE, CAPILLARY
Glucose-Capillary: 85 mg/dL (ref 70–99)
Glucose-Capillary: 105 mg/dL — ABNORMAL HIGH (ref 70–99)
Glucose-Capillary: 84 mg/dL (ref 70–99)

## 2020-02-09 MED ORDER — MAGNESIUM SULFATE 2 GM/50ML IV SOLN
2.0000 g | Freq: Once | INTRAVENOUS | Status: AC
  Administered 2020-02-09: 09:00:00 2 g via INTRAVENOUS
  Filled 2020-02-09: qty 50

## 2020-02-09 MED ORDER — AMLODIPINE BESYLATE 10 MG PO TABS
10.0000 mg | ORAL_TABLET | Freq: Every day | ORAL | Status: DC
  Administered 2020-02-09: 12:00:00 10 mg via ORAL
  Filled 2020-02-09: qty 1

## 2020-02-09 MED ORDER — LOSARTAN POTASSIUM 50 MG PO TABS
50.0000 mg | ORAL_TABLET | Freq: Every day | ORAL | Status: DC
  Administered 2020-02-09: 12:00:00 50 mg via ORAL
  Filled 2020-02-09: qty 1

## 2020-02-09 NOTE — Progress Notes (Addendum)
PROGRESS NOTE    [name]  DXI:[id number] DOB: September 24, 1934 DOA: 02/07/2020 PCP: Gaspar Garbe, MD     Brief Narrative:  [name] is a 85 y.o. female with medical history significant of COPD, diabetes, hypertension, hyperlipidemia, osteoarthritis, sleep apnea who was brought in by the daughter after patient has been weak unable to move at home.  Patient apparently lives alone.  Her daughter lives in [place] and called patient found out that she was unable to answer her phone.  She was weak and debilitated.  Patient reports that she was having fever up to 103 in the last few days.  It happened about 3 to 4 days ago and then she became progressively weak.  The last 2 days she has not ambulated.  She has been urinating on herself.  She has been mainly bedbound.  Did not eat or drink in the last 24 hours.  Daughter finally came in and had to climb to the back window to get her mom.  EMS brought her to the ER where she is seen and was found to be hypotensive with systolic in the eighties.  She has received multiple boluses of IV fluids.  She is afebrile here but has elevated lactic acid and appears very much dehydrated. Patient admitted with generalized weakness, dehydration and failure to thrive at home.  New events last 24 hours / Subjective: Ate breakfast this morning without any issues.  No complaints today.  Agreeable to SNF placement  Assessment & Plan:   Principal Problem:   General weakness Active Problems:   Hyperlipidemia   Essential hypertension   Obstructive sleep apnea   Type 2 diabetes mellitus with neurological complications (HCC)   Aortic valve disorder   COPD (chronic obstructive pulmonary disease) (HCC)   FTT (failure to thrive) in adult   Dehydration   Lactic acidosis   Hypotension    Generalized weakness with failure to thrive -CK 84 -PT OT recommending SNF placement  Hypotension -In combination of dehydration as well as taking  antihypertensives at home -Continue to monitor blood pressure, stable this morning 145/74  Essential hypertension -Resume Norvasc, Cozaar  Diabetes mellitus -Well-controlled, hemoglobin A1c 5.3 -Continue SSI  Hyperlipidemia -Continue Lipitor  COPD -Without acute exacerbation  Hypomagnesemia -Replace, trend   DVT prophylaxis:  SCDs Start: 02/07/20 2132  Code Status: Full code Family Communication: None at bedside Disposition Plan:  Status is: Observation  The patient remains OBS appropriate and will d/c before 2 midnights.  Dispo: The patient is from: Home              Anticipated d/c is to: SNF              Anticipated d/c date is: 1 day              Patient currently is medically stable to d/c.  Medically stable for discharge as soon as SNF placement is found.      Consultants:   None  Procedures:   None  Antimicrobials:  Anti-infectives (From admission, onward)   Start     Dose/Rate Route Frequency Ordered Stop   02/08/20 0400  cefTRIAXone (ROCEPHIN) 1 g in sodium chloride 0.9 % 100 mL IVPB  Status:  Discontinued        1 g 200 mL/hr over 30 Minutes Intravenous Every 24 hours 02/07/20 2157 02/09/20 0747   02/07/20 1615  vancomycin (VANCOCIN) IVPB 1000 mg/200 mL premix        1,000 mg  200 mL/hr over 60 Minutes Intravenous  Once 02/07/20 1610 02/07/20 1951   02/07/20 1615  ceFEPIme (MAXIPIME) 2 g in sodium chloride 0.9 % 100 mL IVPB        2 g 200 mL/hr over 30 Minutes Intravenous  Once 02/07/20 1610 02/07/20 1951       Objective: Vitals:   02/08/20 0600 02/08/20 1412 02/08/20 2119 02/09/20 0605  BP: 126/61 113/61 135/70 (!) 145/74  Pulse: 75 82 86 80  Resp: 16 18 18 18   Temp: 98.4 F (36.9 C) 98.8 F (37.1 C) 98.1 F (36.7 C) 97.6 F (36.4 C)  TempSrc: Oral Oral Oral Oral  SpO2: 94% 92% 95% 93%  Weight:      Height:        Intake/Output Summary (Last 24 hours) at 02/09/2020 1045 Last data filed at 02/09/2020 1022 Gross per 24 hour   Intake 2642.95 ml  Output 2751 ml  Net -108.05 ml   Filed Weights   02/07/20 1556  Weight: 66.7 kg    Examination: General exam: Appears calm and comfortable  Respiratory system: Clear to auscultation. Respiratory effort normal. Cardiovascular system: S1 & S2 heard, RRR. No pedal edema. Gastrointestinal system: Abdomen is nondistended, soft and nontender. Normal bowel sounds heard. Central nervous system: Alert and oriented. Non focal exam. Speech clear  Extremities: Symmetric in appearance bilaterally  Skin: No rashes, lesions or ulcers on exposed skin  Psychiatry: Judgement and insight appear stable. Mood & affect appropriate.    Data Reviewed: I have personally reviewed following labs and imaging studies  CBC: Recent Labs  Lab 02/07/20 1707 02/08/20 0338 02/09/20 0323  WBC 11.7* 10.6* 9.4  NEUTROABS 9.2*  --   --   HGB 14.4 11.9* 11.4*  HCT 45.0 37.3 35.6*  MCV 93.8 94.2 92.7  PLT 323 275 644   Basic Metabolic Panel: Recent Labs  Lab 02/07/20 1707 02/08/20 0338 02/09/20 0323  NA 141 137 138  K 3.5 3.0* 3.6  CL 103 105 108  CO2 19* 22 19*  GLUCOSE 84 102* 98  BUN 28* 24* 21  CREATININE 0.88 0.81 0.86  CALCIUM 8.3* 7.7* 7.8*  MG  --   --  1.5*   GFR: Estimated Creatinine Clearance: 45 mL/min (by C-G formula based on SCr of 0.86 mg/dL). Liver Function Tests: Recent Labs  Lab 02/07/20 1707 02/08/20 0338  AST 26 20  ALT 14 14  ALKPHOS 89 76  BILITOT 1.4* 1.0  PROT 7.7 6.3*  ALBUMIN 4.0 3.3*   No results for input(s): LIPASE, AMYLASE in the last 168 hours. No results for input(s): AMMONIA in the last 168 hours. Coagulation Profile: Recent Labs  Lab 02/07/20 1707 02/08/20 0816  INR 3.1* 1.2   Cardiac Enzymes: Recent Labs  Lab 02/07/20 1707  CKTOTAL 84   BNP (last 3 results) No results for input(s): PROBNP in the last 8760 hours. HbA1C: Recent Labs    02/07/20 2221  HGBA1C 5.3   CBG: Recent Labs  Lab 02/08/20 0756 02/08/20 1210  02/08/20 1657 02/08/20 2139 02/09/20 0715  GLUCAP 81 144* 86 118* 85   Lipid Profile: No results for input(s): CHOL, HDL, LDLCALC, TRIG, CHOLHDL, LDLDIRECT in the last 72 hours. Thyroid Function Tests: No results for input(s): TSH, T4TOTAL, FREET4, T3FREE, THYROIDAB in the last 72 hours. Anemia Panel: No results for input(s): VITAMINB12, FOLATE, FERRITIN, TIBC, IRON, RETICCTPCT in the last 72 hours. Sepsis Labs: Recent Labs  Lab 02/07/20 1707 02/07/20 2221  LATICACIDVEN 2.2* 1.0  Recent Results (from the past 240 hour(s))  Culture, blood (routine x 2)     Status: None (Preliminary result)   Collection Time: 02/07/20  5:07 PM   Specimen: BLOOD  Result Value Ref Range Status   Specimen Description   Final    BLOOD RIGHT ANTECUBITAL Performed at [facility], [address], [place], [place] [address]    Special Requests   Final    BOTTLES DRAWN AEROBIC AND ANAEROBIC Blood Culture results may not be optimal due to an inadequate volume of blood received in culture bottles Performed at [facility], [address] [place], [place] [address]    Culture   Final    NO GROWTH 2 DAYS Performed at [facility], [address] [place], [place] [address]    Report Status PENDING  Incomplete  Resp Panel by RT-PCR (Flu A&B, Covid) Nasopharyngeal Swab     Status: None   Collection Time: 02/07/20  5:07 PM   Specimen: Nasopharyngeal Swab; Nasopharyngeal(NP) swabs in vial transport medium  Result Value Ref Range Status   SARS Coronavirus 2 by RT PCR NEGATIVE NEGATIVE Final    Comment: (NOTE) SARS-CoV-2 target nucleic acids are NOT DETECTED.  The SARS-CoV-2 RNA is generally detectable in upper respiratory specimens during the acute phase of infection. The lowest concentration of SARS-CoV-2 viral copies this assay can detect is 138 copies/mL. A negative result does not preclude SARS-Cov-2 infection and should not be used as the sole basis  for treatment or other patient management decisions. A negative result may occur with  improper specimen collection/handling, submission of specimen other than nasopharyngeal swab, presence of viral mutation(s) within the areas targeted by this assay, and inadequate number of viral copies(<138 copies/mL). A negative result must be combined with clinical observations, patient history, and epidemiological information. The expected result is Negative.  Fact Sheet for Patients:  BloggerCourse.com  Fact Sheet for Healthcare Providers:  SeriousBroker.it  This test is no t yet approved or cleared by the [place] FDA and  has been authorized for detection and/or diagnosis of SARS-CoV-2 by FDA under an Emergency Use Authorization (EUA). This EUA will remain  in effect (meaning this test can be used) for the duration of the COVID-19 declaration under Section 564(b)(1) of the Act, 21 U.S.C.section 360bbb-3(b)(1), unless the authorization is terminated  or revoked sooner.       Influenza A by PCR NEGATIVE NEGATIVE Final   Influenza B by PCR NEGATIVE NEGATIVE Final    Comment: (NOTE) The Xpert Xpress SARS-CoV-2/FLU/RSV plus assay is intended as an aid in the diagnosis of influenza from Nasopharyngeal swab specimens and should not be used as a sole basis for treatment. Nasal washings and aspirates are unacceptable for Xpert Xpress SARS-CoV-2/FLU/RSV testing.  Fact Sheet for Patients: BloggerCourse.com  Fact Sheet for Healthcare Providers: SeriousBroker.it  This test is not yet approved or cleared by the [place] FDA and has been authorized for detection and/or diagnosis of SARS-CoV-2 by FDA under an Emergency Use Authorization (EUA). This EUA will remain in effect (meaning this test can be used) for the duration of the COVID-19 declaration under Section 564(b)(1) of the Act, 21  U.S.C. section 360bbb-3(b)(1), unless the authorization is terminated or revoked.  Performed at [facility], [address] [place], [place] [address]   Urine culture     Status: None   Collection Time: 02/07/20  7:30 PM   Specimen: Urine, Clean Catch  Result Value  Ref Range Status   Specimen Description   Final    URINE, CLEAN CATCH Performed at [facility], [place] [address], [place], [place] [address]    Special Requests   Final    NONE Performed at [facility], [place] [address] [place], [place] [address]    Culture   Final    NO GROWTH Performed at [facility], [place] [address], [place], [place] [address]    Report Status 02/08/2020 FINAL  Final  Culture, blood (routine x 2)     Status: None (Preliminary result)   Collection Time: 02/07/20  8:29 PM   Specimen: BLOOD  Result Value Ref Range Status   Specimen Description   Final    BLOOD LEFT ANTECUBITAL Performed at [facility] [address], [place], [place] [address]    Special Requests   Final    BOTTLES DRAWN AEROBIC AND ANAEROBIC Blood Culture adequate volume Performed at [facility] [address] [place], [place] [address]    Culture   Final    NO GROWTH 1 DAY Performed at [facility], [place] [address], [place], [place] [address]    Report Status PENDING  Incomplete      Radiology Studies: DG Chest Port 1 View  Result Date: 02/07/2020 CLINICAL DATA:  85 year old female with fever. EXAM: PORTABLE CHEST 1 VIEW COMPARISON:  Chest radiograph dated 09/30/2012 and CT dated 03/16/2016. FINDINGS: No focal consolidation, pleural effusion, pneumothorax. The cardiac silhouette is within limits. Atherosclerotic calcification of the aorta. Degenerative changes of the spine. Lower thoracic vertebroplasty. No acute osseous pathology. IMPRESSION: No active cardiopulmonary disease. Electronically Signed   By: Anner Crete M.D.   On: 02/07/2020 16:49      Scheduled Meds: . atorvastatin  80 mg Oral Daily  . insulin aspart  0-5 Units Subcutaneous QHS  . insulin aspart  0-9 Units Subcutaneous TID WC  . melatonin  5 mg Oral QHS   Continuous Infusions:    LOS: 1 day      Time spent: 25 minutes   Dessa Phi, DO [facility] 02/09/2020, 10:45 AM   Available via Epic secure chat 7am-7pm After these hours, please refer to coverage provider listed on [email or web address]

## 2020-02-09 NOTE — Progress Notes (Signed)
Pt left the unit in stretcher with PTAR in stable condition. Daughter [name] is notified.

## 2020-02-09 NOTE — TOC Transition Note (Signed)
Transition of Care [facility]) - CM/SW Discharge Note   Patient Details  Name: [name] MRN: [id number] Date of Birth: [birth year]  [facility] CM/SW Contact:  Lennart Pall, LCSW Phone Number: 02/09/2020, 1:06 PM   Clinical Narrative:     Have reviewed SNF bed offers with pt and she has accepted bed at [facility].  Medically cleared for admit today.  Insurance auth received via [employer].  Pt to transfer to facility today via [place].   RN to call report to [phone].  Final next level of care: [place] Barriers to Discharge: Barriers Resolved   Patient Goals and CMS Choice Patient states their goals for this hospitalization and ongoing recovery are:: to regain her independence      Discharge Placement   Existing PASRR number confirmed : 02/08/20          Patient [facility] bed at: [facility] Patient to be transferred to facility by: [place] Name of family member notified: daughter, [name] Patient and family notified of of transfer: 02/09/20  Discharge Plan and Services In-house Referral: Clinical Social Work   Post Acute Care Choice: [place]          DME Arranged: N/A DME Agency: NA                  Social Determinants of Health (SDOH) Interventions     Readmission Risk Interventions Readmission Risk Prevention Plan 02/08/2020  Post Dischage Appt Complete  Medication Screening Complete  Transportation Screening Complete  Some recent data might be hidden

## 2020-02-09 NOTE — NC FL2 (Signed)
[place] LEVEL OF CARE SCREENING TOOL     IDENTIFICATION  Patient Name: [name] Birthdate: [birth year] Sex: female Admission Date (Current Location): 02/07/2020  [facility] [place] Number:  Herbalist and Address:  [facility],  [place], [place]      Provider Number: [id number]  Attending Physician Name and Address:  Dessa Phi, DO  Relative Name and Phone Number:  daughter, [name] @ [phone]    Current Level of Care: Hospital Recommended Level of Care: [place] Prior Approval Number:    Date Approved/Denied:   PASRR Number: [id number]  Discharge Plan: SNF    Current Diagnoses: Patient Active Problem List   Diagnosis Date Noted  . General weakness 02/07/2020  . FTT (failure to thrive) in adult 02/07/2020  . Dehydration 02/07/2020  . Lactic acidosis 02/07/2020  . Hypotension 02/07/2020  . Dissection of aorta ([place]) 05/13/2016  . Aortic valve stenosis 05/13/2016  . Aortic atherosclerosis ([place]) 05/13/2016  . Degenerative arthritis of cervical spine with nerve compression 04/20/2016  . Neck pain 03/16/2016  . Chest wall pain 03/16/2016  . Hypokalemia 03/16/2016  . Nausea & vomiting 03/16/2016  . Polyarticular pseudogout 01/06/2015  . Knee effusion   . Pseudogout 02/14/2014  . Knee effusion, left 05/17/2013  . Fatigue 08/14/2011  . Edema 03/19/2011  . COPD (chronic obstructive pulmonary disease) ([place]) 02/02/2011  . Aortic valve disorder 02/27/2010  . Type 2 diabetes mellitus with neurological complications ([place]) [id number]  . DYSPNEA 10/29/2009  . ALLERGIC RHINITIS 12/24/2008  . Hyperlipidemia 12/19/2008  . Essential hypertension 12/19/2008  . Obstructive sleep apnea 12/19/2008    Orientation RESPIRATION BLADDER Height & Weight     Self,Time,Situation,Place  Normal Continent Weight: 147 lb (66.7 kg) Height:  5' 2.5" (158.8 cm)  BEHAVIORAL SYMPTOMS/MOOD NEUROLOGICAL  BOWEL NUTRITION STATUS      Continent    AMBULATORY STATUS COMMUNICATION OF NEEDS Skin   Extensive Assist   Normal                       Personal Care Assistance Level of Assistance  Bathing,Dressing Bathing Assistance: Limited assistance   Dressing Assistance: Limited assistance     Functional Limitations Info             SPECIAL CARE FACTORS FREQUENCY  PT (By licensed PT),OT (By licensed OT)     PT Frequency: 5x/wk OT Frequency: 5x/wk            Contractures Contractures Info: Not present    Additional Factors Info                  Current Medications (02/09/2020):  This is the current hospital active medication list Current Facility-Administered Medications  Medication Dose Route Frequency Provider Last Rate Last Admin  . acetaminophen (TYLENOL) tablet 650 mg  650 mg Oral Q6H PRN Elwyn Reach, MD   650 mg at 02/08/20 1237   Or  . acetaminophen (TYLENOL) suppository 650 mg  650 mg Rectal Q6H PRN Elwyn Reach, MD      . amLODipine (NORVASC) tablet 10 mg  10 mg Oral Daily Dessa Phi, DO   10 mg at 02/09/20 1225  . atorvastatin (LIPITOR) tablet 80 mg  80 mg Oral Daily Elwyn Reach, MD   80 mg at 02/09/20 0834  . insulin aspart (novoLOG) injection 0-5 Units  0-5 Units Subcutaneous QHS Elwyn Reach, MD      .  insulin aspart (novoLOG) injection 0-9 Units  0-9 Units Subcutaneous TID WC Elwyn Reach, MD   1 Units at 02/08/20 1220  . losartan (COZAAR) tablet 50 mg  50 mg Oral Daily Dessa Phi, DO   50 mg at 02/09/20 1225  . melatonin tablet 5 mg  5 mg Oral QHS Sharion Settler, NP   5 mg at 02/08/20 2239  . ondansetron (ZOFRAN) tablet 4 mg  4 mg Oral Q6H PRN Elwyn Reach, MD       Or  . ondansetron (ZOFRAN) injection 4 mg  4 mg Intravenous Q6H PRN Elwyn Reach, MD         Discharge Medications: Please see discharge summary for a list of discharge medications.  Relevant Imaging Results:  Relevant Lab  Results:   Additional Information SSN: [id number]  Lennart Pall, LCSW

## 2020-02-09 NOTE — Progress Notes (Signed)
Verbal report given to RN at [facility].

## 2020-02-09 NOTE — Plan of Care (Signed)

## 2020-02-09 NOTE — Discharge Summary (Signed)
Physician Discharge Summary  [name] TFT:[id number] DOB: August 10, 1934 DOA: 02/07/2020  PCP: [name], MD  Admit date: 02/07/2020 Discharge date: 02/09/2020  Admitted From: Home Disposition:  SNF   Recommendations for Outpatient Follow-up:  1. Follow up with PCP in 1 week  Discharge Condition: Stable CODE STATUS: Full  Diet recommendation:  Diet Orders (From admission, onward)    Start     Ordered   02/09/20 0000  Diet - low sodium heart healthy        02/09/20 1224   02/07/20 2132  Diet heart healthy/carb modified Room service appropriate? Yes; Fluid consistency: Thin  Diet effective now       Question Answer Comment  Diet-HS Snack? Nothing   Room service appropriate? Yes   Fluid consistency: Thin      02/07/20 2131          Brief/Interim Summary: [name] a 85 y.o.femalewith medical history significant ofCOPD, diabetes, hypertension, hyperlipidemia, osteoarthritis, sleep apnea who was brought in by the daughter after patient has been weak unable to move at home. Patient apparently lives alone. Her daughter lives in [place] and called patient found out that she was unable to answer her phone. She was weak and debilitated. Patient reports that she was having fever up to 103in the last few days. It happened about 3 to 4 days ago and then she became progressively weak. The last 2 days she has not ambulated. She has been urinating on herself. She has been mainly bedbound. Did not eat or drink in the last 24 hours. Daughter finally came in and had to climb to the back window to get her mom. EMS brought her to the ER where she is seen and was found to be hypotensive with systolic in the eighties. She has received multiple boluses of IV fluids. She is afebrile here but has elevated lactic acid and appears very much dehydrated. Patient admitted with generalized weakness, dehydration and failure to thrive at home.  Discharge Diagnoses:   Principal Problem:   General weakness Active Problems:   Hyperlipidemia   Essential hypertension   Obstructive sleep apnea   Type 2 diabetes mellitus with neurological complications (HCC)   Aortic valve disorder   COPD (chronic obstructive pulmonary disease) (HCC)   FTT (failure to thrive) in adult   Dehydration   Lactic acidosis   Hypotension   Generalized weakness with failure to thrive -CK 84 -PT OT recommending SNF placement  Hypotension -In combination of dehydration as well as taking antihypertensives at home -Continue to monitor blood pressure, stable this morning 145/74  Essential hypertension -Resume Norvasc, Cozaar  Diabetes mellitus -Well-controlled, hemoglobin A1c 5.3  Hyperlipidemia -Continue Lipitor  COPD -Without acute exacerbation  Hypomagnesemia -Replaced   Discharge Instructions  Discharge Instructions    Diet - low sodium heart healthy   Complete by: As directed    Increase activity slowly   Complete by: As directed      Allergies as of 02/09/2020      Reactions   Valacyclovir Hcl Rash      Medication List    STOP taking these medications   traMADol 50 MG tablet Commonly known as: ULTRAM     TAKE these medications   amLODipine 10 MG tablet Commonly known as: NORVASC Take 10 mg by mouth daily.   atorvastatin 80 MG tablet Commonly known as: LIPITOR Take 80 mg by mouth daily.   fexofenadine 180 MG tablet Commonly known as: ALLEGRA Take 180 mg  by mouth daily.   gabapentin 600 MG tablet Commonly known as: NEURONTIN Take 600 mg by mouth at bedtime.   losartan 50 MG tablet Commonly known as: COZAAR Take 50 mg by mouth daily.   polyvinyl alcohol 1.4 % ophthalmic solution Commonly known as: LIQUIFILM TEARS Place 1 drop into both eyes as needed for dry eyes.       Contact information for follow-up providers    Tisovec, Adelfa Koh, MD Follow up.   Specialty: Internal Medicine Contact information: [address] [place] [address] [phone]            Contact information for after-discharge care    Destination    [facility] Preferred SNF .   Service: Skilled Nursing Contact information: [address] [place] [address] [phone]                 Allergies  Allergen Reactions  . Valacyclovir Hcl Rash    Consultations:  None   Procedures/Studies: DG Chest Port 1 View  Result Date: 02/07/2020 CLINICAL DATA:  85 year old female with fever. EXAM: PORTABLE CHEST 1 VIEW COMPARISON:  Chest radiograph dated 09/30/2012 and CT dated 03/16/2016. FINDINGS: No focal consolidation, pleural effusion, pneumothorax. The cardiac silhouette is within limits. Atherosclerotic calcification of the aorta. Degenerative changes of the spine. Lower thoracic vertebroplasty. No acute osseous pathology. IMPRESSION: No active cardiopulmonary disease. Electronically Signed   By: Elgie Collard M.D.   On: 02/07/2020 16:49       Discharge Exam: Vitals:   02/08/20 2119 02/09/20 0605  BP: 135/70 (!) 145/74  Pulse: 86 80  Resp: 18 18  Temp: 98.1 F (36.7 C) 97.6 F (36.4 C)  SpO2: 95% 93%    General: Pt is alert, awake, not in acute distress Cardiovascular: RRR, S1/S2 +, no edema Respiratory: CTA bilaterally, no wheezing, no rhonchi, no respiratory distress, no conversational dyspnea  Abdominal: Soft, NT, ND, bowel sounds + Extremities: no edema, no cyanosis Psych: Normal mood and affect, stable judgement and insight     The results of significant diagnostics from this hospitalization (including imaging, microbiology, ancillary and laboratory) are listed below for reference.     Microbiology: Recent Results (from the past 240 hour(s))  Culture, blood (routine x 2)     Status: None (Preliminary result)   Collection Time: 02/07/20  5:07 PM   Specimen: BLOOD  Result Value Ref Range Status   Specimen Description   Final    BLOOD RIGHT  ANTECUBITAL Performed at [facility], [address] [place], [place] [address]    Special Requests   Final    BOTTLES DRAWN AEROBIC AND ANAEROBIC Blood Culture results may not be optimal due to an inadequate volume of blood received in culture bottles Performed at [facility], [address], [place], [place] [address]    Culture   Final    NO GROWTH 2 DAYS Performed at [facility], [address] [place], [place] [address]    Report Status PENDING  Incomplete  Resp Panel by RT-PCR (Flu A&B, Covid) Nasopharyngeal Swab     Status: None   Collection Time: 02/07/20  5:07 PM   Specimen: Nasopharyngeal Swab; Nasopharyngeal(NP) swabs in vial transport medium  Result Value Ref Range Status   SARS Coronavirus 2 by RT PCR NEGATIVE NEGATIVE Final    Comment: (NOTE) SARS-CoV-2 target nucleic acids are NOT DETECTED.  The SARS-CoV-2 RNA is generally detectable in upper respiratory  specimens during the acute phase of infection. The lowest concentration of SARS-CoV-2 viral copies this assay can detect is 138 copies/mL. A negative result does not preclude SARS-Cov-2 infection and should not be used as the sole basis for treatment or other patient management decisions. A negative result may occur with  improper specimen collection/handling, submission of specimen other than nasopharyngeal swab, presence of viral mutation(s) within the areas targeted by this assay, and inadequate number of viral copies(<138 copies/mL). A negative result must be combined with clinical observations, patient history, and epidemiological information. The expected result is Negative.  Fact Sheet for Patients:  EntrepreneurPulse.com.au  Fact Sheet for Healthcare Providers:  IncredibleEmployment.be  This test is no t yet approved or cleared by the [place] FDA and  has been authorized for detection and/or diagnosis of SARS-CoV-2  by FDA under an Emergency Use Authorization (EUA). This EUA will remain  in effect (meaning this test can be used) for the duration of the COVID-19 declaration under Section 564(b)(1) of the Act, 21 U.S.C.section 360bbb-3(b)(1), unless the authorization is terminated  or revoked sooner.       Influenza A by PCR NEGATIVE NEGATIVE Final   Influenza B by PCR NEGATIVE NEGATIVE Final    Comment: (NOTE) The Xpert Xpress SARS-CoV-2/FLU/RSV plus assay is intended as an aid in the diagnosis of influenza from Nasopharyngeal swab specimens and should not be used as a sole basis for treatment. Nasal washings and aspirates are unacceptable for Xpert Xpress SARS-CoV-2/FLU/RSV testing.  Fact Sheet for Patients: EntrepreneurPulse.com.au  Fact Sheet for Healthcare Providers: IncredibleEmployment.be  This test is not yet approved or cleared by the [place] FDA and has been authorized for detection and/or diagnosis of SARS-CoV-2 by FDA under an Emergency Use Authorization (EUA). This EUA will remain in effect (meaning this test can be used) for the duration of the COVID-19 declaration under Section 564(b)(1) of the Act, 21 U.S.C. section 360bbb-3(b)(1), unless the authorization is terminated or revoked.  Performed at [facility], [place] [address] [place], [place] [address]   Urine culture     Status: None   Collection Time: 02/07/20  7:30 PM   Specimen: Urine, Clean Catch  Result Value Ref Range Status   Specimen Description   Final    URINE, CLEAN CATCH Performed at [facility], [place] [address] [place], [place] [address]    Special Requests   Final    NONE Performed at [facility], [place] [address], [place], [place] [address]    Culture   Final    NO GROWTH Performed at [facility], [place] [address] [place], [place] [address]    Report Status 02/08/2020 FINAL  Final  Culture, blood  (routine x 2)     Status: None (Preliminary result)   Collection Time: 02/07/20  8:29 PM   Specimen: BLOOD  Result Value Ref Range Status   Specimen Description   Final    BLOOD LEFT ANTECUBITAL Performed at [place] [address], [place], [place] [address]    Special Requests   Final    BOTTLES DRAWN AEROBIC AND ANAEROBIC Blood Culture adequate volume Performed at [place] [address] [place], [place] [address]    Culture   Final    NO GROWTH 1 DAY Performed at [facility], [place] [address] [place],  [address]    Report Status PENDING  Incomplete     Labs: BNP (last  3 results) No results for input(s): BNP in the last 8760 hours. Basic Metabolic Panel: Recent Labs  Lab 02/07/20 1707 02/08/20 0338 02/09/20 0323  NA 141 137 138  K 3.5 3.0* 3.6  CL 103 105 108  CO2 19* 22 19*  GLUCOSE 84 102* 98  BUN 28* 24* 21  CREATININE 0.88 0.81 0.86  CALCIUM 8.3* 7.7* 7.8*  MG  --   --  1.5*   Liver Function Tests: Recent Labs  Lab 02/07/20 1707 02/08/20 0338  AST 26 20  ALT 14 14  ALKPHOS 89 76  BILITOT 1.4* 1.0  PROT 7.7 6.3*  ALBUMIN 4.0 3.3*   No results for input(s): LIPASE, AMYLASE in the last 168 hours. No results for input(s): AMMONIA in the last 168 hours. CBC: Recent Labs  Lab 02/07/20 1707 02/08/20 0338 02/09/20 0323  WBC 11.7* 10.6* 9.4  NEUTROABS 9.2*  --   --   HGB 14.4 11.9* 11.4*  HCT 45.0 37.3 35.6*  MCV 93.8 94.2 92.7  PLT 323 275 316   Cardiac Enzymes: Recent Labs  Lab 02/07/20 1707  CKTOTAL 84   BNP: Invalid input(s): POCBNP CBG: Recent Labs  Lab 02/08/20 1210 02/08/20 1657 02/08/20 2139 02/09/20 0715 02/09/20 1217  GLUCAP 144* 86 118* 85 105*   D-Dimer No results for input(s): DDIMER in the last 72 hours. Hgb A1c Recent Labs    02/07/20 2221  HGBA1C 5.3   Lipid Profile No results for input(s): CHOL, HDL, LDLCALC, TRIG, CHOLHDL, LDLDIRECT in the last 72  hours. Thyroid function studies No results for input(s): TSH, T4TOTAL, T3FREE, THYROIDAB in the last 72 hours.  Invalid input(s): FREET3 Anemia work up No results for input(s): VITAMINB12, FOLATE, FERRITIN, TIBC, IRON, RETICCTPCT in the last 72 hours. Urinalysis    Component Value Date/Time   COLORURINE YELLOW 02/07/2020 1930   APPEARANCEUR CLEAR 02/07/2020 1930   LABSPEC 1.013 02/07/2020 1930   PHURINE 6.0 02/07/2020 1930   GLUCOSEU NEGATIVE 02/07/2020 1930   HGBUR NEGATIVE 02/07/2020 1930   BILIRUBINUR NEGATIVE 02/07/2020 1930   KETONESUR 5 (A) 02/07/2020 1930   PROTEINUR 30 (A) 02/07/2020 1930   NITRITE NEGATIVE 02/07/2020 1930   LEUKOCYTESUR TRACE (A) 02/07/2020 1930   Sepsis Labs Invalid input(s): PROCALCITONIN,  WBC,  LACTICIDVEN Microbiology Recent Results (from the past 240 hour(s))  Culture, blood (routine x 2)     Status: None (Preliminary result)   Collection Time: 02/07/20  5:07 PM   Specimen: BLOOD  Result Value Ref Range Status   Specimen Description   Final    BLOOD RIGHT ANTECUBITAL Performed at [facility], [address] [place], [place] [address]    Special Requests   Final    BOTTLES DRAWN AEROBIC AND ANAEROBIC Blood Culture results may not be optimal due to an inadequate volume of blood received in culture bottles Performed at [facility], [address] [place], [place] [address]    Culture   Final    NO GROWTH 2 DAYS Performed at [facility], [address] [place], [place] [address]    Report Status PENDING  Incomplete  Resp Panel by RT-PCR (Flu A&B, Covid) Nasopharyngeal Swab     Status: None   Collection Time: 02/07/20  5:07 PM   Specimen: Nasopharyngeal Swab; Nasopharyngeal(NP) swabs in vial transport medium  Result Value Ref Range Status   SARS Coronavirus 2 by RT PCR NEGATIVE NEGATIVE Final    Comment: (NOTE) SARS-CoV-2 target nucleic acids are  NOT DETECTED.  The SARS-CoV-2 RNA is  generally detectable in upper respiratory specimens during the acute phase of infection. The lowest concentration of SARS-CoV-2 viral copies this assay can detect is 138 copies/mL. A negative result does not preclude SARS-Cov-2 infection and should not be used as the sole basis for treatment or other patient management decisions. A negative result may occur with  improper specimen collection/handling, submission of specimen other than nasopharyngeal swab, presence of viral mutation(s) within the areas targeted by this assay, and inadequate number of viral copies(<138 copies/mL). A negative result must be combined with clinical observations, patient history, and epidemiological information. The expected result is Negative.  Fact Sheet for Patients:  BloggerCourse.com  Fact Sheet for Healthcare Providers:  SeriousBroker.it  This test is no t yet approved or cleared by the [place] FDA and  has been authorized for detection and/or diagnosis of SARS-CoV-2 by FDA under an Emergency Use Authorization (EUA). This EUA will remain  in effect (meaning this test can be used) for the duration of the COVID-19 declaration under Section 564(b)(1) of the Act, 21 U.S.C.section 360bbb-3(b)(1), unless the authorization is terminated  or revoked sooner.       Influenza A by PCR NEGATIVE NEGATIVE Final   Influenza B by PCR NEGATIVE NEGATIVE Final    Comment: (NOTE) The Xpert Xpress SARS-CoV-2/FLU/RSV plus assay is intended as an aid in the diagnosis of influenza from Nasopharyngeal swab specimens and should not be used as a sole basis for treatment. Nasal washings and aspirates are unacceptable for Xpert Xpress SARS-CoV-2/FLU/RSV testing.  Fact Sheet for Patients: BloggerCourse.com  Fact Sheet for Healthcare Providers: SeriousBroker.it  This test is not yet approved or cleared by the [place] FDA and has been authorized for detection and/or diagnosis of SARS-CoV-2 by FDA under an Emergency Use Authorization (EUA). This EUA will remain in effect (meaning this test can be used) for the duration of the COVID-19 declaration under Section 564(b)(1) of the Act, 21 U.S.C. section 360bbb-3(b)(1), unless the authorization is terminated or revoked.  Performed at [facility], [address] [place], [place] [address]   Urine culture     Status: None   Collection Time: 02/07/20  7:30 PM   Specimen: Urine, Clean Catch  Result Value Ref Range Status   Specimen Description   Final    URINE, CLEAN CATCH Performed at [facility], [address], [place], [place] [address]    Special Requests   Final    NONE Performed at [facility], [address], [place], [place] [address]    Culture   Final    NO GROWTH Performed at [facility], [address] [place], [place] [address]    Report Status 02/08/2020 FINAL  Final  Culture, blood (routine x 2)     Status: None (Preliminary result)   Collection Time: 02/07/20  8:29 PM   Specimen: BLOOD  Result Value Ref Range Status   Specimen Description   Final    BLOOD LEFT ANTECUBITAL Performed at [facility], [address] [place], [place] [address]    Special Requests   Final    BOTTLES DRAWN AEROBIC AND ANAEROBIC Blood Culture adequate volume Performed at [facility], [address] [place], [place] [address]    Culture   Final    NO GROWTH 1 DAY Performed at [facility], [address], [place], [place] [address]  Report Status PENDING  Incomplete     Patient was seen and examined on the day of discharge and was found to be in stable condition. Time coordinating discharge: 25 minutes including assessment and coordination of care, as well as examination of the patient.   SIGNED:  Noralee Stain, DO [facility] 02/09/2020, 12:24 PM

## 2020-02-12 LAB — CULTURE, BLOOD (ROUTINE X 2): Culture: NO GROWTH

## 2020-02-13 LAB — CULTURE, BLOOD (ROUTINE X 2)
Culture: NO GROWTH
Special Requests: ADEQUATE

## 2020-02-15 ENCOUNTER — Encounter: Payer: Self-pay | Admitting: Orthopedic Surgery

## 2020-02-15 ENCOUNTER — Non-Acute Institutional Stay (SKILLED_NURSING_FACILITY): Payer: Medicare PPO | Admitting: Orthopedic Surgery

## 2020-02-15 DIAGNOSIS — J449 Chronic obstructive pulmonary disease, unspecified: Secondary | ICD-10-CM

## 2020-02-15 DIAGNOSIS — R531 Weakness: Secondary | ICD-10-CM

## 2020-02-15 DIAGNOSIS — M79601 Pain in right arm: Secondary | ICD-10-CM

## 2020-02-15 DIAGNOSIS — E1149 Type 2 diabetes mellitus with other diabetic neurological complication: Secondary | ICD-10-CM

## 2020-02-15 DIAGNOSIS — J3089 Other allergic rhinitis: Secondary | ICD-10-CM | POA: Diagnosis not present

## 2020-02-15 DIAGNOSIS — M1189 Other specified crystal arthropathies, multiple sites: Secondary | ICD-10-CM | POA: Diagnosis not present

## 2020-02-15 DIAGNOSIS — R627 Adult failure to thrive: Secondary | ICD-10-CM | POA: Diagnosis not present

## 2020-02-15 DIAGNOSIS — I10 Essential (primary) hypertension: Secondary | ICD-10-CM

## 2020-02-15 LAB — BASIC METABOLIC PANEL
BUN: 21 (ref 4–21)
CO2: 24 — AB (ref 13–22)
Chloride: 99 (ref 99–108)
Creatinine: 0.7 (ref 0.5–1.1)
Glucose: 80
Potassium: 4.1 (ref 3.4–5.3)
Sodium: 135 — AB (ref 137–147)

## 2020-02-15 LAB — CBC AND DIFFERENTIAL
HCT: 34 — AB (ref 36–46)
Hemoglobin: 11.8 — AB (ref 12.0–16.0)
Platelets: 480 — AB (ref 150–399)
WBC: 7

## 2020-02-15 LAB — CBC: RBC: 3.99 (ref 3.87–5.11)

## 2020-02-15 LAB — COMPREHENSIVE METABOLIC PANEL: Calcium: 8.6 — AB (ref 8.7–10.7)

## 2020-02-15 NOTE — Progress Notes (Signed)
Location:    [facility] Room Number: 277/A Place of Service:  SNF [phone]) Provider:  Windell Moulding NP  Tisovec, Fransico Him, MD  Patient Care Team: Tisovec, Fransico Him, MD as PCP - General (Internal Medicine) Deneise Lever, MD (Pulmonary Disease)  Extended Emergency Contact Information Primary Emergency Contact: Janeann Forehand Address: [place], [place] Phone: [phone] Mobile Phone: [phone] Relation: Daughter  Code Status:  DNR Goals of care: Advanced Directive information Advanced Directives 02/15/2020  Does Patient Have a Medical Advance Directive? Yes  Type of Advance Directive Out of facility DNR (pink MOST or yellow form)  Does patient want to make changes to medical advance directive? No - Patient declined  Copy of [place] in Chart? -  Pre-existing out of facility DNR order (yellow form or pink MOST form) Yellow form placed in chart (order not valid for inpatient use)     Chief Complaint  Patient presents with  . Hospitalization Follow-up    Hospitalization Follow Up     HPI:  Pt is a 85 y.o. female seen today for a hospital f/u s/p admission from [facility] 12/21-12/23.   She has been a resident of [facility] since 12/23. PMH includes: hypertension, aortic valve disorder, allergic rhinitis, OSA, COPD, type 2 diabetes with neurological complications, arthritis of spine, polyarticular pseudogout, hyperlipidemia, generalized weakness, and failure to thrive.   Daughter and granddaughter present during encounter.   Prior to hospitalization she lived alone. Her daughter who lives out of town tried to call her and was unable to answer. Daughter was concerned and went to her mothers to check on her. She found her mother laying in bed, weak, running a fever, and unable to walk. She had been urinating on herself and did not eat or  drink 24 hours prior to hospitalization. She was taken to [facility] by EMS. In the ED she was found to be hypotensive, SBP 80's. Given multiple boluses of IV fluids. Afebrile but lactic acid elevated. Appeared severely dehydrated. Admitted with generalized weakness, dehydration and failure to thrive.   During her hospitalization, generalized weakness was found to be  related to dehydration. UA was unremarkable. Urine culture no growth. She was given Rocephin for reported fevers and mild leukocytosis. Her hypotension was treated with IV fluids, oral hydration and holding antihypertensives. Diabetes treated with sliding scale. Covid-19 negative. Influenza A and B negative. Chest x-ray no focal consolidation, pleural effusion or pneumothorax. EKG sinus rhythm. Skilled Facility placement was recommended for additional PT/OT and skilled nursing services.   Today, she is alert and orientated x 4. Follows commands and expresses needs. She can state what medication she took at home and provide good past health history. Confused with medications she is receiving here. She states she does not feel well today. Complains of right wrist pain that began 12/23. Pain more with movement. Tries to keep arm still and elevated on pillow. Pain has radiated up her arm in the last two days with her right shoulder now being painful. Does not think tylenol or gabapentin helping with pain. Refused PT this morning due to arm pain.  Besides arm pain, she complains of nasal congestion. Wondering if she is being given Allegra for allergies. Denies sore throat, cough, body aches, fever, or headaches.   Upset her appetite remains poor. Only eating one  meal a day. Cannot remember if she is being given ensure. Would like ensure twice daily for energy.   No recent falls or injuries.   Covid-19 rapid test done 12/27- negative.   Facility nurse does not reports any concerns, vitals stable.     Past Medical History:   Diagnosis Date  . Allergic rhinitis   . Arthritis   . Complication of anesthesia    Hard time waking up  . COPD (chronic obstructive pulmonary disease) ([place])    PFT 08/08/09-FEV1 1.98/107; R 0.77; small airway obst w/tresp to dilator;DLCO 44%  . Diabetes mellitus   . Fibromuscular dysplasia (H[facility])    RAS  . Hyperlipidemia   . Hypertension   . PONV (postoperative nausea and vomiting)   . Pseudogout   . Sleep apnea    AHI 89/hr   Past Surgical History:  Procedure Laterality Date  . BACK SURGERY  2005  . bilateral renal bypass    . ESOPHAGOGASTRODUODENOSCOPY N/A 10/01/2012   Procedure: ESOPHAGOGASTRODUODENOSCOPY (EGD);  Surgeon: Missy Sabins, MD;  Location: [facility] ENDOSCOPY;  Service: Endoscopy;  Laterality: N/A;  . nasal septal deviation    . pituitary tumor removed     x 2  . TONSILLECTOMY      Allergies  Allergen Reactions  . Valacyclovir Hcl Rash    Allergies as of 02/15/2020      Reactions   Valacyclovir Hcl Rash      Medication List       Accurate as of February 15, 2020 11:17 AM. If you have any questions, ask your nurse or doctor.        acetaminophen 325 MG tablet Commonly known as: TYLENOL Take 650 mg by mouth every 6 (six) hours as needed for headache. Geralyn Corwin MD if not relived 9 not to exceed 3036m in 24 hour period   amLODipine 10 MG tablet Commonly known as: NORVASC Take 10 mg by mouth daily.   atorvastatin 80 MG tablet Commonly known as: LIPITOR Take 80 mg by mouth daily.   Ensure Take 237 mLs by mouth daily. (prefers chocolate) d/t poor appetite.   fexofenadine 180 MG tablet Commonly known as: ALLEGRA Take 180 mg by mouth daily.   gabapentin 600 MG tablet Commonly known as: NEURONTIN Take 600 mg by mouth at bedtime.   losartan 50 MG tablet Commonly known as: COZAAR Take 50 mg by mouth daily.   polyvinyl alcohol 1.4 % ophthalmic solution Commonly known as: LIQUIFILM TEARS Place 1 drop into both eyes as needed for dry eyes.        Review of Systems  Constitutional: Positive for activity change, appetite change and fatigue. Negative for fever.  HENT: Positive for congestion. Negative for dental problem, hearing loss, sore throat and trouble swallowing.   Eyes: Negative for visual disturbance.  Respiratory: Negative for cough, shortness of breath and wheezing.   Cardiovascular: Negative for chest pain and leg swelling.  Gastrointestinal: Negative for abdominal pain, constipation, diarrhea and nausea.  Endocrine: Negative for polydipsia, polyphagia and polyuria.  Genitourinary: Negative for dysuria and hematuria.  Musculoskeletal: Positive for arthralgias and myalgias.       Right arm pain  Skin:       Dry skin  Neurological: Positive for weakness. Negative for dizziness and headaches.  Psychiatric/Behavioral: Negative for dysphoric mood and sleep disturbance. The patient is not nervous/anxious.     Immunization History  Administered Date(s) Administered  . Influenza Split 10/19/2010  . PPD Test 03/18/2016   Pertinent  Health Maintenance Due  Topic Date Due  . FOOT EXAM  Never done  . OPHTHALMOLOGY EXAM  Never done  . DEXA SCAN  Never done  . PNA vac Low Risk Adult (1 of 2 - PCV13) Never done  . INFLUENZA VACCINE  09/18/2019  . HEMOGLOBIN A1C  08/07/2020   No flowsheet data found. Functional Status Survey:    Vitals:   02/15/20 1116  BP: 127/69  Pulse: 76  Resp: 18  Temp: 97.7 F (36.5 C)  Weight: 147 lb (66.7 kg)  Height: _0  (1.6 m)   Body mass index is 26.04 kg/m. Physical Exam Vitals reviewed.  Constitutional:      General: She is not in acute distress.    Appearance: Normal appearance.  HENT:     Head: Normocephalic.     Right Ear: There is no impacted cerumen.     Left Ear: There is no impacted cerumen.     Nose: Nose normal.     Mouth/Throat:     Mouth: Mucous membranes are moist.     Pharynx: No posterior oropharyngeal erythema.  Eyes:     General:        Right eye: No  discharge.        Left eye: No discharge.  Cardiovascular:     Rate and Rhythm: Normal rate and regular rhythm.     Pulses: Normal pulses.     Heart sounds: Murmur heard.    Pulmonary:     Effort: Pulmonary effort is normal. No respiratory distress.     Breath sounds: Normal breath sounds. No wheezing.  Abdominal:     General: Bowel sounds are normal. There is no distension.     Palpations: Abdomen is soft.     Tenderness: There is no abdominal tenderness.  Musculoskeletal:     Cervical back: Normal range of motion.     Right lower leg: No edema.     Left lower leg: No edema.     Comments: Right wrist swollen, tender to touch, limited ROM. Right upper extremity does not appear swollen. Skin intact, no sign of injury.   Lymphadenopathy:     Cervical: No cervical adenopathy.  Skin:    General: Skin is warm and dry.     Capillary Refill: Capillary refill takes less than 2 seconds.  Neurological:     General: No focal deficit present.     Mental Status: She is alert and oriented to person, place, and time.     Motor: Weakness present.     Gait: Gait abnormal.  Psychiatric:        Mood and Affect: Mood normal.        Behavior: Behavior normal.        Thought Content: Thought content normal.        Judgment: Judgment normal.     Labs reviewed: Recent Labs    02/07/20 1707 02/08/20 0338 02/09/20 0323  NA 141 137 138  K 3.5 3.0* 3.6  CL 103 105 108  CO2 19* 22 19*  GLUCOSE 84 102* 98  BUN 28* 24* 21  CREATININE 0.88 0.81 0.86  CALCIUM 8.3* 7.7* 7.8*  MG  --   --  1.5*   Recent Labs    02/07/20 1707 02/08/20 0338  AST 26 20  ALT 14 14  ALKPHOS 89 76  BILITOT 1.4* 1.0  PROT 7.7 6.3*  ALBUMIN 4.0 3.3*   Recent Labs    02/07/20 1707 02/08/20 0338 02/09/20  0323  WBC 11.7* 10.6* 9.4  NEUTROABS 9.2*  --   --   HGB 14.4 11.9* 11.4*  HCT 45.0 37.3 35.6*  MCV 93.8 94.2 92.7  PLT 323 275 316   Lab Results  Component Value Date   TSH 0.15 (L) 08/14/2011    Lab Results  Component Value Date   HGBA1C 5.3 02/07/2020   No results found for: CHOL, HDL, LDLCALC, LDLDIRECT, TRIG, CHOLHDL  Significant Diagnostic Results in last 30 days:  DG Chest Port 1 View  Result Date: 02/07/2020 CLINICAL DATA:  85 year old female with fever. EXAM: PORTABLE CHEST 1 VIEW COMPARISON:  Chest radiograph dated 09/30/2012 and CT dated 03/16/2016. FINDINGS: No focal consolidation, pleural effusion, pneumothorax. The cardiac silhouette is within limits. Atherosclerotic calcification of the aorta. Degenerative changes of the spine. Lower thoracic vertebroplasty. No acute osseous pathology. IMPRESSION: No active cardiopulmonary disease. Electronically Signed   By: Anner Crete M.D.   On: 02/07/2020 16:49    Assessment/Plan 1. Right arm pain - abrupt onset at wrist with radiation since admission, history of pseudogout - swelling only at wrist, tender to touch, limited ROM - cbc/diff r/o infection - esr and crp- r/o PMR - increase tylenol to 1000 mg PO BID for pain  2. General weakness - ongoing, continue PT/OT  3. Essential hypertension - bp at goal < 150/90 - continue amlodipine and norvasc - bmp  4. Type 2 diabetes mellitus with neurological complications ([place]) - stable without medication, last A1C 5.3 on 12/21  5. Chronic obstructive pulmonary disease, unspecified COPD type ([place]) - stable without oxygen  6. FTT (failure to thrive) in adult - ongoing, she is only eating one meal daily - will increase ensure to BID between meals - continue snf, PT/OT  7. Polyarticular pseudogout - she has a history of this in past - hepatic function normal - will start colchicine 1.2 mg once, 0.6 mg 1 hour after  8. Non-seasonal allergic rhinitis, unspecified trigger - nasal turbines normal, no visual drainage - continue allegra - covid-19 test 12/27- negative - rapid flu     Family/ staff Communication: Plan discussed with patient and facility  nurse  Labs/tests ordered:  Cbc/diff, bmp, esr,crp,tsh

## 2020-02-16 LAB — TSH: TSH: 0.35 — AB (ref 0.41–5.90)

## 2020-02-21 ENCOUNTER — Non-Acute Institutional Stay (SKILLED_NURSING_FACILITY): Payer: Medicare PPO | Admitting: Internal Medicine

## 2020-02-21 ENCOUNTER — Encounter: Payer: Self-pay | Admitting: Internal Medicine

## 2020-02-21 DIAGNOSIS — I10 Essential (primary) hypertension: Secondary | ICD-10-CM

## 2020-02-21 DIAGNOSIS — E1149 Type 2 diabetes mellitus with other diabetic neurological complication: Secondary | ICD-10-CM | POA: Diagnosis not present

## 2020-02-21 DIAGNOSIS — J449 Chronic obstructive pulmonary disease, unspecified: Secondary | ICD-10-CM

## 2020-02-21 DIAGNOSIS — L24A2 Irritant contact dermatitis due to fecal, urinary or dual incontinence: Secondary | ICD-10-CM | POA: Diagnosis not present

## 2020-02-21 DIAGNOSIS — M1189 Other specified crystal arthropathies, multiple sites: Secondary | ICD-10-CM | POA: Diagnosis not present

## 2020-02-21 DIAGNOSIS — M79601 Pain in right arm: Secondary | ICD-10-CM

## 2020-02-21 DIAGNOSIS — E785 Hyperlipidemia, unspecified: Secondary | ICD-10-CM

## 2020-02-21 DIAGNOSIS — Z66 Do not resuscitate: Secondary | ICD-10-CM

## 2020-02-21 DIAGNOSIS — R531 Weakness: Secondary | ICD-10-CM

## 2020-02-21 DIAGNOSIS — R627 Adult failure to thrive: Secondary | ICD-10-CM | POA: Diagnosis not present

## 2020-02-21 DIAGNOSIS — R7989 Other specified abnormal findings of blood chemistry: Secondary | ICD-10-CM

## 2020-02-21 NOTE — Progress Notes (Signed)
Provider:  Rexene Edison. Mariea Clonts, D.O., C.M.D. Location:  [place] of Service:   SNF  PCP: Tisovec, Fransico Him, MD Patient Care Team: Tisovec, Fransico Him, MD as PCP - General (Internal Medicine) Deneise Lever, MD (Pulmonary Disease)  Extended Emergency Contact Information Primary Emergency Contact: Janeann Forehand Address: [place], [place] Phone: [phone] Mobile Phone: [phone] Relation: Daughter  Code Status: DNR Goals of Care: Advanced Directive information Advanced Directives 02/15/2020  Does Patient Have a Medical Advance Directive? Yes  Type of Advance Directive Out of facility DNR (pink MOST or yellow form)  Does patient want to make changes to medical advance directive? No - Patient declined  Copy of [place] in Chart? -  Pre-existing out of facility DNR order (yellow form or pink MOST form) Yellow form placed in chart (order not valid for inpatient use)   Chief Complaint  Patient presents with  . New Admit To SNF    New admission to [facility] SNF     HPI: Patient is a 86 y.o. female seen today for admission to [facility] status post hospitalization from December 21 to December 23 at [facility] with weakness and dehydration.  Ms. Whittenberg has a past medical history significant for chronic obstructive pulmonary disease, type 2 diabetes, hypertension, hyperlipidemia, osteoarthritis, and sleep apnea.  Her daughter had found her quite weak and unable to move at home where she lives alone.  Her daughter lives in [place] and found out that patient was unable to answer the phone to call 911.  She was noted to be weak and debilitated and found to have a temperature up to 103 in the past few days.  Her weakness has been going on for 3 to 4 days in the last 2 days she had not ambulated.  She had had some urinary incontinence.  She had not been eating or drinking  for 24 hours.  Her daughter had to climb in the back window to get to her mother.  Emergency room she was found to be hypotensive with a systolic blood pressure in the 80s.  She received multiple fluid boluses.  She was afebrile in the emergency room but had elevated lactic acid and appeared dehydrated.  She was awake and alert.  Urinalysis showed trace leukocytes 6-10 and rare bacteria.  Chest x-ray showed no active pulmonary disease and her Covid and flu tests were negative.  Her white blood cell count was 11.7.  INR was 3.1 on 1221. PT and OT were consulted to evaluate her functional status and mobility.  Her antihypertensives were held and she was aggressively hydrated.  She was treated with a sliding scale for her diabetes.  Given her history of subjective fever and mild leukocytosis she was given empiric Rocephin pending urine and blood cultures.  Skilled nursing facility placement was recommended.  She was rehydrated and her blood pressure had improved to 145/74 so her Cozaar and Norvasc were resumed.  Her hemoglobin A1c was 5.3.  He was continued on Lipitor for her cholesterol.  Her magnesium was low and was replaced.  Her tramadol was discontinued during her hospitalization.  She has had her COVID vaccines on March 13 and April 10 (moderna), 2021.  Here she had some follow-up lab work on December 29 and December 30.  Her TSH was noted to be 0.35.  It appears that this was much lower in the past.  Per the [facility] records in epic, she has not had a history of hyperthyroidism or treatment for her thyroid.  She sees Dr. Osborne Casco whose records are not in the system.  She has had a history of a pituitary tumor removal x2.    NP saw her acutely and in hospital f/u for right arm pain that began in her wrist and she was really not moving it much and resting it on a pillow 12/29.  She was suspicious for pseudogout and ESR, CRP done and ESR was 70 and CRP nonreactive.  It looks like she got 2 colchicine tablets  only.  She is on tylenol for pain and gabapentin at hs.   When seen, she expressed sadness that she will be going to her daughter's home after this b/c her two old cats and one younger one cannot come along with her.  She thinks she may not see them again which is devastating to her.    She is getting physically stronger, eating better and making progress.  She c/o irritation of her buttocks from when she was not able to get up and use the restroom on her own and had to use adult undergarments.   Past Medical History:  Diagnosis Date  . Allergic rhinitis   . Arthritis   . Complication of anesthesia    Hard time waking up  . COPD (chronic obstructive pulmonary disease) ([place])    PFT 08/08/09-FEV1 1.98/107; R 0.77; small airway obst w/tresp to dilator;DLCO 44%  . Diabetes mellitus   . Fibromuscular dysplasia (HCC)    RAS  . Hyperlipidemia   . Hypertension   . PONV (postoperative nausea and vomiting)   . Pseudogout   . Sleep apnea    AHI 89/hr   Past Surgical History:  Procedure Laterality Date  . BACK SURGERY  2005  . bilateral renal bypass    . ESOPHAGOGASTRODUODENOSCOPY N/A 10/01/2012   Procedure: ESOPHAGOGASTRODUODENOSCOPY (EGD);  Surgeon: Missy Sabins, MD;  Location: [facility] ENDOSCOPY;  Service: Endoscopy;  Laterality: N/A;  . nasal septal deviation    . pituitary tumor removed     x 2  . TONSILLECTOMY      Social History   Socioeconomic History  . Marital status: Widowed    Spouse name: Not on file  . Number of children: Not on file  . Years of education: Not on file  . Highest education level: Not on file  Occupational History  . Occupation: retired Animator  Tobacco Use  . Smoking status: Former Smoker    Packs/day: 1.00    Years: 35.00    Pack years: 35.00    Types: Cigarettes    Quit date: 02/17/1997    Years since quitting: 23.0  . Smokeless tobacco: Never Used  Substance and Sexual Activity  . Alcohol use: No  . Drug use: No  .  Sexual activity: Not on file  Other Topics Concern  . Not on file  Social History Narrative  . Not on file   Social Determinants of Health   Financial Resource Strain: Not on file  Food Insecurity: Not on file  Transportation Needs: Not on file  Physical Activity: Not on file  Stress: Not on file  Social Connections: Not on file    reports that she quit smoking about 23 years ago. Her smoking use included cigarettes. She has a 35.00 pack-year smoking history. She has never used smokeless  tobacco. She reports that she does not drink alcohol and does not use drugs.  Functional Status Survey:    Family History  Problem Relation Age of Onset  . Stroke Father   . Breast cancer Mother     Health Maintenance  Topic Date Due  . FOOT EXAM  Never done  . OPHTHALMOLOGY EXAM  Never done  . TETANUS/TDAP  Never done  . DEXA SCAN  Never done  . PNA vac Low Risk Adult (1 of 2 - PCV13) Never done  . HEMOGLOBIN A1C  08/07/2020  . INFLUENZA VACCINE  Completed  . COVID-19 Vaccine  Completed    Allergies  Allergen Reactions  . Valacyclovir Hcl Rash    Outpatient Encounter Medications as of 02/21/2020  Medication Sig  . acetaminophen (TYLENOL) 325 MG tablet Take 650 mg by mouth every 6 (six) hours as needed for headache. Geralyn Corwin MD if not relived 9 not to exceed 3011m in 24 hour period  . acetaminophen (TYLENOL) 500 MG tablet Take 1,000 mg by mouth 2 (two) times daily as needed.  .Marland KitchenamLODipine (NORVASC) 10 MG tablet Take 10 mg by mouth daily.  .Marland Kitchenatorvastatin (LIPITOR) 80 MG tablet Take 80 mg by mouth daily.  . Ensure (ENSURE) Take 237 mLs by mouth daily. (prefers chocolate) d/t poor appetite.  . fexofenadine (ALLEGRA) 180 MG tablet Take 180 mg by mouth daily.  .Marland Kitchengabapentin (NEURONTIN) 600 MG tablet Take 600 mg by mouth at bedtime.  .Marland Kitchenlosartan (COZAAR) 50 MG tablet Take 50 mg by mouth daily.  . polyvinyl alcohol (LIQUIFILM TEARS) 1.4 % ophthalmic solution Place 1 drop into both eyes as  needed for dry eyes.   No facility-administered encounter medications on file as of 02/21/2020.    Review of Systems  Constitutional: Positive for malaise/fatigue. Negative for chills and fever.  HENT: Negative for congestion and sore throat.   Eyes: Negative for blurred vision.  Respiratory: Negative for cough and shortness of breath.   Cardiovascular: Negative for chest pain, palpitations and leg swelling.  Gastrointestinal: Negative for abdominal pain and constipation.  Genitourinary: Negative for dysuria.  Musculoskeletal: Negative for falls.       Wrist pain resolved  Skin: Positive for rash.       Discomfort buttocks  Neurological: Positive for weakness. Negative for dizziness and loss of consciousness.       Weakness improving  Endo/Heme/Allergies: Bruises/bleeds easily.  Psychiatric/Behavioral: Positive for depression. Negative for memory loss. The patient is not nervous/anxious and does not have insomnia.     Vitals:   02/21/20 0928  BP: 124/78  Pulse: 70  Temp: 97.6 F (36.4 C)  Weight: 147 lb (66.7 kg)  Height: _0  (1.6 m)   Body mass index is 26.04 kg/m. Physical Exam Vitals reviewed.  Constitutional:      General: She is not in acute distress.    Appearance: Normal appearance. She is not toxic-appearing.  HENT:     Head: Normocephalic and atraumatic.     Right Ear: External ear normal.     Left Ear: External ear normal.     Nose: Nose normal.     Mouth/Throat:     Pharynx: Oropharynx is clear.  Eyes:     Extraocular Movements: Extraocular movements intact.     Conjunctiva/sclera: Conjunctivae normal.     Pupils: Pupils are equal, round, and reactive to light.  Cardiovascular:     Rate and Rhythm: Normal rate and regular rhythm.     Heart sounds:  Murmur heard.    Pulmonary:     Effort: Pulmonary effort is normal.     Breath sounds: Normal breath sounds. No wheezing, rhonchi or rales.  Abdominal:     General: Bowel sounds are normal.      Palpations: Abdomen is soft.     Tenderness: There is no abdominal tenderness.  Musculoskeletal:        General: Normal range of motion.     Cervical back: Neck supple.     Right lower leg: No edema.     Left lower leg: No edema.     Comments: No residual tenderness, swelling, erythema or ROM challenges with right wrist  Skin:    General: Skin is warm and dry.     Capillary Refill: Capillary refill takes less than 2 seconds.     Comments: Erythema and scaly skin along gluteal crease  Neurological:     General: No focal deficit present.     Mental Status: She is alert and oriented to person, place, and time.     Motor: No weakness.     Gait: Gait normal.  Psychiatric:     Comments: Flat affect     Labs reviewed: Basic Metabolic Panel: Recent Labs    02/07/20 1707 02/08/20 0338 02/09/20 0323 02/15/20 0000  NA 141 137 138 135*  K 3.5 3.0* 3.6 4.1  CL 103 105 108 99  CO2 19* 22 19* 24*  GLUCOSE 84 102* 98  --   BUN 28* 24* 21 21  CREATININE 0.88 0.81 0.86 0.7  CALCIUM 8.3* 7.7* 7.8* 8.6*  MG  --   --  1.5*  --    Liver Function Tests: Recent Labs    02/07/20 1707 02/08/20 0338  AST 26 20  ALT 14 14  ALKPHOS 89 76  BILITOT 1.4* 1.0  PROT 7.7 6.3*  ALBUMIN 4.0 3.3*   No results for input(s): LIPASE, AMYLASE in the last 8760 hours. No results for input(s): AMMONIA in the last 8760 hours. CBC: Recent Labs    02/07/20 1707 02/08/20 0338 02/09/20 0323 02/15/20 0000  WBC 11.7* 10.6* 9.4 7.0  NEUTROABS 9.2*  --   --   --   HGB 14.4 11.9* 11.4* 11.8*  HCT 45.0 37.3 35.6* 34*  MCV 93.8 94.2 92.7  --   PLT 323 275 316 480*   Cardiac Enzymes: Recent Labs    02/07/20 1707  CKTOTAL 84   BNP: Invalid input(s): POCBNP Lab Results  Component Value Date   HGBA1C 5.3 02/07/2020   Lab Results  Component Value Date   TSH 0.35 (A) 02/16/2020   Lab Results  Component Value Date   VITAMINB12 271 01/12/2008   No results found for: FOLATE Lab Results   Component Value Date   IRON 15 (L) 01/12/2008   TIBC 338 01/12/2008    Imaging and Procedures obtained prior to SNF admission: DG Chest Port 1 View  Result Date: 02/07/2020 CLINICAL DATA:  85 year old female with fever. EXAM: PORTABLE CHEST 1 VIEW COMPARISON:  Chest radiograph dated 09/30/2012 and CT dated 03/16/2016. FINDINGS: No focal consolidation, pleural effusion, pneumothorax. The cardiac silhouette is within limits. Atherosclerotic calcification of the aorta. Degenerative changes of the spine. Lower thoracic vertebroplasty. No acute osseous pathology. IMPRESSION: No active cardiopulmonary disease. Electronically Signed   By: Anner Crete M.D.   On: 02/07/2020 16:49    Assessment/Plan 1. Right arm pain -resolve with just two doses of colchicine, it appears  2. Polyarticular pseudogout -felt  to be etiology of above  3. General weakness -improving with therapy  4. FTT (failure to thrive) in adult -remains unhappy with loss of independence and possibly her cats -on supplement shakes and eating better than when she arrived but appetite less  5. Type 2 diabetes mellitus with neurological complications (HCC) -great control, and now intake down -not on meds for DMII except for statin and gabapentin Lab Results  Component Value Date   HGBA1C 5.3 02/07/2020  -quality metrics per PCP  6. Chronic obstructive pulmonary disease, unspecified COPD type ([place]) -not on treatment and not c/o symptoms  7. Hyperlipidemia, unspecified hyperlipidemia type -continues on lipitor high dose for this  8. Essential hypertension -bp well-controlled  9. Irritant contact dermatitis due to urinary incontinence Barrier cream for contact derm in gluteal crease  10. DNR (do not resuscitate) - Do not attempt resuscitation (DNR)  11.  Abnormal TSH:  Free t4 and free T3, requested record from Dr. Osborne Casco due to pituitary surgery (? Secondary thyroid)  Is sad that she may not go home and  worried about what will happen to her cats   Family/ staff Communication: d/w snf nurse   Camila Norville L. Dalexa Gentz, D.O. [facility] [address] [place],  [address] Cell Phone (Mon-Fri 8am-5pm):  [phone] On Call:  [phone] & follow prompts after 5pm & weekends Office Phone:  [phone] Office Fax:  [phone]

## 2020-02-23 ENCOUNTER — Non-Acute Institutional Stay (SKILLED_NURSING_FACILITY): Payer: Medicare PPO | Admitting: Orthopedic Surgery

## 2020-02-23 ENCOUNTER — Encounter: Payer: Self-pay | Admitting: Orthopedic Surgery

## 2020-02-23 DIAGNOSIS — J449 Chronic obstructive pulmonary disease, unspecified: Secondary | ICD-10-CM | POA: Diagnosis not present

## 2020-02-23 DIAGNOSIS — E1149 Type 2 diabetes mellitus with other diabetic neurological complication: Secondary | ICD-10-CM | POA: Diagnosis not present

## 2020-02-23 DIAGNOSIS — E785 Hyperlipidemia, unspecified: Secondary | ICD-10-CM | POA: Diagnosis not present

## 2020-02-23 DIAGNOSIS — M79601 Pain in right arm: Secondary | ICD-10-CM

## 2020-02-23 DIAGNOSIS — R627 Adult failure to thrive: Secondary | ICD-10-CM | POA: Diagnosis not present

## 2020-02-23 DIAGNOSIS — R531 Weakness: Secondary | ICD-10-CM | POA: Diagnosis not present

## 2020-02-23 DIAGNOSIS — I10 Essential (primary) hypertension: Secondary | ICD-10-CM

## 2020-02-23 NOTE — Progress Notes (Signed)
Location:    [place].   Nursing Home Room Number: 106-P Place of Service:  SNF (31)  Provider: Windell Moulding, AGNP-C  PCP: [name], MD Patient Care Team: [name], MD as PCP - General (Internal Medicine) Deneise Lever, MD (Pulmonary Disease)  Extended Emergency Contact Information Primary Emergency Contact: Janeann Forehand Address: [address], [place] Phone: [phone] Mobile Phone: [phone] Relation: Daughter  Code Status: DNR Goals of care:  Advanced Directive information Advanced Directives 02/23/2020  Does Patient Have a Medical Advance Directive? Yes  Type of Advance Directive Out of facility DNR (pink MOST or yellow form)  Does patient want to make changes to medical advance directive? No - Patient declined  Copy of [place] in Chart? -  Pre-existing out of facility DNR order (yellow form or pink MOST form) -     Allergies  Allergen Reactions  . Valacyclovir Hcl Rash    Chief Complaint  Patient presents with  . Discharge Note    Discharge from SNF    HPI:  86 y.o. female seen today for discharge evaluation.   She has been a resident of [facility] since 12/23. PMH includes: hypertension, COPD, OSA, type 2 diabetes with neurological complications, pseudogout, hyperlipidemia and general weakness.   Prior to her stay, she lived at home alone. 12/20 her daughter could not reach her on the phone. Daughter drove from [place] to check on mother. She was found weak in bed with a fever of 103. 12/21 daughter called EMS and she was taken to [facility]. ED found her to be hypotensive with systolic in [id number] and extremely dehydrated. She was admitted for generalized weakness, dehydration and failure to thrive.  Covid-19 negative. CXR negative for active pulmonary disease. UA with trace leukocytes. During her hospital stay she was  given IV fluids boluses for dehydration and low bp.Her bp medication was held until bp recovered. Magnesium was replaced. It was recommended she be discharged to snf for PT/OT and skilled nursing services.   Today, she is laying in bed with daughter present. She is alert and oriented x 4. Follows commands and can express needs. Ambulating about 125 ft with front wheeled walker, minimal assistance. Appetite remains poor. She has been receiving ensure supplemental shakes. Daughter has brought some snacks. Earlier today, facility nurse reported she was having diarrhea. Believed to be related to something she ate. She was given ginger ale and one dose of imodium. Daughter expressed concerns if this was related to covid-19. She was routinely tested yesterday and was negative. She already states she is feeling better with nurse interventions, no other incidents of diarrhea reported. Her right arm pain has improved since last seen. She denies tenderness and limited ROM.   Recent blood pressures are as follows:   01/06- 122/71  01/05- 131/79  01/04- 110/76  Weight has remained unchanged at 147 lbs during stay.   No recent falls or injuries reported.   Facility nurse does not report other concerns at this time, vitals stable.   At this time, daughter plans to help with her care after [facility]. Home health PT/Ot have been ordered. No DME needs expressed at this time. I have advised them to follow up with PCP in 1-2 weeks for follow up and lab work.    Past Medical History:  Diagnosis Date  .  Allergic rhinitis   . Arthritis   . Complication of anesthesia    Hard time waking up  . COPD (chronic obstructive pulmonary disease) (HCC)    PFT 08/08/09-FEV1 1.98/107; R 0.77; small airway obst w/tresp to dilator;DLCO 44%  . Diabetes mellitus   . Fibromuscular dysplasia (HCC)    RAS  . Hyperlipidemia   . Hypertension   . PONV (postoperative nausea and vomiting)   . Pseudogout   . Sleep apnea    AHI  89/hr    Past Surgical History:  Procedure Laterality Date  . BACK SURGERY  2005  . bilateral renal bypass    . ESOPHAGOGASTRODUODENOSCOPY N/A 10/01/2012   Procedure: ESOPHAGOGASTRODUODENOSCOPY (EGD);  Surgeon: Barrie Folk, MD;  Location: [facility] ENDOSCOPY;  Service: Endoscopy;  Laterality: N/A;  . nasal septal deviation    . pituitary tumor removed     x 2  . TONSILLECTOMY        reports that she quit smoking about 23 years ago. Her smoking use included cigarettes. She has a 35.00 pack-year smoking history. She has never used smokeless tobacco. She reports that she does not drink alcohol and does not use drugs. Social History   Socioeconomic History  . Marital status: Widowed    Spouse name: Not on file  . Number of children: Not on file  . Years of education: Not on file  . Highest education level: Not on file  Occupational History  . Occupation: retired Web designer  Tobacco Use  . Smoking status: Former Smoker    Packs/day: 1.00    Years: 35.00    Pack years: 35.00    Types: Cigarettes    Quit date: 02/17/1997    Years since quitting: 23.0  . Smokeless tobacco: Never Used  Substance and Sexual Activity  . Alcohol use: No  . Drug use: No  . Sexual activity: Not on file  Other Topics Concern  . Not on file  Social History Narrative  . Not on file   Social Determinants of Health   Financial Resource Strain: Not on file  Food Insecurity: Not on file  Transportation Needs: Not on file  Physical Activity: Not on file  Stress: Not on file  Social Connections: Not on file  Intimate Partner Violence: Not on file   Functional Status Survey:    Allergies  Allergen Reactions  . Valacyclovir Hcl Rash    Pertinent  Health Maintenance Due  Topic Date Due  . FOOT EXAM  Never done  . OPHTHALMOLOGY EXAM  Never done  . DEXA SCAN  Never done  . PNA vac Low Risk Adult (1 of 2 - PCV13) Never done  . HEMOGLOBIN A1C  08/07/2020  . INFLUENZA VACCINE   Completed    Medications: Allergies as of 02/23/2020      Reactions   Valacyclovir Hcl Rash      Medication List       Accurate as of February 23, 2020  3:21 PM. If you have any questions, ask your nurse or doctor.        acetaminophen 500 MG tablet Commonly known as: TYLENOL Take 1,000 mg by mouth 2 (two) times daily as needed.   acetaminophen 325 MG tablet Commonly known as: TYLENOL Take 650 mg by mouth every 6 (six) hours as needed for headache. Gavin Potters MD if not relived 9 not to exceed 3000mg  in 24 hour period   amLODipine 10 MG tablet Commonly known as: NORVASC Take 10 mg by  mouth daily.   atorvastatin 80 MG tablet Commonly known as: LIPITOR Take 80 mg by mouth daily.   bisacodyl 10 MG suppository Commonly known as: DULCOLAX Place 10 mg rectally as needed for moderate constipation.   Ensure Take 237 mLs by mouth daily. (prefers chocolate) d/t poor appetite.   fexofenadine 180 MG tablet Commonly known as: ALLEGRA Take 180 mg by mouth daily.   gabapentin 600 MG tablet Commonly known as: NEURONTIN Take 600 mg by mouth at bedtime.   losartan 50 MG tablet Commonly known as: COZAAR Take 50 mg by mouth daily.   magnesium hydroxide 400 MG/5ML suspension Commonly known as: MILK OF MAGNESIA Take by mouth daily as needed for mild constipation.   polyvinyl alcohol 1.4 % ophthalmic solution Commonly known as: LIQUIFILM TEARS Place 1 drop into both eyes as needed for dry eyes.   RA SALINE ENEMA RE Place rectally as needed.       Review of Systems  Constitutional: Positive for fatigue. Negative for activity change, appetite change and fever.  HENT: Negative for congestion, dental problem, sore throat and trouble swallowing.   Eyes: Negative for photophobia.  Respiratory: Negative for cough, shortness of breath and wheezing.   Cardiovascular: Negative for chest pain and leg swelling.  Gastrointestinal: Positive for diarrhea. Negative for abdominal pain,  constipation and nausea.  Genitourinary: Negative for dysuria, frequency and hematuria.  Musculoskeletal: Positive for arthralgias and myalgias.       Right upper arm pain  Skin:       Dry skin  Neurological: Positive for weakness and numbness. Negative for dizziness and headaches.  Psychiatric/Behavioral: Negative for confusion and dysphoric mood. The patient is not nervous/anxious.     Vitals:   02/23/20 1512  BP: 122/71  Pulse: 70  Resp: 18  Temp: (!) 97 F (36.1 C)  Weight: 147 lb (66.7 kg)  Height: 5\' 3"  (1.6 m)   Body mass index is 26.04 kg/m. Physical Exam Constitutional:      General: She is not in acute distress. HENT:     Head: Normocephalic.     Right Ear: There is no impacted cerumen.     Left Ear: There is no impacted cerumen.     Nose: Nose normal.     Mouth/Throat:     Mouth: Mucous membranes are moist.  Eyes:     Pupils: Pupils are equal, round, and reactive to light.  Cardiovascular:     Rate and Rhythm: Normal rate and regular rhythm.     Pulses: Normal pulses.     Heart sounds: Murmur heard.    Pulmonary:     Effort: Pulmonary effort is normal. No respiratory distress.     Breath sounds: Normal breath sounds. No wheezing.  Abdominal:     General: Abdomen is flat. Bowel sounds are normal. There is no distension.     Palpations: Abdomen is soft.     Tenderness: There is no abdominal tenderness.  Musculoskeletal:     Right upper arm: Tenderness present.     Cervical back: Normal range of motion.     Right lower leg: No edema.     Left lower leg: No edema.  Lymphadenopathy:     Cervical: No cervical adenopathy.  Skin:    General: Skin is warm and dry.     Capillary Refill: Capillary refill takes less than 2 seconds.  Neurological:     General: No focal deficit present.     Mental Status: She is alert and  oriented to person, place, and time. Mental status is at baseline.     Motor: Weakness present.     Gait: Gait abnormal.     Comments:  Front wheeled walker  Psychiatric:        Mood and Affect: Mood normal.        Behavior: Behavior normal.     Labs reviewed: Basic Metabolic Panel: Recent Labs    02/07/20 1707 02/08/20 0338 02/09/20 0323 02/15/20 0000  NA 141 137 138 135*  K 3.5 3.0* 3.6 4.1  CL 103 105 108 99  CO2 19* 22 19* 24*  GLUCOSE 84 102* 98  --   BUN 28* 24* 21 21  CREATININE 0.88 0.81 0.86 0.7  CALCIUM 8.3* 7.7* 7.8* 8.6*  MG  --   --  1.5*  --    Liver Function Tests: Recent Labs    02/07/20 1707 02/08/20 0338  AST 26 20  ALT 14 14  ALKPHOS 89 76  BILITOT 1.4* 1.0  PROT 7.7 6.3*  ALBUMIN 4.0 3.3*   No results for input(s): LIPASE, AMYLASE in the last 8760 hours. No results for input(s): AMMONIA in the last 8760 hours. CBC: Recent Labs    02/07/20 1707 02/08/20 0338 02/09/20 0323 02/15/20 0000  WBC 11.7* 10.6* 9.4 7.0  NEUTROABS 9.2*  --   --   --   HGB 14.4 11.9* 11.4* 11.8*  HCT 45.0 37.3 35.6* 34*  MCV 93.8 94.2 92.7  --   PLT 323 275 316 480*   Cardiac Enzymes: Recent Labs    02/07/20 1707  CKTOTAL 84   BNP: Invalid input(s): POCBNP CBG: Recent Labs    02/09/20 0715 02/09/20 1217 02/09/20 1652  GLUCAP 85 105* 84    Procedures and Imaging Studies During Stay: DG Chest Port 1 View  Result Date: 02/07/2020 CLINICAL DATA:  86 year old female with fever. EXAM: PORTABLE CHEST 1 VIEW COMPARISON:  Chest radiograph dated 09/30/2012 and CT dated 03/16/2016. FINDINGS: No focal consolidation, pleural effusion, pneumothorax. The cardiac silhouette is within limits. Atherosclerotic calcification of the aorta. Degenerative changes of the spine. Lower thoracic vertebroplasty. No acute osseous pathology. IMPRESSION: No active cardiopulmonary disease. Electronically Signed   By: Anner Crete M.D.   On: 02/07/2020 16:49    Assessment/Plan:   1. General weakness - ongoing, minimal assistance with walker - home health PT/OT  2. FTT (failure to thrive) in adult -  ongoing, appetite remains poor - continue ensure shakes - recommend > 1500 calories daily - continue home health PT/OT  3. Type 2 diabetes mellitus with neurological complications (HCC) - stable without medication  4. Chronic obstructive pulmonary disease, unspecified COPD type ([place]) - stable without oxygen use  5. Hyperlipidemia, unspecified hyperlipidemia type - stable with statin medication  6. Essential hypertension - bp at goal < 150/90 - continue Norvasc and losartan regimen - continue to limit sodium in diet  7. Right arm pain - stable, she denies pain at this time - continue tylenol prn for pain    Patient is being discharged with the following home health services: PT/OT   Patient is being discharged with the following durable medical equipment: None    Patient has been advised to f/u with their PCP in 1-2 weeks to bring them up to date on their rehab stay.  Social services at facility was responsible for arranging this appointment.  Pt was provided with a 30 day supply of prescriptions for medications and refills must be obtained from their  PCP.  For controlled substances, a more limited supply may be provided adequate until PCP appointment only.  Future labs/tests needed:

## 2020-02-24 MED ORDER — GABAPENTIN 600 MG PO TABS: 600.0000 mg | ORAL_TABLET | Freq: Every day | ORAL | 0 refills | Status: DC

## 2020-02-24 MED ORDER — AMLODIPINE BESYLATE 10 MG PO TABS: 10.0000 mg | ORAL_TABLET | Freq: Every day | ORAL | 0 refills | Status: DC

## 2020-02-24 MED ORDER — ATORVASTATIN CALCIUM 80 MG PO TABS: 80.0000 mg | ORAL_TABLET | Freq: Every day | ORAL | 0 refills | Status: DC

## 2020-02-24 MED ORDER — LOSARTAN POTASSIUM 50 MG PO TABS: 50.0000 mg | ORAL_TABLET | Freq: Every day | ORAL | 0 refills | Status: DC

## 2020-03-01 DIAGNOSIS — R627 Adult failure to thrive: Secondary | ICD-10-CM | POA: Diagnosis not present

## 2020-03-01 DIAGNOSIS — G4733 Obstructive sleep apnea (adult) (pediatric): Secondary | ICD-10-CM | POA: Diagnosis not present

## 2020-03-01 DIAGNOSIS — E039 Hypothyroidism, unspecified: Secondary | ICD-10-CM | POA: Diagnosis not present

## 2020-03-01 DIAGNOSIS — E1140 Type 2 diabetes mellitus with diabetic neuropathy, unspecified: Secondary | ICD-10-CM | POA: Diagnosis not present

## 2020-03-01 DIAGNOSIS — I1310 Hypertensive heart and chronic kidney disease without heart failure, with stage 1 through stage 4 chronic kidney disease, or unspecified chronic kidney disease: Secondary | ICD-10-CM | POA: Diagnosis not present

## 2020-03-01 DIAGNOSIS — E1122 Type 2 diabetes mellitus with diabetic chronic kidney disease: Secondary | ICD-10-CM | POA: Diagnosis not present

## 2020-03-01 DIAGNOSIS — N1831 Chronic kidney disease, stage 3a: Secondary | ICD-10-CM | POA: Diagnosis not present

## 2020-03-01 DIAGNOSIS — I272 Pulmonary hypertension, unspecified: Secondary | ICD-10-CM | POA: Diagnosis not present

## 2020-03-01 DIAGNOSIS — R531 Weakness: Secondary | ICD-10-CM | POA: Diagnosis not present

## 2020-03-05 DIAGNOSIS — G4733 Obstructive sleep apnea (adult) (pediatric): Secondary | ICD-10-CM | POA: Diagnosis not present

## 2020-03-05 DIAGNOSIS — H9193 Unspecified hearing loss, bilateral: Secondary | ICD-10-CM | POA: Diagnosis not present

## 2020-03-05 DIAGNOSIS — E1149 Type 2 diabetes mellitus with other diabetic neurological complication: Secondary | ICD-10-CM | POA: Diagnosis not present

## 2020-03-05 DIAGNOSIS — I10 Essential (primary) hypertension: Secondary | ICD-10-CM | POA: Diagnosis not present

## 2020-03-05 DIAGNOSIS — J449 Chronic obstructive pulmonary disease, unspecified: Secondary | ICD-10-CM | POA: Diagnosis not present

## 2020-03-05 DIAGNOSIS — J309 Allergic rhinitis, unspecified: Secondary | ICD-10-CM | POA: Diagnosis not present

## 2020-03-05 DIAGNOSIS — N390 Urinary tract infection, site not specified: Secondary | ICD-10-CM | POA: Diagnosis not present

## 2020-03-05 DIAGNOSIS — E860 Dehydration: Secondary | ICD-10-CM | POA: Diagnosis not present

## 2020-03-05 DIAGNOSIS — M1120 Other chondrocalcinosis, unspecified site: Secondary | ICD-10-CM | POA: Diagnosis not present

## 2020-03-07 DIAGNOSIS — G4733 Obstructive sleep apnea (adult) (pediatric): Secondary | ICD-10-CM | POA: Diagnosis not present

## 2020-03-12 DIAGNOSIS — J309 Allergic rhinitis, unspecified: Secondary | ICD-10-CM | POA: Diagnosis not present

## 2020-03-12 DIAGNOSIS — E1149 Type 2 diabetes mellitus with other diabetic neurological complication: Secondary | ICD-10-CM | POA: Diagnosis not present

## 2020-03-12 DIAGNOSIS — M1120 Other chondrocalcinosis, unspecified site: Secondary | ICD-10-CM | POA: Diagnosis not present

## 2020-03-12 DIAGNOSIS — I10 Essential (primary) hypertension: Secondary | ICD-10-CM | POA: Diagnosis not present

## 2020-03-12 DIAGNOSIS — E860 Dehydration: Secondary | ICD-10-CM | POA: Diagnosis not present

## 2020-03-12 DIAGNOSIS — H9193 Unspecified hearing loss, bilateral: Secondary | ICD-10-CM | POA: Diagnosis not present

## 2020-03-12 DIAGNOSIS — J449 Chronic obstructive pulmonary disease, unspecified: Secondary | ICD-10-CM | POA: Diagnosis not present

## 2020-03-12 DIAGNOSIS — N390 Urinary tract infection, site not specified: Secondary | ICD-10-CM | POA: Diagnosis not present

## 2020-03-12 DIAGNOSIS — G4733 Obstructive sleep apnea (adult) (pediatric): Secondary | ICD-10-CM | POA: Diagnosis not present

## 2020-03-13 DIAGNOSIS — I10 Essential (primary) hypertension: Secondary | ICD-10-CM | POA: Diagnosis not present

## 2020-03-13 DIAGNOSIS — J449 Chronic obstructive pulmonary disease, unspecified: Secondary | ICD-10-CM | POA: Diagnosis not present

## 2020-03-13 DIAGNOSIS — G4733 Obstructive sleep apnea (adult) (pediatric): Secondary | ICD-10-CM | POA: Diagnosis not present

## 2020-03-13 DIAGNOSIS — M1120 Other chondrocalcinosis, unspecified site: Secondary | ICD-10-CM | POA: Diagnosis not present

## 2020-03-13 DIAGNOSIS — E1149 Type 2 diabetes mellitus with other diabetic neurological complication: Secondary | ICD-10-CM | POA: Diagnosis not present

## 2020-03-13 DIAGNOSIS — N390 Urinary tract infection, site not specified: Secondary | ICD-10-CM | POA: Diagnosis not present

## 2020-03-13 DIAGNOSIS — E860 Dehydration: Secondary | ICD-10-CM | POA: Diagnosis not present

## 2020-03-13 DIAGNOSIS — H9193 Unspecified hearing loss, bilateral: Secondary | ICD-10-CM | POA: Diagnosis not present

## 2020-03-13 DIAGNOSIS — J309 Allergic rhinitis, unspecified: Secondary | ICD-10-CM | POA: Diagnosis not present

## 2020-03-15 DIAGNOSIS — J309 Allergic rhinitis, unspecified: Secondary | ICD-10-CM | POA: Diagnosis not present

## 2020-03-15 DIAGNOSIS — M1120 Other chondrocalcinosis, unspecified site: Secondary | ICD-10-CM | POA: Diagnosis not present

## 2020-03-15 DIAGNOSIS — E1149 Type 2 diabetes mellitus with other diabetic neurological complication: Secondary | ICD-10-CM | POA: Diagnosis not present

## 2020-03-15 DIAGNOSIS — H9193 Unspecified hearing loss, bilateral: Secondary | ICD-10-CM | POA: Diagnosis not present

## 2020-03-15 DIAGNOSIS — E860 Dehydration: Secondary | ICD-10-CM | POA: Diagnosis not present

## 2020-03-15 DIAGNOSIS — N390 Urinary tract infection, site not specified: Secondary | ICD-10-CM | POA: Diagnosis not present

## 2020-03-15 DIAGNOSIS — I10 Essential (primary) hypertension: Secondary | ICD-10-CM | POA: Diagnosis not present

## 2020-03-15 DIAGNOSIS — J449 Chronic obstructive pulmonary disease, unspecified: Secondary | ICD-10-CM | POA: Diagnosis not present

## 2020-03-15 DIAGNOSIS — G4733 Obstructive sleep apnea (adult) (pediatric): Secondary | ICD-10-CM | POA: Diagnosis not present

## 2020-03-20 DIAGNOSIS — E1149 Type 2 diabetes mellitus with other diabetic neurological complication: Secondary | ICD-10-CM | POA: Diagnosis not present

## 2020-03-20 DIAGNOSIS — E860 Dehydration: Secondary | ICD-10-CM | POA: Diagnosis not present

## 2020-03-20 DIAGNOSIS — J309 Allergic rhinitis, unspecified: Secondary | ICD-10-CM | POA: Diagnosis not present

## 2020-03-20 DIAGNOSIS — J449 Chronic obstructive pulmonary disease, unspecified: Secondary | ICD-10-CM | POA: Diagnosis not present

## 2020-03-20 DIAGNOSIS — G4733 Obstructive sleep apnea (adult) (pediatric): Secondary | ICD-10-CM | POA: Diagnosis not present

## 2020-03-20 DIAGNOSIS — I10 Essential (primary) hypertension: Secondary | ICD-10-CM | POA: Diagnosis not present

## 2020-03-20 DIAGNOSIS — N390 Urinary tract infection, site not specified: Secondary | ICD-10-CM | POA: Diagnosis not present

## 2020-03-20 DIAGNOSIS — H9193 Unspecified hearing loss, bilateral: Secondary | ICD-10-CM | POA: Diagnosis not present

## 2020-03-20 DIAGNOSIS — M1120 Other chondrocalcinosis, unspecified site: Secondary | ICD-10-CM | POA: Diagnosis not present

## 2020-03-21 DIAGNOSIS — M170 Bilateral primary osteoarthritis of knee: Secondary | ICD-10-CM | POA: Diagnosis not present

## 2020-03-21 DIAGNOSIS — R5383 Other fatigue: Secondary | ICD-10-CM | POA: Diagnosis not present

## 2020-03-21 DIAGNOSIS — E559 Vitamin D deficiency, unspecified: Secondary | ICD-10-CM | POA: Diagnosis not present

## 2020-03-21 DIAGNOSIS — M810 Age-related osteoporosis without current pathological fracture: Secondary | ICD-10-CM | POA: Diagnosis not present

## 2020-03-22 DIAGNOSIS — M1120 Other chondrocalcinosis, unspecified site: Secondary | ICD-10-CM | POA: Diagnosis not present

## 2020-03-22 DIAGNOSIS — E860 Dehydration: Secondary | ICD-10-CM | POA: Diagnosis not present

## 2020-03-22 DIAGNOSIS — N390 Urinary tract infection, site not specified: Secondary | ICD-10-CM | POA: Diagnosis not present

## 2020-03-22 DIAGNOSIS — H9193 Unspecified hearing loss, bilateral: Secondary | ICD-10-CM | POA: Diagnosis not present

## 2020-03-22 DIAGNOSIS — J449 Chronic obstructive pulmonary disease, unspecified: Secondary | ICD-10-CM | POA: Diagnosis not present

## 2020-03-22 DIAGNOSIS — G4733 Obstructive sleep apnea (adult) (pediatric): Secondary | ICD-10-CM | POA: Diagnosis not present

## 2020-03-22 DIAGNOSIS — J309 Allergic rhinitis, unspecified: Secondary | ICD-10-CM | POA: Diagnosis not present

## 2020-03-22 DIAGNOSIS — E1149 Type 2 diabetes mellitus with other diabetic neurological complication: Secondary | ICD-10-CM | POA: Diagnosis not present

## 2020-03-22 DIAGNOSIS — I10 Essential (primary) hypertension: Secondary | ICD-10-CM | POA: Diagnosis not present

## 2020-03-27 DIAGNOSIS — G4733 Obstructive sleep apnea (adult) (pediatric): Secondary | ICD-10-CM | POA: Diagnosis not present

## 2020-03-27 DIAGNOSIS — I10 Essential (primary) hypertension: Secondary | ICD-10-CM | POA: Diagnosis not present

## 2020-03-27 DIAGNOSIS — M1120 Other chondrocalcinosis, unspecified site: Secondary | ICD-10-CM | POA: Diagnosis not present

## 2020-03-27 DIAGNOSIS — J449 Chronic obstructive pulmonary disease, unspecified: Secondary | ICD-10-CM | POA: Diagnosis not present

## 2020-03-27 DIAGNOSIS — H9193 Unspecified hearing loss, bilateral: Secondary | ICD-10-CM | POA: Diagnosis not present

## 2020-03-27 DIAGNOSIS — N390 Urinary tract infection, site not specified: Secondary | ICD-10-CM | POA: Diagnosis not present

## 2020-03-27 DIAGNOSIS — E860 Dehydration: Secondary | ICD-10-CM | POA: Diagnosis not present

## 2020-03-27 DIAGNOSIS — J309 Allergic rhinitis, unspecified: Secondary | ICD-10-CM | POA: Diagnosis not present

## 2020-03-27 DIAGNOSIS — E1149 Type 2 diabetes mellitus with other diabetic neurological complication: Secondary | ICD-10-CM | POA: Diagnosis not present

## 2020-03-29 DIAGNOSIS — N390 Urinary tract infection, site not specified: Secondary | ICD-10-CM | POA: Diagnosis not present

## 2020-03-29 DIAGNOSIS — I10 Essential (primary) hypertension: Secondary | ICD-10-CM | POA: Diagnosis not present

## 2020-03-29 DIAGNOSIS — J309 Allergic rhinitis, unspecified: Secondary | ICD-10-CM | POA: Diagnosis not present

## 2020-03-29 DIAGNOSIS — G4733 Obstructive sleep apnea (adult) (pediatric): Secondary | ICD-10-CM | POA: Diagnosis not present

## 2020-03-29 DIAGNOSIS — E860 Dehydration: Secondary | ICD-10-CM | POA: Diagnosis not present

## 2020-03-29 DIAGNOSIS — H9193 Unspecified hearing loss, bilateral: Secondary | ICD-10-CM | POA: Diagnosis not present

## 2020-03-29 DIAGNOSIS — E1149 Type 2 diabetes mellitus with other diabetic neurological complication: Secondary | ICD-10-CM | POA: Diagnosis not present

## 2020-03-29 DIAGNOSIS — J449 Chronic obstructive pulmonary disease, unspecified: Secondary | ICD-10-CM | POA: Diagnosis not present

## 2020-03-29 DIAGNOSIS — M1120 Other chondrocalcinosis, unspecified site: Secondary | ICD-10-CM | POA: Diagnosis not present

## 2020-04-02 DIAGNOSIS — E1149 Type 2 diabetes mellitus with other diabetic neurological complication: Secondary | ICD-10-CM | POA: Diagnosis not present

## 2020-04-02 DIAGNOSIS — M1120 Other chondrocalcinosis, unspecified site: Secondary | ICD-10-CM | POA: Diagnosis not present

## 2020-04-02 DIAGNOSIS — I10 Essential (primary) hypertension: Secondary | ICD-10-CM | POA: Diagnosis not present

## 2020-04-02 DIAGNOSIS — G4733 Obstructive sleep apnea (adult) (pediatric): Secondary | ICD-10-CM | POA: Diagnosis not present

## 2020-04-02 DIAGNOSIS — H9193 Unspecified hearing loss, bilateral: Secondary | ICD-10-CM | POA: Diagnosis not present

## 2020-04-02 DIAGNOSIS — J309 Allergic rhinitis, unspecified: Secondary | ICD-10-CM | POA: Diagnosis not present

## 2020-04-02 DIAGNOSIS — E860 Dehydration: Secondary | ICD-10-CM | POA: Diagnosis not present

## 2020-04-02 DIAGNOSIS — N390 Urinary tract infection, site not specified: Secondary | ICD-10-CM | POA: Diagnosis not present

## 2020-04-02 DIAGNOSIS — J449 Chronic obstructive pulmonary disease, unspecified: Secondary | ICD-10-CM | POA: Diagnosis not present

## 2020-04-04 DIAGNOSIS — M1120 Other chondrocalcinosis, unspecified site: Secondary | ICD-10-CM | POA: Diagnosis not present

## 2020-04-04 DIAGNOSIS — M47819 Spondylosis without myelopathy or radiculopathy, site unspecified: Secondary | ICD-10-CM | POA: Diagnosis not present

## 2020-04-04 DIAGNOSIS — R627 Adult failure to thrive: Secondary | ICD-10-CM | POA: Diagnosis not present

## 2020-04-04 DIAGNOSIS — J449 Chronic obstructive pulmonary disease, unspecified: Secondary | ICD-10-CM | POA: Diagnosis not present

## 2020-04-04 DIAGNOSIS — E860 Dehydration: Secondary | ICD-10-CM | POA: Diagnosis not present

## 2020-04-04 DIAGNOSIS — I272 Pulmonary hypertension, unspecified: Secondary | ICD-10-CM | POA: Diagnosis not present

## 2020-04-04 DIAGNOSIS — E1140 Type 2 diabetes mellitus with diabetic neuropathy, unspecified: Secondary | ICD-10-CM | POA: Diagnosis not present

## 2020-04-04 DIAGNOSIS — E1149 Type 2 diabetes mellitus with other diabetic neurological complication: Secondary | ICD-10-CM | POA: Diagnosis not present

## 2020-04-04 DIAGNOSIS — G4733 Obstructive sleep apnea (adult) (pediatric): Secondary | ICD-10-CM | POA: Diagnosis not present

## 2020-04-04 DIAGNOSIS — R531 Weakness: Secondary | ICD-10-CM | POA: Diagnosis not present

## 2020-04-04 DIAGNOSIS — H9193 Unspecified hearing loss, bilateral: Secondary | ICD-10-CM | POA: Diagnosis not present

## 2020-04-04 DIAGNOSIS — J309 Allergic rhinitis, unspecified: Secondary | ICD-10-CM | POA: Diagnosis not present

## 2020-04-04 DIAGNOSIS — E1122 Type 2 diabetes mellitus with diabetic chronic kidney disease: Secondary | ICD-10-CM | POA: Diagnosis not present

## 2020-04-04 DIAGNOSIS — I1310 Hypertensive heart and chronic kidney disease without heart failure, with stage 1 through stage 4 chronic kidney disease, or unspecified chronic kidney disease: Secondary | ICD-10-CM | POA: Diagnosis not present

## 2020-04-04 DIAGNOSIS — N1831 Chronic kidney disease, stage 3a: Secondary | ICD-10-CM | POA: Diagnosis not present

## 2020-04-04 DIAGNOSIS — E039 Hypothyroidism, unspecified: Secondary | ICD-10-CM | POA: Diagnosis not present

## 2020-04-04 DIAGNOSIS — I10 Essential (primary) hypertension: Secondary | ICD-10-CM | POA: Diagnosis not present

## 2020-04-04 DIAGNOSIS — N390 Urinary tract infection, site not specified: Secondary | ICD-10-CM | POA: Diagnosis not present

## 2020-04-05 DIAGNOSIS — J309 Allergic rhinitis, unspecified: Secondary | ICD-10-CM | POA: Diagnosis not present

## 2020-04-05 DIAGNOSIS — I10 Essential (primary) hypertension: Secondary | ICD-10-CM | POA: Diagnosis not present

## 2020-04-05 DIAGNOSIS — G4733 Obstructive sleep apnea (adult) (pediatric): Secondary | ICD-10-CM | POA: Diagnosis not present

## 2020-04-05 DIAGNOSIS — J449 Chronic obstructive pulmonary disease, unspecified: Secondary | ICD-10-CM | POA: Diagnosis not present

## 2020-04-05 DIAGNOSIS — E860 Dehydration: Secondary | ICD-10-CM | POA: Diagnosis not present

## 2020-04-05 DIAGNOSIS — H9193 Unspecified hearing loss, bilateral: Secondary | ICD-10-CM | POA: Diagnosis not present

## 2020-04-05 DIAGNOSIS — E1149 Type 2 diabetes mellitus with other diabetic neurological complication: Secondary | ICD-10-CM | POA: Diagnosis not present

## 2020-04-05 DIAGNOSIS — M1120 Other chondrocalcinosis, unspecified site: Secondary | ICD-10-CM | POA: Diagnosis not present

## 2020-04-05 DIAGNOSIS — N390 Urinary tract infection, site not specified: Secondary | ICD-10-CM | POA: Diagnosis not present

## 2020-04-07 DIAGNOSIS — G4733 Obstructive sleep apnea (adult) (pediatric): Secondary | ICD-10-CM | POA: Diagnosis not present

## 2020-04-09 DIAGNOSIS — J309 Allergic rhinitis, unspecified: Secondary | ICD-10-CM | POA: Diagnosis not present

## 2020-04-09 DIAGNOSIS — E1149 Type 2 diabetes mellitus with other diabetic neurological complication: Secondary | ICD-10-CM | POA: Diagnosis not present

## 2020-04-09 DIAGNOSIS — N390 Urinary tract infection, site not specified: Secondary | ICD-10-CM | POA: Diagnosis not present

## 2020-04-09 DIAGNOSIS — I10 Essential (primary) hypertension: Secondary | ICD-10-CM | POA: Diagnosis not present

## 2020-04-09 DIAGNOSIS — E860 Dehydration: Secondary | ICD-10-CM | POA: Diagnosis not present

## 2020-04-09 DIAGNOSIS — G4733 Obstructive sleep apnea (adult) (pediatric): Secondary | ICD-10-CM | POA: Diagnosis not present

## 2020-04-09 DIAGNOSIS — M1120 Other chondrocalcinosis, unspecified site: Secondary | ICD-10-CM | POA: Diagnosis not present

## 2020-04-09 DIAGNOSIS — H9193 Unspecified hearing loss, bilateral: Secondary | ICD-10-CM | POA: Diagnosis not present

## 2020-04-09 DIAGNOSIS — J449 Chronic obstructive pulmonary disease, unspecified: Secondary | ICD-10-CM | POA: Diagnosis not present

## 2020-04-11 DIAGNOSIS — L851 Acquired keratosis [keratoderma] palmaris et plantaris: Secondary | ICD-10-CM | POA: Diagnosis not present

## 2020-04-11 DIAGNOSIS — I739 Peripheral vascular disease, unspecified: Secondary | ICD-10-CM | POA: Diagnosis not present

## 2020-04-11 DIAGNOSIS — L608 Other nail disorders: Secondary | ICD-10-CM | POA: Diagnosis not present

## 2020-04-11 DIAGNOSIS — E1159 Type 2 diabetes mellitus with other circulatory complications: Secondary | ICD-10-CM | POA: Diagnosis not present

## 2020-04-18 DIAGNOSIS — E559 Vitamin D deficiency, unspecified: Secondary | ICD-10-CM | POA: Diagnosis not present

## 2020-04-18 DIAGNOSIS — M810 Age-related osteoporosis without current pathological fracture: Secondary | ICD-10-CM | POA: Diagnosis not present

## 2020-04-19 DIAGNOSIS — J309 Allergic rhinitis, unspecified: Secondary | ICD-10-CM | POA: Diagnosis not present

## 2020-04-19 DIAGNOSIS — J449 Chronic obstructive pulmonary disease, unspecified: Secondary | ICD-10-CM | POA: Diagnosis not present

## 2020-04-19 DIAGNOSIS — E1149 Type 2 diabetes mellitus with other diabetic neurological complication: Secondary | ICD-10-CM | POA: Diagnosis not present

## 2020-04-19 DIAGNOSIS — M1120 Other chondrocalcinosis, unspecified site: Secondary | ICD-10-CM | POA: Diagnosis not present

## 2020-04-19 DIAGNOSIS — E860 Dehydration: Secondary | ICD-10-CM | POA: Diagnosis not present

## 2020-04-19 DIAGNOSIS — H9193 Unspecified hearing loss, bilateral: Secondary | ICD-10-CM | POA: Diagnosis not present

## 2020-04-19 DIAGNOSIS — N390 Urinary tract infection, site not specified: Secondary | ICD-10-CM | POA: Diagnosis not present

## 2020-04-19 DIAGNOSIS — I10 Essential (primary) hypertension: Secondary | ICD-10-CM | POA: Diagnosis not present

## 2020-04-19 DIAGNOSIS — G4733 Obstructive sleep apnea (adult) (pediatric): Secondary | ICD-10-CM | POA: Diagnosis not present

## 2020-04-20 DIAGNOSIS — I10 Essential (primary) hypertension: Secondary | ICD-10-CM | POA: Diagnosis not present

## 2020-04-20 DIAGNOSIS — J309 Allergic rhinitis, unspecified: Secondary | ICD-10-CM | POA: Diagnosis not present

## 2020-04-20 DIAGNOSIS — G4733 Obstructive sleep apnea (adult) (pediatric): Secondary | ICD-10-CM | POA: Diagnosis not present

## 2020-04-20 DIAGNOSIS — J449 Chronic obstructive pulmonary disease, unspecified: Secondary | ICD-10-CM | POA: Diagnosis not present

## 2020-04-20 DIAGNOSIS — H9193 Unspecified hearing loss, bilateral: Secondary | ICD-10-CM | POA: Diagnosis not present

## 2020-04-20 DIAGNOSIS — M1120 Other chondrocalcinosis, unspecified site: Secondary | ICD-10-CM | POA: Diagnosis not present

## 2020-04-20 DIAGNOSIS — E1149 Type 2 diabetes mellitus with other diabetic neurological complication: Secondary | ICD-10-CM | POA: Diagnosis not present

## 2020-04-20 DIAGNOSIS — E860 Dehydration: Secondary | ICD-10-CM | POA: Diagnosis not present

## 2020-04-20 DIAGNOSIS — N390 Urinary tract infection, site not specified: Secondary | ICD-10-CM | POA: Diagnosis not present

## 2020-04-25 DIAGNOSIS — I10 Essential (primary) hypertension: Secondary | ICD-10-CM | POA: Diagnosis not present

## 2020-04-25 DIAGNOSIS — E860 Dehydration: Secondary | ICD-10-CM | POA: Diagnosis not present

## 2020-04-25 DIAGNOSIS — J309 Allergic rhinitis, unspecified: Secondary | ICD-10-CM | POA: Diagnosis not present

## 2020-04-25 DIAGNOSIS — J449 Chronic obstructive pulmonary disease, unspecified: Secondary | ICD-10-CM | POA: Diagnosis not present

## 2020-04-25 DIAGNOSIS — H9193 Unspecified hearing loss, bilateral: Secondary | ICD-10-CM | POA: Diagnosis not present

## 2020-04-25 DIAGNOSIS — G4733 Obstructive sleep apnea (adult) (pediatric): Secondary | ICD-10-CM | POA: Diagnosis not present

## 2020-04-25 DIAGNOSIS — M1120 Other chondrocalcinosis, unspecified site: Secondary | ICD-10-CM | POA: Diagnosis not present

## 2020-04-25 DIAGNOSIS — N390 Urinary tract infection, site not specified: Secondary | ICD-10-CM | POA: Diagnosis not present

## 2020-04-25 DIAGNOSIS — E1149 Type 2 diabetes mellitus with other diabetic neurological complication: Secondary | ICD-10-CM | POA: Diagnosis not present

## 2020-04-30 DIAGNOSIS — I10 Essential (primary) hypertension: Secondary | ICD-10-CM | POA: Diagnosis not present

## 2020-04-30 DIAGNOSIS — G4733 Obstructive sleep apnea (adult) (pediatric): Secondary | ICD-10-CM | POA: Diagnosis not present

## 2020-04-30 DIAGNOSIS — J309 Allergic rhinitis, unspecified: Secondary | ICD-10-CM | POA: Diagnosis not present

## 2020-04-30 DIAGNOSIS — J449 Chronic obstructive pulmonary disease, unspecified: Secondary | ICD-10-CM | POA: Diagnosis not present

## 2020-04-30 DIAGNOSIS — M1120 Other chondrocalcinosis, unspecified site: Secondary | ICD-10-CM | POA: Diagnosis not present

## 2020-04-30 DIAGNOSIS — E860 Dehydration: Secondary | ICD-10-CM | POA: Diagnosis not present

## 2020-04-30 DIAGNOSIS — N390 Urinary tract infection, site not specified: Secondary | ICD-10-CM | POA: Diagnosis not present

## 2020-04-30 DIAGNOSIS — H9193 Unspecified hearing loss, bilateral: Secondary | ICD-10-CM | POA: Diagnosis not present

## 2020-04-30 DIAGNOSIS — E1149 Type 2 diabetes mellitus with other diabetic neurological complication: Secondary | ICD-10-CM | POA: Diagnosis not present

## 2020-05-04 DIAGNOSIS — J449 Chronic obstructive pulmonary disease, unspecified: Secondary | ICD-10-CM | POA: Diagnosis not present

## 2020-05-04 DIAGNOSIS — H9193 Unspecified hearing loss, bilateral: Secondary | ICD-10-CM | POA: Diagnosis not present

## 2020-05-04 DIAGNOSIS — N390 Urinary tract infection, site not specified: Secondary | ICD-10-CM | POA: Diagnosis not present

## 2020-05-04 DIAGNOSIS — E785 Hyperlipidemia, unspecified: Secondary | ICD-10-CM | POA: Diagnosis not present

## 2020-05-04 DIAGNOSIS — I10 Essential (primary) hypertension: Secondary | ICD-10-CM | POA: Diagnosis not present

## 2020-05-04 DIAGNOSIS — M1120 Other chondrocalcinosis, unspecified site: Secondary | ICD-10-CM | POA: Diagnosis not present

## 2020-05-04 DIAGNOSIS — G4733 Obstructive sleep apnea (adult) (pediatric): Secondary | ICD-10-CM | POA: Diagnosis not present

## 2020-05-04 DIAGNOSIS — J309 Allergic rhinitis, unspecified: Secondary | ICD-10-CM | POA: Diagnosis not present

## 2020-05-04 DIAGNOSIS — E1149 Type 2 diabetes mellitus with other diabetic neurological complication: Secondary | ICD-10-CM | POA: Diagnosis not present

## 2020-05-04 DIAGNOSIS — E860 Dehydration: Secondary | ICD-10-CM | POA: Diagnosis not present

## 2020-05-05 DIAGNOSIS — G4733 Obstructive sleep apnea (adult) (pediatric): Secondary | ICD-10-CM | POA: Diagnosis not present

## 2020-05-08 DIAGNOSIS — J449 Chronic obstructive pulmonary disease, unspecified: Secondary | ICD-10-CM | POA: Diagnosis not present

## 2020-05-08 DIAGNOSIS — M1120 Other chondrocalcinosis, unspecified site: Secondary | ICD-10-CM | POA: Diagnosis not present

## 2020-05-08 DIAGNOSIS — J309 Allergic rhinitis, unspecified: Secondary | ICD-10-CM | POA: Diagnosis not present

## 2020-05-08 DIAGNOSIS — H9193 Unspecified hearing loss, bilateral: Secondary | ICD-10-CM | POA: Diagnosis not present

## 2020-05-08 DIAGNOSIS — N390 Urinary tract infection, site not specified: Secondary | ICD-10-CM | POA: Diagnosis not present

## 2020-05-08 DIAGNOSIS — G4733 Obstructive sleep apnea (adult) (pediatric): Secondary | ICD-10-CM | POA: Diagnosis not present

## 2020-05-08 DIAGNOSIS — E860 Dehydration: Secondary | ICD-10-CM | POA: Diagnosis not present

## 2020-05-08 DIAGNOSIS — E1149 Type 2 diabetes mellitus with other diabetic neurological complication: Secondary | ICD-10-CM | POA: Diagnosis not present

## 2020-05-08 DIAGNOSIS — I10 Essential (primary) hypertension: Secondary | ICD-10-CM | POA: Diagnosis not present

## 2020-05-14 DIAGNOSIS — E1149 Type 2 diabetes mellitus with other diabetic neurological complication: Secondary | ICD-10-CM | POA: Diagnosis not present

## 2020-05-14 DIAGNOSIS — M1120 Other chondrocalcinosis, unspecified site: Secondary | ICD-10-CM | POA: Diagnosis not present

## 2020-05-14 DIAGNOSIS — J309 Allergic rhinitis, unspecified: Secondary | ICD-10-CM | POA: Diagnosis not present

## 2020-05-14 DIAGNOSIS — N390 Urinary tract infection, site not specified: Secondary | ICD-10-CM | POA: Diagnosis not present

## 2020-05-14 DIAGNOSIS — I10 Essential (primary) hypertension: Secondary | ICD-10-CM | POA: Diagnosis not present

## 2020-05-14 DIAGNOSIS — J449 Chronic obstructive pulmonary disease, unspecified: Secondary | ICD-10-CM | POA: Diagnosis not present

## 2020-05-14 DIAGNOSIS — G4733 Obstructive sleep apnea (adult) (pediatric): Secondary | ICD-10-CM | POA: Diagnosis not present

## 2020-05-14 DIAGNOSIS — E860 Dehydration: Secondary | ICD-10-CM | POA: Diagnosis not present

## 2020-05-14 DIAGNOSIS — H9193 Unspecified hearing loss, bilateral: Secondary | ICD-10-CM | POA: Diagnosis not present

## 2020-05-21 DIAGNOSIS — H9193 Unspecified hearing loss, bilateral: Secondary | ICD-10-CM | POA: Diagnosis not present

## 2020-05-21 DIAGNOSIS — J309 Allergic rhinitis, unspecified: Secondary | ICD-10-CM | POA: Diagnosis not present

## 2020-05-21 DIAGNOSIS — N390 Urinary tract infection, site not specified: Secondary | ICD-10-CM | POA: Diagnosis not present

## 2020-05-21 DIAGNOSIS — E1149 Type 2 diabetes mellitus with other diabetic neurological complication: Secondary | ICD-10-CM | POA: Diagnosis not present

## 2020-05-21 DIAGNOSIS — G4733 Obstructive sleep apnea (adult) (pediatric): Secondary | ICD-10-CM | POA: Diagnosis not present

## 2020-05-21 DIAGNOSIS — I10 Essential (primary) hypertension: Secondary | ICD-10-CM | POA: Diagnosis not present

## 2020-05-21 DIAGNOSIS — E860 Dehydration: Secondary | ICD-10-CM | POA: Diagnosis not present

## 2020-05-21 DIAGNOSIS — J449 Chronic obstructive pulmonary disease, unspecified: Secondary | ICD-10-CM | POA: Diagnosis not present

## 2020-05-21 DIAGNOSIS — M1120 Other chondrocalcinosis, unspecified site: Secondary | ICD-10-CM | POA: Diagnosis not present

## 2020-05-30 DIAGNOSIS — M1712 Unilateral primary osteoarthritis, left knee: Secondary | ICD-10-CM | POA: Diagnosis not present

## 2020-06-01 DIAGNOSIS — H9193 Unspecified hearing loss, bilateral: Secondary | ICD-10-CM | POA: Diagnosis not present

## 2020-06-01 DIAGNOSIS — E1149 Type 2 diabetes mellitus with other diabetic neurological complication: Secondary | ICD-10-CM | POA: Diagnosis not present

## 2020-06-01 DIAGNOSIS — E860 Dehydration: Secondary | ICD-10-CM | POA: Diagnosis not present

## 2020-06-01 DIAGNOSIS — J309 Allergic rhinitis, unspecified: Secondary | ICD-10-CM | POA: Diagnosis not present

## 2020-06-01 DIAGNOSIS — I10 Essential (primary) hypertension: Secondary | ICD-10-CM | POA: Diagnosis not present

## 2020-06-01 DIAGNOSIS — N390 Urinary tract infection, site not specified: Secondary | ICD-10-CM | POA: Diagnosis not present

## 2020-06-01 DIAGNOSIS — M1120 Other chondrocalcinosis, unspecified site: Secondary | ICD-10-CM | POA: Diagnosis not present

## 2020-06-01 DIAGNOSIS — G4733 Obstructive sleep apnea (adult) (pediatric): Secondary | ICD-10-CM | POA: Diagnosis not present

## 2020-06-01 DIAGNOSIS — J449 Chronic obstructive pulmonary disease, unspecified: Secondary | ICD-10-CM | POA: Diagnosis not present

## 2020-06-03 DIAGNOSIS — I10 Essential (primary) hypertension: Secondary | ICD-10-CM | POA: Diagnosis not present

## 2020-06-03 DIAGNOSIS — J309 Allergic rhinitis, unspecified: Secondary | ICD-10-CM | POA: Diagnosis not present

## 2020-06-03 DIAGNOSIS — N390 Urinary tract infection, site not specified: Secondary | ICD-10-CM | POA: Diagnosis not present

## 2020-06-03 DIAGNOSIS — J449 Chronic obstructive pulmonary disease, unspecified: Secondary | ICD-10-CM | POA: Diagnosis not present

## 2020-06-03 DIAGNOSIS — E1149 Type 2 diabetes mellitus with other diabetic neurological complication: Secondary | ICD-10-CM | POA: Diagnosis not present

## 2020-06-03 DIAGNOSIS — E860 Dehydration: Secondary | ICD-10-CM | POA: Diagnosis not present

## 2020-06-03 DIAGNOSIS — M1120 Other chondrocalcinosis, unspecified site: Secondary | ICD-10-CM | POA: Diagnosis not present

## 2020-06-03 DIAGNOSIS — H9193 Unspecified hearing loss, bilateral: Secondary | ICD-10-CM | POA: Diagnosis not present

## 2020-06-03 DIAGNOSIS — G4733 Obstructive sleep apnea (adult) (pediatric): Secondary | ICD-10-CM | POA: Diagnosis not present

## 2020-06-05 DIAGNOSIS — G4733 Obstructive sleep apnea (adult) (pediatric): Secondary | ICD-10-CM | POA: Diagnosis not present

## 2020-06-06 DIAGNOSIS — M1712 Unilateral primary osteoarthritis, left knee: Secondary | ICD-10-CM | POA: Diagnosis not present

## 2020-06-13 DIAGNOSIS — M1712 Unilateral primary osteoarthritis, left knee: Secondary | ICD-10-CM | POA: Diagnosis not present

## 2020-06-14 DIAGNOSIS — E1149 Type 2 diabetes mellitus with other diabetic neurological complication: Secondary | ICD-10-CM | POA: Diagnosis not present

## 2020-06-14 DIAGNOSIS — I10 Essential (primary) hypertension: Secondary | ICD-10-CM | POA: Diagnosis not present

## 2020-06-14 DIAGNOSIS — H9193 Unspecified hearing loss, bilateral: Secondary | ICD-10-CM | POA: Diagnosis not present

## 2020-06-14 DIAGNOSIS — J309 Allergic rhinitis, unspecified: Secondary | ICD-10-CM | POA: Diagnosis not present

## 2020-06-14 DIAGNOSIS — N390 Urinary tract infection, site not specified: Secondary | ICD-10-CM | POA: Diagnosis not present

## 2020-06-14 DIAGNOSIS — E860 Dehydration: Secondary | ICD-10-CM | POA: Diagnosis not present

## 2020-06-14 DIAGNOSIS — G4733 Obstructive sleep apnea (adult) (pediatric): Secondary | ICD-10-CM | POA: Diagnosis not present

## 2020-06-14 DIAGNOSIS — M1120 Other chondrocalcinosis, unspecified site: Secondary | ICD-10-CM | POA: Diagnosis not present

## 2020-06-14 DIAGNOSIS — J449 Chronic obstructive pulmonary disease, unspecified: Secondary | ICD-10-CM | POA: Diagnosis not present

## 2020-06-27 DIAGNOSIS — E860 Dehydration: Secondary | ICD-10-CM | POA: Diagnosis not present

## 2020-06-27 DIAGNOSIS — H9193 Unspecified hearing loss, bilateral: Secondary | ICD-10-CM | POA: Diagnosis not present

## 2020-06-27 DIAGNOSIS — M1120 Other chondrocalcinosis, unspecified site: Secondary | ICD-10-CM | POA: Diagnosis not present

## 2020-06-27 DIAGNOSIS — I10 Essential (primary) hypertension: Secondary | ICD-10-CM | POA: Diagnosis not present

## 2020-06-27 DIAGNOSIS — E1149 Type 2 diabetes mellitus with other diabetic neurological complication: Secondary | ICD-10-CM | POA: Diagnosis not present

## 2020-06-27 DIAGNOSIS — J449 Chronic obstructive pulmonary disease, unspecified: Secondary | ICD-10-CM | POA: Diagnosis not present

## 2020-06-27 DIAGNOSIS — J309 Allergic rhinitis, unspecified: Secondary | ICD-10-CM | POA: Diagnosis not present

## 2020-06-27 DIAGNOSIS — N390 Urinary tract infection, site not specified: Secondary | ICD-10-CM | POA: Diagnosis not present

## 2020-06-27 DIAGNOSIS — G4733 Obstructive sleep apnea (adult) (pediatric): Secondary | ICD-10-CM | POA: Diagnosis not present

## 2020-07-05 DIAGNOSIS — G4733 Obstructive sleep apnea (adult) (pediatric): Secondary | ICD-10-CM | POA: Diagnosis not present

## 2020-07-09 DIAGNOSIS — E039 Hypothyroidism, unspecified: Secondary | ICD-10-CM | POA: Diagnosis not present

## 2020-07-09 DIAGNOSIS — I1310 Hypertensive heart and chronic kidney disease without heart failure, with stage 1 through stage 4 chronic kidney disease, or unspecified chronic kidney disease: Secondary | ICD-10-CM | POA: Diagnosis not present

## 2020-07-09 DIAGNOSIS — M47819 Spondylosis without myelopathy or radiculopathy, site unspecified: Secondary | ICD-10-CM | POA: Diagnosis not present

## 2020-07-09 DIAGNOSIS — E1122 Type 2 diabetes mellitus with diabetic chronic kidney disease: Secondary | ICD-10-CM | POA: Diagnosis not present

## 2020-07-09 DIAGNOSIS — E1140 Type 2 diabetes mellitus with diabetic neuropathy, unspecified: Secondary | ICD-10-CM | POA: Diagnosis not present

## 2020-07-09 DIAGNOSIS — R531 Weakness: Secondary | ICD-10-CM | POA: Diagnosis not present

## 2020-07-09 DIAGNOSIS — N1831 Chronic kidney disease, stage 3a: Secondary | ICD-10-CM | POA: Diagnosis not present

## 2020-07-09 DIAGNOSIS — I272 Pulmonary hypertension, unspecified: Secondary | ICD-10-CM | POA: Diagnosis not present

## 2020-07-09 DIAGNOSIS — E7800 Pure hypercholesterolemia, unspecified: Secondary | ICD-10-CM | POA: Diagnosis not present

## 2020-07-11 DIAGNOSIS — S0512XA Contusion of eyeball and orbital tissues, left eye, initial encounter: Secondary | ICD-10-CM | POA: Diagnosis not present

## 2020-07-11 DIAGNOSIS — S0990XA Unspecified injury of head, initial encounter: Secondary | ICD-10-CM | POA: Diagnosis not present

## 2020-07-11 DIAGNOSIS — S0590XA Unspecified injury of unspecified eye and orbit, initial encounter: Secondary | ICD-10-CM | POA: Diagnosis not present

## 2020-07-11 DIAGNOSIS — E041 Nontoxic single thyroid nodule: Secondary | ICD-10-CM | POA: Diagnosis not present

## 2020-07-11 DIAGNOSIS — R220 Localized swelling, mass and lump, head: Secondary | ICD-10-CM | POA: Diagnosis not present

## 2020-07-11 DIAGNOSIS — W01198A Fall on same level from slipping, tripping and stumbling with subsequent striking against other object, initial encounter: Secondary | ICD-10-CM | POA: Diagnosis not present

## 2020-07-11 DIAGNOSIS — M542 Cervicalgia: Secondary | ICD-10-CM | POA: Diagnosis not present

## 2020-07-11 DIAGNOSIS — S0083XA Contusion of other part of head, initial encounter: Secondary | ICD-10-CM | POA: Diagnosis not present

## 2020-07-12 DIAGNOSIS — M542 Cervicalgia: Secondary | ICD-10-CM | POA: Diagnosis not present

## 2020-07-12 DIAGNOSIS — W01198A Fall on same level from slipping, tripping and stumbling with subsequent striking against other object, initial encounter: Secondary | ICD-10-CM | POA: Diagnosis not present

## 2020-07-12 DIAGNOSIS — S0990XA Unspecified injury of head, initial encounter: Secondary | ICD-10-CM | POA: Diagnosis not present

## 2020-07-12 DIAGNOSIS — R220 Localized swelling, mass and lump, head: Secondary | ICD-10-CM | POA: Diagnosis not present

## 2020-07-12 DIAGNOSIS — S0512XA Contusion of eyeball and orbital tissues, left eye, initial encounter: Secondary | ICD-10-CM | POA: Diagnosis not present

## 2020-07-12 DIAGNOSIS — S0590XA Unspecified injury of unspecified eye and orbit, initial encounter: Secondary | ICD-10-CM | POA: Diagnosis not present

## 2020-08-05 DIAGNOSIS — G4733 Obstructive sleep apnea (adult) (pediatric): Secondary | ICD-10-CM | POA: Diagnosis not present

## 2020-09-04 DIAGNOSIS — G4733 Obstructive sleep apnea (adult) (pediatric): Secondary | ICD-10-CM | POA: Diagnosis not present

## 2020-10-03 DIAGNOSIS — M810 Age-related osteoporosis without current pathological fracture: Secondary | ICD-10-CM | POA: Diagnosis not present

## 2020-10-03 DIAGNOSIS — E039 Hypothyroidism, unspecified: Secondary | ICD-10-CM | POA: Diagnosis not present

## 2020-10-03 DIAGNOSIS — E7800 Pure hypercholesterolemia, unspecified: Secondary | ICD-10-CM | POA: Diagnosis not present

## 2020-10-03 DIAGNOSIS — E1140 Type 2 diabetes mellitus with diabetic neuropathy, unspecified: Secondary | ICD-10-CM | POA: Diagnosis not present

## 2020-10-05 DIAGNOSIS — G4733 Obstructive sleep apnea (adult) (pediatric): Secondary | ICD-10-CM | POA: Diagnosis not present

## 2020-11-26 DIAGNOSIS — M810 Age-related osteoporosis without current pathological fracture: Secondary | ICD-10-CM | POA: Diagnosis not present

## 2020-11-26 DIAGNOSIS — E1140 Type 2 diabetes mellitus with diabetic neuropathy, unspecified: Secondary | ICD-10-CM | POA: Diagnosis not present

## 2020-11-26 DIAGNOSIS — E1122 Type 2 diabetes mellitus with diabetic chronic kidney disease: Secondary | ICD-10-CM | POA: Diagnosis not present

## 2020-11-26 DIAGNOSIS — E039 Hypothyroidism, unspecified: Secondary | ICD-10-CM | POA: Diagnosis not present

## 2020-11-26 DIAGNOSIS — J439 Emphysema, unspecified: Secondary | ICD-10-CM | POA: Diagnosis not present

## 2020-11-26 DIAGNOSIS — Z1389 Encounter for screening for other disorder: Secondary | ICD-10-CM | POA: Diagnosis not present

## 2020-11-26 DIAGNOSIS — N1831 Chronic kidney disease, stage 3a: Secondary | ICD-10-CM | POA: Diagnosis not present

## 2020-11-26 DIAGNOSIS — I272 Pulmonary hypertension, unspecified: Secondary | ICD-10-CM | POA: Diagnosis not present

## 2020-11-26 DIAGNOSIS — Z1331 Encounter for screening for depression: Secondary | ICD-10-CM | POA: Diagnosis not present

## 2020-11-26 DIAGNOSIS — I1310 Hypertensive heart and chronic kidney disease without heart failure, with stage 1 through stage 4 chronic kidney disease, or unspecified chronic kidney disease: Secondary | ICD-10-CM | POA: Diagnosis not present

## 2020-11-26 DIAGNOSIS — Z0000 Encounter for general adult medical examination without abnormal findings: Secondary | ICD-10-CM | POA: Diagnosis not present

## 2021-06-25 DIAGNOSIS — E039 Hypothyroidism, unspecified: Secondary | ICD-10-CM | POA: Diagnosis not present

## 2021-06-25 DIAGNOSIS — N1831 Chronic kidney disease, stage 3a: Secondary | ICD-10-CM | POA: Diagnosis not present

## 2021-06-25 DIAGNOSIS — M47819 Spondylosis without myelopathy or radiculopathy, site unspecified: Secondary | ICD-10-CM | POA: Diagnosis not present

## 2021-06-25 DIAGNOSIS — D692 Other nonthrombocytopenic purpura: Secondary | ICD-10-CM | POA: Diagnosis not present

## 2021-06-25 DIAGNOSIS — I1310 Hypertensive heart and chronic kidney disease without heart failure, with stage 1 through stage 4 chronic kidney disease, or unspecified chronic kidney disease: Secondary | ICD-10-CM | POA: Diagnosis not present

## 2021-06-25 DIAGNOSIS — E1140 Type 2 diabetes mellitus with diabetic neuropathy, unspecified: Secondary | ICD-10-CM | POA: Diagnosis not present

## 2021-06-25 DIAGNOSIS — E1122 Type 2 diabetes mellitus with diabetic chronic kidney disease: Secondary | ICD-10-CM | POA: Diagnosis not present

## 2021-06-25 DIAGNOSIS — E7800 Pure hypercholesterolemia, unspecified: Secondary | ICD-10-CM | POA: Diagnosis not present

## 2021-06-25 DIAGNOSIS — M810 Age-related osteoporosis without current pathological fracture: Secondary | ICD-10-CM | POA: Diagnosis not present

## 2021-10-23 ENCOUNTER — Inpatient Hospital Stay (HOSPITAL_COMMUNITY)
Admission: EM | Admit: 2021-10-23 | Discharge: 2021-11-05 | DRG: 186 | Disposition: A | Discharge status: Skilled Nursing Facility | Payer: Medicare PPO | Attending: Internal Medicine | Admitting: Internal Medicine

## 2021-10-23 ENCOUNTER — Encounter (HOSPITAL_COMMUNITY): Payer: Self-pay

## 2021-10-23 ENCOUNTER — Emergency Department (HOSPITAL_COMMUNITY): Payer: Medicare PPO

## 2021-10-23 ENCOUNTER — Inpatient Hospital Stay (HOSPITAL_COMMUNITY): Payer: Medicare PPO

## 2021-10-23 ENCOUNTER — Other Ambulatory Visit: Payer: Self-pay

## 2021-10-23 DIAGNOSIS — I5030 Unspecified diastolic (congestive) heart failure: Secondary | ICD-10-CM | POA: Diagnosis present

## 2021-10-23 DIAGNOSIS — Z823 Family history of stroke: Secondary | ICD-10-CM

## 2021-10-23 DIAGNOSIS — E872 Acidosis, unspecified: Secondary | ICD-10-CM | POA: Diagnosis not present

## 2021-10-23 DIAGNOSIS — W19XXXA Unspecified fall, initial encounter: Secondary | ICD-10-CM | POA: Diagnosis not present

## 2021-10-23 DIAGNOSIS — K529 Noninfective gastroenteritis and colitis, unspecified: Secondary | ICD-10-CM | POA: Diagnosis not present

## 2021-10-23 DIAGNOSIS — N179 Acute kidney failure, unspecified: Secondary | ICD-10-CM | POA: Diagnosis present

## 2021-10-23 DIAGNOSIS — D352 Benign neoplasm of pituitary gland: Secondary | ICD-10-CM

## 2021-10-23 DIAGNOSIS — I10 Essential (primary) hypertension: Secondary | ICD-10-CM | POA: Diagnosis not present

## 2021-10-23 DIAGNOSIS — I951 Orthostatic hypotension: Secondary | ICD-10-CM | POA: Diagnosis not present

## 2021-10-23 DIAGNOSIS — I959 Hypotension, unspecified: Secondary | ICD-10-CM | POA: Diagnosis present

## 2021-10-23 DIAGNOSIS — J189 Pneumonia, unspecified organism: Secondary | ICD-10-CM | POA: Diagnosis present

## 2021-10-23 DIAGNOSIS — R578 Other shock: Secondary | ICD-10-CM | POA: Diagnosis not present

## 2021-10-23 DIAGNOSIS — R059 Cough, unspecified: Secondary | ICD-10-CM | POA: Diagnosis not present

## 2021-10-23 DIAGNOSIS — R109 Unspecified abdominal pain: Secondary | ICD-10-CM | POA: Diagnosis not present

## 2021-10-23 DIAGNOSIS — I350 Nonrheumatic aortic (valve) stenosis: Secondary | ICD-10-CM | POA: Diagnosis not present

## 2021-10-23 DIAGNOSIS — R579 Shock, unspecified: Secondary | ICD-10-CM

## 2021-10-23 DIAGNOSIS — I4891 Unspecified atrial fibrillation: Secondary | ICD-10-CM | POA: Diagnosis not present

## 2021-10-23 DIAGNOSIS — E1122 Type 2 diabetes mellitus with diabetic chronic kidney disease: Secondary | ICD-10-CM | POA: Diagnosis present

## 2021-10-23 DIAGNOSIS — J440 Chronic obstructive pulmonary disease with acute lower respiratory infection: Secondary | ICD-10-CM | POA: Diagnosis present

## 2021-10-23 DIAGNOSIS — E876 Hypokalemia: Secondary | ICD-10-CM | POA: Diagnosis present

## 2021-10-23 DIAGNOSIS — J90 Pleural effusion, not elsewhere classified: Secondary | ICD-10-CM | POA: Diagnosis not present

## 2021-10-23 DIAGNOSIS — J449 Chronic obstructive pulmonary disease, unspecified: Secondary | ICD-10-CM | POA: Diagnosis not present

## 2021-10-23 DIAGNOSIS — Z79899 Other long term (current) drug therapy: Secondary | ICD-10-CM

## 2021-10-23 DIAGNOSIS — Z803 Family history of malignant neoplasm of breast: Secondary | ICD-10-CM

## 2021-10-23 DIAGNOSIS — Z515 Encounter for palliative care: Secondary | ICD-10-CM

## 2021-10-23 DIAGNOSIS — R0689 Other abnormalities of breathing: Secondary | ICD-10-CM | POA: Diagnosis not present

## 2021-10-23 DIAGNOSIS — E1149 Type 2 diabetes mellitus with other diabetic neurological complication: Secondary | ICD-10-CM | POA: Diagnosis present

## 2021-10-23 DIAGNOSIS — Z87891 Personal history of nicotine dependence: Secondary | ICD-10-CM

## 2021-10-23 DIAGNOSIS — R64 Cachexia: Secondary | ICD-10-CM | POA: Diagnosis present

## 2021-10-23 DIAGNOSIS — J9690 Respiratory failure, unspecified, unspecified whether with hypoxia or hypercapnia: Secondary | ICD-10-CM | POA: Diagnosis not present

## 2021-10-23 DIAGNOSIS — R0600 Dyspnea, unspecified: Secondary | ICD-10-CM | POA: Diagnosis not present

## 2021-10-23 DIAGNOSIS — R079 Chest pain, unspecified: Secondary | ICD-10-CM | POA: Diagnosis not present

## 2021-10-23 DIAGNOSIS — G4733 Obstructive sleep apnea (adult) (pediatric): Secondary | ICD-10-CM | POA: Diagnosis present

## 2021-10-23 DIAGNOSIS — R0602 Shortness of breath: Secondary | ICD-10-CM | POA: Diagnosis not present

## 2021-10-23 DIAGNOSIS — E1151 Type 2 diabetes mellitus with diabetic peripheral angiopathy without gangrene: Secondary | ICD-10-CM | POA: Diagnosis present

## 2021-10-23 DIAGNOSIS — I110 Hypertensive heart disease with heart failure: Secondary | ICD-10-CM | POA: Diagnosis present

## 2021-10-23 DIAGNOSIS — E860 Dehydration: Secondary | ICD-10-CM | POA: Diagnosis present

## 2021-10-23 DIAGNOSIS — Z7401 Bed confinement status: Secondary | ICD-10-CM | POA: Diagnosis not present

## 2021-10-23 DIAGNOSIS — E785 Hyperlipidemia, unspecified: Secondary | ICD-10-CM | POA: Diagnosis present

## 2021-10-23 DIAGNOSIS — S0990XA Unspecified injury of head, initial encounter: Secondary | ICD-10-CM | POA: Diagnosis not present

## 2021-10-23 DIAGNOSIS — Z66 Do not resuscitate: Secondary | ICD-10-CM | POA: Diagnosis present

## 2021-10-23 DIAGNOSIS — Z20822 Contact with and (suspected) exposure to covid-19: Secondary | ICD-10-CM | POA: Diagnosis not present

## 2021-10-23 DIAGNOSIS — Z043 Encounter for examination and observation following other accident: Secondary | ICD-10-CM | POA: Diagnosis not present

## 2021-10-23 DIAGNOSIS — E11649 Type 2 diabetes mellitus with hypoglycemia without coma: Secondary | ICD-10-CM | POA: Diagnosis not present

## 2021-10-23 DIAGNOSIS — I480 Paroxysmal atrial fibrillation: Secondary | ICD-10-CM | POA: Diagnosis not present

## 2021-10-23 DIAGNOSIS — J9601 Acute respiratory failure with hypoxia: Secondary | ICD-10-CM | POA: Diagnosis present

## 2021-10-23 DIAGNOSIS — R918 Other nonspecific abnormal finding of lung field: Secondary | ICD-10-CM | POA: Diagnosis not present

## 2021-10-23 DIAGNOSIS — I700 Atherosclerosis of aorta: Secondary | ICD-10-CM | POA: Diagnosis present

## 2021-10-23 DIAGNOSIS — R0902 Hypoxemia: Secondary | ICD-10-CM | POA: Diagnosis not present

## 2021-10-23 DIAGNOSIS — D6489 Other specified anemias: Secondary | ICD-10-CM | POA: Diagnosis not present

## 2021-10-23 DIAGNOSIS — R531 Weakness: Secondary | ICD-10-CM | POA: Diagnosis not present

## 2021-10-23 DIAGNOSIS — Z7189 Other specified counseling: Secondary | ICD-10-CM | POA: Diagnosis not present

## 2021-10-23 DIAGNOSIS — Z4682 Encounter for fitting and adjustment of non-vascular catheter: Secondary | ICD-10-CM | POA: Diagnosis not present

## 2021-10-23 DIAGNOSIS — R54 Age-related physical debility: Secondary | ICD-10-CM | POA: Diagnosis present

## 2021-10-23 DIAGNOSIS — Z6823 Body mass index (BMI) 23.0-23.9, adult: Secondary | ICD-10-CM

## 2021-10-23 DIAGNOSIS — Z7901 Long term (current) use of anticoagulants: Secondary | ICD-10-CM

## 2021-10-23 DIAGNOSIS — J9811 Atelectasis: Secondary | ICD-10-CM | POA: Diagnosis not present

## 2021-10-23 DIAGNOSIS — J439 Emphysema, unspecified: Secondary | ICD-10-CM | POA: Diagnosis not present

## 2021-10-23 LAB — CBC WITH DIFFERENTIAL/PLATELET
Abs Immature Granulocytes: 0.11 10*3/uL — ABNORMAL HIGH (ref 0.00–0.07)
Basophils Absolute: 0 10*3/uL (ref 0.0–0.1)
Basophils Relative: 0 %
Eosinophils Absolute: 0 10*3/uL (ref 0.0–0.5)
Eosinophils Relative: 0 %
HCT: 43.4 % (ref 36.0–46.0)
Hemoglobin: 13.6 g/dL (ref 12.0–15.0)
Immature Granulocytes: 1 %
Lymphocytes Relative: 7 %
Lymphs Abs: 0.7 10*3/uL (ref 0.7–4.0)
MCH: 28.2 pg (ref 26.0–34.0)
MCHC: 31.3 g/dL (ref 30.0–36.0)
MCV: 89.9 fL (ref 80.0–100.0)
Monocytes Absolute: 1.1 10*3/uL — ABNORMAL HIGH (ref 0.1–1.0)
Monocytes Relative: 10 %
Neutro Abs: 8.3 10*3/uL — ABNORMAL HIGH (ref 1.7–7.7)
Neutrophils Relative %: 82 %
Platelets: 572 10*3/uL — ABNORMAL HIGH (ref 150–400)
RBC: 4.83 MIL/uL (ref 3.87–5.11)
RDW: 15.8 % — ABNORMAL HIGH (ref 11.5–15.5)
WBC: 10.3 10*3/uL (ref 4.0–10.5)
nRBC: 0 % (ref 0.0–0.2)

## 2021-10-23 LAB — COMPREHENSIVE METABOLIC PANEL
ALT: 9 U/L (ref 0–44)
AST: 16 U/L (ref 15–41)
Albumin: 3.2 g/dL — ABNORMAL LOW (ref 3.5–5.0)
Alkaline Phosphatase: 115 U/L (ref 38–126)
Anion gap: 15 (ref 5–15)
BUN: 21 mg/dL (ref 8–23)
CO2: 22 mmol/L (ref 22–32)
Calcium: 9.5 mg/dL (ref 8.9–10.3)
Chloride: 103 mmol/L (ref 98–111)
Creatinine, Ser: 1.03 mg/dL — ABNORMAL HIGH (ref 0.44–1.00)
GFR, Estimated: 53 mL/min — ABNORMAL LOW (ref >60)
Glucose, Bld: 90 mg/dL (ref 70–99)
Potassium: 3.7 mmol/L (ref 3.5–5.1)
Sodium: 140 mmol/L (ref 135–145)
Total Bilirubin: 1.3 mg/dL — ABNORMAL HIGH (ref 0.3–1.2)
Total Protein: 7.1 g/dL (ref 6.5–8.1)

## 2021-10-23 LAB — RESP PANEL BY RT-PCR (FLU A&B, COVID) ARPGX2
Influenza A by PCR: NEGATIVE
Influenza B by PCR: NEGATIVE
SARS Coronavirus 2 by RT PCR: NEGATIVE

## 2021-10-23 LAB — BODY FLUID CELL COUNT WITH DIFFERENTIAL
Eos, Fluid: 0 %
Lymphs, Fluid: 63 %
Monocyte-Macrophage-Serous Fluid: 8 % — ABNORMAL LOW (ref 50–90)
Neutrophil Count, Fluid: 29 % — ABNORMAL HIGH (ref 0–25)
Total Nucleated Cell Count, Fluid: 1795 cu mm — ABNORMAL HIGH (ref 0–1000)

## 2021-10-23 LAB — LACTIC ACID, PLASMA: Lactic Acid, Venous: 1.6 mmol/L (ref 0.5–1.9)

## 2021-10-23 LAB — LACTATE DEHYDROGENASE, PLEURAL OR PERITONEAL FLUID: LD, Fluid: 508 U/L — ABNORMAL HIGH (ref 3–23)

## 2021-10-23 LAB — PROTEIN, PLEURAL OR PERITONEAL FLUID: Total protein, fluid: 3.8 g/dL

## 2021-10-23 LAB — MRSA NEXT GEN BY PCR, NASAL: MRSA by PCR Next Gen: NOT DETECTED

## 2021-10-23 LAB — TROPONIN I (HIGH SENSITIVITY): Troponin I (High Sensitivity): 9 ng/L (ref <18)

## 2021-10-23 LAB — LACTATE DEHYDROGENASE: LDH: 213 U/L — ABNORMAL HIGH (ref 98–192)

## 2021-10-23 LAB — PROTEIN, TOTAL: Total Protein: 7 g/dL (ref 6.5–8.1)

## 2021-10-23 MED ORDER — POLYETHYLENE GLYCOL 3350 17 G PO PACK
17.0000 g | PACK | Freq: Every day | ORAL | Status: DC | PRN
  Administered 2021-10-25: 17 g via ORAL
  Filled 2021-10-23: qty 1

## 2021-10-23 MED ORDER — AMIODARONE HCL IN DEXTROSE 360-4.14 MG/200ML-% IV SOLN
60.0000 mg/h | INTRAVENOUS | Status: DC
  Administered 2021-10-23 – 2021-10-24 (×2): 60 mg/h via INTRAVENOUS
  Filled 2021-10-23: qty 200

## 2021-10-23 MED ORDER — FENTANYL CITRATE PF 50 MCG/ML IJ SOSY: 100.0000 ug | PREFILLED_SYRINGE | Freq: Once | INTRAMUSCULAR | Status: AC

## 2021-10-23 MED ORDER — FENTANYL CITRATE PF 50 MCG/ML IJ SOSY
PREFILLED_SYRINGE | INTRAMUSCULAR | Status: AC
  Administered 2021-10-23: 100 ug via INTRAVENOUS
  Filled 2021-10-23: qty 2

## 2021-10-23 MED ORDER — IOHEXOL 350 MG/ML SOLN
75.0000 mL | Freq: Once | INTRAVENOUS | Status: AC | PRN
Start: 2021-10-23 — End: 2021-10-23
  Administered 2021-10-23: 75 mL via INTRAVENOUS

## 2021-10-23 MED ORDER — SODIUM CHLORIDE 0.9 % IV SOLN: 250.0000 mL | INTRAVENOUS | Status: DC

## 2021-10-23 MED ORDER — CHLORHEXIDINE GLUCONATE CLOTH 2 % EX PADS
6.0000 | MEDICATED_PAD | Freq: Every day | CUTANEOUS | Status: DC
  Administered 2021-10-23 – 2021-11-03 (×9): 6 via TOPICAL

## 2021-10-23 MED ORDER — HEPARIN SODIUM (PORCINE) 5000 UNIT/ML IJ SOLN
5000.0000 [IU] | Freq: Three times a day (TID) | INTRAMUSCULAR | Status: DC
  Administered 2021-10-24: 5000 [IU] via SUBCUTANEOUS
  Filled 2021-10-23: qty 1

## 2021-10-23 MED ORDER — SODIUM CHLORIDE 0.9 % IV SOLN
25.0000 ug/min | INTRAVENOUS | Status: DC
  Filled 2021-10-23: qty 2

## 2021-10-23 MED ORDER — SODIUM CHLORIDE 0.9 % IV SOLN
250.0000 mL | INTRAVENOUS | Status: DC
  Administered 2021-10-23: 250 mL via INTRAVENOUS

## 2021-10-23 MED ORDER — FENTANYL CITRATE PF 50 MCG/ML IJ SOSY: 100.0000 ug | PREFILLED_SYRINGE | Freq: Once | INTRAMUSCULAR | Status: DC

## 2021-10-23 MED ORDER — LACTATED RINGERS IV BOLUS
1000.0000 mL | Freq: Once | INTRAVENOUS | Status: AC
  Administered 2021-10-23: 1000 mL via INTRAVENOUS

## 2021-10-23 MED ORDER — AMIODARONE LOAD VIA INFUSION
150.0000 mg | Freq: Once | INTRAVENOUS | Status: AC
  Administered 2021-10-23: 150 mg via INTRAVENOUS
  Filled 2021-10-23: qty 83.34

## 2021-10-23 MED ORDER — AMIODARONE HCL IN DEXTROSE 360-4.14 MG/200ML-% IV SOLN
30.0000 mg/h | INTRAVENOUS | Status: DC
  Administered 2021-10-24 (×2): 30 mg/h via INTRAVENOUS
  Filled 2021-10-23 (×3): qty 200

## 2021-10-23 MED ORDER — SODIUM CHLORIDE 0.9% FLUSH
10.0000 mL | Freq: Three times a day (TID) | INTRAVENOUS | Status: DC
  Administered 2021-10-24 – 2021-10-26 (×7): 10 mL via INTRAPLEURAL

## 2021-10-23 MED ORDER — PHENYLEPHRINE 80 MCG/ML (10ML) SYRINGE FOR IV PUSH (FOR BLOOD PRESSURE SUPPORT)
PREFILLED_SYRINGE | INTRAVENOUS | Status: AC
  Filled 2021-10-23: qty 10

## 2021-10-23 MED ORDER — SODIUM CHLORIDE 0.9 % IV SOLN
25.0000 ug/min | INTRAVENOUS | Status: DC
  Administered 2021-10-23: 25 ug/min via INTRAVENOUS
  Filled 2021-10-23: qty 2

## 2021-10-23 MED ORDER — DOCUSATE SODIUM 100 MG PO CAPS
100.0000 mg | ORAL_CAPSULE | Freq: Two times a day (BID) | ORAL | Status: DC | PRN
  Administered 2021-10-25: 100 mg via ORAL
  Filled 2021-10-23: qty 1

## 2021-10-23 MED ORDER — SODIUM CHLORIDE 0.9 % IV SOLN: 0.0000 ug/min | INTRAVENOUS | Status: DC

## 2021-10-23 NOTE — Progress Notes (Signed)
This RN spoke with the patient about contacting her family. The patient's daughter [name] is listed in the chart. The patient requested that staff members wait until morning to call her. The patient stated that her family does not know that she is here to her knowledge. The patient is A&Ox4. Will encourage patient to call the daughter in the morning.

## 2021-10-23 NOTE — H&P (Signed)
NAME:  [name], MRN:  [id number], DOB:  [birth year], LOS: 0 ADMISSION DATE:  10/23/2021 CONSULTATION DATE:  10/23/2021 REFERRING MD:  [place] - EDP CHIEF COMPLAINT:  Hypotension in the setting of Afib with RVR   History of Present Illness:  87 year old woman who presented to [facility] ED 9/6 post-mechanical fall, also with decreased PO intake/weakness x 5 days. Hit posterior head while getting out of the bathroom; no LOC. Not on AC. PMHx significant for HTN, HLD, COPD/OSA, T2DM, fibromuscular dysplasia, pseudogout.  Per chart review, patient had been feeling very weak and sustained a mechanical fall when ambulating back from the restroom. She struck her posterior head on the floor but did not have LOC. Denies recent fever/chills, CP/SOB, n/v/d or abdominal pain. Endorses HA s/p fall. EMS noted mild hypotension in the field, initially corrected after fluid administration.  On ED evaluation, afebrile with HR 100s, mildly tachypneic to 25, BP 129/62, SpO2 91% on RA. Labs notable for WBC/H&H WNL, Plt 572, lytes WNL, Cr 1.03 (baseline 0.8), normal AST/ALT, Tbili 1.3. Serum LDH 213, trop WNL. LA 1.6. COVID/Flu negative. CT Head/Cspine NAICA, no fracture. CXR with large L pleural effusion. CTA Chest negative for PE, large left pleural effusion noted with R mediastinal shift, occluded LLL bronchus, concern for poorly defined LLL mass.   PCCM consulted for chest tube placement/new LLL mass. Patient became hypotensive requiring Neosynephrine initiation and was admitted to ICU.  Pertinent Medical History:   Past Medical History:  Diagnosis Date   Allergic rhinitis    Arthritis    Complication of anesthesia    Hard time waking up   COPD (chronic obstructive pulmonary disease) ([place])    PFT 08/08/09-FEV1 1.98/107; R 0.77; small airway obst w/tresp to dilator;DLCO 44%   Diabetes mellitus    Fibromuscular dysplasia (HCC)    RAS   Hyperlipidemia    Hypertension    PONV (postoperative nausea and  vomiting)    Pseudogout    Sleep apnea    AHI 89/hr   Significant Hospital Events: Including procedures, antibiotic start and stop dates in addition to other pertinent events   9/6 - Presented to [facility] ED for mechanical fall with weakness, poor PO intake. CT Chest negative for PE, c/f new LLL mass and large L pleural effusion with R mediastinal shift. CT Head/Cspine negative.  Interim History / Subjective:  PCCM placed CT for large L pleural effusion Pleural studies pending, suspect malignant effusion Admitted to ICU in the setting of pressor need  Objective:  Blood pressure 93/71, pulse (!) 142, temperature 97.8 F (36.6 C), temperature source Oral, resp. rate (!) 25, SpO2 93 %.       No intake or output data in the 24 hours ending 10/23/21 1955 There were no vitals filed for this visit.  Physical Examination: General: Chronically ill-appearing reclining comfortably in bed in NAD. HEENT: [place]/AT, anicteric sclera, PERRL, moist mucous membranes. Neuro: Awake, oriented x 4. Responds to verbal stimuli. Following commands consistently. Moves all 4 extremities spontaneously. Strength 5/5 in all extremities.  CV: RRR, no m/g/r. PULM: Breathing even and unlabored on 3L [place]. Lung fields diminished but decreased effort in light of recently placed CT. GI: Soft, nontender, nondistended. Normoactive bowel sounds. Extremities: trace LE edema noted. Skin: Warm/dry.  Resolved Hospital Problem List:    Assessment & Plan:  Large left pleural effusion New LLL mass  CTA Chest 9/6 with large L pleural effusion noted with R mediastinal shift, occluded LLL bronchus, concern for poorly defined  LLL mass.  Negative for PE. with PCCM consulted 9/6 for CT placement for large L pleural effusion. - S/p CT placement - Continue suction to -20, limit drainage to 1.5L first few hours, 2.5L overnight - F/u pleural fluid studies - Consider Oncology consult  Afib with RVR - Continue amiodarone gtt - Cardiac  monitoring - AC ok, start with heparin gtt  Hypotension Dehydration in the setting of poor PO intake - IV fluid resuscitation - Goal MAP > 65 - Neosynephrine titrated to goal MAP, off at time of my eval - Consider Echo in am  Mechanical fall secondary to weakness CT Head/Cspine negative for AICA/fracture  Best Practice: (right click and "Reselect all SmartList Selections" daily)   Diet/type: Regular consistency (see orders) DVT prophylaxis: SCDs, SQH GI prophylaxis: N/A Lines: N/A Foley:  N/A Code Status:  DNR (gold form in chart dated 01/2020) Last date of multidisciplinary goals of care discussion [Pending]  Labs:  CBC: Recent Labs  Lab 10/23/21 1545  WBC 10.3  NEUTROABS 8.3*  HGB 13.6  HCT 43.4  MCV 89.9  PLT 967*   Basic Metabolic Panel: Recent Labs  Lab 10/23/21 1545  NA 140  K 3.7  CL 103  CO2 22  GLUCOSE 90  BUN 21  CREATININE 1.03*  CALCIUM 9.5   GFR: CrCl cannot be calculated (Unknown ideal weight.). Recent Labs  Lab 10/23/21 1545 10/23/21 1546  WBC 10.3  --   LATICACIDVEN  --  1.6   Liver Function Tests: Recent Labs  Lab 10/23/21 1545  AST 16  ALT 9  ALKPHOS 115  BILITOT 1.3*  PROT 7.0  7.1  ALBUMIN 3.2*   No results for input(s): "LIPASE", "AMYLASE" in the last 168 hours. No results for input(s): "AMMONIA" in the last 168 hours.  ABG    Component Value Date/Time   PHART 7.438 01/06/2015 1804   PCO2ART 32.2 (L) 01/06/2015 1804   PO2ART 75.4 (L) 01/06/2015 1804   HCO3 21.4 01/06/2015 1804   TCO2 18.9 01/06/2015 1804   ACIDBASEDEF 1.5 01/06/2015 1804   O2SAT 94.4 01/06/2015 1804    Coagulation Profile: No results for input(s): "INR", "PROTIME" in the last 168 hours.  Cardiac Enzymes: No results for input(s): "CKTOTAL", "CKMB", "CKMBINDEX", "TROPONINI" in the last 168 hours.  HbA1C: Hemoglobin A1C  Date/Time Value Ref Range Status  03/20/2016 12:00 AM 5.8  Final   Hgb A1c MFr Bld  Date/Time Value Ref Range Status   02/07/2020 10:21 PM 5.3 4.8 - 5.6 % Final    Comment:    (NOTE) Pre diabetes:          5.7%-6.4%  Diabetes:              >6.4%  Glycemic control for   <7.0% adults with diabetes   01/07/2015 06:02 AM 6.2 (H) 4.8 - 5.6 % Final    Comment:    (NOTE)         Pre-diabetes: 5.7 - 6.4         Diabetes: >6.4         Glycemic control for adults with diabetes: <7.0    CBG: No results for input(s): "GLUCAP" in the last 168 hours.  Review of Systems:   Breathing is improved with effusion drainage. Uncomfortable at chest tube insertion site, challenging deep breaths. No other complaints  Past Medical History:  She,  has a past medical history of Allergic rhinitis, Arthritis, Complication of anesthesia, COPD (chronic obstructive pulmonary disease) ([place]), Diabetes mellitus, Fibromuscular  dysplasia ([place]), Hyperlipidemia, Hypertension, PONV (postoperative nausea and vomiting), Pseudogout, and Sleep apnea.   Surgical History:   Past Surgical History:  Procedure Laterality Date   BACK SURGERY  2005   bilateral renal bypass     ESOPHAGOGASTRODUODENOSCOPY N/A 10/01/2012   Procedure: ESOPHAGOGASTRODUODENOSCOPY (EGD);  Surgeon: Missy Sabins, MD;  Location: [facility] ENDOSCOPY;  Service: Endoscopy;  Laterality: N/A;   nasal septal deviation     pituitary tumor removed     x 2   TONSILLECTOMY     Social History:   reports that she quit smoking about 24 years ago. Her smoking use included cigarettes. She has a 35.00 pack-year smoking history. She has never used smokeless tobacco. She reports that she does not drink alcohol and does not use drugs.   Family History:  Her family history includes Breast cancer in her mother; Stroke in her father.   Allergies: Allergies  Allergen Reactions   Valacyclovir Hcl Rash   Home Medications: Prior to Admission medications   Medication Sig Start Date End Date Taking? Authorizing Provider  acetaminophen (TYLENOL) 325 MG tablet Take 650 mg by mouth every 6  (six) hours as needed for headache. Geralyn Corwin MD if not relived 9 not to exceed '3000mg'$  in 24 hour period 02/13/20   [provider]  acetaminophen (TYLENOL) 500 MG tablet Take 1,000 mg by mouth 2 (two) times daily as needed.    [provider]  amLODipine (NORVASC) 10 MG tablet Take 1 tablet (10 mg total) by mouth daily. 02/24/20   Fargo, Amy E, NP  atorvastatin (LIPITOR) 80 MG tablet Take 1 tablet (80 mg total) by mouth daily. 02/24/20   Fargo, Amy E, NP  bisacodyl (DULCOLAX) 10 MG suppository Place 10 mg rectally as needed for moderate constipation.    [provider]  Ensure (ENSURE) Take 237 mLs by mouth daily. (prefers chocolate) d/t poor appetite. 02/09/20   [provider]  fexofenadine (ALLEGRA) 180 MG tablet Take 180 mg by mouth daily.    [provider]  gabapentin (NEURONTIN) 600 MG tablet Take 1 tablet (600 mg total) by mouth at bedtime. 02/24/20   Fargo, Amy E, NP  losartan (COZAAR) 50 MG tablet Take 1 tablet (50 mg total) by mouth daily. 02/24/20   Fargo, Amy E, NP  magnesium hydroxide (MILK OF MAGNESIA) 400 MG/5ML suspension Take by mouth daily as needed for mild constipation.    [provider]  polyvinyl alcohol (LIQUIFILM TEARS) 1.4 % ophthalmic solution Place 1 drop into both eyes as needed for dry eyes.    [provider]  Sodium Phosphates (RA SALINE ENEMA RE) Place rectally as needed.    [provider]    Critical care time: 45mn    JCorena PilgrimPulmonary & Critical Care 10/23/21 7:55 PM  Please see [email or web address] for pager details.  From 7A-7P if no response, please call [phone] After hours, please call ELink [phone]

## 2021-10-23 NOTE — ED Notes (Signed)
Dr. Matilde Sprang gave verbal order for LR bolus. RN started this.

## 2021-10-23 NOTE — Progress Notes (Addendum)
eLink Physician-Brief Progress Note Patient Name: [name] DOB: 25-Jun-1934 MRN: [id number]   Date of Service  10/23/2021  HPI/Events of Note  Patient admitted with fall, large left pleural effusion, and possible left lower lobe lung mass.Patient was turned and had 600 ml of sero-sanguinous chest tube output, no change in heart rate or blood pressure, patient remains alert and interactive.  eICU Interventions  New Patient Evaluation. Stat H & H, Type and Screen.        Kerry Kass Albana Saperstein 10/23/2021, 10:04 PM

## 2021-10-23 NOTE — Procedures (Signed)
Insertion of Chest Tube Procedure Note  [name]  [id number]  06/11/1934  Date:10/23/21  Time:7:04 PM    Provider Performing: Candee Furbish   Procedure: Chest Tube Insertion [phone])  Indication(s) Effusion  Consent Risks of the procedure as well as the alternatives and risks of each were explained to the patient and/or caregiver.  Consent for the procedure was obtained and is signed in the bedside chart  Anesthesia Topical only with 1% lidocaine    Time Out Verified patient identification, verified procedure, site/side was marked, verified correct patient position, special equipment/implants available, medications/allergies/relevant history reviewed, required imaging and test results available.   Sterile Technique Maximal sterile technique including full sterile barrier drape, hand hygiene, sterile gown, sterile gloves, mask, hair covering, sterile ultrasound probe cover (if used).   Procedure Description Ultrasound used to identify appropriate pleural anatomy for placement and overlying skin marked. Area of placement cleaned and draped in sterile fashion.  A 14 French pigtail pleural catheter was placed into the left pleural space using Seldinger technique. Appropriate return of fluid was obtained.  The tube was connected to atrium and placed on -20 cm H2O wall suction.   Complications/Tolerance None; patient tolerated the procedure well. Chest X-ray is ordered to verify placement.   EBL Minimal  Specimen(s) fluid

## 2021-10-23 NOTE — ED Triage Notes (Addendum)
Pt coming from [facility]- independent living with c/o fall. Pt reports hitting the back of her head getting out of the bathroom today. Denies blood thinners and LOC. Pt also reports decreased oral intake and generalized weakness since Friday as well.

## 2021-10-23 NOTE — Progress Notes (Signed)
If bps don't respond to fluids let 667 pager know.  My exam: ext warm irregular hr, large L effusion. Mentoring ok.

## 2021-10-23 NOTE — ED Provider Notes (Signed)
[facility]-ICU/STEPDOWN Provider Note  CSN: [id number] Arrival date & time: 10/23/21 1518  Chief Complaint(s) Fall  HPI [name] is a 87 y.o. female with PMH T2DM, pseudogout, HTN, HLD, COPD who presents emergency department for evaluation of a fall.  Patient states that she has been feeling weak today and after using the restroom had a mechanical fall in the hallway.  She did strike the back of her head.  No blood thinner use.  EMS stated the patient was hypotensive on scene but this corrected in the ambulance prior to arrival.  She currently denies chest pain, shortness of breath, abdominal pain, nausea, vomiting or other systemic symptoms.  Does endorse headache.   Past Medical History Past Medical History:  Diagnosis Date   Allergic rhinitis    Arthritis    Complication of anesthesia    Hard time waking up   COPD (chronic obstructive pulmonary disease) ([place])    PFT 08/08/09-FEV1 1.98/107; R 0.77; small airway obst w/tresp to dilator;DLCO 44%   Diabetes mellitus    Fibromuscular dysplasia (HCC)    RAS   Hyperlipidemia    Hypertension    PONV (postoperative nausea and vomiting)    Pseudogout    Sleep apnea    AHI 89/hr   Patient Active Problem List   Diagnosis Date Noted   A-fib ([place]) 10/23/2021   General weakness 02/07/2020   FTT (failure to thrive) in adult 02/07/2020   Dehydration 02/07/2020   Lactic acidosis 02/07/2020   Hypotension 02/07/2020   Dissection of aorta ([place]) 05/13/2016   Aortic valve stenosis 05/13/2016   Aortic atherosclerosis ([place]) 05/13/2016   Degenerative arthritis of cervical spine with nerve compression 04/20/2016   Neck pain 03/16/2016   Chest wall pain 03/16/2016   Hypokalemia 03/16/2016   Nausea & vomiting 03/16/2016   Polyarticular pseudogout 01/06/2015   Knee effusion    Pseudogout 02/14/2014   Knee effusion, left 05/17/2013   Fatigue 08/14/2011   Edema 03/19/2011   COPD (chronic obstructive pulmonary  disease) ([place]) 02/02/2011   Aortic valve disorder 02/27/2010   Type 2 diabetes mellitus with neurological complications ([place]) 35/36/1443   DYSPNEA 10/29/2009   ALLERGIC RHINITIS 12/24/2008   Hyperlipidemia 12/19/2008   Essential hypertension 12/19/2008   Obstructive sleep apnea 12/19/2008   Home Medication(s) Prior to Admission medications   Medication Sig Start Date End Date Taking? Authorizing Provider  acetaminophen (TYLENOL) 325 MG tablet Take 325-650 mg by mouth every 6 (six) hours as needed for headache or mild pain (CANNOT EXCEED A SUM TOTAL OF 3,000 MG/DAY- FROM ALL COMBINED SOURCES and phone MD, if no relief). 02/13/20  Yes [provider]  amLODipine (NORVASC) 10 MG tablet Take 1 tablet (10 mg total) by mouth daily. 02/24/20  Yes Fargo, Amy E, NP  atorvastatin (LIPITOR) 80 MG tablet Take 1 tablet (80 mg total) by mouth daily. 02/24/20  Yes Fargo, Amy E, NP  fexofenadine (ALLEGRA) 180 MG tablet Take 180 mg by mouth daily.   Yes [provider]  lactose free nutrition (BOOST) LIQD Take 237 mLs by mouth 3 (three) times daily between meals.   Yes [provider]  levothyroxine (SYNTHROID) 50 MCG tablet Take 50 mcg by mouth daily. 09/30/21  Yes [provider]  losartan (COZAAR) 50 MG tablet Take 1 tablet (50 mg total) by mouth daily. 02/24/20  Yes Fargo, Amy E, NP  polyvinyl alcohol (LIQUIFILM TEARS) 1.4 % ophthalmic solution Place 1 drop into both eyes as needed for dry  eyes.   Yes [provider]  bisacodyl (DULCOLAX) 10 MG suppository Place 10 mg rectally as needed for moderate constipation.    [provider]  gabapentin (NEURONTIN) 600 MG tablet Take 1 tablet (600 mg total) by mouth at bedtime. Patient not taking: Reported on 10/23/2021 02/24/20   Yvonna Alanis, NP  magnesium hydroxide (MILK OF MAGNESIA) 400 MG/5ML suspension Take by mouth daily as needed for mild constipation.    [provider]  Sodium Phosphates (RA SALINE ENEMA  RE) Place 1 enema rectally as needed.    [provider]                                                                                                                                    Past Surgical History Past Surgical History:  Procedure Laterality Date   BACK SURGERY  2005   bilateral renal bypass     ESOPHAGOGASTRODUODENOSCOPY N/A 10/01/2012   Procedure: ESOPHAGOGASTRODUODENOSCOPY (EGD);  Surgeon: Missy Sabins, MD;  Location: [facility] ENDOSCOPY;  Service: Endoscopy;  Laterality: N/A;   nasal septal deviation     pituitary tumor removed     x 2   TONSILLECTOMY     Family History Family History  Problem Relation Age of Onset   Stroke Father    Breast cancer Mother     Social History Social History   Tobacco Use   Smoking status: Former    Packs/day: 1.00    Years: 35.00    Total pack years: 35.00    Types: Cigarettes    Quit date: 02/17/1997    Years since quitting: 24.6   Smokeless tobacco: Never  Substance Use Topics   Alcohol use: No   Drug use: No   Allergies Valacyclovir hcl  Review of Systems Review of Systems  Constitutional:  Positive for fatigue.  Neurological:  Positive for syncope and headaches.    Physical Exam Vital Signs  I have reviewed the triage vital signs BP 131/73   Pulse (!) 118   Temp 97.8 F (36.6 C) (Oral)   Resp (!) 21   SpO2 95%   Physical Exam Vitals and nursing note reviewed.  Constitutional:      General: She is not in acute distress.    Appearance: She is well-developed. She is ill-appearing.  HENT:     Head: Normocephalic and atraumatic.  Eyes:     Conjunctiva/sclera: Conjunctivae normal.  Cardiovascular:     Rate and Rhythm: Tachycardia present. Rhythm irregular.     Heart sounds: No murmur heard. Pulmonary:     Effort: Pulmonary effort is normal. No respiratory distress.     Comments: Decreased breath sounds on the left Abdominal:     Palpations: Abdomen is soft.     Tenderness: There is no abdominal  tenderness.  Musculoskeletal:        General: No swelling.     Cervical back: Neck supple.  Skin:    General: Skin is warm and dry.     Capillary Refill: Capillary refill takes less than 2 seconds.  Neurological:     Mental Status: She is alert.  Psychiatric:        Mood and Affect: Mood normal.     ED Results and Treatments Labs (all labs ordered are listed, but only abnormal results are displayed) Labs Reviewed  COMPREHENSIVE METABOLIC PANEL - Abnormal; Notable for the following components:      Result Value   Creatinine, Ser 1.03 (*)    Albumin 3.2 (*)    Total Bilirubin 1.3 (*)    GFR, Estimated 53 (*)    All other components within normal limits  CBC WITH DIFFERENTIAL/PLATELET - Abnormal; Notable for the following components:   RDW 15.8 (*)    Platelets 572 (*)    Neutro Abs 8.3 (*)    Monocytes Absolute 1.1 (*)    Abs Immature Granulocytes 0.11 (*)    All other components within normal limits  LACTATE DEHYDROGENASE - Abnormal; Notable for the following components:   LDH 213 (*)    All other components within normal limits  LACTATE DEHYDROGENASE, PLEURAL OR PERITONEAL FLUID - Abnormal; Notable for the following components:   LD, Fluid 508 (*)    All other components within normal limits  BODY FLUID CELL COUNT WITH DIFFERENTIAL - Abnormal; Notable for the following components:   Color, Fluid AMBER (*)    Appearance, Fluid CLOUDY (*)    Total Nucleated Cell Count, Fluid 1,795 (*)    Neutrophil Count, Fluid 29 (*)    Monocyte-Macrophage-Serous Fluid 8 (*)    All other components within normal limits  RESP PANEL BY RT-PCR (FLU A&B, COVID) ARPGX2  BODY FLUID CULTURE W GRAM STAIN  MRSA NEXT GEN BY PCR, NASAL  LACTIC ACID, PLASMA  PROTEIN, TOTAL  PROTEIN, PLEURAL OR PERITONEAL FLUID  URINALYSIS, ROUTINE W REFLEX MICROSCOPIC  CBC  BASIC METABOLIC PANEL  MAGNESIUM  PHOSPHORUS  HEMOGLOBIN AND HEMATOCRIT, BLOOD  TYPE AND SCREEN  CYTOLOGY - NON PAP  TROPONIN I  (HIGH SENSITIVITY)                                                                                                                          Radiology DG Chest 1 View  Result Date: 10/23/2021 CLINICAL DATA:  [id number] EXAM: CHEST  1 VIEW COMPARISON:  Chest x-ray 10/23/2021 FINDINGS: The heart and mediastinal contours are grossly within normal limits with silhouetting off of the left ventricle due to lung disease. Atherosclerotic plaque. Interval placement of a left chest tube with pigtail overlying the mid to lower left lung zone. Slightly improved aeration of the left upper lobe. No pulmonary edema. Slight interval decrease in size of an at least moderate sized left pleural effusion. No pneumothorax. No acute osseous abnormality. Degenerative changes of the right shoulder. IMPRESSION: 1. Interval placement of a left chest tube in appropriate position with slight interval decrease  in size of an at least moderate volume left pleural effusion. 2.  Aortic Atherosclerosis (ICD10-I70.0). Electronically Signed   By: Iven Finn M.D.   On: 10/23/2021 19:34   CT Angio Chest PE W and/or Wo Contrast  Result Date: 10/23/2021 CLINICAL DATA:  History of fall abnormal chest x-ray EXAM: CT ANGIOGRAPHY CHEST WITH CONTRAST TECHNIQUE: Multidetector CT imaging of the chest was performed using the standard protocol during bolus administration of intravenous contrast. Multiplanar CT image reconstructions and MIPs were obtained to evaluate the vascular anatomy. RADIATION DOSE REDUCTION: This exam was performed according to the departmental dose-optimization program which includes automated exposure control, adjustment of the mA and/or kV according to patient size and/or use of iterative reconstruction technique. CONTRAST:  74m OMNIPAQUE IOHEXOL 350 MG/ML SOLN COMPARISON:  Chest x-ray 10/23/2021, 02/07/2020, CT chest 03/16/2016, CT 10/01/2012 FINDINGS: Cardiovascular: Satisfactory opacification of the pulmonary arteries to the  segmental level. No acute pulmonary embolism is visualized. Advanced aortic calcification. Poor contrast opacification of the aortic arch and descending thoracic aorta. Chronically displaced wall calcifications at the mid descending thoracic aorta, maximum aortic diameter of 3.6 cm at this level. Coronary vascular calcifications. Normal cardiac size. No pericardial effusion. Mediastinum/Nodes: Midline trachea. Heterogeneous enlarged left lobe of thyroid with multiple nodules, unchanged. Stability for greater than 5 years implies benignity; no biopsy or followup indicated (ref: J Am Coll Radiol. 2015 Feb;12(2): 143-50). Mildly enlarged right paratracheal node measuring 11 mm, series 7, image 53. Upper esophagus contains a small amount of debris. The distal esophagus is decompressed. There is ill-defined soft tissue density within the subcarinal region that is difficult to separate from the decompressed esophagus. This appears contiguous with soft tissue density surrounding the left lower lobe bronchus. Mass effect on the left atrium. Lungs/Pleura: Emphysema. Large left-sided pleural effusion with shift of mediastinal contents to the right. Atelectasis of most of the left lung. Heterogeneous hypodense areas within left lower lobe consolidative process, series 7, image 91 suggestive of necrosis. Left lower lobe consolidative process cannot be separated from the soft tissue abnormality surrounding the left lower lobe bronchus and extending into the mediastinum. Delayed images of the lower chest demonstrate a heterogenous enhancing mass abutting or arising from the left posterior pleural surface, this measures 7 by 3.6 cm, series 10, image 26. Suspicion of additional pleural based lesion posterior medially at the level of the distal arch, series 7 image 49. mild irregular narrowing of left upper lobe bronchus as well. Upper Abdomen: No acute process in the upper abdomen. Stable 1.4 cm left adrenal mass, likely  representing adenoma given long-term stability, no follow-up imaging is recommended. Chronic mild aneurysmal dilatation of the left renal artery. Musculoskeletal: No acute osseous abnormality Review of the MIP images confirms the above findings. IMPRESSION: 1. Negative for acute pulmonary embolus. 2. Large left-sided pleural effusion with shift of mediastinal contents to the right. Atelectasis of most of the left lung. Occluded left lower lobe bronchus. Poorly defined soft tissue density/suspected hilar/infrahilar and left lower lobe mass, poorly separated from ill-defined subcarinal soft tissue density. This exerts mass effect on the left atrium. Findings concerning for malignancy. Mass or consolidation in the left lower lobe demonstrates heterogenous hypodensities suggesting necrosis. Pulmonary consultation is recommended. 3. Additional finding of heterogenous enhancing left lower lobe mass measuring up to 7 cm either abutting or arising from the pleural surface, concerning for metastatic lesion. Suspicion of additional probable pleural based mass slightly more superior at the level of the distal arch. Critical Value/emergent results were  called by telephone at the time of interpretation on 10/23/2021 at 6:54 pm to provider Tierany Appleby [facility] , who verbally acknowledged these results. Aortic Atherosclerosis (ICD10-I70.0) and Emphysema (ICD10-J43.9). Electronically Signed   By: Donavan Foil M.D.   On: 10/23/2021 18:54   CT HEAD WO CONTRAST (5MM)  Result Date: 10/23/2021 CLINICAL DATA:  Fall EXAM: CT HEAD WITHOUT CONTRAST CT CERVICAL SPINE WITHOUT CONTRAST TECHNIQUE: Multidetector CT imaging of the head and cervical spine was performed following the standard protocol without intravenous contrast. Multiplanar CT image reconstructions of the cervical spine were also generated. RADIATION DOSE REDUCTION: This exam was performed according to the departmental dose-optimization program which includes automated exposure  control, adjustment of the mA and/or kV according to patient size and/or use of iterative reconstruction technique. COMPARISON:  Brain MRI 07/02/2017 and cervical spine CT 03/16/2016 FINDINGS: CT HEAD FINDINGS Brain: There is no acute intracranial hemorrhage, extra-axial fluid collection, or acute infarct. Background parenchymal volume is normal for age. The ventricles are normal in size. Gray-white differentiation is preserved. There is no mass lesion. There is no mass effect or midline shift. Vascular: There is calcification of the bilateral carotid siphons. Skull: Normal. Negative for fracture or focal lesion. Sinuses/Orbits: The imaged paranasal sinuses are clear. Bilateral lens implants are in place. The globes and orbits are otherwise unremarkable. Other: None. CT CERVICAL SPINE FINDINGS Alignment: Trace anterolisthesis of C3 on C4 and C4 on C5 and trace retrolisthesis of C5 on C6 is unchanged since 2018. There is no jumped or perched facet or other evidence of traumatic malalignment. Skull base and vertebrae: Skull base alignment is maintained. Vertebral body heights are preserved. There is no evidence of acute fracture. There is no suspicious osseous lesion. Soft tissues and spinal canal: No prevertebral fluid or swelling. No visible canal hematoma. Disc levels: Disc space narrowing and degenerative endplate change most advanced at C5-C6. Facet arthropathy is most advanced at C2-C3 bilaterally and on the left at C3-C4. Upper chest: The lungs are assessed on the separately dictated CTA chest. Other: None. IMPRESSION: 1. No acute intracranial pathology. 2. No acute fracture or traumatic malalignment of the cervical spine. Electronically Signed   By: Valetta Mole M.D.   On: 10/23/2021 18:28   CT Cervical Spine Wo Contrast  Result Date: 10/23/2021 CLINICAL DATA:  Fall EXAM: CT HEAD WITHOUT CONTRAST CT CERVICAL SPINE WITHOUT CONTRAST TECHNIQUE: Multidetector CT imaging of the head and cervical spine was  performed following the standard protocol without intravenous contrast. Multiplanar CT image reconstructions of the cervical spine were also generated. RADIATION DOSE REDUCTION: This exam was performed according to the departmental dose-optimization program which includes automated exposure control, adjustment of the mA and/or kV according to patient size and/or use of iterative reconstruction technique. COMPARISON:  Brain MRI 07/02/2017 and cervical spine CT 03/16/2016 FINDINGS: CT HEAD FINDINGS Brain: There is no acute intracranial hemorrhage, extra-axial fluid collection, or acute infarct. Background parenchymal volume is normal for age. The ventricles are normal in size. Gray-white differentiation is preserved. There is no mass lesion. There is no mass effect or midline shift. Vascular: There is calcification of the bilateral carotid siphons. Skull: Normal. Negative for fracture or focal lesion. Sinuses/Orbits: The imaged paranasal sinuses are clear. Bilateral lens implants are in place. The globes and orbits are otherwise unremarkable. Other: None. CT CERVICAL SPINE FINDINGS Alignment: Trace anterolisthesis of C3 on C4 and C4 on C5 and trace retrolisthesis of C5 on C6 is unchanged since 2018. There is no jumped or perched facet  or other evidence of traumatic malalignment. Skull base and vertebrae: Skull base alignment is maintained. Vertebral body heights are preserved. There is no evidence of acute fracture. There is no suspicious osseous lesion. Soft tissues and spinal canal: No prevertebral fluid or swelling. No visible canal hematoma. Disc levels: Disc space narrowing and degenerative endplate change most advanced at C5-C6. Facet arthropathy is most advanced at C2-C3 bilaterally and on the left at C3-C4. Upper chest: The lungs are assessed on the separately dictated CTA chest. Other: None. IMPRESSION: 1. No acute intracranial pathology. 2. No acute fracture or traumatic malalignment of the cervical spine.  Electronically Signed   By: Valetta Mole M.D.   On: 10/23/2021 18:28   DG Chest 2 View  Result Date: 10/23/2021 CLINICAL DATA:  Trauma, fall, difficulty breathing EXAM: CHEST - 2 VIEW COMPARISON:  02/07/2020 FINDINGS: Transplant diameter heart is increased. There is almost complete opacification of left hemithorax. Findings suggest large left pleural effusion. Evaluation of right lung for infiltrates is limited by the large effusion. In view of history of fall, possibility of hemothorax is not excluded. Right lung is clear. Right lateral CP angle is clear. There is no pneumothorax. There is evidence of previous vertebroplasty in lower thoracic spine. Degenerative changes are noted in both shoulders. IMPRESSION: There is interval appearance of large left pleural effusion. This may suggest parapneumonic effusion or hemothorax or neoplastic process. Follow-up CT chest may be considered. Electronically Signed   By: Elmer Picker M.D.   On: 10/23/2021 16:12    Pertinent labs & imaging results that were available during my care of the patient were reviewed by me and considered in my medical decision making (see MDM for details).  Medications Ordered in ED Medications  amiodarone (NEXTERONE) 1.8 mg/mL load via infusion 150 mg (has no administration in time range)    Followed by  amiodarone (NEXTERONE PREMIX) 360-4.14 MG/200ML-% (1.8 mg/mL) IV infusion (has no administration in time range)    Followed by  amiodarone (NEXTERONE PREMIX) 360-4.14 MG/200ML-% (1.8 mg/mL) IV infusion (has no administration in time range)  sodium chloride flush (NS) 0.9 % injection 10 mL (has no administration in time range)  0.9 %  sodium chloride infusion (has no administration in time range)  0.9 %  sodium chloride infusion (250 mLs Intravenous New Bag/Given 10/23/21 2013)  phenylephrine (NEO-SYNEPHRINE) 20 mg in sodium chloride 0.9 % 250 mL (0.08 mg/mL) infusion (25 mcg/min Intravenous New Bag/Given 10/23/21 1954)  docusate  sodium (COLACE) capsule 100 mg (has no administration in time range)  polyethylene glycol (MIRALAX / GLYCOLAX) packet 17 g (has no administration in time range)  heparin injection 5,000 Units (has no administration in time range)  Chlorhexidine Gluconate Cloth 2 % PADS 6 each (has no administration in time range)  lactated ringers bolus 1,000 mL (0 mLs Intravenous Stopped 10/23/21 1850)  iohexol (OMNIPAQUE) 350 MG/ML injection 75 mL (75 mLs Intravenous Contrast Given 10/23/21 1749)  fentaNYL (SUBLIMAZE) injection 100 mcg (100 mcg Intravenous Given 10/23/21 1904)  Procedures .Critical Care  Performed by: Teressa Lower, MD Authorized by: Teressa Lower, MD   Critical care provider statement:    Critical care time (minutes):  75   Critical care was necessary to treat or prevent imminent or life-threatening deterioration of the following conditions:  Cardiac failure and respiratory failure   Critical care was time spent personally by me on the following activities:  Development of treatment plan with patient or surrogate, discussions with consultants, evaluation of patient's response to treatment, examination of patient, ordering and review of laboratory studies, ordering and review of radiographic studies, ordering and performing treatments and interventions, pulse oximetry, re-evaluation of patient's condition and review of old charts   (including critical care time)  Medical Decision Making / ED Course   This patient presents to the ED for concern of fall, shortness of breath, this involves an extensive number of treatment options, and is a complaint that carries with it a high risk of complications and morbidity.  The differential diagnosis includes electrolyte abnormality, pneumonia, pleural effusion, viral illness  MDM: Seen emergency room for evaluation of  multiple complaints described above.  Physical exam reveals an ill-appearing tachycardic and tachypneic patient with decreased breath sounds on left.  Patient placed on oxygen for comfort and tachypnea.  Laboratory evaluation with no significant leukocytosis, COVID and flu negative, lactic acid normal.  Chest x-ray concerning for a new large pleural effusion on the left.  CT head and C-spine negative for traumatic injury.  CT PE with large left-sided pleural effusion and a consolidative mass on the left with additional metastatic lesions.  Immediately upon return from the CT scanner, patient flipped into A-fib with RVR and started to become hypotensive.  Patient fluid resuscitated and ultimately required initiation of a Neo drip and started amiodarone for her A-fib with RVR given unknown source of her pleural effusion and possibility of underlying heart failure.  Pulmonology was consulted who came to bedside and placed chest tube with drainage of over 1400 cc of straw-colored fluid.  Patient then admitted to the ICU.   Additional history obtained:  -External records from outside source obtained and reviewed including: Chart review including previous notes, labs, imaging, consultation notes   Lab Tests: -I ordered, reviewed, and interpreted labs.   The pertinent results include:   Labs Reviewed  COMPREHENSIVE METABOLIC PANEL - Abnormal; Notable for the following components:      Result Value   Creatinine, Ser 1.03 (*)    Albumin 3.2 (*)    Total Bilirubin 1.3 (*)    GFR, Estimated 53 (*)    All other components within normal limits  CBC WITH DIFFERENTIAL/PLATELET - Abnormal; Notable for the following components:   RDW 15.8 (*)    Platelets 572 (*)    Neutro Abs 8.3 (*)    Monocytes Absolute 1.1 (*)    Abs Immature Granulocytes 0.11 (*)    All other components within normal limits  LACTATE DEHYDROGENASE - Abnormal; Notable for the following components:   LDH 213 (*)    All other components  within normal limits  LACTATE DEHYDROGENASE, PLEURAL OR PERITONEAL FLUID - Abnormal; Notable for the following components:   LD, Fluid 508 (*)    All other components within normal limits  BODY FLUID CELL COUNT WITH DIFFERENTIAL - Abnormal; Notable for the following components:   Color, Fluid AMBER (*)    Appearance, Fluid CLOUDY (*)    Total Nucleated Cell Count, Fluid 1,795 (*)    Neutrophil Count, Fluid  29 (*)    Monocyte-Macrophage-Serous Fluid 8 (*)    All other components within normal limits  RESP PANEL BY RT-PCR (FLU A&B, COVID) ARPGX2  BODY FLUID CULTURE W GRAM STAIN  MRSA NEXT GEN BY PCR, NASAL  LACTIC ACID, PLASMA  PROTEIN, TOTAL  PROTEIN, PLEURAL OR PERITONEAL FLUID  URINALYSIS, ROUTINE W REFLEX MICROSCOPIC  CBC  BASIC METABOLIC PANEL  MAGNESIUM  PHOSPHORUS  HEMOGLOBIN AND HEMATOCRIT, BLOOD  TYPE AND SCREEN  CYTOLOGY - NON PAP  TROPONIN I (HIGH SENSITIVITY)      EKG   EKG Interpretation  Date/Time:  Wednesday October 23 2021 18:29:41 EDT Ventricular Rate:  148 PR Interval:    QRS Duration: 73 QT Interval:  300 QTC Calculation: 471 R Axis:   -11 Text Interpretation: Atrial fibrillation with rapid V-rate Confirmed by [place] ([id number]) on 10/23/2021 10:09:59 PM         Imaging Studies ordered: I ordered imaging studies including chest x-ray, CT head, C-spine, CT PE I independently visualized and interpreted imaging. I agree with the radiologist interpretation   Medicines ordered and prescription drug management: Meds ordered this encounter  Medications   lactated ringers bolus 1,000 mL   iohexol (OMNIPAQUE) 350 MG/ML injection 75 mL   FOLLOWED BY Linked Order Group    amiodarone (NEXTERONE) 1.8 mg/mL load via infusion 150 mg    amiodarone (NEXTERONE PREMIX) 360-4.14 MG/200ML-% (1.8 mg/mL) IV infusion    amiodarone (NEXTERONE PREMIX) 360-4.14 MG/200ML-% (1.8 mg/mL) IV infusion   fentaNYL (SUBLIMAZE) 50 MCG/ML injection    Lynford Humphrey A:  cabinet override   DISCONTD: fentaNYL (SUBLIMAZE) injection 100 mcg   fentaNYL (SUBLIMAZE) injection 100 mcg   sodium chloride flush (NS) 0.9 % injection 10 mL   DISCONTD: phenylephrine (NEO-SYNEPHRINE) 20 mg in sodium chloride 0.9 % 250 mL (0.08 mg/mL) infusion   0.9 %  sodium chloride infusion   DISCONTD: phenylephrine (NEO-SYNEPHRINE) 20 mg in sodium chloride 0.9 % 250 mL (0.08 mg/mL) infusion    Order Specific Question:   IV Access    Answer:   Peripheral   0.9 %  sodium chloride infusion   phenylephrine (NEO-SYNEPHRINE) 20 mg in sodium chloride 0.9 % 250 mL (0.08 mg/mL) infusion    Order Specific Question:   IV Access    Answer:   Peripheral   DISCONTD: phenylephrine 80 mcg/10 mL injection    Laddie Aquas M: cabinet override   docusate sodium (COLACE) capsule 100 mg   polyethylene glycol (MIRALAX / GLYCOLAX) packet 17 g   heparin injection 5,000 Units   Chlorhexidine Gluconate Cloth 2 % PADS 6 each    -I have reviewed the patients home medicines and have made adjustments as needed  Critical interventions Amiodarone, pressor support, pulmonology consultation  Consultations Obtained: I requested consultation with the pulmonologist Erskine Emery,  and discussed lab and imaging findings as well as pertinent plan - they recommend: Chest tube placement   Cardiac Monitoring: The patient was maintained on a cardiac monitor.  I personally viewed and interpreted the cardiac monitored which showed an underlying rhythm of: A-fib with RVR  Social Determinants of Health:  Factors impacting patients care include: none   Reevaluation: After the interventions noted above, I reevaluated the patient and found that they have :improved  Co morbidities that complicate the patient evaluation  Past Medical History:  Diagnosis Date   Allergic rhinitis    Arthritis    Complication of anesthesia    Hard time waking up   COPD (  chronic obstructive pulmonary disease) (HCC)    PFT 08/08/09-FEV1  1.98/107; R 0.77; small airway obst w/tresp to dilator;DLCO 44%   Diabetes mellitus    Fibromuscular dysplasia (HCC)    RAS   Hyperlipidemia    Hypertension    PONV (postoperative nausea and vomiting)    Pseudogout    Sleep apnea    AHI 89/hr      Dispostion: I considered admission for this patient, and given need for pressor support to maintain appropriate blood pressures and possible new cancer, patient require hospital admission     Final Clinical Impression(s) / ED Diagnoses Final diagnoses:  None     '@PCDICTATION'$ @    Teressa Lower, MD 10/23/21 2222

## 2021-10-23 NOTE — Progress Notes (Signed)
Full consult note tomorrow. Okay for [facility] from my standpoint. Pigtail for -20, try to limit to 1.5L drainage in first couple hours then 2.5 through night.  Send usual pleural studies.  Erskine Emery MD PCCM

## 2021-10-23 NOTE — Progress Notes (Signed)
An USGPIV (ultrasound guided PIV) has been placed for short-term vasopressor infusion. A correctly placed ivWatch must be used when administering Vasopressors. Should this treatment be needed beyond 72 hours, central line access should be obtained.  It will be the responsibility of the bedside nurse to follow best practice to prevent extravasations.   ?

## 2021-10-24 ENCOUNTER — Inpatient Hospital Stay (HOSPITAL_COMMUNITY): Payer: Medicare PPO

## 2021-10-24 DIAGNOSIS — J90 Pleural effusion, not elsewhere classified: Secondary | ICD-10-CM | POA: Diagnosis not present

## 2021-10-24 DIAGNOSIS — I480 Paroxysmal atrial fibrillation: Secondary | ICD-10-CM

## 2021-10-24 LAB — BASIC METABOLIC PANEL
Anion gap: 14 (ref 5–15)
BUN: 19 mg/dL (ref 8–23)
CO2: 20 mmol/L — ABNORMAL LOW (ref 22–32)
Calcium: 8.8 mg/dL — ABNORMAL LOW (ref 8.9–10.3)
Chloride: 104 mmol/L (ref 98–111)
Creatinine, Ser: 0.89 mg/dL (ref 0.44–1.00)
GFR, Estimated: >60 mL/min (ref >60)
Glucose, Bld: 105 mg/dL — ABNORMAL HIGH (ref 70–99)
Potassium: 3.9 mmol/L (ref 3.5–5.1)
Sodium: 138 mmol/L (ref 135–145)

## 2021-10-24 LAB — TSH: TSH: 0.034 u[IU]/mL — ABNORMAL LOW (ref 0.350–4.500)

## 2021-10-24 LAB — CBC
HCT: 40.6 % (ref 36.0–46.0)
Hemoglobin: 12.4 g/dL (ref 12.0–15.0)
MCH: 28.2 pg (ref 26.0–34.0)
MCHC: 30.5 g/dL (ref 30.0–36.0)
MCV: 92.3 fL (ref 80.0–100.0)
Platelets: 479 10*3/uL — ABNORMAL HIGH (ref 150–400)
RBC: 4.4 MIL/uL (ref 3.87–5.11)
RDW: 15.7 % — ABNORMAL HIGH (ref 11.5–15.5)
WBC: 9.6 10*3/uL (ref 4.0–10.5)
nRBC: 0 % (ref 0.0–0.2)

## 2021-10-24 LAB — URINALYSIS, ROUTINE W REFLEX MICROSCOPIC
Bilirubin Urine: NEGATIVE
Glucose, UA: NEGATIVE mg/dL
Hgb urine dipstick: NEGATIVE
Ketones, ur: 20 mg/dL — AB
Nitrite: NEGATIVE
Protein, ur: NEGATIVE mg/dL
Specific Gravity, Urine: >1.046 — ABNORMAL HIGH (ref 1.005–1.030)
pH: 5 (ref 5.0–8.0)

## 2021-10-24 LAB — MAGNESIUM: Magnesium: 1.6 mg/dL — ABNORMAL LOW (ref 1.7–2.4)

## 2021-10-24 LAB — TYPE AND SCREEN
ABO/RH(D): O POS
Antibody Screen: POSITIVE

## 2021-10-24 LAB — PHOSPHORUS: Phosphorus: 3.5 mg/dL (ref 2.5–4.6)

## 2021-10-24 MED ORDER — ENOXAPARIN SODIUM 60 MG/0.6ML IJ SOSY
1.0000 mg/kg | PREFILLED_SYRINGE | Freq: Two times a day (BID) | INTRAMUSCULAR | Status: DC
  Administered 2021-10-24: 60 mg via SUBCUTANEOUS
  Filled 2021-10-24: qty 0.6

## 2021-10-24 MED ORDER — SODIUM CHLORIDE 0.9% FLUSH
10.0000 mL | Freq: Three times a day (TID) | INTRAVENOUS | Status: DC
  Administered 2021-10-24 – 2021-11-02 (×24): 10 mL via INTRAPLEURAL

## 2021-10-24 MED ORDER — MAGNESIUM SULFATE 4 GM/100ML IV SOLN
4.0000 g | Freq: Once | INTRAVENOUS | Status: AC
Start: 2021-10-24 — End: 2021-10-24
  Administered 2021-10-24: 4 g via INTRAVENOUS
  Filled 2021-10-24: qty 100

## 2021-10-24 MED ORDER — ENOXAPARIN SODIUM 40 MG/0.4ML IJ SOSY
40.0000 mg | PREFILLED_SYRINGE | INTRAMUSCULAR | Status: DC
Start: 2021-10-25 — End: 2021-11-03
  Administered 2021-10-25 – 2021-11-03 (×10): 40 mg via SUBCUTANEOUS
  Filled 2021-10-24 (×10): qty 0.4

## 2021-10-24 MED ORDER — STERILE WATER FOR INJECTION IJ SOLN
5.0000 mg | Freq: Once | INTRAMUSCULAR | Status: AC
  Administered 2021-10-24: 5 mg via INTRAPLEURAL
  Filled 2021-10-24: qty 5

## 2021-10-24 MED ORDER — ORAL CARE MOUTH RINSE: 15.0000 mL | OROMUCOSAL | Status: DC | PRN

## 2021-10-24 MED ORDER — ACETAMINOPHEN 325 MG PO TABS
650.0000 mg | ORAL_TABLET | ORAL | Status: AC | PRN
  Administered 2021-10-24 – 2021-10-28 (×5): 650 mg via ORAL
  Filled 2021-10-24 (×5): qty 2

## 2021-10-24 MED ORDER — SODIUM CHLORIDE (PF) 0.9 % IJ SOLN
10.0000 mg | Freq: Once | INTRAMUSCULAR | Status: AC
  Administered 2021-10-24: 10 mg via INTRAPLEURAL
  Filled 2021-10-24: qty 10

## 2021-10-24 MED ORDER — LIP MEDEX EX OINT
TOPICAL_OINTMENT | CUTANEOUS | Status: DC | PRN
  Administered 2021-10-25: 1 via TOPICAL
  Filled 2021-10-24: qty 7

## 2021-10-24 MED ORDER — CALCIUM CARBONATE ANTACID 500 MG PO CHEW
1.0000 | CHEWABLE_TABLET | Freq: Three times a day (TID) | ORAL | Status: DC | PRN
  Administered 2021-10-24 – 2021-10-25 (×2): 200 mg via ORAL
  Filled 2021-10-24 (×2): qty 1

## 2021-10-24 NOTE — Progress Notes (Signed)
[place] Progress Note Patient Name: ETOILE Castillo DOB: 03/21/1934 MRN: [id number]   Date of Service  10/24/2021  HPI/Events of Note  Patient requesting PRN analgesic order for chest tube site pain, she is also requesting a diet.  eICU Interventions  PRN Tylenol (PO) and a Healthy Heart diet ordered.        Kerry Kass Jason Frisbee 10/24/2021, 12:40 AM

## 2021-10-24 NOTE — TOC Initial Note (Signed)
Transition of Care [facility]) - Initial/Assessment Note    Patient Details  Name: [name] MRN: [id number] Date of Birth: Jun 25, 1934  Transition of Care [facility]) CM/SW Contact:    Leeroy Cha, RN Phone Number: 10/24/2021, 7:38 AM  Clinical Narrative:                   Transition of Care [facility]) Screening Note   Patient Details  Name: [name] Date of Birth: Aug 09, 1934   Transition of Care [facility]) CM/SW Contact:    Leeroy Cha, RN Phone Number: 10/24/2021, 7:39 AM    Transition of Care Department [facility]) has reviewed patient and no TOC needs have been identified at this time. We will continue to monitor patient advancement through interdisciplinary progression rounds. If new patient transition needs arise, please place a TOC consult.   Expected Discharge Plan: Home/Self Care Barriers to Discharge: Continued Medical Work up   Patient Goals and CMS Choice Patient states their goals for this hospitalization and ongoing recovery are:: to go home CMS Medicare.gov Compare Post Acute Care list provided to:: Patient    Expected Discharge Plan and Services Expected Discharge Plan: Home/Self Care   Discharge Planning Services: CM Consult   Living arrangements for the past 2 months: Apartment                                      Prior Living Arrangements/Services Living arrangements for the past 2 months: Apartment Lives with:: Self Patient language and need for interpreter reviewed:: Yes Do you feel safe going back to the place where you live?: Yes            Criminal Activity/Legal Involvement Pertinent to Current Situation/Hospitalization: No - Comment as needed  Activities of Daily Living Home Assistive Devices/Equipment: Environmental consultant (specify type) [employer]) ADL Screening (condition at time of admission) Patient's cognitive ability adequate to safely complete daily activities?: Yes Is the patient deaf or have difficulty hearing?:  No Does the patient have difficulty seeing, even when wearing glasses/contacts?: No Does the patient have difficulty concentrating, remembering, or making decisions?: No Patient able to express need for assistance with ADLs?: Yes Does the patient have difficulty dressing or bathing?: Yes Independently performs ADLs?: No Communication: Independent Dressing (OT): Needs assistance Is this a change from baseline?: Pre-admission baseline Grooming: Independent Feeding: Independent Bathing: Independent Toileting: Independent In/Out Bed: Independent with device (comment) Walks in Home: Independent with device (comment) Wellsite geologist) Does the patient have difficulty walking or climbing stairs?: Yes Weakness of Legs: Both Weakness of Arms/Hands: Both  Permission Sought/Granted                  Emotional Assessment Appearance:: Appears stated age Attitude/Demeanor/Rapport: Engaged Affect (typically observed): Calm Orientation: : Oriented to Self, Oriented to Place, Oriented to  Time, Oriented to Situation Alcohol / Substance Use: Not Applicable Psych Involvement: No (comment)  Admission diagnosis:  A-fib [facility]) [I48.91] Patient Active Problem List   Diagnosis Date Noted   Pleural effusion    A-fib ([place]) 10/23/2021   General weakness 02/07/2020   FTT (failure to thrive) in adult 02/07/2020   Dehydration 02/07/2020   Lactic acidosis 02/07/2020   Hypotension 02/07/2020   Dissection of aorta ([place]) 05/13/2016   Aortic valve stenosis 05/13/2016   Aortic atherosclerosis ([place]) 05/13/2016   Degenerative arthritis of cervical spine with nerve compression 04/20/2016   Neck pain  03/16/2016   Chest wall pain 03/16/2016   Hypokalemia 03/16/2016   Nausea & vomiting 03/16/2016   Polyarticular pseudogout 01/06/2015   Knee effusion    Pseudogout 02/14/2014   Knee effusion, left 05/17/2013   Fatigue 08/14/2011   Edema 03/19/2011   COPD (chronic obstructive pulmonary disease) ([place])  02/02/2011   Aortic valve disorder 02/27/2010   Type 2 diabetes mellitus with neurological complications ([place]) 93/71/6967   DYSPNEA 10/29/2009   ALLERGIC RHINITIS 12/24/2008   Hyperlipidemia 12/19/2008   Essential hypertension 12/19/2008   Obstructive sleep apnea 12/19/2008   PCP:  [name], MD Pharmacy:   [facility] [address], [place] - [address] [place] [address] Phone: [phone] Fax: [phone]  [facility] - [place], [place] - [address] AT [address] [place] [address] Phone: [phone] Fax: [phone]  [place], [place] [address] Phone: [phone] Fax: [phone]     Social Determinants of Health (SDOH) Interventions    Readmission Risk Interventions   Row Labels 02/08/2020    1:57 PM  Readmission Risk Prevention Plan   Section Header. No data exists in this row.   Post Dischage Appt   Complete  Medication Screening   Complete  Transportation Screening   Complete

## 2021-10-24 NOTE — Progress Notes (Signed)
ANTICOAGULATION CONSULT NOTE - Initial Consult  Pharmacy Consult for Enoxaparin Indication: atrial fibrillation  Allergies  Allergen Reactions   Valacyclovir Hcl Rash    Patient Measurements: Weight: 59.7 kg (131 lb 9.8 oz) Heparin Dosing Weight:   Vital Signs: Temp: 98 F (36.7 C) (09/07 0400) Temp Source: Oral (09/07 0400) BP: 142/70 (09/07 0800) Pulse Rate: 89 (09/07 0800)  Labs: Recent Labs    10/23/21 1545 10/24/21 0008  HGB 13.6 12.4  HCT 43.4 40.6  PLT 572* 479*  CREATININE 1.03* 0.89  TROPONINIHS 9  --     CrCl cannot be calculated (Unknown ideal weight.).   Medical History: Past Medical History:  Diagnosis Date   Allergic rhinitis    Arthritis    Complication of anesthesia    Hard time waking up   COPD (chronic obstructive pulmonary disease) (HCC)    PFT 08/08/09-FEV1 1.98/107; R 0.77; small airway obst w/tresp to dilator;DLCO 44%   Diabetes mellitus    Fibromuscular dysplasia (HCC)    RAS   Hyperlipidemia    Hypertension    PONV (postoperative nausea and vomiting)    Pseudogout    Sleep apnea    AHI 89/hr    Medications:  Scheduled:   Chlorhexidine Gluconate Cloth  6 each Topical Daily   sodium chloride flush  10 mL Intrapleural Q8H   Infusions:   sodium chloride 20 mL/hr at 10/24/21 0700   sodium chloride 20 mL/hr at 10/24/21 0700   amiodarone 30 mg/hr (10/24/21 0924)    Assessment: 64 yoF presented to ED after fall, noted to have large pleural effusion, and Afib with RVR.  She converted to NSR on Amiodarone infusion.  Pharmacy is consulted to dose Enoxaparin while awaiting cardiology recommendations for chronic anticoagulation.   SCr 0.89 CBC: Hgb WNL, Plt elevated  Goal of Therapy:  Anti-Xa level 0.6-1 units/ml 4hrs after LMWH dose given Monitor platelets by anticoagulation protocol: Yes   Plan:  Enoxaparin 1 mg/kg ('60mg'$ ) SQ q12h SCr and CBC at least q72h Follow up long-term anticoagulation plans.  Gretta Arab PharmD,  BCPS Clinical Pharmacist [facility] main pharmacy [phone] 10/24/2021 9:47 AM

## 2021-10-24 NOTE — Progress Notes (Signed)
eLink Physician-Brief Progress Note Patient Name: [name] DOB: April 22, 1934 MRN: [id number]   Date of Service  10/24/2021  HPI/Events of Note  Patient request for TUMS.  eICU Interventions  Plan: TUMS 1 tablet PO Q 8 hours PRN indigestion or heartburn.      Intervention Category Major Interventions: Other:  Lysle Dingwall 10/24/2021, 10:09 PM

## 2021-10-24 NOTE — Procedures (Signed)
Pleural Fibrinolytic Administration Procedure Note  BAYLI QUESINBERRY  [id number]  09-11-1934  Date:10/24/21  Time:2:08 PM   Provider Performing:Dennys Guin Shearon Stalls   Procedure: Pleural Fibrinolysis Initial day [phone])  Indication(s) Fibrinolysis of complicated pleural effusion  Consent Risks of the procedure as well as the alternatives and risks of each were explained to the patient and/or caregiver.  Consent for the procedure was obtained.   Anesthesia None   Time Out Verified patient identification, verified procedure, site/side was marked, verified correct patient position, special equipment/implants available, medications/allergies/relevant history reviewed, required imaging and test results available.   Sterile Technique Hand hygiene, gloves   Procedure Description Existing pleural catheter was cleaned and accessed in sterile manner.  '10mg'$  of tPA in 30cc of saline and '5mg'$  of dornase in 30cc of sterile water were injected into pleural space using existing pleural catheter.  Catheter will be clamped for 1 hour and then placed back to suction.   Complications/Tolerance None; patient tolerated the procedure well.  EBL None   Specimen(s) None   Montey Hora, PA - [facility] For pager details, please see AMION or use Epic chat  After 1900, please call [place] for cross coverage needs 10/24/2021, 2:08 PM

## 2021-10-24 NOTE — Evaluation (Signed)
Physical Therapy Evaluation Patient Details Name: [name] MRN: [id number] DOB: 28-Aug-1934 Today's Date: 10/24/2021  History of Present Illness  HPI  [name] is a 87 y.o. female with PMH T2DM, pseudogout, HTN, HLD, COPD who presents emergency department 10/23/21 for evaluation of a fall,Chest x-ray concerning for a new large pleural effusion on the left.  CT head and C-spine negative for traumatic injury.  CT PE with large left-sided pleural effusion and a consolidative mass on the left with additional metastatic lesions.  Immediately upon return from the CT scanner, patient flipped into A-fib with RVR and started to become hypotensive. Chest tube placed  with drainage of over 1400 cc of straw-colored fluid.  Patient then admitted to the ICU.  Clinical Impression   The patient  admitted for above problems. Patient was limited to sitting on bed edge  for < 1 minute, reporting Left side pain at CT site.  Patient  remained on 3 L   at 95%, with brief period when O2 was disconnected and desaturated to 85%. Replaced to 3 L with return to > 92 %.  Patient lives alone at [facility], uses a rollator to ambulate to dining room. Pt admitted with above diagnosis.  Pt currently with functional limitations due to the deficits listed below (see PT Problem List). Pt will benefit from skilled PT to increase their independence and safety with mobility to allow discharge to the venue listed below.         Recommendations for follow up therapy are one component of a multi-disciplinary discharge planning process, led by the attending physician.  Recommendations may be updated based on patient status, additional functional criteria and insurance authorization.  Follow Up Recommendations Skilled nursing-short term rehab (<3 hours/day) Can patient physically be transported by private vehicle: No    Assistance Recommended at Discharge    Patient can return home with the following  A little  help with walking and/or transfers;A little help with bathing/dressing/bathroom;Assistance with cooking/housework;Assist for transportation    Equipment Recommendations None recommended by PT  Recommendations for Other Services  OT consult    Functional Status Assessment Patient has had a recent decline in their functional status and demonstrates the ability to make significant improvements in function in a reasonable and predictable amount of time.     Precautions / Restrictions Precautions Precaution Comments: L chest tube, monitor sats      Mobility  Bed Mobility Overal bed mobility: Needs Assistance Bed Mobility: Supine to Sit, Sit to Supine     Supine to sit: Mod assist Sit to supine: Min assist   General bed mobility comments: extra time, moved in small increments,  mod assist to raise trunk. patient sat on bed edge for less than 1 minute and began to return self to  lie  down.    Transfers                   General transfer comment: NT    Ambulation/Gait                  Stairs            Wheelchair Mobility    Modified Rankin (Stroke Patients Only)       Balance Overall balance assessment: Needs assistance, History of Falls Sitting-balance support: Bilateral upper extremity supported, Feet unsupported Sitting balance-Leahy Scale: Poor Sitting balance - Comments: limited, leaning forward, stating" i can't stay  up."  Pertinent Vitals/Pain Pain Assessment Pain Assessment: Faces Faces Pain Scale: Hurts whole lot Pain Location: Left ribs when mobving Pain Descriptors / Indicators: Discomfort, Moaning, Jabbing Pain Intervention(s): Monitored during session, Limited activity within patient's tolerance, Repositioned    Home Living Family/patient expects to be discharged to:: Private residence Living Arrangements: Alone Available Help at Discharge: Available  PRN/intermittently;Family Type of Home: Apartment Home Access: Level entry       Home Layout: One level Home Equipment: Rollator (4 wheels);Grab bars - tub/shower;Hand held shower head;Shower seat - built in      Prior Function Prior Level of Function : Independent/Modified Independent             Mobility Comments: uses rollator, ambulates to diring room at [employer], does not drive ADLs Comments: IADL's     Hand Dominance   Dominant Hand: Right    Extremity/Trunk Assessment   Upper Extremity Assessment Upper Extremity Assessment: LUE deficits/detail LUE Deficits / Details: limited shouldr elevation with CT/pain    Lower Extremity Assessment Lower Extremity Assessment: Generalized weakness    Cervical / Trunk Assessment Cervical / Trunk Assessment: Normal  Communication   Communication: No difficulties  Cognition Arousal/Alertness: Awake/alert Behavior During Therapy: WFL for tasks assessed/performed, Anxious Overall Cognitive Status: Within Functional Limits for tasks assessed                                          General Comments      Exercises     Assessment/Plan    PT Assessment Patient needs continued PT services  PT Problem List Decreased strength;Decreased mobility;Decreased safety awareness;Decreased knowledge of precautions;Decreased activity tolerance;Cardiopulmonary status limiting activity;Decreased knowledge of use of DME;Pain       PT Treatment Interventions DME instruction;Therapeutic activities;Gait training;Therapeutic exercise;Patient/family education;Functional mobility training    PT Goals (Current goals can be found in the Care Plan section)  Acute Rehab PT Goals Patient Stated Goal: get back  home PT Goal Formulation: With patient Time For Goal Achievement: 11/07/21 Potential to Achieve Goals: Fair    Frequency Min 2X/week     Co-evaluation               AM-PAC PT "6 Clicks" Mobility   Outcome Measure Help needed turning from your back to your side while in a flat bed without using bedrails?: A Lot Help needed moving from lying on your back to sitting on the side of a flat bed without using bedrails?: A Lot Help needed moving to and from a bed to a chair (including a wheelchair)?: Total Help needed standing up from a chair using your arms (e.g., wheelchair or bedside chair)?: Total Help needed to walk in hospital room?: Total Help needed climbing 3-5 steps with a railing? : Total 6 Click Score: 8    End of Session   Activity Tolerance: Patient limited by fatigue;Treatment limited secondary to medical complications (Comment);Patient limited by pain Patient left: in bed;with call bell/phone within reach;with bed alarm set Nurse Communication: Mobility status PT Visit Diagnosis: Unsteadiness on feet (R26.81);Difficulty in walking, not elsewhere classified (R26.2);History of falling (Z91.81)    Time: 7494-4967 PT Time Calculation (min) (ACUTE ONLY): 24 min   Charges:   PT Evaluation $PT Eval Low Complexity: 1 Low PT Treatments $Therapeutic Activity: 8-22 mins        [place] Office [phone] Weekend [phone]  Claretha Cooper 10/24/2021, 12:56 PM

## 2021-10-24 NOTE — Consult Note (Addendum)
Cardiology Consultation   Patient ID: [name] MRN: [id number]; DOB: 1934-12-27  Admit date: 10/23/2021 Date of Consult: 10/24/2021  PCP:  [name], MD   Trainer Providers Cardiologist:  None      Dr Percival Spanish saw in 2018  Patient Profile:   [name] is a 87 y.o. female with a hx of renal bpg 2nd fibromuscular dysplasia, Ao athersclerosis, HTN, HLD, DM, COPD, OSA on CPAP, gout, OA, recurrent pituitary adenoma s/p resection 2004, 2009 and 2010, who is being seen 10/24/2021 for the evaluation of atrial fib, RVR, at the request of Dr Ruthann Cancer.  History of Present Illness:   [name] has not seen cardiology since 2018.  She was admitted 09/06 with a mechanical fall 2nd weakness, dehydration.    She was found to have a large L pleural effusion w/ R mediastinal shift and new LLL mass. Neg PE.  S/p chest tube w/ > 2.5 L out.   Pt was in atrial fib, RVR, Cards asked to see.   Overnight, [name] spontaneously converted to sinus rhythm.  She is currently maintaining sinus rhythm.  She is not aware of sinus rhythm.  She was not aware of the atrial fibrillation.  She had no palpitations at all.  She does not check her blood pressure and heart rate at home.  The only symptom that she had was that she feels extremely weak.  This was fairly significant, she felt really good Tuesday, but was feeling extremely weak when states that she did not initially get out of bed.  When she got out of bed, to the bathroom, she was so extreme, she knew she.  She was able to get herself to the ground without significant injury.  She denies any presyncope or syncope in association with this.  She cannot tell that her heart rate is normal now just as she could not tell when it was too fast.  She is resting pretty comfortably today.  Her doctor had previously told her she had a murmur, he told her it was nothing to worry about.   Past Medical History:   Diagnosis Date   Allergic rhinitis    Arthritis    Complication of anesthesia    Hard time waking up   COPD (chronic obstructive pulmonary disease) ([place])    PFT 08/08/09-FEV1 1.98/107; R 0.77; small airway obst w/tresp to dilator;DLCO 44%   Diabetes mellitus    Fibromuscular dysplasia (HCC)    RAS   Hyperlipidemia    Hypertension    PONV (postoperative nausea and vomiting)    Pseudogout    Sleep apnea    AHI 89/hr    Past Surgical History:  Procedure Laterality Date   BACK SURGERY  2005   bilateral renal bypass     ESOPHAGOGASTRODUODENOSCOPY N/A 10/01/2012   Procedure: ESOPHAGOGASTRODUODENOSCOPY (EGD);  Surgeon: Missy Sabins, MD;  Location: [facility] ENDOSCOPY;  Service: Endoscopy;  Laterality: N/A;   nasal septal deviation     pituitary tumor removed     x 2   TONSILLECTOMY       Home Medications:  Prior to Admission medications   Medication Sig Start Date End Date Taking? Authorizing Provider  acetaminophen (TYLENOL) 325 MG tablet Take 325-650 mg by mouth every 6 (six) hours as needed for headache or mild pain (CANNOT EXCEED A SUM TOTAL OF 3,000 MG/DAY- FROM ALL COMBINED SOURCES and phone MD, if no relief). 02/13/20  Yes [provider]  amLODipine (  NORVASC) 10 MG tablet Take 1 tablet (10 mg total) by mouth daily. 02/24/20  Yes Fargo, Amy E, NP  atorvastatin (LIPITOR) 80 MG tablet Take 1 tablet (80 mg total) by mouth daily. 02/24/20  Yes Fargo, Amy E, NP  fexofenadine (ALLEGRA) 180 MG tablet Take 180 mg by mouth daily.   Yes [provider]  lactose free nutrition (BOOST) LIQD Take 237 mLs by mouth 3 (three) times daily between meals.   Yes [provider]  levothyroxine (SYNTHROID) 50 MCG tablet Take 50 mcg by mouth daily. 09/30/21  Yes [provider]  losartan (COZAAR) 50 MG tablet Take 1 tablet (50 mg total) by mouth daily. 02/24/20  Yes Fargo, Amy E, NP  polyvinyl alcohol (LIQUIFILM TEARS) 1.4 % ophthalmic solution Place 1 drop into both eyes as  needed for dry eyes.   Yes [provider]  bisacodyl (DULCOLAX) 10 MG suppository Place 10 mg rectally as needed for moderate constipation.    [provider]  gabapentin (NEURONTIN) 600 MG tablet Take 1 tablet (600 mg total) by mouth at bedtime. Patient not taking: Reported on 10/23/2021 02/24/20   Yvonna Alanis, NP  magnesium hydroxide (MILK OF MAGNESIA) 400 MG/5ML suspension Take by mouth daily as needed for mild constipation.    [provider]  Sodium Phosphates (RA SALINE ENEMA RE) Place 1 enema rectally as needed.    [provider]    Inpatient Medications: Scheduled Meds:  Chlorhexidine Gluconate Cloth  6 each Topical Daily   heparin  5,000 Units Subcutaneous Q8H   sodium chloride flush  10 mL Intrapleural Q8H   Continuous Infusions:  sodium chloride 20 mL/hr at 10/24/21 0700   sodium chloride 20 mL/hr at 10/24/21 0700   amiodarone 30 mg/hr (10/24/21 0700)   phenylephrine (NEO-SYNEPHRINE) Adult infusion Stopped (10/23/21 2335)   PRN Meds: acetaminophen, docusate sodium, mouth rinse, polyethylene glycol  Allergies:    Allergies  Allergen Reactions   Valacyclovir Hcl Rash    Social History:   Social History   Socioeconomic History   Marital status: Widowed    Spouse name: Not on file   Number of children: Not on file   Years of education: Not on file   Highest education level: Not on file  Occupational History   Occupation: retired school media specialist/librarian  Tobacco Use   Smoking status: Former    Packs/day: 1.00    Years: 35.00    Total pack years: 35.00    Types: Cigarettes    Quit date: 02/17/1997    Years since quitting: 24.6   Smokeless tobacco: Never  Substance and Sexual Activity   Alcohol use: No   Drug use: No   Sexual activity: Not on file  Other Topics Concern   Not on file  Social History Narrative   Not on file   Social Determinants of Health   Financial Resource Strain: Not on file  Food  Insecurity: Not on file  Transportation Needs: Not on file  Physical Activity: Not on file  Stress: Not on file  Social Connections: Not on file  Intimate Partner Violence: Not on file    Family History:   Family History  Problem Relation Age of Onset   Stroke Father    Breast cancer Mother      ROS:  Please see the history of present illness.  All other ROS reviewed and negative.     Physical Exam/Data:   Vitals:   10/24/21 0500 10/24/21 0600 10/24/21  0700 10/24/21 0800  BP: 123/60 (!) 144/66 (!) 142/65 (!) 142/70  Pulse: 81 79 82 89  Resp: (!) '21 19 19 '$ (!) 26  Temp:      TempSrc:      SpO2: 95% 98% 98% 97%  Weight: 59.7 kg       Intake/Output Summary (Last 24 hours) at 10/24/2021 0900 Last data filed at 10/24/2021 0800 Gross per 24 hour  Intake 716.39 ml  Output 2130 ml  Net -1413.61 ml      10/24/2021    5:00 AM 02/23/2020    3:12 PM 02/21/2020    9:28 AM  Last 3 Weights  Weight (lbs) 131 lb 9.8 oz 147 lb 147 lb  Weight (kg) 59.7 kg 66.679 kg 66.679 kg     Body mass index is 23.31 kg/m.  General:  frail, elderly female, in mild distress HEENT: normal for age, L eye w/ lower lid sag Neck: no JVD Vascular: No carotid bruits; Distal pulses 2+ bilaterally Cardiac:  normal S1, S2; RRR; 2/6 murmur  Lungs:  rales bilaterally, no wheezing, rhonchi, chest tube in place Abd: soft, nontender, no hepatomegaly  Ext: no edema Musculoskeletal:  No deformities, BUE and BLE strength weak but equal Skin: warm and dry  Neuro:  CNs 2-12 intact, no focal abnormalities noted Psych:  Normal affect   EKG:  The EKG was personally reviewed and demonstrates:  atrial fib, RVR, HR 148 Telemetry:  Telemetry was personally reviewed and demonstrates:  atrial fib, RVR >> SR   Relevant CV Studies:  ECHO: 2018 - Left ventricle: The cavity size was normal. Systolic function was    vigorous. The estimated ejection fraction was in the range of 65%    to 70%. Wall motion was normal; there  were no regional wall    motion abnormalities. Doppler parameters are consistent with    abnormal left ventricular relaxation (grade 1 diastolic    dysfunction). Doppler parameters are consistent with    indeterminate ventricular filling pressure.  - Aortic valve: Valve mobility was restricted. There was moderate    stenosis. There was no regurgitation. Peak velocity (S): 381    cm/s. Mean gradient (S): 35 mm Hg.  - Mitral valve: Calcified annulus. Transvalvular velocity was    within the normal range. There was no evidence for stenosis.    There was trivial regurgitation.  - Right ventricle: The cavity size was normal. Wall thickness was    normal. Systolic function was normal.  - Atrial septum: No defect or patent foramen ovale was identified    by color flow Doppler.  - Tricuspid valve: There was mild regurgitation.  - Pulmonary arteries: Systolic pressure was mildly increased. PA    peak pressure: 41 mm Hg (S).   R/L HEART CATH: 06/2011 Hemodynamics:                                     RA 2                                     RV 32/3                                     PA 34/11  mean 22  PCWP Mean 11                                     LV 161/18                                     AO 146/96                                     AV area  1.74                                     AV gradient (mean)  6.72             Oxygen saturations:                                     PA 64                                     AO 95               Cardiac Output (Fick) 4.2                               Cardiac Index (Fick) 2.2              Coronary angiography:   Coronary dominance: Right   Left mainstem: Normal   Left anterior descending (LAD): Mild luminal irregularities.  Small diagonals normal.   Left circumflex (LCx): RI large and normal.  AV groove mild luminal irregularities   Right coronary artery (RCA): Normal.  PDA large normal.  PL small to  moderate and normal.   Left ventriculography: Left ventricular systolic function is normal, LVEF is estimated at 55-65%, there is no significant mitral regurgitation    Final Conclusions:  Normal coronaries.  NL LF function.  Only mild AS by this measurement.  Echo suggests severe.  Pulmonary pressures and EDP are not elevated.   Recommendations:  Based on this the aortic valve does not appear to be the etiology for her dypsnea.  No further cardiac work up.       Minus Breeding 06/27/2011, 11:13 AM  Laboratory Data:  High Sensitivity Troponin:   Recent Labs  Lab 10/23/21 1545  TROPONINIHS 9     Chemistry Recent Labs  Lab 10/23/21 1545 10/24/21 0008  NA 140 138  K 3.7 3.9  CL 103 104  CO2 22 20*  GLUCOSE 90 105*  BUN 21 19  CREATININE 1.03* 0.89  CALCIUM 9.5 8.8*  MG  --  1.6*  GFRNONAA 53* >60  ANIONGAP 15 14    Recent Labs  Lab 10/23/21 1545  PROT 7.0  7.1  ALBUMIN 3.2*  AST 16  ALT 9  ALKPHOS 115  BILITOT 1.3*   Lipids No results for input(s): "CHOL", "TRIG", "HDL", "LABVLDL", "LDLCALC", "CHOLHDL" in the last 168 hours.  Hematology Recent Labs  Lab 10/23/21 1545 10/24/21 0008  WBC 10.3  9.6  RBC 4.83 4.40  HGB 13.6 12.4  HCT 43.4 40.6  MCV 89.9 92.3  MCH 28.2 28.2  MCHC 31.3 30.5  RDW 15.8* 15.7*  PLT 572* 479*   BNPNo results for input(s): "BNP", "PROBNP" in the last 168 hours.  DDimer No results for input(s): "DDIMER" in the last 168 hours. Lab Results  Component Value Date   TSH 0.35 (A) 02/16/2020   Magnesium  Date Value Ref Range Status  10/24/2021 1.6 (L) 1.7 - 2.4 mg/dL Final    Comment:    Performed at [facility], [place] [address], [place], [place] [address]     Radiology/Studies:  [facility] Chest Port 1 View  Result Date: 10/24/2021 CLINICAL DATA:  [id number].  Chest tube and left pleural effusion. EXAM: PORTABLE CHEST 1 VIEW COMPARISON:  Portable chest yesterday at 7:16 p.m. FINDINGS: 4:30 a.m. Pigtail left chest tube  positioning is unaltered. There is no visible pneumothorax. Moderate-sized left pleural effusion is noted with mild improvement, previously extending to the mid hilar level whereas now the meniscus is at the level of the inferior hilum. There may be some loculated fluid about the hilum, most likely in the fissure if present. There is left basilar overlying consolidation or atelectasis with the bilateral lungs elsewhere clear. There is cardiomegaly without vascular congestion. The aorta is tortuous and calcified. Osteopenia and thoracic degenerative changes. Kyphoplasty cement at T11. IMPRESSION: Moderate left pleural effusion with improvement. Stable left chest tube positioning with no visible pneumothorax. Query left perihilar loculated fluid in the fissure, with left basilar consolidation or atelectasis. Aortic atherosclerosis. Electronically Signed   By: Telford Nab M.D.   On: 10/24/2021 07:05   DG Chest 1 View  Result Date: 10/23/2021 CLINICAL DATA:  [id number] EXAM: CHEST  1 VIEW COMPARISON:  Chest x-ray 10/23/2021 FINDINGS: The heart and mediastinal contours are grossly within normal limits with silhouetting off of the left ventricle due to lung disease. Atherosclerotic plaque. Interval placement of a left chest tube with pigtail overlying the mid to lower left lung zone. Slightly improved aeration of the left upper lobe. No pulmonary edema. Slight interval decrease in size of an at least moderate sized left pleural effusion. No pneumothorax. No acute osseous abnormality. Degenerative changes of the right shoulder. IMPRESSION: 1. Interval placement of a left chest tube in appropriate position with slight interval decrease in size of an at least moderate volume left pleural effusion. 2.  Aortic Atherosclerosis (ICD10-I70.0). Electronically Signed   By: Iven Finn M.D.   On: 10/23/2021 19:34   CT Angio Chest PE W and/or Wo Contrast  Result Date: 10/23/2021 CLINICAL DATA:  History of fall abnormal  chest x-ray EXAM: CT ANGIOGRAPHY CHEST WITH CONTRAST TECHNIQUE: Multidetector CT imaging of the chest was performed using the standard protocol during bolus administration of intravenous contrast. Multiplanar CT image reconstructions and MIPs were obtained to evaluate the vascular anatomy. RADIATION DOSE REDUCTION: This exam was performed according to the departmental dose-optimization program which includes automated exposure control, adjustment of the mA and/or kV according to patient size and/or use of iterative reconstruction technique. CONTRAST:  53m OMNIPAQUE IOHEXOL 350 MG/ML SOLN COMPARISON:  Chest x-ray 10/23/2021, 02/07/2020, CT chest 03/16/2016, CT 10/01/2012 FINDINGS: Cardiovascular: Satisfactory opacification of the pulmonary arteries to the segmental level. No acute pulmonary embolism is visualized. Advanced aortic calcification. Poor contrast opacification of the aortic arch and descending thoracic aorta. Chronically displaced wall calcifications at the mid descending thoracic aorta, maximum aortic diameter of 3.6 cm at  this level. Coronary vascular calcifications. Normal cardiac size. No pericardial effusion. Mediastinum/Nodes: Midline trachea. Heterogeneous enlarged left lobe of thyroid with multiple nodules, unchanged. Stability for greater than 5 years implies benignity; no biopsy or followup indicated (ref: J Am Coll Radiol. 2015 Feb;12(2): 143-50). Mildly enlarged right paratracheal node measuring 11 mm, series 7, image 53. Upper esophagus contains a small amount of debris. The distal esophagus is decompressed. There is ill-defined soft tissue density within the subcarinal region that is difficult to separate from the decompressed esophagus. This appears contiguous with soft tissue density surrounding the left lower lobe bronchus. Mass effect on the left atrium. Lungs/Pleura: Emphysema. Large left-sided pleural effusion with shift of mediastinal contents to the right. Atelectasis of most of the  left lung. Heterogeneous hypodense areas within left lower lobe consolidative process, series 7, image 91 suggestive of necrosis. Left lower lobe consolidative process cannot be separated from the soft tissue abnormality surrounding the left lower lobe bronchus and extending into the mediastinum. Delayed images of the lower chest demonstrate a heterogenous enhancing mass abutting or arising from the left posterior pleural surface, this measures 7 by 3.6 cm, series 10, image 26. Suspicion of additional pleural based lesion posterior medially at the level of the distal arch, series 7 image 49. mild irregular narrowing of left upper lobe bronchus as well. Upper Abdomen: No acute process in the upper abdomen. Stable 1.4 cm left adrenal mass, likely representing adenoma given long-term stability, no follow-up imaging is recommended. Chronic mild aneurysmal dilatation of the left renal artery. Musculoskeletal: No acute osseous abnormality Review of the MIP images confirms the above findings. IMPRESSION: 1. Negative for acute pulmonary embolus. 2. Large left-sided pleural effusion with shift of mediastinal contents to the right. Atelectasis of most of the left lung. Occluded left lower lobe bronchus. Poorly defined soft tissue density/suspected hilar/infrahilar and left lower lobe mass, poorly separated from ill-defined subcarinal soft tissue density. This exerts mass effect on the left atrium. Findings concerning for malignancy. Mass or consolidation in the left lower lobe demonstrates heterogenous hypodensities suggesting necrosis. Pulmonary consultation is recommended. 3. Additional finding of heterogenous enhancing left lower lobe mass measuring up to 7 cm either abutting or arising from the pleural surface, concerning for metastatic lesion. Suspicion of additional probable pleural based mass slightly more superior at the level of the distal arch. Critical Value/emergent results were called by telephone at the time of  interpretation on 10/23/2021 at 6:54 pm to provider MADISON [facility] , who verbally acknowledged these results. Aortic Atherosclerosis (ICD10-I70.0) and Emphysema (ICD10-J43.9). Electronically Signed   By: Donavan Foil M.D.   On: 10/23/2021 18:54   CT HEAD WO CONTRAST (5MM)  Result Date: 10/23/2021 CLINICAL DATA:  Fall EXAM: CT HEAD WITHOUT CONTRAST CT CERVICAL SPINE WITHOUT CONTRAST TECHNIQUE: Multidetector CT imaging of the head and cervical spine was performed following the standard protocol without intravenous contrast. Multiplanar CT image reconstructions of the cervical spine were also generated. RADIATION DOSE REDUCTION: This exam was performed according to the departmental dose-optimization program which includes automated exposure control, adjustment of the mA and/or kV according to patient size and/or use of iterative reconstruction technique. COMPARISON:  Brain MRI 07/02/2017 and cervical spine CT 03/16/2016 FINDINGS: CT HEAD FINDINGS Brain: There is no acute intracranial hemorrhage, extra-axial fluid collection, or acute infarct. Background parenchymal volume is normal for age. The ventricles are normal in size. Gray-white differentiation is preserved. There is no mass lesion. There is no mass effect or midline shift. Vascular: There is calcification  of the bilateral carotid siphons. Skull: Normal. Negative for fracture or focal lesion. Sinuses/Orbits: The imaged paranasal sinuses are clear. Bilateral lens implants are in place. The globes and orbits are otherwise unremarkable. Other: None. CT CERVICAL SPINE FINDINGS Alignment: Trace anterolisthesis of C3 on C4 and C4 on C5 and trace retrolisthesis of C5 on C6 is unchanged since 2018. There is no jumped or perched facet or other evidence of traumatic malalignment. Skull base and vertebrae: Skull base alignment is maintained. Vertebral body heights are preserved. There is no evidence of acute fracture. There is no suspicious osseous lesion. Soft tissues  and spinal canal: No prevertebral fluid or swelling. No visible canal hematoma. Disc levels: Disc space narrowing and degenerative endplate change most advanced at C5-C6. Facet arthropathy is most advanced at C2-C3 bilaterally and on the left at C3-C4. Upper chest: The lungs are assessed on the separately dictated CTA chest. Other: None. IMPRESSION: 1. No acute intracranial pathology. 2. No acute fracture or traumatic malalignment of the cervical spine. Electronically Signed   By: Valetta Mole M.D.   On: 10/23/2021 18:28   CT Cervical Spine Wo Contrast  Result Date: 10/23/2021 CLINICAL DATA:  Fall EXAM: CT HEAD WITHOUT CONTRAST CT CERVICAL SPINE WITHOUT CONTRAST TECHNIQUE: Multidetector CT imaging of the head and cervical spine was performed following the standard protocol without intravenous contrast. Multiplanar CT image reconstructions of the cervical spine were also generated. RADIATION DOSE REDUCTION: This exam was performed according to the departmental dose-optimization program which includes automated exposure control, adjustment of the mA and/or kV according to patient size and/or use of iterative reconstruction technique. COMPARISON:  Brain MRI 07/02/2017 and cervical spine CT 03/16/2016 FINDINGS: CT HEAD FINDINGS Brain: There is no acute intracranial hemorrhage, extra-axial fluid collection, or acute infarct. Background parenchymal volume is normal for age. The ventricles are normal in size. Gray-white differentiation is preserved. There is no mass lesion. There is no mass effect or midline shift. Vascular: There is calcification of the bilateral carotid siphons. Skull: Normal. Negative for fracture or focal lesion. Sinuses/Orbits: The imaged paranasal sinuses are clear. Bilateral lens implants are in place. The globes and orbits are otherwise unremarkable. Other: None. CT CERVICAL SPINE FINDINGS Alignment: Trace anterolisthesis of C3 on C4 and C4 on C5 and trace retrolisthesis of C5 on C6 is unchanged  since 2018. There is no jumped or perched facet or other evidence of traumatic malalignment. Skull base and vertebrae: Skull base alignment is maintained. Vertebral body heights are preserved. There is no evidence of acute fracture. There is no suspicious osseous lesion. Soft tissues and spinal canal: No prevertebral fluid or swelling. No visible canal hematoma. Disc levels: Disc space narrowing and degenerative endplate change most advanced at C5-C6. Facet arthropathy is most advanced at C2-C3 bilaterally and on the left at C3-C4. Upper chest: The lungs are assessed on the separately dictated CTA chest. Other: None. IMPRESSION: 1. No acute intracranial pathology. 2. No acute fracture or traumatic malalignment of the cervical spine. Electronically Signed   By: Valetta Mole M.D.   On: 10/23/2021 18:28   DG Chest 2 View  Result Date: 10/23/2021 CLINICAL DATA:  Trauma, fall, difficulty breathing EXAM: CHEST - 2 VIEW COMPARISON:  02/07/2020 FINDINGS: Transplant diameter heart is increased. There is almost complete opacification of left hemithorax. Findings suggest large left pleural effusion. Evaluation of right lung for infiltrates is limited by the large effusion. In view of history of fall, possibility of hemothorax is not excluded. Right lung is clear. Right  lateral CP angle is clear. There is no pneumothorax. There is evidence of previous vertebroplasty in lower thoracic spine. Degenerative changes are noted in both shoulders. IMPRESSION: There is interval appearance of large left pleural effusion. This may suggest parapneumonic effusion or hemothorax or neoplastic process. Follow-up CT chest may be considered. Electronically Signed   By: Elmer Picker M.D.   On: 10/23/2021 16:12     Assessment and Plan:   PAF - started on IV Amio, now at 30 mg/hr - s/p spont conversion to SR - on DVT heparin 5000 u q 8 - Mg low at 1.6 s/p IV supplement - TSH not checked, will order - it is possible that her  weakness was at least partly related to the atrial fib -Encouraged her to check her heart rate and blood pressure if she has similar symptoms -MD advise if she is a candidate for DOAC  2.  Pleural effusion -Chest tube was inserted yesterday and she has 2 L out from the chest tube, but her chest x-ray is still abnormal. - based on elevated LDH, effusion is exudative - Mgt per CCM  3. Mod aortic stenosis - mean gradient 35, peak gradient 58 by echo this admission -Valve area 0.73 cm (Vmean) -On a 2013 echo, the mean gradient was 38 with a peak gradient of 57 - valve area 1.15 centimeters squared by Vmax -Follow for now, assess for symptoms from this once her general medical condition is improved   Risk Assessment/Risk Scores:        CHA2DS2-VASc Score = 6   This indicates a 9.7% annual risk of stroke. The patient's score is based upon: CHF History: 0 HTN History: 1 Diabetes History: 1 Stroke History: 0 Vascular Disease History: 1 Age Score: 2 Gender Score: 1    For questions or updates, please contact [place] Please consult [email or web address] for contact info under    Signed, Rosaria Ferries, PA-C  10/24/2021 9:00 AM  Personally seen and examined. Agree with above.  87 year old with paroxysmal atrial fibrillation admitted with mechanical fall weakness dehydration large left pleural effusion with new left lower lobe mass.  No PE.  Currently comfortable in bed.  Decreased breath sounds left base.  Regular rate and rhythm.  2/6 systolic murmur Alert and oriented.  Telemetry personally reviewed shows conversion of atrial fibrillation to sinus rhythm.    Echocardiogram in 2018 showed normal ejection fraction with moderate aortic stenosis.  Assessment and plan  Paroxysmal atrial fibrillation - IV amiodarone with conversion.  Her underlying medical illness contributed to the atrial fibrillation.  Since atrial fibrillation episode was brief, and currently requires  chest tube with potential for bleeding, lets hold off on long-term anticoagulation unless atrial fibrillation returns for a sustained period of time. -It would not be unreasonable to transition to p.o. amiodarone tomorrow, 200 mg daily after continue to load today.  I would envision that she would continue amiodarone to maintain sinus rhythm for the next 4 weeks and then if sinus rhythm continues as outpatient, I would discontinue the amiodarone.  Moderate aortic stenosis - Mean gradient 35 mmHg.  Not a candidate for surgery or TAVR.  Pleural effusion - New lung mass  We will go and sign off.  Please let us know if we can be of further assistance.  Candee Furbish, MD

## 2021-10-24 NOTE — Progress Notes (Addendum)
NAME:  [name], MRN:  [id number], DOB:  Mar 27, 1934, LOS: 1 ADMISSION DATE:  10/23/2021 CONSULTATION DATE:  10/23/2021 REFERRING MD:  Kommor - EDP CHIEF COMPLAINT:  Hypotension in the setting of Afib with RVR   History of Present Illness:  87 year old woman who presented to [facility] ED 9/6 post-mechanical fall, also with decreased PO intake/weakness x 5 days. Hit posterior head while getting out of the bathroom; no LOC. Not on AC. PMHx significant for HTN, HLD, COPD/OSA, T2DM, fibromuscular dysplasia, pseudogout.  Per chart review, patient had been feeling very weak and sustained a mechanical fall when ambulating back from the restroom. She struck her posterior head on the floor but did not have LOC. Denies recent fever/chills, CP/SOB, n/v/d or abdominal pain. Endorses HA s/p fall. EMS noted mild hypotension in the field, initially corrected after fluid administration.  On ED evaluation, afebrile with HR 100s, mildly tachypneic to 25, BP 129/62, SpO2 91% on RA. Labs notable for WBC/H&H WNL, Plt 572, lytes WNL, Cr 1.03 (baseline 0.8), normal AST/ALT, Tbili 1.3. Serum LDH 213, trop WNL. LA 1.6. COVID/Flu negative. CT Head/Cspine NAICA, no fracture. CXR with large L pleural effusion. CTA Chest negative for PE, large left pleural effusion noted with R mediastinal shift, occluded LLL bronchus, concern for poorly defined LLL mass.   PCCM consulted for chest tube placement/new LLL mass. Patient became hypotensive requiring Neosynephrine initiation and was admitted to ICU.  Pertinent Medical History:   Past Medical History:  Diagnosis Date   Allergic rhinitis    Arthritis    Complication of anesthesia    Hard time waking up   COPD (chronic obstructive pulmonary disease) ([place])    PFT 08/08/09-FEV1 1.98/107; R 0.77; small airway obst w/tresp to dilator;DLCO 44%   Diabetes mellitus    Fibromuscular dysplasia (HCC)    RAS   Hyperlipidemia    Hypertension    PONV (postoperative nausea and  vomiting)    Pseudogout    Sleep apnea    AHI 89/hr   Significant Hospital Events: Including procedures, antibiotic start and stop dates in addition to other pertinent events   9/6 - Presented to [facility] ED for mechanical fall with weakness, poor PO intake. CT Chest negative for PE, c/f new LLL mass and large L pleural effusion with R mediastinal shift. CT Head/Cspine negative. Chest tube placed.  Interim History / Subjective:  Over 2L out of left chest tube. Exudative with LDH 508. Gram stain neg. Feels a bit better this AM but not back to baseline. Still on 4L O2. AFRVR overnight, started on amio and converted to NSR.  Objective:  Blood pressure (!) 142/70, pulse 89, temperature 98 F (36.7 C), temperature source Oral, resp. rate (!) 26, weight 59.7 kg, SpO2 97 %.        Intake/Output Summary (Last 24 hours) at 10/24/2021 0907 Last data filed at 10/24/2021 0800 Gross per 24 hour  Intake 716.39 ml  Output 2130 ml  Net -1413.61 ml   Filed Weights   10/24/21 0500  Weight: 59.7 kg    Physical Examination: General: Elderly female, resting in bed, in NAD. Neuro: A&O x 3, no deficits. HEENT: [place]/AT. Sclerae anicteric. EOMI. Cardiovascular: RRR, no M/R/G.  Lungs: Respirations even and unlabored.  Slightly diminished left base, otherwise clear. Abdomen: BS x 4, soft, NT/ND.  Musculoskeletal: No gross deformities, no edema.  Skin: Intact, warm, no rashes.  Assessment & Plan:   Large left pleural effusion s/p chest tube placement 9/6 with over  2L output. Exudative per LDH with negative gram stain. Cytology pending. New LLL mass  CTA Chest 9/6 with large L pleural effusion noted with R mediastinal shift, occluded LLL bronchus, concern for poorly defined LLL mass.  Negative for PE. - Continue suction to -20 for now. - Output has slowed significantly and looks like still has fluid there; therefore, will give 1 dose tPA/pulmozyme and assess her response.  - CT once more fluid out. - F/u  pleural fluid studies including cytology. - Consider Oncology consult pending above.  Afib with RVR - Continue amiodarone for now but can likely transition to PO at least versus stop all together? - Cards following, appreciate the assistance. - Maintain K > 4, Mg > 2 (Mg repleted this AM). - F/u on TSH. - Start lovenox, defer to cards on ongoing [facility] thereafter.  Mechanical fall secondary to weakness - CT Head/Cspine negative for AICA/fracture. - PT eval.  Off neo overnight, BP stable. Will ask TRH to assume care in AM 9/8 with PCCM to follow for chest tube management. Defer to cards recs re A.fib and rhythm control/anticoagulation/etc.   Per primary team.    Montey Hora, [place] For pager details, please see AMION or use Epic chat  After 1900, please call [facility] for cross coverage needs 10/24/2021, 9:31 AM

## 2021-10-25 ENCOUNTER — Inpatient Hospital Stay (HOSPITAL_COMMUNITY): Payer: Medicare PPO

## 2021-10-25 ENCOUNTER — Inpatient Hospital Stay: Payer: Self-pay

## 2021-10-25 DIAGNOSIS — J9601 Acute respiratory failure with hypoxia: Secondary | ICD-10-CM | POA: Diagnosis not present

## 2021-10-25 DIAGNOSIS — J449 Chronic obstructive pulmonary disease, unspecified: Secondary | ICD-10-CM | POA: Diagnosis not present

## 2021-10-25 DIAGNOSIS — I4891 Unspecified atrial fibrillation: Secondary | ICD-10-CM | POA: Diagnosis not present

## 2021-10-25 DIAGNOSIS — J90 Pleural effusion, not elsewhere classified: Secondary | ICD-10-CM | POA: Diagnosis not present

## 2021-10-25 DIAGNOSIS — D352 Benign neoplasm of pituitary gland: Secondary | ICD-10-CM

## 2021-10-25 DIAGNOSIS — I700 Atherosclerosis of aorta: Secondary | ICD-10-CM

## 2021-10-25 DIAGNOSIS — R579 Shock, unspecified: Secondary | ICD-10-CM

## 2021-10-25 DIAGNOSIS — I10 Essential (primary) hypertension: Secondary | ICD-10-CM

## 2021-10-25 DIAGNOSIS — I480 Paroxysmal atrial fibrillation: Secondary | ICD-10-CM | POA: Diagnosis not present

## 2021-10-25 DIAGNOSIS — R918 Other nonspecific abnormal finding of lung field: Secondary | ICD-10-CM

## 2021-10-25 DIAGNOSIS — E785 Hyperlipidemia, unspecified: Secondary | ICD-10-CM

## 2021-10-25 LAB — LACTIC ACID, PLASMA
Lactic Acid, Venous: 2.8 mmol/L (ref 0.5–1.9)
Lactic Acid, Venous: 1.7 mmol/L (ref 0.5–1.9)
Lactic Acid, Venous: 1.6 mmol/L (ref 0.5–1.9)

## 2021-10-25 LAB — BLOOD GAS, ARTERIAL
Acid-base deficit: 0.9 mmol/L (ref 0.0–2.0)
Bicarbonate: 21.1 mmol/L (ref 20.0–28.0)
O2 Saturation: 99.5 %
Patient temperature: 37
pCO2 arterial: 27 mmHg — ABNORMAL LOW (ref 32–48)
pH, Arterial: 7.5 — ABNORMAL HIGH (ref 7.35–7.45)
pO2, Arterial: 93 mmHg (ref 83–108)

## 2021-10-25 LAB — ECHOCARDIOGRAM COMPLETE
AR max vel: 0.67 cm2
AV Area VTI: 0.74 cm2
AV Area mean vel: 0.66 cm2
AV Mean grad: 21.5 mmHg
AV Peak grad: 34 mmHg
Ao pk vel: 2.92 m/s
Area-P 1/2: 3.63 cm2
Calc EF: 78.6 %
MV M vel: 2.82 m/s
MV Peak grad: 31.8 mmHg
P 1/2 time: 513 msec
S' Lateral: 1.5 cm
Single Plane A2C EF: 82.1 %
Single Plane A4C EF: 75 %
Weight: 2158.74 oz

## 2021-10-25 LAB — COMPREHENSIVE METABOLIC PANEL
ALT: 8 U/L (ref 0–44)
AST: 12 U/L — ABNORMAL LOW (ref 15–41)
Albumin: 2.2 g/dL — ABNORMAL LOW (ref 3.5–5.0)
Alkaline Phosphatase: 92 U/L (ref 38–126)
Anion gap: 11 (ref 5–15)
BUN: 24 mg/dL — ABNORMAL HIGH (ref 8–23)
CO2: 21 mmol/L — ABNORMAL LOW (ref 22–32)
Calcium: 8.7 mg/dL — ABNORMAL LOW (ref 8.9–10.3)
Chloride: 100 mmol/L (ref 98–111)
Creatinine, Ser: 0.99 mg/dL (ref 0.44–1.00)
GFR, Estimated: 56 mL/min — ABNORMAL LOW (ref >60)
Glucose, Bld: 210 mg/dL — ABNORMAL HIGH (ref 70–99)
Potassium: 3.3 mmol/L — ABNORMAL LOW (ref 3.5–5.1)
Sodium: 132 mmol/L — ABNORMAL LOW (ref 135–145)
Total Bilirubin: 0.6 mg/dL (ref 0.3–1.2)
Total Protein: 5.3 g/dL — ABNORMAL LOW (ref 6.5–8.1)

## 2021-10-25 LAB — CBC
HCT: 42.8 % (ref 36.0–46.0)
Hemoglobin: 13.5 g/dL (ref 12.0–15.0)
MCH: 28.1 pg (ref 26.0–34.0)
MCHC: 31.5 g/dL (ref 30.0–36.0)
MCV: 89.2 fL (ref 80.0–100.0)
Platelets: 626 10*3/uL — ABNORMAL HIGH (ref 150–400)
RBC: 4.8 MIL/uL (ref 3.87–5.11)
RDW: 15.6 % — ABNORMAL HIGH (ref 11.5–15.5)
WBC: 12.8 10*3/uL — ABNORMAL HIGH (ref 4.0–10.5)
nRBC: 0 % (ref 0.0–0.2)

## 2021-10-25 LAB — GLUCOSE, CAPILLARY: Glucose-Capillary: 205 mg/dL — ABNORMAL HIGH (ref 70–99)

## 2021-10-25 LAB — BRAIN NATRIURETIC PEPTIDE: B Natriuretic Peptide: 510.4 pg/mL — ABNORMAL HIGH (ref 0.0–100.0)

## 2021-10-25 LAB — CORTISOL: Cortisol, Plasma: 31 ug/dL

## 2021-10-25 LAB — PROTIME-INR
INR: 1.5 — ABNORMAL HIGH (ref 0.8–1.2)
Prothrombin Time: 17.7 seconds — ABNORMAL HIGH (ref 11.4–15.2)

## 2021-10-25 LAB — TROPONIN I (HIGH SENSITIVITY)
Troponin I (High Sensitivity): 20 ng/L — ABNORMAL HIGH (ref <18)
Troponin I (High Sensitivity): 19 ng/L — ABNORMAL HIGH (ref <18)

## 2021-10-25 LAB — LIPASE, BLOOD: Lipase: 31 U/L (ref 11–51)

## 2021-10-25 LAB — CYTOLOGY - NON PAP

## 2021-10-25 LAB — PROCALCITONIN: Procalcitonin: 0.77 ng/mL

## 2021-10-25 MED ORDER — POTASSIUM CHLORIDE 20 MEQ PO PACK
20.0000 meq | PACK | Freq: Once | ORAL | Status: AC
Start: 2021-10-25 — End: 2021-10-25
  Administered 2021-10-25: 20 meq via ORAL
  Filled 2021-10-25: qty 1

## 2021-10-25 MED ORDER — AMIODARONE HCL 200 MG PO TABS
200.0000 mg | ORAL_TABLET | Freq: Every day | ORAL | Status: DC
  Administered 2021-10-25: 200 mg via ORAL
  Filled 2021-10-25: qty 1

## 2021-10-25 MED ORDER — HYDROCORTISONE SOD SUC (PF) 100 MG IJ SOLR
100.0000 mg | Freq: Once | INTRAMUSCULAR | Status: AC
Start: 2021-10-25 — End: 2021-10-25
  Administered 2021-10-25: 100 mg via INTRAVENOUS
  Filled 2021-10-25: qty 2

## 2021-10-25 MED ORDER — SODIUM CHLORIDE 0.9 % IV SOLN
250.0000 mL | INTRAVENOUS | Status: DC
  Administered 2021-10-27: 250 mL via INTRAVENOUS

## 2021-10-25 MED ORDER — PHENYLEPHRINE 80 MCG/ML (10ML) SYRINGE FOR IV PUSH (FOR BLOOD PRESSURE SUPPORT)
PREFILLED_SYRINGE | INTRAVENOUS | Status: AC
  Filled 2021-10-25: qty 10

## 2021-10-25 MED ORDER — ONDANSETRON HCL 4 MG/2ML IJ SOLN
INTRAMUSCULAR | Status: AC
  Filled 2021-10-25: qty 2

## 2021-10-25 MED ORDER — PHENYLEPHRINE 80 MCG/ML (10ML) SYRINGE FOR IV PUSH (FOR BLOOD PRESSURE SUPPORT)
160.0000 ug | PREFILLED_SYRINGE | Freq: Once | INTRAVENOUS | Status: AC
  Administered 2021-10-25: 160 ug via INTRAVENOUS

## 2021-10-25 MED ORDER — AMIODARONE HCL IN DEXTROSE 360-4.14 MG/200ML-% IV SOLN
30.0000 mg/h | INTRAVENOUS | Status: DC
  Administered 2021-10-25: 30 mg/h via INTRAVENOUS
  Filled 2021-10-25: qty 200

## 2021-10-25 MED ORDER — METRONIDAZOLE 500 MG/100ML IV SOLN
500.0000 mg | Freq: Two times a day (BID) | INTRAVENOUS | Status: AC
  Administered 2021-10-25 – 2021-11-02 (×17): 500 mg via INTRAVENOUS
  Filled 2021-10-25 (×17): qty 100

## 2021-10-25 MED ORDER — ATORVASTATIN CALCIUM 40 MG PO TABS
80.0000 mg | ORAL_TABLET | Freq: Every day | ORAL | Status: DC
  Administered 2021-10-25 – 2021-11-02 (×9): 80 mg via ORAL
  Filled 2021-10-25 (×9): qty 2

## 2021-10-25 MED ORDER — NOREPINEPHRINE 4 MG/250ML-% IV SOLN
INTRAVENOUS | Status: AC
  Filled 2021-10-25: qty 250

## 2021-10-25 MED ORDER — DOCUSATE SODIUM 283 MG RE ENEM: 1.0000 | ENEMA | Freq: Every day | RECTAL | Status: DC | PRN

## 2021-10-25 MED ORDER — SODIUM BICARBONATE 8.4 % IV SOLN
100.0000 meq | Freq: Once | INTRAVENOUS | Status: AC
  Administered 2021-10-25: 100 meq via INTRAVENOUS

## 2021-10-25 MED ORDER — AMIODARONE HCL IN DEXTROSE 360-4.14 MG/200ML-% IV SOLN: 60.0000 mg/h | INTRAVENOUS | Status: DC

## 2021-10-25 MED ORDER — NOREPINEPHRINE 4 MG/250ML-% IV SOLN
2.0000 ug/min | INTRAVENOUS | Status: DC
  Administered 2021-10-25: 1 ug/min via INTRAVENOUS
  Administered 2021-10-25: 6 ug/min via INTRAVENOUS
  Administered 2021-10-26: 4 ug/min via INTRAVENOUS
  Filled 2021-10-25 (×2): qty 250

## 2021-10-25 MED ORDER — SODIUM CHLORIDE (PF) 0.9 % IJ SOLN
10.0000 mg | Freq: Once | INTRAMUSCULAR | Status: DC
  Filled 2021-10-25: qty 10

## 2021-10-25 MED ORDER — PHENYLEPHRINE HCL-NACL 20-0.9 MG/250ML-% IV SOLN
0.0000 ug/min | INTRAVENOUS | Status: DC
  Administered 2021-10-25: 20 ug/min via INTRAVENOUS

## 2021-10-25 MED ORDER — SODIUM CHLORIDE 0.9% FLUSH
10.0000 mL | INTRAVENOUS | Status: DC | PRN
  Administered 2021-10-25: 10 mL

## 2021-10-25 MED ORDER — IOHEXOL 300 MG/ML  SOLN: 100.0000 mL | Freq: Once | INTRAMUSCULAR | Status: DC | PRN

## 2021-10-25 MED ORDER — PHENYLEPHRINE 80 MCG/ML (10ML) SYRINGE FOR IV PUSH (FOR BLOOD PRESSURE SUPPORT)
80.0000 ug | PREFILLED_SYRINGE | Freq: Once | INTRAVENOUS | Status: AC
  Administered 2021-10-25: 80 ug via INTRAVENOUS

## 2021-10-25 MED ORDER — PERFLUTREN LIPID MICROSPHERE
1.0000 mL | INTRAVENOUS | Status: AC | PRN
  Administered 2021-10-25: 2 mL via INTRAVENOUS

## 2021-10-25 MED ORDER — STERILE WATER FOR INJECTION IJ SOLN
5.0000 mg | Freq: Once | RESPIRATORY_TRACT | Status: DC
  Filled 2021-10-25: qty 5

## 2021-10-25 MED ORDER — SODIUM CHLORIDE 0.9% FLUSH
10.0000 mL | Freq: Two times a day (BID) | INTRAVENOUS | Status: DC
  Administered 2021-10-25 – 2021-10-26 (×4): 10 mL
  Administered 2021-10-27: 20 mL
  Administered 2021-10-27 – 2021-10-30 (×6): 10 mL
  Administered 2021-10-30 – 2021-10-31 (×2): 20 mL
  Administered 2021-10-31 – 2021-11-02 (×5): 10 mL
  Administered 2021-11-03: 20 mL
  Administered 2021-11-04 – 2021-11-05 (×3): 10 mL

## 2021-10-25 MED ORDER — SODIUM CHLORIDE 0.9 % IV SOLN
8.0000 mg | Freq: Once | INTRAVENOUS | Status: AC
  Administered 2021-10-25: 8 mg via INTRAVENOUS

## 2021-10-25 MED ORDER — MAGNESIUM SULFATE 2 GM/50ML IV SOLN
2.0000 g | Freq: Once | INTRAVENOUS | Status: AC
  Administered 2021-10-25: 2 g via INTRAVENOUS
  Filled 2021-10-25: qty 50

## 2021-10-25 MED ORDER — PHENYLEPHRINE HCL-NACL 20-0.9 MG/250ML-% IV SOLN
INTRAVENOUS | Status: AC
  Filled 2021-10-25: qty 250

## 2021-10-25 MED ORDER — SODIUM CHLORIDE (PF) 0.9 % IJ SOLN
INTRAMUSCULAR | Status: AC
  Filled 2021-10-25: qty 50

## 2021-10-25 MED ORDER — LACTATED RINGERS IV BOLUS
1000.0000 mL | Freq: Once | INTRAVENOUS | Status: AC
  Administered 2021-10-25: 1000 mL via INTRAVENOUS

## 2021-10-25 MED ORDER — SODIUM CHLORIDE 0.9 % IV SOLN
8.0000 mg | Freq: Once | INTRAVENOUS | Status: DC
  Filled 2021-10-25: qty 4

## 2021-10-25 MED ORDER — LACTATED RINGERS IV BOLUS
500.0000 mL | Freq: Once | INTRAVENOUS | Status: AC
  Administered 2021-10-25: 500 mL via INTRAVENOUS

## 2021-10-25 MED ORDER — HYDROCORTISONE SOD SUC (PF) 100 MG IJ SOLR
100.0000 mg | Freq: Two times a day (BID) | INTRAMUSCULAR | Status: DC
  Administered 2021-10-25: 100 mg via INTRAVENOUS
  Filled 2021-10-25: qty 2

## 2021-10-25 NOTE — Assessment & Plan Note (Signed)
No wheezing at present

## 2021-10-25 NOTE — Progress Notes (Signed)
Progress Note   Patient: [name] UEA:[id number] DOB: [birth year] DOA: 10/23/2021     2 DOS: the patient was seen and examined on 10/25/2021 at 9:15AM      Brief hospital course: Mrs. Stuck is an 87 y.o. F with COPD not on home O2, OSA on CPAP, HTN, DM, HLD, hx aortic atherosclerosis, possible chronic descending aortic dissection, and hx renal artery bypass, hx pituitary adenoma s/p resection 2004, 2009, and 2010, hx CPPD, OA and chronic low back pain not on daily opiates who presented with few days weakness then a fall.  EMS found her hypotensive.  In the ER, CXR showed new large left effusion, CTA chest confirmed large left-sided effusion with consolidative mass.  Patient developed Afib with RVR.   9/6: Admitted to ICU on neo, amio, chest tube placed 9/7: BP normalized off neo, converted to sinus, Cardiology consulted; tpa/dornase instilled 9/8: Transferred OOU to hospitalists     Assessment and Plan: * Pleural effusion on left Chest tube in place.  2.1L out on day 1, 1.4L out yesterday after lytics - Per Pulm, maintain to suction, repeat lytics, monitor output - Analgesics as needed  Lung mass Need imaging after adequate drainage of effusion to delineate better. - Continue chest tube to suction - Repeat cross-sectional imaging later  Paroxysmal atrial fibrillation with RVR ([place]) New onset.  CHA2DS2-Vasc would be 6 (age 47, gender 1, PVD 1, HTN 1, DM 1).    Started on amiodarone gtt in the ER, converted to sinus.  Transitioned to PO this morning and reverted to fast Afib with stopping infusion. - Hold AC until plan for lung "mass" clearer - Resume amiodarone infusion - Supplement low Mag - Supp K above 4  Pituitary adenoma (HCC) Prior history of multiple resections.  TSH low. - Check free T4, T3 - Check AM cortisol and ACTH  Hypomagnesemia - Supplement mag  Acute respiratory failure with hypoxia (HCC) Presnted with large left effusion, tachypnea, shortness  of rbeath.  Required 4L O2, admitted to ICU.  Chest tube placed, now weaned down to 2L.  Hypotension Blood pressure soft in the setting of A-fib - Give fluids, resume amiodarone infusion  Aortic atherosclerosis and peripheral vascular disease Prior history of extensive aortic atherosclerosis/calcifications, question of descending thoracic chronic dissection evaluated by vascular surgery, managed conservatively, and fibromuscular dysplasia of the renal arteries status post renal artery bypass graft. - Resume home Lipitor  COPD (chronic obstructive pulmonary disease) (HCC) No wheezing at present  Type 2 diabetes mellitus with neurological complications ([place]) Diet controlled, last A1c less than 6 off medications. - Check CBG twice daily  Obstructive sleep apnea - CPAP at night  Essential hypertension Blood pressure soft - Hold losartan and amlodipine  Hyperlipidemia - Continue atorvastatin          Subjective: Patient feels tired, malaise.  No significant chest pain from her chest tube, no dyspnea, no fever, no confusion.     Physical Exam: BP 90/60   Pulse (!) 119   Temp (!) 97.1 F (36.2 C) (Oral)   Resp (!) 29   Wt 61.2 kg   SpO2 92%   BMI 23.90 kg/m   Thin elderly female, lying in bed, appears tired, having a new IV placed Tachycardic and irregular, no murmurs, no peripheral edema Respiratory rate normal but shallow, no wheezing bilaterally, diminished on the left, normal on the right Abdomen soft without tenderness palpation or guarding, no ascites or distention Mild right hyperemia around her left recent  AC IV.  Localized. Attention normal, affect normal, judgment and insight appear normal, face symmetric, speech fluent, moves upper extremities with normal strength and coordination    Data Reviewed: Discussed with cardiology Gram stain of her pleural effusion negative, LDH elevated.  Cytology pending Urinalysis unremarkable Basic metabolic panel  electrolytes and renal function normal Phosphorus normal, magnesium slightly low CBC normal TSH low CT of the chest as described above      Disposition: Status is: Inpatient         Author: Edwin Dada, MD 10/25/2021 10:08 AM  For on call review [email or web address].

## 2021-10-25 NOTE — Assessment & Plan Note (Signed)
Continue atorvastatin

## 2021-10-25 NOTE — Assessment & Plan Note (Addendum)
Prior history of extensive aortic atherosclerosis/calcifications, question of descending thoracic chronic dissection evaluated by vascular surgery, managed conservatively, and fibromuscular dysplasia of the renal arteries status post renal artery bypass graft. - Resume home Lipitor

## 2021-10-25 NOTE — Assessment & Plan Note (Signed)
Prior history of multiple resections.  TSH low. - Check free T4, T3 - Check AM cortisol and ACTH

## 2021-10-25 NOTE — Assessment & Plan Note (Addendum)
Presnted with large left effusion, tachypnea, shortness of rbeath.  Required 4L O2, admitted to ICU.  Chest tube placed, now weaned down to 2L.

## 2021-10-25 NOTE — Progress Notes (Signed)
PCCM interim progress note  S: Objective Sudden decompensation in the last hour.  Call rapidly to the bedside because of hypotension.  At this point in time patient was vomiting.  She has since complained of intermittent lower abdominal pain but no obvious tenderness or rigidity.  She was cold and clammy but she was alert.  Hypotension was as low as 40 systolic but she did maintain mentation.  Mentation always was better and she perked up and the systolic was 70.  She did not respond to fluid bolus.  She did not respond to full dose Neo-Synephrine.  We changed her to Levophed at 10 mcg which was what was required for her blood pressure to respond.  She did get approximately 1-2 L of fluids.  She also got a bicarb push.  She also got hydrocortisone after sending off cortisol levels.  There was no blood from the chest tube or any active bleeding anywhere.  No complaints of chest pain.  The amiodarone was stopped.  EKG showed heart rate in the 90s with QTc 502 ms.  No acute changes.  Assessment/plan Circulatory shock unclear etiology  Plan - Stat labs including ABG, chemistry, lipase, troponin, coagulation, CBC, procalcitonin and lactic acid - Try to get PICC line urgently - Get CT abdomen -Levophed through peripheral vein - Hydrocortisone  According to the bedside nurse and the daughter is on the way   >45 min bedside ccm time     SIGNATURE    Dr. Brand Males, M.D., F.C.C.P,  Pulmonary and Critical Care Medicine Staff Physician, [place] Director - Interstitial Lung Disease  Program  Medical Director - [facility] [place] at [place], [place], [address]  NPI Number:  NPI #[id number] [place] Number: [id number]  Pager: [phone], If no answer  -> Check AMION or Try [phone] Telephone (clinical office): [phone] Telephone (research): [phone]  11:41 AM 10/25/2021

## 2021-10-25 NOTE — Progress Notes (Signed)
NAME:  [name], MRN:  [id number], DOB:  June 12, 1934, LOS: 2 ADMISSION DATE:  10/23/2021 CONSULTATION DATE:  10/23/2021 REFERRING MD:  Kommor - EDP CHIEF COMPLAINT:  Hypotension in the setting of Afib with RVR   History of Present Illness:  87 year old woman who presented to [facility] ED 9/6 post-mechanical fall, also with decreased PO intake/weakness x 5 days. Hit posterior head while getting out of the bathroom; no LOC. Not on AC. PMHx significant for HTN, HLD, COPD/OSA, T2DM, fibromuscular dysplasia, pseudogout.  Per chart review, patient had been feeling very weak and sustained a mechanical fall when ambulating back from the restroom. She struck her posterior head on the floor but did not have LOC. Denies recent fever/chills, CP/SOB, n/v/d or abdominal pain. Endorses HA s/p fall. EMS noted mild hypotension in the field, initially corrected after fluid administration.  On ED evaluation, afebrile with HR 100s, mildly tachypneic to 25, BP 129/62, SpO2 91% on RA. Labs notable for WBC/H&H WNL, Plt 572, lytes WNL, Cr 1.03 (baseline 0.8), normal AST/ALT, Tbili 1.3. Serum LDH 213, trop WNL. LA 1.6. COVID/Flu negative. CT Head/Cspine NAICA, no fracture. CXR with large L pleural effusion. CTA Chest negative for PE, large left pleural effusion noted with R mediastinal shift, occluded LLL bronchus, concern for poorly defined LLL mass.   PCCM consulted for chest tube placement/new LLL mass. Patient became hypotensive requiring Neosynephrine initiation and was admitted to ICU.  Pertinent Medical History:   Past Medical History:  Diagnosis Date   Allergic rhinitis    Arthritis    Complication of anesthesia    Hard time waking up   COPD (chronic obstructive pulmonary disease) ([place])    PFT 08/08/09-FEV1 1.98/107; R 0.77; small airway obst w/tresp to dilator;DLCO 44%   Diabetes mellitus    Fibromuscular dysplasia (HCC)    RAS   Hyperlipidemia    Hypertension    PONV (postoperative nausea and  vomiting)    Pseudogout    Sleep apnea    AHI 89/hr   Significant Hospital Events: Including procedures, antibiotic start and stop dates in addition to other pertinent events   9/6 - Presented to [facility] ED for mechanical fall with weakness, poor PO intake. CT Chest negative for PE, c/f new LLL mass and large L pleural effusion with R mediastinal shift. CT Head/Cspine negative. Chest tube placed. 9/7 intrapleural lytics administered dose 1  Interim History / Subjective:  750 out of chest tube yesterday after 1st dose intrapleural lytics. Feels a bit better this AM. Down to 2L O2. CXR pending.  Objective:  Blood pressure 128/78, pulse (!) 108, temperature 97.8 F (36.6 C), resp. rate 20, weight 61.2 kg, SpO2 96 %.        Intake/Output Summary (Last 24 hours) at 10/25/2021 0756 Last data filed at 10/25/2021 0700 Gross per 24 hour  Intake 1298.73 ml  Output 1450 ml  Net -151.27 ml    Filed Weights   10/24/21 0500 10/25/21 0400  Weight: 59.7 kg 61.2 kg    Physical Examination: General: Elderly female, resting in bed, in NAD. Neuro: A&O x 3, no deficits. HEENT: [place]/AT. Sclerae anicteric. EOMI. Cardiovascular: RRR, no M/R/G.  Lungs: Respirations even and unlabored.  Very slightly diminished left base, otherwise clear. Abdomen: BS x 4, soft, NT/ND.  Musculoskeletal: No gross deformities, no edema.  Skin: Intact, warm, no rashes.  Assessment & Plan:   Large left pleural effusion s/p chest tube placement 9/6 with over 2L output initially and now 750 past  24 hours after 1st dose of intrapleural lytics. Exudative per LDH with negative gram stain. Cytology pending. New LLL mass  CTA Chest 9/6 with large L pleural effusion noted with R mediastinal shift, occluded LLL bronchus, concern for poorly defined LLL mass.  Negative for PE. - Continue suction to -20 for now. - Great response to first dose intrapleural tPA/pulmozyme 9/7. Will follow up on CXR this AM and assess change, likely repeat  at least one if not two more days lytics in an attempt to dry things out as much as we can. - Repeat chest CT once more fluid out. - F/u pleural fluid studies including cytology. - Consider Oncology consult pending above.  Afib with RVR - Transition Amiodarone to PO. Continue x 4 weeks and if maintains NSR then can discontinue per cards. - Defer long term anticoagulation for now given that AF likely caused by above and she has sustained NSR since first converting back. - Maintain K > 4, Mg > 2 (Mg repleted this AM). - F/u on T3, free T4 (TSH low at 0.034).  Mechanical fall secondary to weakness - CT Head/Cspine negative for AICA/fracture. - PT recommending short term SNF.   Rest per primary team. PCCM will follow for chest tube.    Montey Hora, [place] [facility] For pager details, please see AMION or use Epic chat  After 1900, please call [facility] for cross coverage needs 10/25/2021, 7:56 AM

## 2021-10-25 NOTE — Progress Notes (Signed)
Peripherally Inserted Central Catheter Placement  The IV Nurse has discussed with the patient and/or persons authorized to consent for the patient, the purpose of this procedure and the potential benefits and risks involved with this procedure.  The benefits include less needle sticks, lab draws from the catheter, and the patient may be discharged home with the catheter. Risks include, but not limited to, infection, bleeding, blood clot (thrombus formation), and puncture of an artery; nerve damage and irregular heartbeat and possibility to perform a PICC exchange if needed/ordered by physician.  Alternatives to this procedure were also discussed.  Bard Power PICC patient education guide, fact sheet on infection prevention and patient information card has been provided to patient /or left at bedside.    PICC Placement Documentation  PICC Double Lumen 10/25/21 Right Brachial 37 cm 0 cm (Active)  Indication for Insertion or Continuance of Line Vasoactive infusions 10/25/21 1406  Exposed Catheter (cm) 0 cm 10/25/21 1406  Site Assessment Clean, Dry, Intact 10/25/21 1406  Lumen #1 Status Blood return noted;Flushed;Saline locked 10/25/21 1406  Lumen #2 Status Blood return noted;Saline locked;Flushed 10/25/21 1406  Dressing Type Transparent;Securing device 10/25/21 1406  Dressing Status Antimicrobial disc in place 10/25/21 1406  Safety Lock Not Applicable 28/00/34 9179  Dressing Change Due 11/01/21 10/25/21 1406       [name] 10/25/2021, 2:08 PM

## 2021-10-25 NOTE — Assessment & Plan Note (Signed)
Chest tube in place.  2.1L out on day 1, 1.4L out yesterday after lytics - Per Pulm, maintain to suction, repeat lytics, monitor output - Analgesics as needed

## 2021-10-25 NOTE — Assessment & Plan Note (Signed)
Need imaging after adequate drainage of effusion to delineate better. - Continue chest tube to suction - Repeat cross-sectional imaging later

## 2021-10-25 NOTE — Progress Notes (Signed)
OT Cancellation Note  Patient Details Name: [name] MRN: [id number] DOB: 09/06/1934   Cancelled Treatment:    Reason Eval/Treat Not Completed: Patient not medically ready. Now requiring pressors and increased testing from critical care. Will hold today and f/u as able.   Payne Garske L Shayley Medlin 10/25/2021, 2:41 PM

## 2021-10-25 NOTE — Assessment & Plan Note (Signed)
Blood pressure soft - Hold losartan and amlodipine

## 2021-10-25 NOTE — Assessment & Plan Note (Signed)
-   Supplement mag °

## 2021-10-25 NOTE — Progress Notes (Signed)
  Echocardiogram 2D Echocardiogram has been performed.  [name] 10/25/2021, 4:41 PM

## 2021-10-25 NOTE — Progress Notes (Signed)
PCCM Progress Note  Back in A.fib this AM, back on amio gtt. Labs and CT reviewed after ordered when suddenly developed hypotension.  K 3.3, repletion already ordered. Trop, lactate, Hgb all fine.  CT with improved effusion but still some there, thickening of descending colon likely 2/2 mild colitis, prominent stool in rectal vault.  Will add Flagyl '500mg'$  q8hrs. Add enema PRN. Hold off on further intrapleural lytics given her need for pressors etc earlier, would rather not cause further fluid shifts etc. Dr. Chase Caller to re-assess tomorrow. Continue Levophed, wean as able.   [name], [place] [facility] For pager details, please see AMION or use Epic chat  After 1900, please call [facility] for cross coverage needs 10/25/2021, 4:38 PM

## 2021-10-25 NOTE — Hospital Course (Addendum)
87 y.o.f with COPD not on home O2, OSA on CPAP, HTN, DM, HLD, hx aortic atherosclerosis, possible chronic descending aortic dissection, and hx renal artery bypass, hx pituitary adenoma s/p resection 2004, 2009, and 2010, hx CPPD, OA and chronic low back pain not on daily opiates who presented with few days weakness then a fall.EMS found her hypotensive. In the ER, CXR showed new large left effusion, CTA chest confirmed large left-sided effusion with consolidative mass.  Patient developed Afib with RVR. 9/6: Admitted to ICU on neo, amio, chest tube placed PCCM consulted for chest tube placement/new LLL mass. Patient became hypotensive requiring Neosynephrine initiation and was admitted to ICU.She has been weaned off vasopressors. She currently has chest tube placed on 10/23/21. Repeat CXR shows persistent loculated pleural effusion. Follow up CT of the chest  shows Moderate-to-large size loculated left pleural effusion has only minimally decreased in size status post pleural drainage catheter placement. Existing pleural catheter replaced and fibrinolytic administered  on 10/31/21 by PCCM> pigtail catheter removed 9/17.  Family meeting held with patient care patient/daughter has elected comfort measures.

## 2021-10-25 NOTE — Progress Notes (Signed)
Rounding Note    Patient Name: [name] Date of Encounter: 10/25/2021   [facility] Cardiologist: Candee Furbish, MD   Subjective   After several hours of sinus rhythm yesterday, she reverted back to atrial fibrillation.  She feels poor.  Inpatient Medications    Scheduled Meds:  alteplase (CATHFLO ACTIVASE) 10 mg in sodium chloride (PF) 0.9 % 30 mL  10 mg Intrapleural Once   And   dornase alfa (PULMOZYME) 5 mg in sterile water (preservative free) 30 mL  5 mg Intrapleural Once   Chlorhexidine Gluconate Cloth  6 each Topical Daily   enoxaparin (LOVENOX) injection  40 mg Subcutaneous Q24H   sodium chloride flush  10 mL Intrapleural Q8H   sodium chloride flush  10 mL Intrapleural Q8H   Continuous Infusions:  sodium chloride Stopped (10/25/21 0000)   sodium chloride Stopped (10/24/21 1448)   amiodarone     PRN Meds: acetaminophen, calcium carbonate, docusate sodium, lip balm, mouth rinse, polyethylene glycol   Vital Signs    Vitals:   10/25/21 0600 10/25/21 0700 10/25/21 0800 10/25/21 0859  BP: 128/78  124/74 90/60  Pulse: (!) 108 90 (!) 102 (!) 119  Resp: 20 19 (!) 24 (!) 29  Temp:   (!) 97.1 F (36.2 C)   TempSrc:   Oral   SpO2: 96% 95% 95% 92%  Weight:        Intake/Output Summary (Last 24 hours) at 10/25/2021 1001 Last data filed at 10/25/2021 0830 Gross per 24 hour  Intake 1321.39 ml  Output 1450 ml  Net -128.61 ml      10/25/2021    4:00 AM 10/24/2021    5:00 AM 02/23/2020    3:12 PM  Last 3 Weights  Weight (lbs) 134 lb 14.7 oz 131 lb 9.8 oz 147 lb  Weight (kg) 61.2 kg 59.7 kg 66.679 kg      Telemetry    Atrial fibrillation heart rates 100-110- Personally Reviewed  ECG    No new- Personally Reviewed  Physical Exam   GEN: No acute distress.  Frail Neck: No JVD Cardiac: Regularly irregular with 2/6 systolic murmur no rubs, or gallops.  Respiratory: Decreased breath sounds left. GI: Soft, nontender, non-distended  MS: No edema; No  deformity. Neuro:  Nonfocal  Psych: Normal affect   Labs    High Sensitivity Troponin:   Recent Labs  Lab 10/23/21 1545  TROPONINIHS 9     Chemistry Recent Labs  Lab 10/23/21 1545 10/24/21 0008  NA 140 138  K 3.7 3.9  CL 103 104  CO2 22 20*  GLUCOSE 90 105*  BUN 21 19  CREATININE 1.03* 0.89  CALCIUM 9.5 8.8*  MG  --  1.6*  PROT 7.0  7.1  --   ALBUMIN 3.2*  --   AST 16  --   ALT 9  --   ALKPHOS 115  --   BILITOT 1.3*  --   GFRNONAA 53* >60  ANIONGAP 15 14    Lipids No results for input(s): "CHOL", "TRIG", "HDL", "LABVLDL", "LDLCALC", "CHOLHDL" in the last 168 hours.  Hematology Recent Labs  Lab 10/23/21 1545 10/24/21 0008  WBC 10.3 9.6  RBC 4.83 4.40  HGB 13.6 12.4  HCT 43.4 40.6  MCV 89.9 92.3  MCH 28.2 28.2  MCHC 31.3 30.5  RDW 15.8* 15.7*  PLT 572* 479*   Thyroid  Recent Labs  Lab 10/24/21 1037  TSH 0.034*    BNPNo results for input(s): "BNP", "PROBNP"  in the last 168 hours.  DDimer No results for input(s): "DDIMER" in the last 168 hours.   Radiology    [place] EKG SITE RITE  Result Date: 10/25/2021 If Site Rite image not attached, placement could not be confirmed due to current cardiac rhythm.  DG Chest Port 1 View  Result Date: 10/25/2021 CLINICAL DATA:  Pleural effusion on left. EXAM: PORTABLE CHEST 1 VIEW COMPARISON:  Chest x-ray dated October 24, 2021 FINDINGS: Visualized cardiac and mediastinal contours are unchanged, including left hilar fullness. Small left pleural effusion is slightly decreased in size when compared with prior exam. Left-sided chest tube in place. Bibasilar atelectasis. No evidence of pneumothorax. IMPRESSION: Small left pleural effusion is slightly decreased in size when compared to prior exam. Left-sided chest tube in place. Electronically Signed   By: Yetta Glassman M.D.   On: 10/25/2021 08:51   DG Chest Port 1 View  Result Date: 10/24/2021 CLINICAL DATA:  [id number].  Chest tube and left pleural effusion. EXAM: PORTABLE  CHEST 1 VIEW COMPARISON:  Portable chest yesterday at 7:16 p.m. FINDINGS: 4:30 a.m. Pigtail left chest tube positioning is unaltered. There is no visible pneumothorax. Moderate-sized left pleural effusion is noted with mild improvement, previously extending to the mid hilar level whereas now the meniscus is at the level of the inferior hilum. There may be some loculated fluid about the hilum, most likely in the fissure if present. There is left basilar overlying consolidation or atelectasis with the bilateral lungs elsewhere clear. There is cardiomegaly without vascular congestion. The aorta is tortuous and calcified. Osteopenia and thoracic degenerative changes. Kyphoplasty cement at T11. IMPRESSION: Moderate left pleural effusion with improvement. Stable left chest tube positioning with no visible pneumothorax. Query left perihilar loculated fluid in the fissure, with left basilar consolidation or atelectasis. Aortic atherosclerosis. Electronically Signed   By: Telford Nab M.D.   On: 10/24/2021 07:05   DG Chest 1 View  Result Date: 10/23/2021 CLINICAL DATA:  [id number] EXAM: CHEST  1 VIEW COMPARISON:  Chest x-ray 10/23/2021 FINDINGS: The heart and mediastinal contours are grossly within normal limits with silhouetting off of the left ventricle due to lung disease. Atherosclerotic plaque. Interval placement of a left chest tube with pigtail overlying the mid to lower left lung zone. Slightly improved aeration of the left upper lobe. No pulmonary edema. Slight interval decrease in size of an at least moderate sized left pleural effusion. No pneumothorax. No acute osseous abnormality. Degenerative changes of the right shoulder. IMPRESSION: 1. Interval placement of a left chest tube in appropriate position with slight interval decrease in size of an at least moderate volume left pleural effusion. 2.  Aortic Atherosclerosis (ICD10-I70.0). Electronically Signed   By: Iven Finn M.D.   On: 10/23/2021 19:34   CT  Angio Chest PE W and/or Wo Contrast  Result Date: 10/23/2021 CLINICAL DATA:  History of fall abnormal chest x-ray EXAM: CT ANGIOGRAPHY CHEST WITH CONTRAST TECHNIQUE: Multidetector CT imaging of the chest was performed using the standard protocol during bolus administration of intravenous contrast. Multiplanar CT image reconstructions and MIPs were obtained to evaluate the vascular anatomy. RADIATION DOSE REDUCTION: This exam was performed according to the departmental dose-optimization program which includes automated exposure control, adjustment of the mA and/or kV according to patient size and/or use of iterative reconstruction technique. CONTRAST:  61m OMNIPAQUE IOHEXOL 350 MG/ML SOLN COMPARISON:  Chest x-ray 10/23/2021, 02/07/2020, CT chest 03/16/2016, CT 10/01/2012 FINDINGS: Cardiovascular: Satisfactory opacification of the pulmonary arteries to the segmental level. No  acute pulmonary embolism is visualized. Advanced aortic calcification. Poor contrast opacification of the aortic arch and descending thoracic aorta. Chronically displaced wall calcifications at the mid descending thoracic aorta, maximum aortic diameter of 3.6 cm at this level. Coronary vascular calcifications. Normal cardiac size. No pericardial effusion. Mediastinum/Nodes: Midline trachea. Heterogeneous enlarged left lobe of thyroid with multiple nodules, unchanged. Stability for greater than 5 years implies benignity; no biopsy or followup indicated (ref: J Am Coll Radiol. 2015 Feb;12(2): 143-50). Mildly enlarged right paratracheal node measuring 11 mm, series 7, image 53. Upper esophagus contains a small amount of debris. The distal esophagus is decompressed. There is ill-defined soft tissue density within the subcarinal region that is difficult to separate from the decompressed esophagus. This appears contiguous with soft tissue density surrounding the left lower lobe bronchus. Mass effect on the left atrium. Lungs/Pleura: Emphysema. Large  left-sided pleural effusion with shift of mediastinal contents to the right. Atelectasis of most of the left lung. Heterogeneous hypodense areas within left lower lobe consolidative process, series 7, image 91 suggestive of necrosis. Left lower lobe consolidative process cannot be separated from the soft tissue abnormality surrounding the left lower lobe bronchus and extending into the mediastinum. Delayed images of the lower chest demonstrate a heterogenous enhancing mass abutting or arising from the left posterior pleural surface, this measures 7 by 3.6 cm, series 10, image 26. Suspicion of additional pleural based lesion posterior medially at the level of the distal arch, series 7 image 49. mild irregular narrowing of left upper lobe bronchus as well. Upper Abdomen: No acute process in the upper abdomen. Stable 1.4 cm left adrenal mass, likely representing adenoma given long-term stability, no follow-up imaging is recommended. Chronic mild aneurysmal dilatation of the left renal artery. Musculoskeletal: No acute osseous abnormality Review of the MIP images confirms the above findings. IMPRESSION: 1. Negative for acute pulmonary embolus. 2. Large left-sided pleural effusion with shift of mediastinal contents to the right. Atelectasis of most of the left lung. Occluded left lower lobe bronchus. Poorly defined soft tissue density/suspected hilar/infrahilar and left lower lobe mass, poorly separated from ill-defined subcarinal soft tissue density. This exerts mass effect on the left atrium. Findings concerning for malignancy. Mass or consolidation in the left lower lobe demonstrates heterogenous hypodensities suggesting necrosis. Pulmonary consultation is recommended. 3. Additional finding of heterogenous enhancing left lower lobe mass measuring up to 7 cm either abutting or arising from the pleural surface, concerning for metastatic lesion. Suspicion of additional probable pleural based mass slightly more superior  at the level of the distal arch. Critical Value/emergent results were called by telephone at the time of interpretation on 10/23/2021 at 6:54 pm to provider [facility] , who verbally acknowledged these results. Aortic Atherosclerosis (ICD10-I70.0) and Emphysema (ICD10-J43.9). Electronically Signed   By: Donavan Foil M.D.   On: 10/23/2021 18:54   CT HEAD WO CONTRAST (5MM)  Result Date: 10/23/2021 CLINICAL DATA:  Fall EXAM: CT HEAD WITHOUT CONTRAST CT CERVICAL SPINE WITHOUT CONTRAST TECHNIQUE: Multidetector CT imaging of the head and cervical spine was performed following the standard protocol without intravenous contrast. Multiplanar CT image reconstructions of the cervical spine were also generated. RADIATION DOSE REDUCTION: This exam was performed according to the departmental dose-optimization program which includes automated exposure control, adjustment of the mA and/or kV according to patient size and/or use of iterative reconstruction technique. COMPARISON:  Brain MRI 07/02/2017 and cervical spine CT 03/16/2016 FINDINGS: CT HEAD FINDINGS Brain: There is no acute intracranial hemorrhage, extra-axial fluid collection, or  acute infarct. Background parenchymal volume is normal for age. The ventricles are normal in size. Gray-white differentiation is preserved. There is no mass lesion. There is no mass effect or midline shift. Vascular: There is calcification of the bilateral carotid siphons. Skull: Normal. Negative for fracture or focal lesion. Sinuses/Orbits: The imaged paranasal sinuses are clear. Bilateral lens implants are in place. The globes and orbits are otherwise unremarkable. Other: None. CT CERVICAL SPINE FINDINGS Alignment: Trace anterolisthesis of C3 on C4 and C4 on C5 and trace retrolisthesis of C5 on C6 is unchanged since 2018. There is no jumped or perched facet or other evidence of traumatic malalignment. Skull base and vertebrae: Skull base alignment is maintained. Vertebral body heights are  preserved. There is no evidence of acute fracture. There is no suspicious osseous lesion. Soft tissues and spinal canal: No prevertebral fluid or swelling. No visible canal hematoma. Disc levels: Disc space narrowing and degenerative endplate change most advanced at C5-C6. Facet arthropathy is most advanced at C2-C3 bilaterally and on the left at C3-C4. Upper chest: The lungs are assessed on the separately dictated CTA chest. Other: None. IMPRESSION: 1. No acute intracranial pathology. 2. No acute fracture or traumatic malalignment of the cervical spine. Electronically Signed   By: Valetta Mole M.D.   On: 10/23/2021 18:28   CT Cervical Spine Wo Contrast  Result Date: 10/23/2021 CLINICAL DATA:  Fall EXAM: CT HEAD WITHOUT CONTRAST CT CERVICAL SPINE WITHOUT CONTRAST TECHNIQUE: Multidetector CT imaging of the head and cervical spine was performed following the standard protocol without intravenous contrast. Multiplanar CT image reconstructions of the cervical spine were also generated. RADIATION DOSE REDUCTION: This exam was performed according to the departmental dose-optimization program which includes automated exposure control, adjustment of the mA and/or kV according to patient size and/or use of iterative reconstruction technique. COMPARISON:  Brain MRI 07/02/2017 and cervical spine CT 03/16/2016 FINDINGS: CT HEAD FINDINGS Brain: There is no acute intracranial hemorrhage, extra-axial fluid collection, or acute infarct. Background parenchymal volume is normal for age. The ventricles are normal in size. Gray-white differentiation is preserved. There is no mass lesion. There is no mass effect or midline shift. Vascular: There is calcification of the bilateral carotid siphons. Skull: Normal. Negative for fracture or focal lesion. Sinuses/Orbits: The imaged paranasal sinuses are clear. Bilateral lens implants are in place. The globes and orbits are otherwise unremarkable. Other: None. CT CERVICAL SPINE FINDINGS  Alignment: Trace anterolisthesis of C3 on C4 and C4 on C5 and trace retrolisthesis of C5 on C6 is unchanged since 2018. There is no jumped or perched facet or other evidence of traumatic malalignment. Skull base and vertebrae: Skull base alignment is maintained. Vertebral body heights are preserved. There is no evidence of acute fracture. There is no suspicious osseous lesion. Soft tissues and spinal canal: No prevertebral fluid or swelling. No visible canal hematoma. Disc levels: Disc space narrowing and degenerative endplate change most advanced at C5-C6. Facet arthropathy is most advanced at C2-C3 bilaterally and on the left at C3-C4. Upper chest: The lungs are assessed on the separately dictated CTA chest. Other: None. IMPRESSION: 1. No acute intracranial pathology. 2. No acute fracture or traumatic malalignment of the cervical spine. Electronically Signed   By: Valetta Mole M.D.   On: 10/23/2021 18:28   DG Chest 2 View  Result Date: 10/23/2021 CLINICAL DATA:  Trauma, fall, difficulty breathing EXAM: CHEST - 2 VIEW COMPARISON:  02/07/2020 FINDINGS: Transplant diameter heart is increased. There is almost complete opacification of left  hemithorax. Findings suggest large left pleural effusion. Evaluation of right lung for infiltrates is limited by the large effusion. In view of history of fall, possibility of hemothorax is not excluded. Right lung is clear. Right lateral CP angle is clear. There is no pneumothorax. There is evidence of previous vertebroplasty in lower thoracic spine. Degenerative changes are noted in both shoulders. IMPRESSION: There is interval appearance of large left pleural effusion. This may suggest parapneumonic effusion or hemothorax or neoplastic process. Follow-up CT chest may be considered. Electronically Signed   By: Elmer Picker M.D.   On: 10/23/2021 16:12    Cardiac Studies   ECHO: 2018 - Left ventricle: The cavity size was normal. Systolic function was    vigorous. The  estimated ejection fraction was in the range of 65%    to 70%. Wall motion was normal; there were no regional wall    motion abnormalities. Doppler parameters are consistent with    abnormal left ventricular relaxation (grade 1 diastolic    dysfunction). Doppler parameters are consistent with    indeterminate ventricular filling pressure.  - Aortic valve: Valve mobility was restricted. There was moderate    stenosis. There was no regurgitation. Peak velocity (S): 381    cm/s. Mean gradient (S): 35 mm Hg.  - Mitral valve: Calcified annulus. Transvalvular velocity was    within the normal range. There was no evidence for stenosis.    There was trivial regurgitation.  - Right ventricle: The cavity size was normal. Wall thickness was    normal. Systolic function was normal.  - Atrial septum: No defect or patent foramen ovale was identified    by color flow Doppler.  - Tricuspid valve: There was mild regurgitation.  - Pulmonary arteries: Systolic pressure was mildly increased. PA    peak pressure: 41 mm Hg (S).    R/L HEART CATH: 06/2011 Hemodynamics:                                     RA 2                                     RV 32/3                                     PA 34/11  mean 22                                     PCWP Mean 11                                     LV 161/18                                     AO 146/96                                     AV area  1.74  AV gradient (mean)  6.72             Oxygen saturations:                                     PA 64                                     AO 95               Cardiac Output (Fick) 4.2                               Cardiac Index (Fick) 2.2              Coronary angiography:   Coronary dominance: Right   Left mainstem: Normal   Left anterior descending (LAD): Mild luminal irregularities.  Small diagonals normal.   Left circumflex (LCx): RI large and normal.  AV groove mild  luminal irregularities   Right coronary artery (RCA): Normal.  PDA large normal.  PL small to moderate and normal.   Left ventriculography: Left ventricular systolic function is normal, LVEF is estimated at 55-65%, there is no significant mitral regurgitation    Final Conclusions:  Normal coronaries.  NL LF function.  Only mild AS by this measurement.  Echo suggests severe.  Pulmonary pressures and EDP are not elevated.   Recommendations:  Based on this the aortic valve does not appear to be the etiology for her dypsnea.  No further cardiac work up.       Minus Breeding 06/27/2011, 11:13 AM  Patient Profile     87 y.o. female with atrial fibrillation paroxysmal, left pleural effusion exudative recent mechanical fall secondary to weakness dehydration, left lower lobe mass  Assessment & Plan    Atrial fibrillation - Since atrial fibrillation has returned, would normally encourage use of anticoagulation however she did just receive lytics via chest tube.  Risk of bleeding outweighs benefits at this point.  Frail. -Okay to resume IV amiodarone.  Hopeful conversion soon  Moderate aortic stenosis - Mean gradient 35 mmHg, not currently a candidate for TAVR  Pleural effusion and new left lower lobe lung mass - Per primary team    For questions or updates, please contact [place] Please consult [email or web address] for contact info under        Signed, Candee Furbish, MD  10/25/2021, 10:01 AM

## 2021-10-25 NOTE — Assessment & Plan Note (Signed)
Diet controlled, last A1c less than 6 off medications. - Check CBG twice daily

## 2021-10-25 NOTE — Assessment & Plan Note (Signed)
Blood pressure soft in the setting of A-fib - Give fluids, resume amiodarone infusion

## 2021-10-25 NOTE — Assessment & Plan Note (Addendum)
New onset.  CHA2DS2-Vasc would be 6 (age 87, gender 1, PVD 1, HTN 1, DM 1).    Started on amiodarone gtt in the ER, converted to sinus.  Transitioned to PO this morning and reverted to fast Afib with stopping infusion. - Hold AC until plan for lung "mass" clearer - Resume amiodarone infusion - Supplement low Mag - Supp K above 4

## 2021-10-25 NOTE — Assessment & Plan Note (Signed)
-   CPAP at night °

## 2021-10-26 DIAGNOSIS — I350 Nonrheumatic aortic (valve) stenosis: Secondary | ICD-10-CM

## 2021-10-26 DIAGNOSIS — R579 Shock, unspecified: Secondary | ICD-10-CM | POA: Diagnosis not present

## 2021-10-26 DIAGNOSIS — J90 Pleural effusion, not elsewhere classified: Secondary | ICD-10-CM | POA: Diagnosis not present

## 2021-10-26 DIAGNOSIS — I10 Essential (primary) hypertension: Secondary | ICD-10-CM | POA: Diagnosis not present

## 2021-10-26 DIAGNOSIS — J9601 Acute respiratory failure with hypoxia: Secondary | ICD-10-CM | POA: Diagnosis not present

## 2021-10-26 DIAGNOSIS — I480 Paroxysmal atrial fibrillation: Secondary | ICD-10-CM | POA: Diagnosis not present

## 2021-10-26 LAB — TROPONIN I (HIGH SENSITIVITY): Troponin I (High Sensitivity): 12 ng/L (ref <18)

## 2021-10-26 LAB — CORTISOL-AM, BLOOD: Cortisol - AM: >100 ug/dL — ABNORMAL HIGH (ref 6.7–22.6)

## 2021-10-26 LAB — COMPREHENSIVE METABOLIC PANEL
ALT: 7 U/L (ref 0–44)
AST: 12 U/L — ABNORMAL LOW (ref 15–41)
Albumin: 2.3 g/dL — ABNORMAL LOW (ref 3.5–5.0)
Alkaline Phosphatase: 112 U/L (ref 38–126)
Anion gap: 9 (ref 5–15)
BUN: 29 mg/dL — ABNORMAL HIGH (ref 8–23)
CO2: 24 mmol/L (ref 22–32)
Calcium: 8.4 mg/dL — ABNORMAL LOW (ref 8.9–10.3)
Chloride: 99 mmol/L (ref 98–111)
Creatinine, Ser: 1.04 mg/dL — ABNORMAL HIGH (ref 0.44–1.00)
GFR, Estimated: 53 mL/min — ABNORMAL LOW (ref >60)
Glucose, Bld: 167 mg/dL — ABNORMAL HIGH (ref 70–99)
Potassium: 3.8 mmol/L (ref 3.5–5.1)
Sodium: 132 mmol/L — ABNORMAL LOW (ref 135–145)
Total Bilirubin: 0.7 mg/dL (ref 0.3–1.2)
Total Protein: 5.3 g/dL — ABNORMAL LOW (ref 6.5–8.1)

## 2021-10-26 LAB — T4, FREE: Free T4: 1.49 ng/dL — ABNORMAL HIGH (ref 0.61–1.12)

## 2021-10-26 LAB — GLUCOSE, CAPILLARY
Glucose-Capillary: 173 mg/dL — ABNORMAL HIGH (ref 70–99)
Glucose-Capillary: 237 mg/dL — ABNORMAL HIGH (ref 70–99)

## 2021-10-26 LAB — PROCALCITONIN: Procalcitonin: 1.14 ng/mL

## 2021-10-26 LAB — ACTH: C206 ACTH: 27.2 pg/mL (ref 7.2–63.3)

## 2021-10-26 LAB — MAGNESIUM: Magnesium: 2 mg/dL (ref 1.7–2.4)

## 2021-10-26 LAB — STREP PNEUMONIAE URINARY ANTIGEN: Strep Pneumo Urinary Antigen: NEGATIVE

## 2021-10-26 MED ORDER — LACTATED RINGERS IV BOLUS
500.0000 mL | Freq: Once | INTRAVENOUS | Status: AC
  Administered 2021-10-26: 500 mL via INTRAVENOUS

## 2021-10-26 MED ORDER — SODIUM CHLORIDE 0.9 % IV SOLN
2.0000 g | Freq: Two times a day (BID) | INTRAVENOUS | Status: AC
  Administered 2021-10-26 – 2021-11-02 (×14): 2 g via INTRAVENOUS
  Filled 2021-10-26 (×14): qty 12.5

## 2021-10-26 MED ORDER — AMIODARONE HCL 200 MG PO TABS
200.0000 mg | ORAL_TABLET | Freq: Every day | ORAL | Status: DC
  Administered 2021-10-26 – 2021-10-27 (×2): 200 mg via ORAL
  Filled 2021-10-26 (×2): qty 1

## 2021-10-26 NOTE — Progress Notes (Addendum)
Rounding Note    Patient Name: [name] Date of Encounter: 10/26/2021  [facility] Cardiologist: Candee Furbish, MD   Subjective   Converted to NSR yesterday. Amio stopped due to hypotension.   Heart rate remains in the 80s.  Blood pressure 108/43 mmHg on low dose Levo gtt Inpatient Medications    Scheduled Meds:  alteplase (CATHFLO ACTIVASE) 10 mg in sodium chloride (PF) 0.9 % 30 mL  10 mg Intrapleural Once   And   dornase alfa (PULMOZYME) 5 mg in sterile water (preservative free) 30 mL  5 mg Intrapleural Once   atorvastatin  80 mg Oral QHS   Chlorhexidine Gluconate Cloth  6 each Topical Daily   enoxaparin (LOVENOX) injection  40 mg Subcutaneous Q24H   hydrocortisone sod succinate (SOLU-CORTEF) inj  100 mg Intravenous Q12H   sodium chloride flush  10 mL Intrapleural Q8H   sodium chloride flush  10 mL Intrapleural Q8H   sodium chloride flush  10-40 mL Intracatheter Q12H   Continuous Infusions:  sodium chloride Stopped (10/25/21 0000)   sodium chloride Stopped (10/24/21 1448)   sodium chloride     amiodarone Stopped (10/25/21 1015)   metronidazole Stopped (10/26/21 0539)   norepinephrine (LEVOPHED) Adult infusion 2 mcg/min (10/26/21 0600)   PRN Meds: acetaminophen, calcium carbonate, docusate sodium, docusate sodium, iohexol, lip balm, mouth rinse, polyethylene glycol, sodium chloride flush   Vital Signs    Vitals:   10/26/21 0445 10/26/21 0500 10/26/21 0600 10/26/21 0615  BP: (!) 107/49 (!) 104/50 (!) 106/46 (!) 108/43  Pulse: 85 84 83 86  Resp: '20 16 13 13  '$ Temp:      TempSrc:      SpO2: 95% 96% 95% 97%  Weight:        Intake/Output Summary (Last 24 hours) at 10/26/2021 0646 Last data filed at 10/26/2021 0600 Gross per 24 hour  Intake 1588.04 ml  Output 500 ml  Net 1088.04 ml       10/26/2021    4:00 AM 10/25/2021    4:00 AM 10/24/2021    5:00 AM  Last 3 Weights  Weight (lbs) 132 lb 4.4 oz 134 lb 14.7 oz 131 lb 9.8 oz  Weight (kg) 60 kg  61.2 kg 59.7 kg      Telemetry    NSR- Personally Reviewed  ECG    No new- Personally Reviewed  Physical Exam   GEN: Well nourished, well developed in no acute distress HEENT: Normal NECK: No JVD; No carotid bruits LYMPHATICS: No lymphadenopathy CARDIAC: RRR, no rubs, gallops, 2/6 systolic murmur at right upper sternal border RESPIRATORY:  Clear to auscultation without rales, wheezing or rhonchi  ABDOMEN: Soft, non-tender, non-distended MUSCULOSKELETAL:  No edema; No deformity  SKIN: Warm and dry NEUROLOGIC:  Alert and oriented x 3 PSYCHIATRIC:  Normal affect   Labs    High Sensitivity Troponin:   Recent Labs  Lab 10/23/21 1545 10/25/21 1048 10/25/21 1248 10/26/21 0422  TROPONINIHS 9 20* 19* 12      Chemistry Recent Labs  Lab 10/23/21 1545 10/24/21 0008 10/25/21 1048 10/26/21 0422  NA 140 138 132* 132*  K 3.7 3.9 3.3* 3.8  CL 103 104 100 99  CO2 22 20* 21* 24  GLUCOSE 90 105* 210* 167*  BUN 21 19 24* 29*  CREATININE 1.03* 0.89 0.99 1.04*  CALCIUM 9.5 8.8* 8.7* 8.4*  MG  --  1.6*  --  2.0  PROT 7.0  7.1  --  5.3* 5.3*  ALBUMIN 3.2*  --  2.2* 2.3*  AST 16  --  12* 12*  ALT 9  --  8 7  ALKPHOS 115  --  92 112  BILITOT 1.3*  --  0.6 0.7  GFRNONAA 53* >60 56* 53*  ANIONGAP '15 14 11 9     '$ Lipids No results for input(s): "CHOL", "TRIG", "HDL", "LABVLDL", "LDLCALC", "CHOLHDL" in the last 168 hours.  Hematology Recent Labs  Lab 10/23/21 1545 10/24/21 0008 10/25/21 1048  WBC 10.3 9.6 12.8*  RBC 4.83 4.40 4.80  HGB 13.6 12.4 13.5  HCT 43.4 40.6 42.8  MCV 89.9 92.3 89.2  MCH 28.2 28.2 28.1  MCHC 31.3 30.5 31.5  RDW 15.8* 15.7* 15.6*  PLT 572* 479* 626*    Thyroid  Recent Labs  Lab 10/24/21 1037  TSH 0.034*     BNP Recent Labs  Lab 10/25/21 1048  BNP 510.4*    DDimer No results for input(s): "DDIMER" in the last 168 hours.   Radiology    DG CHEST PORT 1 VIEW  Result Date: 10/25/2021 CLINICAL DATA:  Pleural effusion history of COPD  EXAM: PORTABLE CHEST 1 VIEW COMPARISON:  10/25/2021, 10/24/2021, 10/23/2021, CT chest 10/23/2021 FINDINGS: Left-sided chest tube remains in place. Residual small moderate left effusion and small right effusion. Persistent consolidation and airspace disease at left base. Left hilar enlargement no discrete left pneumothorax. Right upper extremity central venous catheter tip at the cavoatrial junction. IMPRESSION: 1. Similar small moderate left effusion and airspace disease at left lung base. 2. Trace right pleural effusion Electronically Signed   By: Donavan Foil M.D.   On: 10/25/2021 18:08   ECHOCARDIOGRAM COMPLETE  Result Date: 10/25/2021    ECHOCARDIOGRAM REPORT   Patient Name:   [name] Date of Exam: 10/25/2021 Medical Rec #:  [id number]            Height:       63.0 in Accession #:    [id number]           Weight:       134.9 lb Date of Birth:  10/24/1934            BSA:          1.636 m Patient Age:    87 years             BP:           122/53 mmHg Patient Gender: F                    HR:           86 bpm. Exam Location:  Inpatient Procedure: 2D Echo and Intracardiac Opacification Agent Indications:    AFIB  History:        Patient has prior history of Echocardiogram examinations, most                 recent 06/03/2016. Arrythmias:Atrial Fibrillation; Risk                 Factors:Hypertension and Diabetes.  Sonographer:    Harvie Junior Referring Phys: [phone] MURALI RAMASWAMY  Sonographer Comments: Technically difficult study due to poor echo windows, suboptimal apical window and suboptimal parasternal window. Image acquisition challenging due to COPD, Image acquisition challenging due to respiratory motion and patient supine. IMPRESSIONS  1. Calcified aortic valve with concern for low flow, low gradient severe AS (mean gradient 22 mmHg, AVA 0.7 cm2 and DI 0.24).  2. Left  ventricular ejection fraction, by estimation, is >75%. The left ventricle has hyperdynamic function. The left ventricle has no  regional wall motion abnormalities. There is moderate concentric left ventricular hypertrophy. Left ventricular diastolic parameters are consistent with Grade I diastolic dysfunction (impaired relaxation).  3. Right ventricular systolic function is normal. The right ventricular size is normal. There is mildly elevated pulmonary artery systolic pressure.  4. The mitral valve is normal in structure. Trivial mitral valve regurgitation. No evidence of mitral stenosis.  5. The aortic valve is calcified. Aortic valve regurgitation is trivial. Severe aortic valve stenosis.  6. The inferior vena cava is normal in size with greater than 50% respiratory variability, suggesting right atrial pressure of 3 mmHg. Comparison(s): No prior Echocardiogram. FINDINGS  Left Ventricle: Left ventricular ejection fraction, by estimation, is >75%. The left ventricle has hyperdynamic function. The left ventricle has no regional wall motion abnormalities. Definity contrast agent was given IV to delineate the left ventricular endocardial borders. The left ventricular internal cavity size was normal in size. There is moderate concentric left ventricular hypertrophy. Left ventricular diastolic parameters are consistent with Grade I diastolic dysfunction (impaired relaxation). Right Ventricle: The right ventricular size is normal. Right ventricular systolic function is normal. There is mildly elevated pulmonary artery systolic pressure. The tricuspid regurgitant velocity is 3.01 m/s, and with an assumed right atrial pressure of 8 mmHg, the estimated right ventricular systolic pressure is 02.6 mmHg. Left Atrium: Left atrial size was normal in size. Right Atrium: Right atrial size was normal in size. Pericardium: There is no evidence of pericardial effusion. Mitral Valve: The mitral valve is normal in structure. Mild mitral annular calcification. Trivial mitral valve regurgitation. No evidence of mitral valve stenosis. Tricuspid Valve: The  tricuspid valve is normal in structure. Tricuspid valve regurgitation is mild . No evidence of tricuspid stenosis. Aortic Valve: The aortic valve is calcified. Aortic valve regurgitation is trivial. Aortic regurgitation PHT measures 513 msec. Severe aortic stenosis is present. Aortic valve mean gradient measures 21.5 mmHg. Aortic valve peak gradient measures 34.0 mmHg. Aortic valve area, by VTI measures 0.74 cm. Pulmonic Valve: The pulmonic valve was normal in structure. Pulmonic valve regurgitation is not visualized. No evidence of pulmonic stenosis. Aorta: The aortic root is normal in size and structure. Venous: The inferior vena cava is normal in size with greater than 50% respiratory variability, suggesting right atrial pressure of 3 mmHg. IAS/Shunts: No atrial level shunt detected by color flow Doppler. Additional Comments: Calcified aortic valve with concern for low flow, low gradient severe AS (mean gradient 22 mmHg, AVA 0.7 cm2 and DI 0.24).  LEFT VENTRICLE PLAX 2D LVIDd:         2.25 cm     Diastology LVIDs:         1.50 cm     LV e' medial:    4.03 cm/s LV PW:         1.20 cm     LV E/e' medial:  13.3 LV IVS:        1.20 cm     LV e' lateral:   3.59 cm/s LVOT diam:     2.00 cm     LV E/e' lateral: 14.9 LV SV:         32 LV SV Index:   19 LVOT Area:     3.14 cm  LV Volumes (MOD) LV vol d, MOD A2C: 26.3 ml LV vol d, MOD A4C: 35.7 ml LV vol s, MOD A2C: 4.7 ml LV vol  s, MOD A4C: 8.9 ml LV SV MOD A2C:     21.6 ml LV SV MOD A4C:     35.7 ml LV SV MOD BP:      24.2 ml RIGHT VENTRICLE RV S prime:     6.64 cm/s TAPSE (M-mode): 1.0 cm LEFT ATRIUM             Index LA diam:        2.60 cm 1.59 cm/m LA Vol (A2C):   17.7 ml 10.82 ml/m LA Vol (A4C):   18.5 ml 11.31 ml/m LA Biplane Vol: 18.0 ml 11.00 ml/m  AORTIC VALVE                     PULMONIC VALVE AV Area (Vmax):    0.67 cm      PV Vmax:       2.31 m/s AV Area (Vmean):   0.66 cm      PV Peak grad:  21.3 mmHg AV Area (VTI):     0.74 cm AV Vmax:            291.75 cm/s AV Vmean:          189.333 cm/s AV VTI:            0.428 m AV Peak Grad:      34.0 mmHg AV Mean Grad:      21.5 mmHg LVOT Vmax:         62.10 cm/s LVOT Vmean:        39.800 cm/s LVOT VTI:          0.101 m LVOT/AV VTI ratio: 0.24 AI PHT:            513 msec  AORTA Ao Root diam: 3.00 cm Ao Asc diam:  3.00 cm MITRAL VALVE               TRICUSPID VALVE MV Area (PHT): 3.63 cm    TR Peak grad:   36.2 mmHg MV Decel Time: 209 msec    TR Vmax:        301.00 cm/s MR Peak grad: 31.8 mmHg MR Vmax:      282.00 cm/s  SHUNTS MV E velocity: 53.60 cm/s  Systemic VTI:  0.10 m MV A velocity: 60.40 cm/s  Systemic Diam: 2.00 cm MV E/A ratio:  0.89 Kirk Ruths MD Electronically signed by Kirk Ruths MD Signature Date/Time: 10/25/2021/4:43:55 PM    Final    CT ABDOMEN PELVIS WO CONTRAST  Result Date: 10/25/2021 CLINICAL DATA:  Abdominal pain, acute, nonlocalized. Nausea and vomiting. EXAM: CT ABDOMEN AND PELVIS WITHOUT CONTRAST TECHNIQUE: Multidetector CT imaging of the abdomen and pelvis was performed following the standard protocol without IV contrast. RADIATION DOSE REDUCTION: This exam was performed according to the departmental dose-optimization program which includes automated exposure control, adjustment of the mA and/or kV according to patient size and/or use of iterative reconstruction technique. COMPARISON:  CT abdomen dated 10/01/2012. Chest CT angiogram dated 10/23/2021. FINDINGS: Lower chest: Small bilateral pleural effusions. The LEFT-sided pleural effusion is significantly decreased compared to the chest CT angiogram of 10/23/2021, status post drainage. There is associated bibasilar atelectasis. Additional dense mass-like consolidations are seen within the LEFT lower lung, lateral component measuring 6.6 x 4.1 cm (series 2, image 8) and posterior-medial component measuring 7 x 3 cm (series 2, image 20). The posterior-medial masslike consolidation was also described on the chest CT angiogram report of  10/23/2021 and are highly suspicious for neoplastic masses.  Hepatobiliary: No focal liver abnormality is seen. Small amount of layering sludge and/or stones within the gallbladder versus vicarious excretion of earlier contrast administration. Gallbladder is otherwise unremarkable. No pericholecystic inflammation. No bile duct dilatation is seen. Pancreas: Unremarkable. No pancreatic ductal dilatation or surrounding inflammatory changes. Spleen: Normal in size without focal abnormality. Adrenals/Urinary Tract: 1.3 cm mass associated with the LEFT adrenal gland with Hounsfield unit measurement of 25, stable compared to the earlier CT abdomen of 10/01/2012. No follow-up imaging is recommended for this benign finding. RIGHT adrenal gland is unremarkable. Kidneys are unremarkable without mass, stone or hydronephrosis. No ureteral calculi are identified. Bladder is unremarkable, partially decompressed. Stomach/Bowel: Fairly prominent stool ball in the rectal vault. Rectosigmoid colon is otherwise unremarkable. Diverticulosis is present within the upper sigmoid colon but no focal inflammatory change to suggest acute diverticulitis. Questionable thickening of the walls of the descending colon, with subtle pericolonic inflammation/fluid stranding, suspicious for a localized colitis of infectious or inflammatory nature. Remainder of the colon is unremarkable. Appendix is not seen but there are no inflammatory changes about the cecum to suggest acute appendicitis. Stomach is unremarkable, partially decompressed. Vascular/Lymphatic: Extensive aortic atherosclerosis. Infrarenal abdominal aorta is grossly stable in caliber. 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 Bypass graft again noted from the RIGHT common iliac artery to the RIGHT kidney. No acute-appearing vascular abnormality is identified, although characterization is limited by the lack of IV contrast.  No enlarged lymph nodes are seen within the abdomen or pelvis. Reproductive: Uterus and bilateral adnexa are unremarkable. Other: No free fluid, hemorrhage or abscess collection is seen within the abdomen or pelvis. No free intraperitoneal air. Musculoskeletal: Chronic compression fracture deformity of the T11 vertebral body, status post earlier vertebroplasty, stable appearance compared to the earlier CT abdomen of 10/01/2012. No acute-appearing osseous abnormality. IMPRESSION: 1. Questionable thickening of the walls of the descending colon, with subtle pericolonic inflammation/fluid stranding, suspicious for a localized mild colitis of infectious or inflammatory nature. No bowel obstruction or evidence of small bowel inflammation. 2. Small bilateral pleural effusions. The LEFT-sided pleural effusion is significantly decreased compared to the chest CT angiogram of 10/23/2021, status post drainage. There is associated bibasilar atelectasis. 3. Additional dense mass-like consolidations are seen within the LEFT lower lung, both possibly pleural based, lateral component measuring 6.6 x 4.1 cm and posterior-medial component measuring 7 x 3 cm. The posterior-medial masslike consolidation was also described on the chest CT angiogram report of 10/23/2021 and are highly suspicious for neoplastic masses. 4. Fairly prominent stool ball in the rectal vault. 5. Sigmoid colon diverticulosis without evidence of acute diverticulitis. 6. Small amount of layering sludge and/or stones within the gallbladder versus vicarious excretion of earlier contrast administration. No evidence of acute cholecystitis. Aortic Atherosclerosis (ICD10-I70.0). Electronically Signed   By: Franki Cabot M.D.   On: 10/25/2021 12:55   [place] EKG SITE RITE  Result Date: 10/25/2021 If Site Rite image not attached, placement could not be confirmed due to current cardiac rhythm.  DG Chest Port 1 View  Result Date: 10/25/2021 CLINICAL DATA:  Pleural effusion  on left. EXAM: PORTABLE CHEST 1 VIEW COMPARISON:  Chest x-ray dated October 24, 2021 FINDINGS: Visualized cardiac and mediastinal contours are unchanged, including left hilar fullness. Small left pleural effusion is slightly decreased in size when compared with prior exam. Left-sided chest tube in place. Bibasilar atelectasis. No evidence of pneumothorax. IMPRESSION: Small left pleural effusion is slightly decreased in size when  compared to prior exam. Left-sided chest tube in place. Electronically Signed   By: Yetta Glassman M.D.   On: 10/25/2021 08:51    Cardiac Studies   ECHO: 2018 - Left ventricle: The cavity size was normal. Systolic function was    vigorous. The estimated ejection fraction was in the range of 65%    to 70%. Wall motion was normal; there were no regional wall    motion abnormalities. Doppler parameters are consistent with    abnormal left ventricular relaxation (grade 1 diastolic    dysfunction). Doppler parameters are consistent with    indeterminate ventricular filling pressure.  - Aortic valve: Valve mobility was restricted. There was moderate    stenosis. There was no regurgitation. Peak velocity (S): 381    cm/s. Mean gradient (S): 35 mm Hg.  - Mitral valve: Calcified annulus. Transvalvular velocity was    within the normal range. There was no evidence for stenosis.    There was trivial regurgitation.  - Right ventricle: The cavity size was normal. Wall thickness was    normal. Systolic function was normal.  - Atrial septum: No defect or patent foramen ovale was identified    by color flow Doppler.  - Tricuspid valve: There was mild regurgitation.  - Pulmonary arteries: Systolic pressure was mildly increased. PA    peak pressure: 41 mm Hg (S).    R/L HEART CATH: 06/2011 Hemodynamics:                                     RA 2                                     RV 32/3                                     PA 34/11  mean 22                                      PCWP Mean 11                                     LV 161/18                                     AO 146/96                                     AV area  1.74                                     AV gradient (mean)  6.72             Oxygen saturations:  PA 64                                     AO 95               Cardiac Output (Fick) 4.2                               Cardiac Index (Fick) 2.2              Coronary angiography:   Coronary dominance: Right   Left mainstem: Normal   Left anterior descending (LAD): Mild luminal irregularities.  Small diagonals normal.   Left circumflex (LCx): RI large and normal.  AV groove mild luminal irregularities   Right coronary artery (RCA): Normal.  PDA large normal.  PL small to moderate and normal.   Left ventriculography: Left ventricular systolic function is normal, LVEF is estimated at 55-65%, there is no significant mitral regurgitation    Final Conclusions:  Normal coronaries.  NL LF function.  Only mild AS by this measurement.  Echo suggests severe.  Pulmonary pressures and EDP are not elevated.   Recommendations:  Based on this the aortic valve does not appear to be the etiology for her dypsnea.  No further cardiac work up.       Minus Breeding 06/27/2011, 11:13 AM  Patient Profile     87 y.o. female with atrial fibrillation paroxysmal, left pleural effusion exudative recent mechanical fall secondary to weakness dehydration, left lower lobe mass  Assessment & Plan    Atrial fibrillation - Since atrial fibrillation returned yesterday, would normally encourage use of anticoagulation however she did just receive lytics via chest tube.  Risk of bleeding outweighs benefits at this point.  Frail. - converted back to NSR yesterday and IV Amio stopped due to hypotension -- need to prevent further episodes of afib with RVR as may cause hemodynamic instability in the setting of severe AS --will start Amio  '200mg'$  PO BID for afib suppression (PO form should not cause hypotension)  Aortic stenosis - low gradient low flow severe AS with Mean gradient 22 mmHg but DVI low at 0.24 and AVA 0.7cm2, not currently a candidate for TAVR -need to try to avoid hypotension  Pleural effusion and new left lower lobe lung mass - Per primary team  Hypotension -unclear etiology>>?afib with RVR and  IV Amio in setting of severe AS vs. Large fluid shifts with Chest tube output.  BP much improved on Levo gtt and hydrocortisone>>cortisol level pending (has known pituitary adenmoa with multiple resections) --2D echo with normal LVF  -Amio IV gtt off -have been able to wean down to low dose Levo gtt  Performed by: Fransico Him, MD   Total critical care time: 35 minutes   Critical care time was exclusive of separately billable procedures and treating other patients.   Critical care was necessary to treat or prevent imminent or life-threatening deterioration.   Critical care was time spent personally by me (independent of APPs or residents) on the following activities: development of treatment plan with patient and/or surrogate as well as nursing, discussions with consultants, evaluation of patient's response to treatment, examination of patient, obtaining history from patient or surrogate, ordering and performing treatments and interventions, ordering and review of laboratory studies, ordering and review of radiographic studies, pulse oximetry and re-evaluation of patient's condition.  For questions or updates, please contact [place] Please consult [email or web address] for contact info under        Signed, Fransico Him, MD  10/26/2021, 6:46 AM

## 2021-10-26 NOTE — Progress Notes (Signed)
Patient refuses CPAP for tonight.  

## 2021-10-26 NOTE — Progress Notes (Signed)
[place] Progress Note Patient Name: [name] DOB: 1934/03/24 MRN: [id number]   Date of Service  10/26/2021  HPI/Events of Note  Oliguria - Urine output 100 mL all shift. Cardiac echo reveals LVEF > 75% and AS.  eICU Interventions  Plan: Bolus with LR 500 mL IV over 1 hour now.      Intervention Category Major Interventions: Other:  [name] [name] 10/26/2021, 5:08 AM

## 2021-10-26 NOTE — Progress Notes (Signed)
Patient refuses to wear CPAP, RN & RT educated her and patient said she used to wear CPAP at home but it made her feel claustrophobic and anxious.

## 2021-10-26 NOTE — Progress Notes (Addendum)
NAME:  FLOREINE KINGDON, MRN:  [id number], DOB:  October 26, 1934, LOS: 3 ADMISSION DATE:  10/23/2021 CONSULTATION DATE:  10/23/2021 REFERRING MD:  [place] - EDP CHIEF COMPLAINT:  Hypotension in the setting of Afib with RVR   BRIEF  87 year old woman who presented to [facility] ED 9/6 post-mechanical fall, also with decreased PO intake/weakness x 5 days. Hit posterior head while getting out of the bathroom; no LOC. Not on AC. PMHx significant for HTN, HLD, COPD/OSA, T2DM, fibromuscular dysplasia, pseudogout.  Per chart review, patient had been feeling very weak and sustained a mechanical fall when ambulating back from the restroom. She struck her posterior head on the floor but did not have LOC. Denies recent fever/chills, CP/SOB, n/v/d or abdominal pain. Endorses HA s/p fall. EMS noted mild hypotension in the field, initially corrected after fluid administration.  On ED evaluation, afebrile with HR 100s, mildly tachypneic to 25, BP 129/62, SpO2 91% on RA. Labs notable for WBC/H&H WNL, Plt 572, lytes WNL, Cr 1.03 (baseline 0.8), normal AST/ALT, Tbili 1.3. Serum LDH 213, trop WNL. LA 1.6. COVID/Flu negative. CT Head/Cspine NAICA, no fracture. CXR with large L pleural effusion. CTA Chest negative for PE, large left pleural effusion noted with R mediastinal shift, occluded LLL bronchus, concern for poorly defined LLL mass.   PCCM consulted for chest tube placement/new LLL mass. Patient became hypotensive requiring Neosynephrine initiation and was admitted to ICU.  Pertinent Medical History:   has a past medical history of Allergic rhinitis, Arthritis, Complication of anesthesia, COPD (chronic obstructive pulmonary disease) ([place]), Diabetes mellitus, Fibromuscular dysplasia ([place]), Hyperlipidemia, Hypertension, PONV (postoperative nausea and vomiting), Pseudogout, and Sleep apnea.   has a past surgical history that includes pituitary tumor removed; Back surgery (2005); Tonsillectomy; nasal septal deviation; bilateral  renal bypass; and Esophagogastroduodenoscopy (N/A, 10/01/2012).   Significant Hospital Events: Including procedures, antibiotic start and stop dates in addition to other pertinent events   9/6 - Presented to [facility] ED for mechanical fall with weakness, poor PO intake. CT Chest negative for PE, c/f new LLL mass and large L pleural effusion with R mediastinal shift. CT Head/Cspine negative. Chest tube placed. 9/7 intrapleural lytics administered dose 1 -> 750cc  out. -> CT abd lung cut 9/8 with very small residual effusion only  Interim History / Subjective:    10/25/21: approx 222h ago -> suddent shock with vomitting and abd pain. CT with Descending COLITIS. -> Flagyl started. On levophed and hydrocor. ECHO with severe AS and EF > 70% and hyperdymanic -> gioven fluids -> this AM  feels better. Looks better. Hungry and wants to eat. cPAP at her side - says she used 15 years ago for 1-2 years and tried last night but did not tolerate. Per RN - not making urine. -> CREAT UP 1.'04mg'$ % AMio gtt off x 22h. HR 88 - > monitor says sinus. On levophed 35mg -> MAP 70 with low diast. NSR  Objective:  Blood pressure (!) 106/41, pulse 90, temperature 97.6 F (36.4 C), temperature source Axillary, resp. rate 18, weight 60 kg, SpO2 95 %.        Intake/Output Summary (Last 24 hours) at 10/26/2021 0812 Last data filed at 10/26/2021 0700 Gross per 24 hour  Intake 1987.49 ml  Output 350 ml  Net 1637.49 ml   Filed Weights   10/24/21 0500 10/25/21 0400 10/26/21 0400  Weight: 59.7 kg 61.2 kg 60 kg    Physical Examination: General Appearance:  Looks better. Frail., cachectic, Lying in bed. No distress .  2L [place] + Head:  Normocephalic, without obvious abnormality, atraumatic Eyes:  PERRL - yes, conjunctiva/corneas - muddy     Ears:  Normal external ear canals, both ears Nose:  G tube - no but has Oliver Throat:  ETT TUBE - no , OG tube - no Neck:  Supple,  No enlargement/tenderness/nodules Lungs: Clear to auscultation  bilaterally, V Heart:  S1 and S2 normal, no murmur, CVP - no.  Pressors - LEVOPHED 13mg Abdomen:  Soft, no masses, no organomegaly Genitalia / Rectal:  Not done Extremities:  Extremities- intact Skin:  ntact in exposed areas . Sacral area - not examined Neurologic:  Sedation - none -> RASS - +1 . Moves all 4s - yes. CAM-ICU - not tested . Orientation - follows command. Memory short term?      Assessment & Plan:   Large left pleural effusion s/p chest tube placement 9/6 with 2L output and s/p fibrinolytic 10/24/21 with anotuer 750cc. Cytoology 9/6 - non diagnostic. LDH 500  10/26/21: CT yeserday with minimal residual Left effusion  Plan  - hold off 2nd dose intrapleural fibrinolytiucs - follow colinically - chest tube to remain for now with suciton   New LLL mass 6cm pleural based  - CTA Chest 9/6 with large L pleural effusion noted with R mediastinal shift, occluded LLL bronchus, concern for poorly defined LLL mass.  Negative for PE. -> 6cm pleural based mass on CT 10/25/21  Plan  - IR consult for CT guided buiopsy consult placd 10/26/21 - Per Dr. EDenna Haggard  He feels this is not straightforward due to potential collapsed lung obscuring the picture.  Also questioning whether the pleural-based masses could be hematoma vs. blood products from her fall. He recommends short-term follow-up CT in 6-8 weeks to see if a more obvious target presents itself.  This would also give her time to recover from her acute illness.  - will add cefepime  - check urine strep - chck urine leg  Afib with RVR  10/26/21: on 10/25/21 with hypotension with colitis -> Amio gtt stopped. Currently HR 88 and EKG NSR Cards recommending cntinued amio . Currently NSR  Plan  - if able to toelerat diet 10/26/21:  Transition Amiodarone to PO.  - cards recommending anticoagualtion when stable  Aortic stenons - Present on Admit. Moderate in 2018. Severe this admission echo  9/9: new finding AS is now severe  Plan  - per  cards  - avoid over diuresis  Circulatory shock - onset 10/25/21 with abdominal pain colitis +/- dehydration (given high EF)   9/9 - low diast . Down on leveophed to 224m. Random cortisol pre  hydrocrot 31 yesterday  Plan - dc hydrocort  - change MAP goal > 60 and sbp> 105 -repeat 500cc fluid bolus   AKI (baseline 0.7gm%) - Present on Admit - with creat 1.'03mg'$ %   10/26/21: improved as of 10/25/21 but worse again creat to 1.'04mg'$ % with decreased Ur OP  Plan  - bladder scan  - repeat fluid bolus   [place] COlitis - onset 10/25/21 with abd pain and vomitting  10/26/21: No abd pain. Wants to eat. Abd soft  Plan - regular diet - monitor - Flagyl  Mechanical fall secondary to weakness and severe fraility- CT Head/Cspine negative for AICA/fracture. Mild-Mod PCM baseline - Alb 3.2   9/9: ongoing frailty  Plan - monitor - PT recommending short term SNF.    Best practice:  Diet: regular 10/26/21 Pain/Anxiety/Delirium protocol (if indicated): x VAP protocol (  if indicated): x DVT prophylaxis: SQ lovenox. Will need fulll AC asap due to a fib GI prophylaxis: x Glucose control: ssi Mobility: bed rest Code Status: DNR but full medical care Family Communication: Gerarda Fraction [address] [place] Disposition: ICU     ATTESTATION & SIGNATURE   The patient EDISON WOLLSCHLAGER is critically ill with multiple organ systems failure and requires high complexity decision making for assessment and support, frequent evaluation and titration of therapies, application of advanced monitoring technologies and extensive interpretation of multiple databases.   Critical Care Time devoted to patient care services described in this note is  45  Minutes. This time reflects time of care of this signee Dr Brand Males. This critical care time does not reflect procedure time, or teaching time or supervisory time of PA/NP/Med student/Med Resident etc but could involve care discussion time      Dr. Brand Males, M.D., [facility].C.P Pulmonary and Critical Care Medicine Medical Director - [facility] ICU Staff Physician, [facility] Pager: [phone], If no answer or between  15:00h - 7:00h: call [phone]  10/26/2021 8:12 AM   LABS    PULMONARY Recent Labs  Lab 10/25/21 1033  PHART 7.5*  PCO2ART 27*  PO2ART 93  HCO3 21.1  O2SAT 99.5    CBC Recent Labs  Lab 10/23/21 1545 10/24/21 0008 10/25/21 1048  HGB 13.6 12.4 13.5  HCT 43.4 40.6 42.8  WBC 10.3 9.6 12.8*  PLT 572* 479* 626*    COAGULATION Recent Labs  Lab 10/25/21 1048  INR 1.5*    CARDIAC  No results for input(s): "TROPONINI" in the last 168 hours. No results for input(s): "PROBNP" in the last 168 hours.   CHEMISTRY Recent Labs  Lab 10/23/21 1545 10/24/21 0008 10/25/21 1048 10/26/21 0422  NA 140 138 132* 132*  K 3.7 3.9 3.3* 3.8  CL 103 104 100 99  CO2 22 20* 21* 24  GLUCOSE 90 105* 210* 167*  BUN 21 19 24* 29*  CREATININE 1.03* 0.89 0.99 1.04*  CALCIUM 9.5 8.8* 8.7* 8.4*  MG  --  1.6*  --  2.0  PHOS  --  3.5  --   --    CrCl cannot be calculated (Unknown ideal weight.).   LIVER Recent Labs  Lab 10/23/21 1545 10/25/21 1048 10/26/21 0422  AST 16 12* 12*  ALT '9 8 7  '$ ALKPHOS 115 92 112  BILITOT 1.3* 0.6 0.7  PROT 7.0  7.1 5.3* 5.3*  ALBUMIN 3.2* 2.2* 2.3*  INR  --  1.5*  --      INFECTIOUS Recent Labs  Lab 10/25/21 1048 10/25/21 1350 10/25/21 1725 10/26/21 0422  LATICACIDVEN 2.8* 1.7 1.6  --   PROCALCITON 0.77  --   --  1.14     ENDOCRINE CBG (last 3)  Recent Labs    10/25/21 1903 10/26/21 0718  GLUCAP 205* 173*         IMAGING x48h  - image(s) personally visualized  -   highlighted in bold DG CHEST PORT 1 VIEW  Result Date: 10/25/2021 CLINICAL DATA:  Pleural effusion history of COPD EXAM: PORTABLE CHEST 1 VIEW COMPARISON:  10/25/2021, 10/24/2021, 10/23/2021, CT chest 10/23/2021  FINDINGS: Left-sided chest tube remains in place. Residual small moderate left effusion and small right effusion. Persistent consolidation and airspace disease at left base. Left hilar enlargement no discrete left pneumothorax. Right upper extremity central venous catheter tip  at the cavoatrial junction. IMPRESSION: 1. Similar small moderate left effusion and airspace disease at left lung base. 2. Trace right pleural effusion Electronically Signed   By: Donavan Foil M.D.   On: 10/25/2021 18:08   ECHOCARDIOGRAM COMPLETE  Result Date: 10/25/2021    ECHOCARDIOGRAM REPORT   Patient Name:   AMRUTHA AVERA Date of Exam: 10/25/2021 Medical Rec #:  [id number]            Height:       63.0 in Accession #:    [id number]           Weight:       134.9 lb Date of Birth:  13-Aug-1934            BSA:          1.636 m Patient Age:    49 years             BP:           122/53 mmHg Patient Gender: F                    HR:           86 bpm. Exam Location:  Inpatient Procedure: 2D Echo and Intracardiac Opacification Agent Indications:    AFIB  History:        Patient has prior history of Echocardiogram examinations, most                 recent 06/03/2016. Arrythmias:Atrial Fibrillation; Risk                 Factors:Hypertension and Diabetes.  Sonographer:    Harvie Junior Referring Phys: [phone] Alfonso Carden  Sonographer Comments: Technically difficult study due to poor echo windows, suboptimal apical window and suboptimal parasternal window. Image acquisition challenging due to COPD, Image acquisition challenging due to respiratory motion and patient supine. IMPRESSIONS  1. Calcified aortic valve with concern for low flow, low gradient severe AS (mean gradient 22 mmHg, AVA 0.7 cm2 and DI 0.24).  2. Left ventricular ejection fraction, by estimation, is >75%. The left ventricle has hyperdynamic function. The left ventricle has no regional wall motion abnormalities. There is moderate concentric left ventricular hypertrophy. Left  ventricular diastolic parameters are consistent with Grade I diastolic dysfunction (impaired relaxation).  3. Right ventricular systolic function is normal. The right ventricular size is normal. There is mildly elevated pulmonary artery systolic pressure.  4. The mitral valve is normal in structure. Trivial mitral valve regurgitation. No evidence of mitral stenosis.  5. The aortic valve is calcified. Aortic valve regurgitation is trivial. Severe aortic valve stenosis.  6. The inferior vena cava is normal in size with greater than 50% respiratory variability, suggesting right atrial pressure of 3 mmHg. Comparison(s): No prior Echocardiogram. FINDINGS  Left Ventricle: Left ventricular ejection fraction, by estimation, is >75%. The left ventricle has hyperdynamic function. The left ventricle has no regional wall motion abnormalities. Definity contrast agent was given IV to delineate the left ventricular endocardial borders. The left ventricular internal cavity size was normal in size. There is moderate concentric left ventricular hypertrophy. Left ventricular diastolic parameters are consistent with Grade I diastolic dysfunction (impaired relaxation). Right Ventricle: The right ventricular size is normal. Right ventricular systolic function is normal. There is mildly elevated pulmonary artery systolic pressure. The tricuspid regurgitant velocity is 3.01 m/s, and with an assumed right atrial pressure of 8 mmHg, the estimated right ventricular systolic pressure is 60.7 mmHg. Left  Atrium: Left atrial size was normal in size. Right Atrium: Right atrial size was normal in size. Pericardium: There is no evidence of pericardial effusion. Mitral Valve: The mitral valve is normal in structure. Mild mitral annular calcification. Trivial mitral valve regurgitation. No evidence of mitral valve stenosis. Tricuspid Valve: The tricuspid valve is normal in structure. Tricuspid valve regurgitation is mild . No evidence of tricuspid  stenosis. Aortic Valve: The aortic valve is calcified. Aortic valve regurgitation is trivial. Aortic regurgitation PHT measures 513 msec. Severe aortic stenosis is present. Aortic valve mean gradient measures 21.5 mmHg. Aortic valve peak gradient measures 34.0 mmHg. Aortic valve area, by VTI measures 0.74 cm. Pulmonic Valve: The pulmonic valve was normal in structure. Pulmonic valve regurgitation is not visualized. No evidence of pulmonic stenosis. Aorta: The aortic root is normal in size and structure. Venous: The inferior vena cava is normal in size with greater than 50% respiratory variability, suggesting right atrial pressure of 3 mmHg. IAS/Shunts: No atrial level shunt detected by color flow Doppler. Additional Comments: Calcified aortic valve with concern for low flow, low gradient severe AS (mean gradient 22 mmHg, AVA 0.7 cm2 and DI 0.24).  LEFT VENTRICLE PLAX 2D LVIDd:         2.25 cm     Diastology LVIDs:         1.50 cm     LV e' medial:    4.03 cm/s LV PW:         1.20 cm     LV E/e' medial:  13.3 LV IVS:        1.20 cm     LV e' lateral:   3.59 cm/s LVOT diam:     2.00 cm     LV E/e' lateral: 14.9 LV SV:         32 LV SV Index:   19 LVOT Area:     3.14 cm  LV Volumes (MOD) LV vol d, MOD A2C: 26.3 ml LV vol d, MOD A4C: 35.7 ml LV vol s, MOD A2C: 4.7 ml LV vol s, MOD A4C: 8.9 ml LV SV MOD A2C:     21.6 ml LV SV MOD A4C:     35.7 ml LV SV MOD BP:      24.2 ml RIGHT VENTRICLE RV S prime:     6.64 cm/s TAPSE (M-mode): 1.0 cm LEFT ATRIUM             Index LA diam:        2.60 cm 1.59 cm/m LA Vol (A2C):   17.7 ml 10.82 ml/m LA Vol (A4C):   18.5 ml 11.31 ml/m LA Biplane Vol: 18.0 ml 11.00 ml/m  AORTIC VALVE                     PULMONIC VALVE AV Area (Vmax):    0.67 cm      PV Vmax:       2.31 m/s AV Area (Vmean):   0.66 cm      PV Peak grad:  21.3 mmHg AV Area (VTI):     0.74 cm AV Vmax:           291.75 cm/s AV Vmean:          189.333 cm/s AV VTI:            0.428 m AV Peak Grad:      34.0 mmHg AV Mean  Grad:      21.5 mmHg LVOT Vmax:  62.10 cm/s LVOT Vmean:        39.800 cm/s LVOT VTI:          0.101 m LVOT/AV VTI ratio: 0.24 AI PHT:            513 msec  AORTA Ao Root diam: 3.00 cm Ao Asc diam:  3.00 cm MITRAL VALVE               TRICUSPID VALVE MV Area (PHT): 3.63 cm    TR Peak grad:   36.2 mmHg MV Decel Time: 209 msec    TR Vmax:        301.00 cm/s MR Peak grad: 31.8 mmHg MR Vmax:      282.00 cm/s  SHUNTS MV E velocity: 53.60 cm/s  Systemic VTI:  0.10 m MV A velocity: 60.40 cm/s  Systemic Diam: 2.00 cm MV E/A ratio:  0.89 Kirk Ruths MD Electronically signed by Kirk Ruths MD Signature Date/Time: 10/25/2021/4:43:55 PM    Final    CT ABDOMEN PELVIS WO CONTRAST  Result Date: 10/25/2021 CLINICAL DATA:  Abdominal pain, acute, nonlocalized. Nausea and vomiting. EXAM: CT ABDOMEN AND PELVIS WITHOUT CONTRAST TECHNIQUE: Multidetector CT imaging of the abdomen and pelvis was performed following the standard protocol without IV contrast. RADIATION DOSE REDUCTION: This exam was performed according to the departmental dose-optimization program which includes automated exposure control, adjustment of the mA and/or kV according to patient size and/or use of iterative reconstruction technique. COMPARISON:  CT abdomen dated 10/01/2012. Chest CT angiogram dated 10/23/2021. FINDINGS: Lower chest: Small bilateral pleural effusions. The LEFT-sided pleural effusion is significantly decreased compared to the chest CT angiogram of 10/23/2021, status post drainage. There is associated bibasilar atelectasis. Additional dense mass-like consolidations are seen within the LEFT lower lung, lateral component measuring 6.6 x 4.1 cm (series 2, image 8) and posterior-medial component measuring 7 x 3 cm (series 2, image 20). The posterior-medial masslike consolidation was also described on the chest CT angiogram report of 10/23/2021 and are highly suspicious for neoplastic masses. Hepatobiliary: No focal liver abnormality is seen.  Small amount of layering sludge and/or stones within the gallbladder versus vicarious excretion of earlier contrast administration. Gallbladder is otherwise unremarkable. No pericholecystic inflammation. No bile duct dilatation is seen. Pancreas: Unremarkable. No pancreatic ductal dilatation or surrounding inflammatory changes. Spleen: Normal in size without focal abnormality. Adrenals/Urinary Tract: 1.3 cm mass associated with the LEFT adrenal gland with Hounsfield unit measurement of 25, stable compared to the earlier CT abdomen of 10/01/2012. No follow-up imaging is recommended for this benign finding. RIGHT adrenal gland is unremarkable. Kidneys are unremarkable without mass, stone or hydronephrosis. No ureteral calculi are identified. Bladder is unremarkable, partially decompressed. Stomach/Bowel: Fairly prominent stool ball in the rectal vault. Rectosigmoid colon is otherwise unremarkable. Diverticulosis is present within the upper sigmoid colon but no focal inflammatory change to suggest acute diverticulitis. Questionable thickening of the walls of the descending colon, with subtle pericolonic inflammation/fluid stranding, suspicious for a localized colitis of infectious or inflammatory nature. Remainder of the colon is unremarkable. Appendix is not seen but there are no inflammatory changes about the cecum to suggest acute appendicitis. Stomach is unremarkable, partially decompressed. Vascular/Lymphatic: Extensive aortic atherosclerosis. Infrarenal abdominal aorta is grossly stable in caliber. 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 Bypass graft again noted from the RIGHT common iliac artery to the RIGHT kidney. No acute-appearing vascular abnormality is identified, although characterization is limited by the lack of  IV contrast. No enlarged lymph nodes are seen within the abdomen or pelvis. Reproductive: Uterus and bilateral adnexa are  unremarkable. Other: No free fluid, hemorrhage or abscess collection is seen within the abdomen or pelvis. No free intraperitoneal air. Musculoskeletal: Chronic compression fracture deformity of the T11 vertebral body, status post earlier vertebroplasty, stable appearance compared to the earlier CT abdomen of 10/01/2012. No acute-appearing osseous abnormality. IMPRESSION: 1. Questionable thickening of the walls of the descending colon, with subtle pericolonic inflammation/fluid stranding, suspicious for a localized mild colitis of infectious or inflammatory nature. No bowel obstruction or evidence of small bowel inflammation. 2. Small bilateral pleural effusions. The LEFT-sided pleural effusion is significantly decreased compared to the chest CT angiogram of 10/23/2021, status post drainage. There is associated bibasilar atelectasis. 3. Additional dense mass-like consolidations are seen within the LEFT lower lung, both possibly pleural based, lateral component measuring 6.6 x 4.1 cm and posterior-medial component measuring 7 x 3 cm. The posterior-medial masslike consolidation was also described on the chest CT angiogram report of 10/23/2021 and are highly suspicious for neoplastic masses. 4. Fairly prominent stool ball in the rectal vault. 5. Sigmoid colon diverticulosis without evidence of acute diverticulitis. 6. Small amount of layering sludge and/or stones within the gallbladder versus vicarious excretion of earlier contrast administration. No evidence of acute cholecystitis. Aortic Atherosclerosis (ICD10-I70.0). Electronically Signed   By: Franki Cabot M.D.   On: 10/25/2021 12:55   [place] EKG SITE RITE  Result Date: 10/25/2021 If Site Rite image not attached, placement could not be confirmed due to current cardiac rhythm.  DG Chest Port 1 View  Result Date: 10/25/2021 CLINICAL DATA:  Pleural effusion on left. EXAM: PORTABLE CHEST 1 VIEW COMPARISON:  Chest x-ray dated October 24, 2021 FINDINGS: Visualized  cardiac and mediastinal contours are unchanged, including left hilar fullness. Small left pleural effusion is slightly decreased in size when compared with prior exam. Left-sided chest tube in place. Bibasilar atelectasis. No evidence of pneumothorax. IMPRESSION: Small left pleural effusion is slightly decreased in size when compared to prior exam. Left-sided chest tube in place. Electronically Signed   By: Yetta Glassman M.D.   On: 10/25/2021 08:51

## 2021-10-26 NOTE — Progress Notes (Signed)
Pharmacy Antibiotic Note  [name] is a 87 y.o. female admitted on 10/23/2021 s/p mechanical fall.  CT showed new LLL mass and large L pleural effusion with R mediastinal shift, chest tube was placed and intrapleural lytics administered x 1 on 9/7. Pharmacy has been consulted for Cefepime dosing for possible PNA.  Plan: Cefepime 2g IV q12h Follow up renal function & cultures  Height: '5\' 3"'$  (160 cm) Weight: 60 kg (132 lb 4.4 oz) IBW/kg (Calculated) : 52.4  Temp (24hrs), Avg:97.4 F (36.3 C), Min:96.7 F (35.9 C), Max:97.7 F (36.5 C)  Recent Labs  Lab 10/23/21 1545 10/23/21 1546 10/24/21 0008 10/25/21 1048 10/25/21 1350 10/25/21 1725 10/26/21 0422  WBC 10.3  --  9.6 12.8*  --   --   --   CREATININE 1.03*  --  0.89 0.99  --   --  1.04*  LATICACIDVEN  --  1.6  --  2.8* 1.7 1.6  --     Estimated Creatinine Clearance: 32.7 mL/min (A) (by C-G formula based on SCr of 1.04 mg/dL (H)).    Allergies  Allergen Reactions   Valacyclovir Hcl Rash    Antimicrobials this admission: 9/8 Flagyl >> 9/9 Cefepime >>  Dose adjustments this admission:  Microbiology results: 9/6 Pleural fluid: ngtd  Thank you for allowing pharmacy to be a part of this patient's care.  Peggyann Juba, PharmD, BCPS Pharmacy: [phone] 10/26/2021 12:14 PM

## 2021-10-26 NOTE — Progress Notes (Cosign Needed Addendum)
Interventional Radiology Brief Note:  [name] is an 87 y/o female admitted after mechanical fall at home. There was no LOC, but she was found to be hypotensive with afib in RVR requiring pressors.  Work-up also showed large L pleural effusion and LLL mass. Chest tube was placed by Dr. Tamala Julian and used for lytics which has yielded bloody fluid. IR consulted for LLL mass biopsy at the request of Dr. Chase Caller.   Case reviewed by Dr. Denna Haggard.  Complex radiographic findings in the setting of trauma with potential underlying pathology including collapsed lung obscuring picture. The pleural-based masses could be hematoma vs. blood products from her fall. A short-term follow-up CT (6-8 weeks?) would allow time to recover from acute illness/injuries and may yield a more apparent target for biopsy.  Discussed with Dr. Chase Caller.  No biopsy planned at this time.   Brynda Greathouse, MS RD PA-C

## 2021-10-27 ENCOUNTER — Telehealth: Payer: Self-pay | Admitting: Internal Medicine

## 2021-10-27 ENCOUNTER — Inpatient Hospital Stay (HOSPITAL_COMMUNITY): Payer: Medicare PPO

## 2021-10-27 DIAGNOSIS — J9601 Acute respiratory failure with hypoxia: Secondary | ICD-10-CM

## 2021-10-27 DIAGNOSIS — I10 Essential (primary) hypertension: Secondary | ICD-10-CM | POA: Diagnosis not present

## 2021-10-27 DIAGNOSIS — J90 Pleural effusion, not elsewhere classified: Secondary | ICD-10-CM | POA: Diagnosis not present

## 2021-10-27 DIAGNOSIS — I951 Orthostatic hypotension: Secondary | ICD-10-CM | POA: Diagnosis not present

## 2021-10-27 LAB — CBC
HCT: 35.5 % — ABNORMAL LOW (ref 36.0–46.0)
HCT: 34.9 % — ABNORMAL LOW (ref 36.0–46.0)
Hemoglobin: 11.3 g/dL — ABNORMAL LOW (ref 12.0–15.0)
Hemoglobin: 11.1 g/dL — ABNORMAL LOW (ref 12.0–15.0)
MCH: 28.2 pg (ref 26.0–34.0)
MCH: 28.5 pg (ref 26.0–34.0)
MCHC: 31.8 g/dL (ref 30.0–36.0)
MCHC: 31.8 g/dL (ref 30.0–36.0)
MCV: 88.5 fL (ref 80.0–100.0)
MCV: 89.5 fL (ref 80.0–100.0)
Platelets: 472 10*3/uL — ABNORMAL HIGH (ref 150–400)
Platelets: 485 10*3/uL — ABNORMAL HIGH (ref 150–400)
RBC: 4.01 MIL/uL (ref 3.87–5.11)
RBC: 3.9 MIL/uL (ref 3.87–5.11)
RDW: 15.8 % — ABNORMAL HIGH (ref 11.5–15.5)
RDW: 16.1 % — ABNORMAL HIGH (ref 11.5–15.5)
WBC: 13.3 10*3/uL — ABNORMAL HIGH (ref 4.0–10.5)
WBC: 13 10*3/uL — ABNORMAL HIGH (ref 4.0–10.5)
nRBC: 0 % (ref 0.0–0.2)
nRBC: 0 % (ref 0.0–0.2)

## 2021-10-27 LAB — BASIC METABOLIC PANEL
Anion gap: 7 (ref 5–15)
BUN: 34 mg/dL — ABNORMAL HIGH (ref 8–23)
CO2: 25 mmol/L (ref 22–32)
Calcium: 7.8 mg/dL — ABNORMAL LOW (ref 8.9–10.3)
Chloride: 101 mmol/L (ref 98–111)
Creatinine, Ser: 1.01 mg/dL — ABNORMAL HIGH (ref 0.44–1.00)
GFR, Estimated: 55 mL/min — ABNORMAL LOW (ref >60)
Glucose, Bld: 127 mg/dL — ABNORMAL HIGH (ref 70–99)
Potassium: 4 mmol/L (ref 3.5–5.1)
Sodium: 133 mmol/L — ABNORMAL LOW (ref 135–145)

## 2021-10-27 LAB — CORTISOL: Cortisol, Plasma: 26.1 ug/dL

## 2021-10-27 LAB — COMPREHENSIVE METABOLIC PANEL
ALT: 9 U/L (ref 0–44)
AST: 18 U/L (ref 15–41)
Albumin: 2 g/dL — ABNORMAL LOW (ref 3.5–5.0)
Alkaline Phosphatase: 101 U/L (ref 38–126)
Anion gap: 8 (ref 5–15)
BUN: 35 mg/dL — ABNORMAL HIGH (ref 8–23)
CO2: 24 mmol/L (ref 22–32)
Calcium: 7.9 mg/dL — ABNORMAL LOW (ref 8.9–10.3)
Chloride: 105 mmol/L (ref 98–111)
Creatinine, Ser: 1.17 mg/dL — ABNORMAL HIGH (ref 0.44–1.00)
GFR, Estimated: 46 mL/min — ABNORMAL LOW (ref >60)
Glucose, Bld: 144 mg/dL — ABNORMAL HIGH (ref 70–99)
Potassium: 3.8 mmol/L (ref 3.5–5.1)
Sodium: 137 mmol/L (ref 135–145)
Total Bilirubin: 0.4 mg/dL (ref 0.3–1.2)
Total Protein: 4.8 g/dL — ABNORMAL LOW (ref 6.5–8.1)

## 2021-10-27 LAB — PROCALCITONIN
Procalcitonin: 0.9 ng/mL
Procalcitonin: 0.83 ng/mL

## 2021-10-27 LAB — BODY FLUID CULTURE W GRAM STAIN
Culture: NO GROWTH
Gram Stain: NONE SEEN

## 2021-10-27 LAB — T3: T3, Total: 60 ng/dL — ABNORMAL LOW (ref 71–180)

## 2021-10-27 LAB — GLUCOSE, CAPILLARY
Glucose-Capillary: 120 mg/dL — ABNORMAL HIGH (ref 70–99)
Glucose-Capillary: 139 mg/dL — ABNORMAL HIGH (ref 70–99)

## 2021-10-27 LAB — LACTIC ACID, PLASMA: Lactic Acid, Venous: 2.4 mmol/L (ref 0.5–1.9)

## 2021-10-27 LAB — TROPONIN I (HIGH SENSITIVITY): Troponin I (High Sensitivity): 9 ng/L (ref <18)

## 2021-10-27 MED ORDER — LACTATED RINGERS IV BOLUS
500.0000 mL | Freq: Once | INTRAVENOUS | Status: AC
  Administered 2021-10-27: 500 mL via INTRAVENOUS

## 2021-10-27 NOTE — Progress Notes (Signed)
Pt refused to CPAP tonight.

## 2021-10-27 NOTE — Progress Notes (Signed)
OT Cancellation Note  Patient Details Name: [name] MRN: [id number] DOB: 1934-02-20   Cancelled Treatment:    Reason Eval/Treat Not Completed: Patient not medically ready. Patient hypotensive this morning. Hold per RN. Will f/u as able.   [name] 10/27/2021, 10:35 AM
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
## 2021-10-27 NOTE — Progress Notes (Signed)
Rounding Note    Patient Name: [name] Date of Encounter: 10/27/2021  [facility] Cardiologist: Candee Furbish, MD   Subjective   remains in normal sinus rhythm on p.o. Amio.    Now off pressors and blood pressure good at 126/56 mmHg   Scheduled Meds:  amiodarone  200 mg Oral Daily   atorvastatin  80 mg Oral QHS   Chlorhexidine Gluconate Cloth  6 each Topical Daily   enoxaparin (LOVENOX) injection  40 mg Subcutaneous Q24H   sodium chloride flush  10 mL Intrapleural Q8H   sodium chloride flush  10-40 mL Intracatheter Q12H   Continuous Infusions:  sodium chloride 10 mL/hr at 10/27/21 0600   ceFEPime (MAXIPIME) IV Stopped (10/27/21 0056)   metronidazole Stopped (10/27/21 0542)   norepinephrine (LEVOPHED) Adult infusion Stopped (10/26/21 1734)   PRN Meds: acetaminophen, calcium carbonate, docusate sodium, docusate sodium, iohexol, lip balm, mouth rinse, polyethylene glycol, sodium chloride flush   Vital Signs    Vitals:   10/27/21 0500 10/27/21 0600 10/27/21 0700 10/27/21 0800  BP: (!) 131/53 (!) 101/45 (!) 113/42 (!) 126/56  Pulse: 80 76 80 86  Resp: '13 12 12 17  '$ Temp:      TempSrc:      SpO2: 95% 96% 95% 93%  Weight:      Height:        Intake/Output Summary (Last 24 hours) at 10/27/2021 0816 Last data filed at 10/27/2021 0600 Gross per 24 hour  Intake 1241.41 ml  Output 660 ml  Net 581.41 ml       10/27/2021    4:43 AM 10/26/2021    4:00 AM 10/25/2021    4:00 AM  Last 3 Weights  Weight (lbs) 132 lb 4.4 oz 132 lb 4.4 oz 134 lb 14.7 oz  Weight (kg) 60 kg 60 kg 61.2 kg      Telemetry    NSR- Personally Reviewed  ECG    No new- Personally Reviewed  Physical Exam   GEN: Well nourished, well developed in no acute distress HEENT: Normal NECK: No JVD; No carotid bruits LYMPHATICS: No lymphadenopathy CARDIAC:RRR, no murmurs, rubs, gallops RESPIRATORY:  Clear to auscultation without rales, wheezing or rhonchi  ABDOMEN: Soft,  non-tender, non-distended MUSCULOSKELETAL:  No edema; No deformity  SKIN: Warm and dry NEUROLOGIC:  Alert and oriented x 3 PSYCHIATRIC:  Normal affect   Labs    High Sensitivity Troponin:   Recent Labs  Lab 10/23/21 1545 10/25/21 1048 10/25/21 1248 10/26/21 0422  TROPONINIHS 9 20* 19* 12      Chemistry Recent Labs  Lab 10/23/21 1545 10/24/21 0008 10/25/21 1048 10/26/21 0422 10/27/21 0433  NA 140 138 132* 132* 133*  K 3.7 3.9 3.3* 3.8 4.0  CL 103 104 100 99 101  CO2 22 20* 21* 24 25  GLUCOSE 90 105* 210* 167* 127*  BUN 21 19 24* 29* 34*  CREATININE 1.03* 0.89 0.99 1.04* 1.01*  CALCIUM 9.5 8.8* 8.7* 8.4* 7.8*  MG  --  1.6*  --  2.0  --   PROT 7.0  7.1  --  5.3* 5.3*  --   ALBUMIN 3.2*  --  2.2* 2.3*  --   AST 16  --  12* 12*  --   ALT 9  --  8 7  --   ALKPHOS 115  --  92 112  --   BILITOT 1.3*  --  0.6 0.7  --   GFRNONAA 53* >60 56* 53*  55*  ANIONGAP '15 14 11 9 7     '$ Lipids No results for input(s): "CHOL", "TRIG", "HDL", "LABVLDL", "LDLCALC", "CHOLHDL" in the last 168 hours.  Hematology Recent Labs  Lab 10/24/21 0008 10/25/21 1048 10/27/21 0433  WBC 9.6 12.8* 13.3*  RBC 4.40 4.80 4.01  HGB 12.4 13.5 11.3*  HCT 40.6 42.8 35.5*  MCV 92.3 89.2 88.5  MCH 28.2 28.1 28.2  MCHC 30.5 31.5 31.8  RDW 15.7* 15.6* 15.8*  PLT 479* 626* 472*    Thyroid  Recent Labs  Lab 10/24/21 1037 10/26/21 0422  TSH 0.034*  --   FREET4  --  1.49*     BNP Recent Labs  Lab 10/25/21 1048  BNP 510.4*     DDimer No results for input(s): "DDIMER" in the last 168 hours.   Radiology    DG CHEST PORT 1 VIEW  Result Date: 10/25/2021 CLINICAL DATA:  Pleural effusion history of COPD EXAM: PORTABLE CHEST 1 VIEW COMPARISON:  10/25/2021, 10/24/2021, 10/23/2021, CT chest 10/23/2021 FINDINGS: Left-sided chest tube remains in place. Residual small moderate left effusion and small right effusion. Persistent consolidation and airspace disease at left base. Left hilar enlargement no  discrete left pneumothorax. Right upper extremity central venous catheter tip at the cavoatrial junction. IMPRESSION: 1. Similar small moderate left effusion and airspace disease at left lung base. 2. Trace right pleural effusion Electronically Signed   By: Donavan Foil M.D.   On: 10/25/2021 18:08   ECHOCARDIOGRAM COMPLETE  Result Date: 10/25/2021    ECHOCARDIOGRAM REPORT   Patient Name:   [name] Date of Exam: 10/25/2021 Medical Rec #:  [id number]            Height:       63.0 in Accession #:    [id number]           Weight:       134.9 lb Date of Birth:  11-Oct-1934            BSA:          1.636 m Patient Age:    87 years             BP:           122/53 mmHg Patient Gender: F                    HR:           86 bpm. Exam Location:  Inpatient Procedure: 2D Echo and Intracardiac Opacification Agent Indications:    AFIB  History:        Patient has prior history of Echocardiogram examinations, most                 recent 06/03/2016. Arrythmias:Atrial Fibrillation; Risk                 Factors:Hypertension and Diabetes.  Sonographer:    Harvie Junior Referring Phys: [phone] MURALI RAMASWAMY  Sonographer Comments: Technically difficult study due to poor echo windows, suboptimal apical window and suboptimal parasternal window. Image acquisition challenging due to COPD, Image acquisition challenging due to respiratory motion and patient supine. IMPRESSIONS  1. Calcified aortic valve with concern for low flow, low gradient severe AS (mean gradient 22 mmHg, AVA 0.7 cm2 and DI 0.24).  2. Left ventricular ejection fraction, by estimation, is >75%. The left ventricle has hyperdynamic function. The left ventricle has no regional wall motion abnormalities. There is moderate concentric left ventricular hypertrophy. Left ventricular  diastolic parameters are consistent with Grade I diastolic dysfunction (impaired relaxation).  3. Right ventricular systolic function is normal. The right ventricular size is normal. There is  mildly elevated pulmonary artery systolic pressure.  4. The mitral valve is normal in structure. Trivial mitral valve regurgitation. No evidence of mitral stenosis.  5. The aortic valve is calcified. Aortic valve regurgitation is trivial. Severe aortic valve stenosis.  6. The inferior vena cava is normal in size with greater than 50% respiratory variability, suggesting right atrial pressure of 3 mmHg. Comparison(s): No prior Echocardiogram. FINDINGS  Left Ventricle: Left ventricular ejection fraction, by estimation, is >75%. The left ventricle has hyperdynamic function. The left ventricle has no regional wall motion abnormalities. Definity contrast agent was given IV to delineate the left ventricular endocardial borders. The left ventricular internal cavity size was normal in size. There is moderate concentric left ventricular hypertrophy. Left ventricular diastolic parameters are consistent with Grade I diastolic dysfunction (impaired relaxation). Right Ventricle: The right ventricular size is normal. Right ventricular systolic function is normal. There is mildly elevated pulmonary artery systolic pressure. The tricuspid regurgitant velocity is 3.01 m/s, and with an assumed right atrial pressure of 8 mmHg, the estimated right ventricular systolic pressure is 94.4 mmHg. Left Atrium: Left atrial size was normal in size. Right Atrium: Right atrial size was normal in size. Pericardium: There is no evidence of pericardial effusion. Mitral Valve: The mitral valve is normal in structure. Mild mitral annular calcification. Trivial mitral valve regurgitation. No evidence of mitral valve stenosis. Tricuspid Valve: The tricuspid valve is normal in structure. Tricuspid valve regurgitation is mild . No evidence of tricuspid stenosis. Aortic Valve: The aortic valve is calcified. Aortic valve regurgitation is trivial. Aortic regurgitation PHT measures 513 msec. Severe aortic stenosis is present. Aortic valve mean gradient  measures 21.5 mmHg. Aortic valve peak gradient measures 34.0 mmHg. Aortic valve area, by VTI measures 0.74 cm. Pulmonic Valve: The pulmonic valve was normal in structure. Pulmonic valve regurgitation is not visualized. No evidence of pulmonic stenosis. Aorta: The aortic root is normal in size and structure. Venous: The inferior vena cava is normal in size with greater than 50% respiratory variability, suggesting right atrial pressure of 3 mmHg. IAS/Shunts: No atrial level shunt detected by color flow Doppler. Additional Comments: Calcified aortic valve with concern for low flow, low gradient severe AS (mean gradient 22 mmHg, AVA 0.7 cm2 and DI 0.24).  LEFT VENTRICLE PLAX 2D LVIDd:         2.25 cm     Diastology LVIDs:         1.50 cm     LV e' medial:    4.03 cm/s LV PW:         1.20 cm     LV E/e' medial:  13.3 LV IVS:        1.20 cm     LV e' lateral:   3.59 cm/s LVOT diam:     2.00 cm     LV E/e' lateral: 14.9 LV SV:         32 LV SV Index:   19 LVOT Area:     3.14 cm  LV Volumes (MOD) LV vol d, MOD A2C: 26.3 ml LV vol d, MOD A4C: 35.7 ml LV vol s, MOD A2C: 4.7 ml LV vol s, MOD A4C: 8.9 ml LV SV MOD A2C:     21.6 ml LV SV MOD A4C:     35.7 ml LV SV MOD BP:  24.2 ml RIGHT VENTRICLE RV S prime:     6.64 cm/s TAPSE (M-mode): 1.0 cm LEFT ATRIUM             Index LA diam:        2.60 cm 1.59 cm/m LA Vol (A2C):   17.7 ml 10.82 ml/m LA Vol (A4C):   18.5 ml 11.31 ml/m LA Biplane Vol: 18.0 ml 11.00 ml/m  AORTIC VALVE                     PULMONIC VALVE AV Area (Vmax):    0.67 cm      PV Vmax:       2.31 m/s AV Area (Vmean):   0.66 cm      PV Peak grad:  21.3 mmHg AV Area (VTI):     0.74 cm AV Vmax:           291.75 cm/s AV Vmean:          189.333 cm/s AV VTI:            0.428 m AV Peak Grad:      34.0 mmHg AV Mean Grad:      21.5 mmHg LVOT Vmax:         62.10 cm/s LVOT Vmean:        39.800 cm/s LVOT VTI:          0.101 m LVOT/AV VTI ratio: 0.24 AI PHT:            513 msec  AORTA Ao Root diam: 3.00 cm Ao Asc  diam:  3.00 cm MITRAL VALVE               TRICUSPID VALVE MV Area (PHT): 3.63 cm    TR Peak grad:   36.2 mmHg MV Decel Time: 209 msec    TR Vmax:        301.00 cm/s MR Peak grad: 31.8 mmHg MR Vmax:      282.00 cm/s  SHUNTS MV E velocity: 53.60 cm/s  Systemic VTI:  0.10 m MV A velocity: 60.40 cm/s  Systemic Diam: 2.00 cm MV E/A ratio:  0.89 Kirk Ruths MD Electronically signed by Kirk Ruths MD Signature Date/Time: 10/25/2021/4:43:55 PM    Final    CT ABDOMEN PELVIS WO CONTRAST  Result Date: 10/25/2021 CLINICAL DATA:  Abdominal pain, acute, nonlocalized. Nausea and vomiting. EXAM: CT ABDOMEN AND PELVIS WITHOUT CONTRAST TECHNIQUE: Multidetector CT imaging of the abdomen and pelvis was performed following the standard protocol without IV contrast. RADIATION DOSE REDUCTION: This exam was performed according to the departmental dose-optimization program which includes automated exposure control, adjustment of the mA and/or kV according to patient size and/or use of iterative reconstruction technique. COMPARISON:  CT abdomen dated 10/01/2012. Chest CT angiogram dated 10/23/2021. FINDINGS: Lower chest: Small bilateral pleural effusions. The LEFT-sided pleural effusion is significantly decreased compared to the chest CT angiogram of 10/23/2021, status post drainage. There is associated bibasilar atelectasis. Additional dense mass-like consolidations are seen within the LEFT lower lung, lateral component measuring 6.6 x 4.1 cm (series 2, image 8) and posterior-medial component measuring 7 x 3 cm (series 2, image 20). The posterior-medial masslike consolidation was also described on the chest CT angiogram report of 10/23/2021 and are highly suspicious for neoplastic masses. Hepatobiliary: No focal liver abnormality is seen. Small amount of layering sludge and/or stones within the gallbladder versus vicarious excretion of earlier contrast administration. Gallbladder is otherwise unremarkable. No pericholecystic  inflammation. No bile duct  dilatation is seen. Pancreas: Unremarkable. No pancreatic ductal dilatation or surrounding inflammatory changes. Spleen: Normal in size without focal abnormality. Adrenals/Urinary Tract: 1.3 cm mass associated with the LEFT adrenal gland with Hounsfield unit measurement of 25, stable compared to the earlier CT abdomen of 10/01/2012. No follow-up imaging is recommended for this benign finding. RIGHT adrenal gland is unremarkable. Kidneys are unremarkable without mass, stone or hydronephrosis. No ureteral calculi are identified. Bladder is unremarkable, partially decompressed. Stomach/Bowel: Fairly prominent stool ball in the rectal vault. Rectosigmoid colon is otherwise unremarkable. Diverticulosis is present within the upper sigmoid colon but no focal inflammatory change to suggest acute diverticulitis. Questionable thickening of the walls of the descending colon, with subtle pericolonic inflammation/fluid stranding, suspicious for a localized colitis of infectious or inflammatory nature. Remainder of the colon is unremarkable. Appendix is not seen but there are no inflammatory changes about the cecum to suggest acute appendicitis. Stomach is unremarkable, partially decompressed. Vascular/Lymphatic: Extensive aortic atherosclerosis. Infrarenal abdominal aorta is grossly stable in caliber. 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 Bypass graft again noted from the RIGHT common iliac artery to the RIGHT kidney. No acute-appearing vascular abnormality is identified, although characterization is limited by the lack of IV contrast. No enlarged lymph nodes are seen within the abdomen or pelvis. Reproductive: Uterus and bilateral adnexa are unremarkable. Other: No free fluid, hemorrhage or abscess collection is seen within the abdomen or pelvis. No free intraperitoneal air. Musculoskeletal: Chronic compression fracture deformity  of the T11 vertebral body, status post earlier vertebroplasty, stable appearance compared to the earlier CT abdomen of 10/01/2012. No acute-appearing osseous abnormality. IMPRESSION: 1. Questionable thickening of the walls of the descending colon, with subtle pericolonic inflammation/fluid stranding, suspicious for a localized mild colitis of infectious or inflammatory nature. No bowel obstruction or evidence of small bowel inflammation. 2. Small bilateral pleural effusions. The LEFT-sided pleural effusion is significantly decreased compared to the chest CT angiogram of 10/23/2021, status post drainage. There is associated bibasilar atelectasis. 3. Additional dense mass-like consolidations are seen within the LEFT lower lung, both possibly pleural based, lateral component measuring 6.6 x 4.1 cm and posterior-medial component measuring 7 x 3 cm. The posterior-medial masslike consolidation was also described on the chest CT angiogram report of 10/23/2021 and are highly suspicious for neoplastic masses. 4. Fairly prominent stool ball in the rectal vault. 5. Sigmoid colon diverticulosis without evidence of acute diverticulitis. 6. Small amount of layering sludge and/or stones within the gallbladder versus vicarious excretion of earlier contrast administration. No evidence of acute cholecystitis. Aortic Atherosclerosis (ICD10-I70.0). Electronically Signed   By: Franki Cabot M.D.   On: 10/25/2021 12:55   [place] EKG SITE RITE  Result Date: 10/25/2021 If Site Rite image not attached, placement could not be confirmed due to current cardiac rhythm.   Cardiac Studies   ECHO: 2018 - Left ventricle: The cavity size was normal. Systolic function was    vigorous. The estimated ejection fraction was in the range of 65%    to 70%. Wall motion was normal; there were no regional wall    motion abnormalities. Doppler parameters are consistent with    abnormal left ventricular relaxation (grade 1 diastolic    dysfunction).  Doppler parameters are consistent with    indeterminate ventricular filling pressure.  - Aortic valve: Valve mobility was restricted. There was moderate    stenosis. There was no regurgitation. Peak velocity (S): 381    cm/s. Mean gradient (  S): 35 mm Hg.  - Mitral valve: Calcified annulus. Transvalvular velocity was    within the normal range. There was no evidence for stenosis.    There was trivial regurgitation.  - Right ventricle: The cavity size was normal. Wall thickness was    normal. Systolic function was normal.  - Atrial septum: No defect or patent foramen ovale was identified    by color flow Doppler.  - Tricuspid valve: There was mild regurgitation.  - Pulmonary arteries: Systolic pressure was mildly increased. PA    peak pressure: 41 mm Hg (S).    R/L HEART CATH: 06/2011 Hemodynamics:                                     RA 2                                     RV 32/3                                     PA 34/11  mean 22                                     PCWP Mean 11                                     LV 161/18                                     AO 146/96                                     AV area  1.74                                     AV gradient (mean)  6.72             Oxygen saturations:                                     PA 64                                     AO 95               Cardiac Output (Fick) 4.2                               Cardiac Index (Fick) 2.2              Coronary angiography:   Coronary dominance: Right   Left mainstem: Normal   Left anterior descending (LAD): Mild luminal irregularities.  Small diagonals normal.  Left circumflex (LCx): RI large and normal.  AV groove mild luminal irregularities   Right coronary artery (RCA): Normal.  PDA large normal.  PL small to moderate and normal.   Left ventriculography: Left ventricular systolic function is normal, LVEF is estimated at 55-65%, there is no significant mitral  regurgitation    Final Conclusions:  Normal coronaries.  NL LF function.  Only mild AS by this measurement.  Echo suggests severe.  Pulmonary pressures and EDP are not elevated.   Recommendations:  Based on this the aortic valve does not appear to be the etiology for her dypsnea.  No further cardiac work up.       Minus Breeding 06/27/2011, 11:13 AM  Patient Profile     87 y.o. female with atrial fibrillation paroxysmal, left pleural effusion exudative recent mechanical fall secondary to weakness dehydration, left lower lobe mass  Assessment & Plan    Atrial fibrillation - Avoiding anticoagulation she just received lytics via chest tube this admission>> risk of bleeding outweighs benefits at this point.  Frail. - Maintain normal sinus rhythm on p.o. amio 200 mg twice daily --Continue amiodarone 200 mg twice daily  Aortic stenosis - low gradient low flow severe AS with Mean gradient 22 mmHg but DVI low at 0.24 and AVA 0.7cm2, not currently a candidate for TAVR -need to try to avoid hypotension  Pleural effusion and new left lower lobe lung mass - Per primary team  Hypotension -unclear etiology>>?afib with RVR and  IV Amio in setting of severe AS vs. Large fluid shifts with Chest tube output.  BP much improved on Levo gtt and hydrocortisone>>cortisol level pending (has known pituitary adenmoa with multiple resections) --2D echo with normal LVF  --Amio IV gtt off --BP is much improved this morning  Performed by: Fransico Him, MD   Total critical care time: 30 minutes   Critical care time was exclusive of separately billable procedures and treating other patients.   Critical care was necessary to treat or prevent imminent or life-threatening deterioration.   Critical care was time spent personally by me (independent of APPs or residents) on the following activities: development of treatment plan with patient and/or surrogate as well as nursing, discussions with consultants,  evaluation of patient's response to treatment, examination of patient, obtaining history from patient or surrogate, ordering and performing treatments and interventions, ordering and review of laboratory studies, ordering and review of radiographic studies, pulse oximetry and re-evaluation of patient's condition.   For questions or updates, please contact [place] Please consult [email or web address] for contact info under        Signed, Fransico Him, MD  10/27/2021, 8:16 AM

## 2021-10-27 NOTE — Telephone Encounter (Signed)
   Front desk  - Pls give followup for [name] in 6 weks with me or APP for pneumonia hospital followup. Needs CCT chest without contrast    SIGNATURE    Dr. Brand Males, M.D., F.C.C.P,  Pulmonary and Critical Care Medicine Staff Physician, [place] Director - Interstitial Lung Disease  Program  Medical Director - [facility] [place] at [place], [place], [address]  NPI Number:  NPI #[id number] [facility] Number: [id number]  Pager: [phone], If no answer  -[place] or [facility] [place] Telephone (clinical office): [phone] Telephone (research): [phone]  9:16 AM 10/27/2021

## 2021-10-27 NOTE — Progress Notes (Signed)
   Suddeny asymptomati hypotension again  Plan  - stat labs - cbc, bmet, PCT, trop, lactic acid, cortisol  - stat ekg  - stat 500cc fluids bolus - cancel tx of out of ICU  10 min critical care   SIGNATURE    Dr. Brand Males, M.D., F.C.C.P,  Pulmonary and Critical Care Medicine Staff Physician, [place] Director - Interstitial Lung Disease  Program  Medical Director - [facility] [place] [facility] [place] at [place], [place], [address]  NPI Number:  NPI #[id number] [facility] Number: [id number]  Pager: [phone], If no answer  -[place] or [facility] [place] Telephone (clinical office): [phone] Telephone (research): [phone]  10:17 AM 10/27/2021

## 2021-10-28 DIAGNOSIS — R918 Other nonspecific abnormal finding of lung field: Secondary | ICD-10-CM

## 2021-10-28 DIAGNOSIS — J90 Pleural effusion, not elsewhere classified: Secondary | ICD-10-CM | POA: Diagnosis not present

## 2021-10-28 DIAGNOSIS — I10 Essential (primary) hypertension: Secondary | ICD-10-CM | POA: Diagnosis not present

## 2021-10-28 DIAGNOSIS — I951 Orthostatic hypotension: Secondary | ICD-10-CM | POA: Diagnosis not present

## 2021-10-28 DIAGNOSIS — I700 Atherosclerosis of aorta: Secondary | ICD-10-CM | POA: Diagnosis not present

## 2021-10-28 LAB — CBC
HCT: 34.8 % — ABNORMAL LOW (ref 36.0–46.0)
Hemoglobin: 10.9 g/dL — ABNORMAL LOW (ref 12.0–15.0)
MCH: 27.8 pg (ref 26.0–34.0)
MCHC: 31.3 g/dL (ref 30.0–36.0)
MCV: 88.8 fL (ref 80.0–100.0)
Platelets: 461 10*3/uL — ABNORMAL HIGH (ref 150–400)
RBC: 3.92 MIL/uL (ref 3.87–5.11)
RDW: 16.1 % — ABNORMAL HIGH (ref 11.5–15.5)
WBC: 11.3 10*3/uL — ABNORMAL HIGH (ref 4.0–10.5)
nRBC: 0 % (ref 0.0–0.2)

## 2021-10-28 LAB — COMPREHENSIVE METABOLIC PANEL
ALT: 15 U/L (ref 0–44)
AST: 31 U/L (ref 15–41)
Albumin: 2 g/dL — ABNORMAL LOW (ref 3.5–5.0)
Alkaline Phosphatase: 115 U/L (ref 38–126)
Anion gap: 5 (ref 5–15)
BUN: 30 mg/dL — ABNORMAL HIGH (ref 8–23)
CO2: 27 mmol/L (ref 22–32)
Calcium: 8 mg/dL — ABNORMAL LOW (ref 8.9–10.3)
Chloride: 109 mmol/L (ref 98–111)
Creatinine, Ser: 1.12 mg/dL — ABNORMAL HIGH (ref 0.44–1.00)
GFR, Estimated: 48 mL/min — ABNORMAL LOW (ref >60)
Glucose, Bld: 120 mg/dL — ABNORMAL HIGH (ref 70–99)
Potassium: 3.6 mmol/L (ref 3.5–5.1)
Sodium: 141 mmol/L (ref 135–145)
Total Bilirubin: 0.5 mg/dL (ref 0.3–1.2)
Total Protein: 5.1 g/dL — ABNORMAL LOW (ref 6.5–8.1)

## 2021-10-28 LAB — GLUCOSE, CAPILLARY
Glucose-Capillary: 91 mg/dL (ref 70–99)
Glucose-Capillary: 120 mg/dL — ABNORMAL HIGH (ref 70–99)

## 2021-10-28 LAB — ACTH

## 2021-10-28 LAB — LEGIONELLA PNEUMOPHILA SEROGP 1 UR AG: L. pneumophila Serogp 1 Ur Ag: NEGATIVE

## 2021-10-28 LAB — MAGNESIUM: Magnesium: 1.7 mg/dL (ref 1.7–2.4)

## 2021-10-28 LAB — PHOSPHORUS: Phosphorus: 2.2 mg/dL — ABNORMAL LOW (ref 2.5–4.6)

## 2021-10-28 MED ORDER — MAGNESIUM SULFATE 2 GM/50ML IV SOLN
2.0000 g | Freq: Once | INTRAVENOUS | Status: AC
  Administered 2021-10-28: 2 g via INTRAVENOUS
  Filled 2021-10-28: qty 50

## 2021-10-28 MED ORDER — METOPROLOL TARTRATE 5 MG/5ML IV SOLN
2.5000 mg | Freq: Once | INTRAVENOUS | Status: AC
Start: 2021-10-28 — End: 2021-10-28
  Administered 2021-10-28: 2.5 mg via INTRAVENOUS
  Filled 2021-10-28: qty 5

## 2021-10-28 MED ORDER — METOPROLOL TARTRATE 25 MG PO TABS
12.5000 mg | ORAL_TABLET | Freq: Two times a day (BID) | ORAL | Status: DC
  Administered 2021-10-28 – 2021-11-05 (×15): 12.5 mg via ORAL
  Filled 2021-10-28 (×16): qty 1

## 2021-10-28 MED ORDER — K PHOS MONO-SOD PHOS DI & MONO 155-852-130 MG PO TABS
500.0000 mg | ORAL_TABLET | Freq: Two times a day (BID) | ORAL | Status: AC
  Administered 2021-10-28 – 2021-10-29 (×4): 500 mg via ORAL
  Filled 2021-10-28 (×4): qty 2

## 2021-10-28 MED ORDER — SODIUM CHLORIDE (PF) 0.9 % IJ SOLN
10.0000 mg | Freq: Once | INTRAMUSCULAR | Status: AC
  Administered 2021-10-28: 10 mg via INTRAPLEURAL
  Filled 2021-10-28: qty 10

## 2021-10-28 MED ORDER — AMIODARONE HCL 200 MG PO TABS
200.0000 mg | ORAL_TABLET | Freq: Two times a day (BID) | ORAL | Status: DC
  Administered 2021-10-28 – 2021-11-03 (×13): 200 mg via ORAL
  Filled 2021-10-28 (×13): qty 1

## 2021-10-28 MED ORDER — POTASSIUM CHLORIDE CRYS ER 20 MEQ PO TBCR
20.0000 meq | EXTENDED_RELEASE_TABLET | Freq: Once | ORAL | Status: AC
Start: 2021-10-28 — End: 2021-10-28
  Administered 2021-10-28: 20 meq via ORAL
  Filled 2021-10-28: qty 1

## 2021-10-28 MED ORDER — STERILE WATER FOR INJECTION IJ SOLN
5.0000 mg | Freq: Once | RESPIRATORY_TRACT | Status: AC
  Administered 2021-10-28: 5 mg via INTRAPLEURAL
  Filled 2021-10-28: qty 5

## 2021-10-28 NOTE — Progress Notes (Signed)
Pt in a fib RVR, rate sustaining 120s-130s with stable BP. Plan to give IV Lopressor.

## 2021-10-28 NOTE — Progress Notes (Signed)
Physical Therapy Treatment Patient Details Name: [name] MRN: [id number] DOB: June 07, 1934 Today's Date: 10/28/2021   History of Present Illness [name] is a 87 y.o. female  who presents emergency department 10/23/21 for evaluation of a fall,Chest x-ray concerning for a new large pleural effusion on the left.  CT head and C-spine negative for traumatic injury.  CT PE with large left-sided pleural effusion and a consolidative mass on the left with additional metastatic lesions.  Immediately upon return from the CT scanner, patient flipped into A-fib with RVR and started to become hypotensive. Chest tube placed  with drainage of over 1400 cc of straw-colored fluid.  Patient then admitted to the ICU.PMH:PMH T2DM, pseudogout, HTN, HLD, COPD    PT Comments    Patient reports feeling better, full from breakfast, minimal; pain complaints at CT.   Patient mobilized to sitting with min guard for lines/tubes. Patient  stands with min/mod assist at [facility], able to step to recliner with mod assist and line management. Patient frequently states, 'don't let me fall."  Continue PT for mobility.  Patient maintained on 2 L [place], SPO2 dropped to 87% with activity, returned  > 90%.  Recommendations for follow up therapy are one component of a multi-disciplinary discharge planning process, led by the attending physician.  Recommendations may be updated based on patient status, additional functional criteria and insurance authorization.  Follow Up Recommendations  Skilled nursing-short term rehab (<3 hours/day) Can patient physically be transported by private vehicle: No   Assistance Recommended at Discharge    Patient can return home with the following A little help with walking and/or transfers;A little help with bathing/dressing/bathroom;Assistance with cooking/housework;Assist for transportation;Help with stairs or ramp for entrance   Equipment Recommendations  None recommended by PT     Recommendations for Other Services       Precautions / Restrictions Precautions Precaution Comments: L chest tube, monitor sats, HR Restrictions Weight Bearing Restrictions: No     Mobility  Bed Mobility   Bed Mobility: Supine to Sit     Supine to sit: Min guard     General bed mobility comments: Assist with lines/CT for safety    Transfers Overall transfer level: Needs assistance Equipment used: Rolling walker (2 wheels) Transfers: Sit to/from Stand, Bed to chair/wheelchair/BSC Sit to Stand: Min assist, +2 safety/equipment   Step pivot transfers: +2 safety/equipment, Mod assist       General transfer comment: cues for safety and lines, able to step x 4 to recliner, patient reports fear of falling.    Ambulation/Gait                   Stairs             Wheelchair Mobility    Modified Rankin (Stroke Patients Only)       Balance Overall balance assessment: Needs assistance, History of Falls Sitting-balance support: Bilateral upper extremity supported, Feet unsupported Sitting balance-Leahy Scale: Fair     Standing balance support: During functional activity, Bilateral upper extremity supported, Reliant on assistive device for balance Standing balance-Leahy Scale: Poor                              Cognition Arousal/Alertness: Awake/alert Behavior During Therapy: WFL for tasks assessed/performed, Anxious  Exercises      General Comments        Pertinent Vitals/Pain Pain Assessment Faces Pain Scale: Hurts a little bit Pain Location: Left ribs when moving Pain Descriptors / Indicators: Discomfort, Moaning, Jabbing Pain Intervention(s): Monitored during session    Home Living Family/patient expects to be discharged to:: Private residence Living Arrangements: Alone Available Help at Discharge: Available PRN/intermittently;Family Type of Home:  Apartment Home Access: Level entry       Home Layout: One level Home Equipment: Rollator (4 wheels);Grab bars - tub/shower;Hand held shower head;Shower seat - built in      Prior Function            PT Goals (current goals can now be found in the care plan section) Progress towards PT goals: Progressing toward goals    Frequency    Min 2X/week      PT Plan Current plan remains appropriate    Co-evaluation PT/OT/SLP Co-Evaluation/Treatment: Yes Reason for Co-Treatment: For patient/therapist safety;To address functional/ADL transfers PT goals addressed during session: Mobility/safety with mobility OT goals addressed during session: ADL's and self-care      AM-PAC PT "6 Clicks" Mobility   Outcome Measure  Help needed turning from your back to your side while in a flat bed without using bedrails?: A Little Help needed moving from lying on your back to sitting on the side of a flat bed without using bedrails?: A Little Help needed moving to and from a bed to a chair (including a wheelchair)?: A Lot Help needed standing up from a chair using your arms (e.g., wheelchair or bedside chair)?: A Lot Help needed to walk in hospital room?: Total Help needed climbing 3-5 steps with a railing? : Total 6 Click Score: 12    End of Session Equipment Utilized During Treatment: Gait belt;Oxygen Activity Tolerance: Patient limited by fatigue Patient left: in chair;with call bell/phone within reach;with chair alarm set Nurse Communication: Mobility status PT Visit Diagnosis: Unsteadiness on feet (R26.81);Difficulty in walking, not elsewhere classified (R26.2);History of falling (Z91.81)     Time: 1010-1035 PT Time Calculation (min) (ACUTE ONLY): 25 min  Charges:  $Therapeutic Activity: 8-22 mins                     Tresa Endo PT Acute Rehabilitation Services Office [phone] Weekend [phone]    Claretha Cooper 10/28/2021, 1:17 PM

## 2021-10-28 NOTE — Procedures (Signed)
Pleural Fibrinolytic Administration Procedure Note  YAIRE KREHER  [id number]  1934-07-19  Date:10/28/21  Time:2:21 PM   Provider Performing:Damisha Wolff R Truth Wolaver   Procedure: Pleural Fibrinolysis Subsequent day (65681)  Indication(s) Fibrinolysis of complicated pleural effusion  Consent Risks of the procedure as well as the alternatives and risks of each were explained to the patient and/or caregiver.  Consent for the procedure was obtained.   Anesthesia None   Time Out Verified patient identification, verified procedure, site/side was marked, verified correct patient position, special equipment/implants available, medications/allergies/relevant history reviewed, required imaging and test results available.   Sterile Technique Hand hygiene, gloves   Procedure Description Existing pleural catheter was cleaned and accessed in sterile manner.  '10mg'$  of tPA in 30cc of saline and '5mg'$  of dornase in 30cc of sterile water were injected into pleural space using existing pleural catheter.  Catheter will be clamped for 1 hour and then placed back to suction.   Complications/Tolerance None; patient tolerated the procedure well.   EBL None   Specimen(s) None   Otilio Carpen Suly Vukelich, PA-C

## 2021-10-28 NOTE — Progress Notes (Addendum)
NAME:  [name], MRN:  [id number], DOB:  November 28, 1934, LOS: 5 ADMISSION DATE:  10/23/2021 CONSULTATION DATE:  10/23/2021 REFERRING MD:  [place] - EDP CHIEF COMPLAINT:  Hypotension in the setting of Afib with RVR   BRIEF  87 year old woman who presented to [facility] ED 9/6 post-mechanical fall, also with decreased PO intake/weakness x 5 days. Hit posterior head while getting out of the bathroom; no LOC. Not on AC. PMHx significant for HTN, HLD, COPD/OSA, T2DM, fibromuscular dysplasia, pseudogout.  Per chart review, patient had been feeling very weak and sustained a mechanical fall when ambulating back from the restroom. She struck her posterior head on the floor but did not have LOC. Denies recent fever/chills, CP/SOB, n/v/d or abdominal pain. Endorses HA s/p fall. EMS noted mild hypotension in the field, initially corrected after fluid administration.  On ED evaluation, afebrile with HR 100s, mildly tachypneic to 25, BP 129/62, SpO2 91% on RA. Labs notable for WBC/H&H WNL, Plt 572, lytes WNL, Cr 1.03 (baseline 0.8), normal AST/ALT, Tbili 1.3. Serum LDH 213, trop WNL. LA 1.6. COVID/Flu negative. CT Head/Cspine NAICA, no fracture. CXR with large L pleural effusion. CTA Chest negative for PE, large left pleural effusion noted with R mediastinal shift, occluded LLL bronchus, concern for poorly defined LLL mass.   PCCM consulted for chest tube placement/new LLL mass. Patient became hypotensive requiring Neosynephrine initiation and was admitted to ICU.  Pertinent Medical History:   has a past medical history of Allergic rhinitis, Arthritis, Complication of anesthesia, COPD (chronic obstructive pulmonary disease) ([place]), Diabetes mellitus, Fibromuscular dysplasia ([place]), Hyperlipidemia, Hypertension, PONV (postoperative nausea and vomiting), Pseudogout, and Sleep apnea.   has a past surgical history that includes pituitary tumor removed; Back surgery (2005); Tonsillectomy; nasal septal deviation; bilateral  renal bypass; and Esophagogastroduodenoscopy (N/A, 10/01/2012).   Significant Hospital Events: Including procedures, antibiotic start and stop dates in addition to other pertinent events   9/6 - Presented to [facility] ED for mechanical fall with weakness, poor PO intake. CT Chest negative for PE, c/f new LLL mass and large L pleural effusion with R mediastinal shift. CT Head/Cspine negative. Chest tube placed.  9/7 intrapleural lytics administered dose 1 -> 750cc  out. -> CT abd lung cut 9/8 with very small residual effusion only  9/8 - circulatory shock with colitis. RUE PICC Placed. Started flagyl IV  10/25/21: approx 222h ago -> suddent shock with vomitting and abd pain. CT with Descending COLITIS. -> Flagyl started. On levophed and hydrocor. ECHO with severe AS and EF > 70% and hyperdymanic -> gioven fluids -> this AM  feels better. Looks better. Hungry and wants to eat. cPAP at her side - says she used 15 years ago for 1-2 years and tried last night but did not tolerate. Per RN - not making urine. -> CREAT UP 1.'04mg'$ % AMio gtt off x 22h. HR 88 - > monitor says sinus. On levophed 37mg -> MAP 70 with low diast. NSR 9/11  Overall improved, off pressors, episode of Afib overnight now improved, no pain, no complaints, still has chest tube in place with minimal output, tolerating diet    Interim History / Subjective:   Episode of Afib RVR overnight, pt was asymptomatic and normotensive.  Given BB and improved No complaints this AM CT output 100cc over last 12hrs, to suction 1900cc UOP yesterday, -486cc since admission   Objective:  Blood pressure (!) 130/57, pulse 90, temperature 97.8 F (36.6 C), temperature source Oral, resp. rate 13, height '5\' 3"'$  (  1.6 m), weight 60 kg, SpO2 92 %.        Intake/Output Summary (Last 24 hours) at 10/28/2021 0750 Last data filed at 10/28/2021 7616 Gross per 24 hour  Intake 1164.92 ml  Output 1990 ml  Net -825.08 ml    Filed Weights   10/26/21 0400 10/27/21  0443 10/28/21 0500  Weight: 60 kg 60 kg 60 kg       General:  thin, elderly F, resting in bed with no complaints HEENT: MM pink/moist, sclera anicteric Neuro: awake, arousable and oriented, following commands CV: s1s2 rrr, no r/g, holosystolic murmur PULM:  clear bilaterally without rhonchi or wheezing, L chest tube in place with 100cc dark serosanguinous drainage in [place] over last shift, [place] 3L without distress  GI: soft, non-tender Extremities: warm/dry, trace pedal edema  Skin: no rashes or lesions   Labs reviewed Phos 2.2. Mag 1.7 Creatinine 1.12 WBC 11   Resolved Problems Shock AKI    Assessment & Plan:    Acute Hypoxic Respiratory Failure secondary to L pleural effusion History OSA Large left pleural effusion s/p chest tube placement 9/6 with 2L output and s/p fibrinolytic 10/24/21 with anotuer 750cc. Cytology 9/6 - non diagnostic. LDH 500 ->  Plan -stable on 2L [place], CXR yesterday with remaining L pleural effusion -will give second dose intrapleural fibrinolytics today and repeat CXR tomorrow - chest tube to remain for now with suciton -CPAP ordered if tolerates, however does not use at home     New LLL mass 6cm pleural based   CTA Chest 9/6 with large L pleural effusion noted with R mediastinal shift, occluded LLL bronchus, concern for poorly defined LLL mass. -> 6cm pleural based mass on CT 10/25/21. IR consult 10/26/21 Plan  - Per IR : short term followup with repeat CT chest in 6 weeks before bx attempted - empiric cefepime 10/27/21 x  total 7 days  - await urine leg, urine strep negative - needs Pulmonary Followup -> message sent to front desk at Pulmonary clinic   Afib with RVR Severe aortic stenosis -continue po amiodarone and consider low dose BB per cardiology  cards recommending anticoagualtion when chest tube fibrinolytics not an issue  Plan  - management per cards  - avoid over diuresis - continue lipitor   [place] Colitis   Initially had  abdominal pain and vomiting now improved Plan - regular diet - monitor - Flagyl since 10/25/21 - course to complete total 8-9 days  Anemia of critical ilness - onset 10/27/21, mild -monitor CBC and transfuse as needed    Mechanical fall secondary to weakness and severe fraility-  CT Head/Cspine negative for AICA/fracture. Plan - PT recommending short term SNF.    Best practice:  Diet: regular 10/26/21  Pain/Anxiety/Delirium protocol (if indicated): x  VAP protocol (if indicated): x  DVT prophylaxis: SQ lovenox. Will need fulll AC asap due to a fib -> ? After chest tube comes out. Needs cards opinion  GI prophylaxis: x  Glucose control: ssi  Mobility: bed rest  Code Status: DNR but full medical care  Family Communication:  Jeanmarie Mccowen [phone] -> updated son in law 10/26/21.and later the daughter . Daughter again updated 10/27/21  Disposition: per primary      LABS    PULMONARY Recent Labs  Lab 10/25/21 1033  PHART 7.5*  PCO2ART 27*  PO2ART 93  HCO3 21.1  O2SAT 99.5     CBC Recent Labs  Lab 10/27/21 0433 10/27/21 1021 10/28/21  0440  HGB 11.3* 11.1* 10.9*  HCT 35.5* 34.9* 34.8*  WBC 13.3* 13.0* 11.3*  PLT 472* 485* 461*     COAGULATION Recent Labs  Lab 10/25/21 1048  INR 1.5*     CARDIAC  No results for input(s): "TROPONINI" in the last 168 hours. No results for input(s): "PROBNP" in the last 168 hours.   CHEMISTRY Recent Labs  Lab 10/24/21 0008 10/25/21 1048 10/26/21 0422 10/27/21 0433 10/27/21 1021 10/28/21 0440  NA 138 132* 132* 133* 137 141  K 3.9 3.3* 3.8 4.0 3.8 3.6  CL 104 100 99 101 105 109  CO2 20* 21* '24 25 24 27  '$ GLUCOSE 105* 210* 167* 127* 144* 120*  BUN 19 24* 29* 34* 35* 30*  CREATININE 0.89 0.99 1.04* 1.01* 1.17* 1.12*  CALCIUM 8.8* 8.7* 8.4* 7.8* 7.9* 8.0*  MG 1.6*  --  2.0  --   --  1.7  PHOS 3.5  --   --   --   --  2.2*    Estimated Creatinine Clearance: 30.4 mL/min (A) (by C-G formula based on  SCr of 1.12 mg/dL (H)).   LIVER Recent Labs  Lab 10/23/21 1545 10/25/21 1048 10/26/21 0422 10/27/21 1021 10/28/21 0440  AST 16 12* 12* 18 31  ALT '9 8 7 9 15  '$ ALKPHOS 115 92 112 101 115  BILITOT 1.3* 0.6 0.7 0.4 0.5  PROT 7.0  7.1 5.3* 5.3* 4.8* 5.1*  ALBUMIN 3.2* 2.2* 2.3* 2.0* 2.0*  INR  --  1.5*  --   --   --       INFECTIOUS Recent Labs  Lab 10/25/21 1350 10/25/21 1725 10/26/21 0422 10/27/21 0433 10/27/21 1021  LATICACIDVEN 1.7 1.6  --   --  2.4*  PROCALCITON  --   --  1.14 0.90 0.83      ENDOCRINE CBG (last 3)  Recent Labs    10/26/21 1643 10/27/21 0814 10/27/21 1634  GLUCAP 237* 120* 139*          IMAGING x48h  - image(s) personally visualized  -   highlighted in bold DG CHEST PORT 1 VIEW  Result Date: 10/27/2021 CLINICAL DATA:  Left-sided chest tube. EXAM: PORTABLE CHEST 1 VIEW COMPARISON:  10/25/2021 FINDINGS: Right-sided PICC line unchanged with tip over the SVC. Left basilar chest tube unchanged. No evidence of left-sided pneumothorax. Stable opacification over the left lung base likely small effusion with associated atelectasis. Slight worsening hazy attenuation over the left mid to lower lung which may be due to layering pleural fluid and less likely asymmetric edema/infection. Right lung is clear. Cardiomediastinal silhouette and remainder of the exam is unchanged. IMPRESSION: 1. Stable left base opacification likely small effusion with associated atelectasis. Slight worsening hazy attenuation over the left mid to lower lung which may be due to layering pleural fluid and less likely asymmetric edema/infection. 2. Tubes and lines as described. Electronically Signed   By: Marin Olp M.D.   On: 10/27/2021 10:03      Otilio Carpen Elvan Ebron, PA-C [facility] See Amion for pager If no response to pager , please call [phone] until 7pm After 7:00 pm call Elink  [phone]?[place]

## 2021-10-28 NOTE — Evaluation (Signed)
Occupational Therapy Evaluation Patient Details Name: [name] MRN: [id number] DOB: February 07, 1935 Today's Date: 10/28/2021   History of Present Illness [name] is a 87 y.o. female  who presents emergency department 10/23/21 for evaluation of a fall,Chest x-ray concerning for a new large pleural effusion on the left.  CT head and C-spine negative for traumatic injury.  CT PE with large left-sided pleural effusion and a consolidative mass on the left with additional metastatic lesions.  Immediately upon return from the CT scanner, patient flipped into A-fib with RVR and started to become hypotensive. Chest tube placed  with drainage of over 1400 cc of straw-colored fluid.  Patient then admitted to the ICU.PMH:PMH T2DM, pseudogout, HTN, HLD, COPD   Clinical Impression   Patient is a 87 year old female who was admitted for above. Patient was living at home alone prior level. Currently, patient is limited in functional mobility with chest tube connected to suction. Patient was +2 for safety with line management on this date.Patient was max A for LB dressing tasks with pain in L side of ribs with movement.  Patient was noted to have decreased functional activity tolerance, decreased endurance, decreased standing balance, decreased safety awareness, and decreased knowledge of AD/AE impacting participation in ADLs. Patient would continue to benefit from skilled OT services at this time while admitted and after d/c to address noted deficits in order to improve overall safety and independence in ADLs.          Recommendations for follow up therapy are one component of a multi-disciplinary discharge planning process, led by the attending physician.  Recommendations may be updated based on patient status, additional functional criteria and insurance authorization.   Follow Up Recommendations  Skilled nursing-short term rehab (<3 hours/day)    Assistance Recommended at Discharge Frequent  or constant Supervision/Assistance  Patient can return home with the following A lot of help with walking and/or transfers;A lot of help with bathing/dressing/bathroom;Assistance with cooking/housework;Direct supervision/assist for financial management;Assist for transportation;Direct supervision/assist for medications management;Help with stairs or ramp for entrance    Functional Status Assessment  Patient has had a recent decline in their functional status and demonstrates the ability to make significant improvements in function in a reasonable and predictable amount of time.  Equipment Recommendations  Other (comment) (defer to next venue)    Recommendations for Other Services       Precautions / Restrictions Precautions Precaution Comments: L chest tube, monitor sats, HR Restrictions Weight Bearing Restrictions: No      Mobility Bed Mobility                    Transfers                          Balance                                           ADL either performed or assessed with clinical judgement   ADL Overall ADL's : Needs assistance/impaired                                             Vision Patient Visual Report: No change from baseline       Perception  Praxis      Pertinent Vitals/Pain Pain Assessment Pain Assessment: Faces Faces Pain Scale: Hurts a little bit Pain Location: Left ribs when moving Pain Descriptors / Indicators: Discomfort, Moaning, Jabbing Pain Intervention(s): Monitored during session     Hand Dominance     Extremity/Trunk Assessment Upper Extremity Assessment Upper Extremity Assessment: Generalized weakness;LUE deficits/detail;RUE deficits/detail RUE Deficits / Details: reported h/o "locking up" on her currently ROM WFL LUE Deficits / Details: patient reported this UE was very weak. only slightly different from [place].   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     General Comments       Exercises     Shoulder Instructions      Home Living                                          Prior Functioning/Environment                          OT Problem List: Decreased activity tolerance;Decreased coordination;Decreased knowledge of use of DME or AE;Decreased safety awareness;Decreased knowledge of precautions;Cardiopulmonary status limiting activity;Pain      OT Treatment/Interventions: Therapeutic exercise;Self-care/ADL training;Neuromuscular education;Energy conservation;DME and/or AE instruction;Therapeutic activities;Balance training;Patient/family education    OT Goals(Current goals can be found in the care plan section) Acute Rehab OT Goals Patient Stated Goal: to get better OT Goal Formulation: With patient Time For Goal Achievement: 11/11/21 Potential to Achieve Goals: Fair  OT Frequency: Min 2X/week    Co-evaluation   Reason for Co-Treatment: For patient/therapist safety;To address functional/ADL transfers PT goals addressed during session: Mobility/safety with mobility OT goals addressed during session: ADL's and self-care      AM-PAC OT "6 Clicks" Daily Activity     Outcome Measure Help from another person eating meals?: A Little Help from another person taking care of personal grooming?: A Little Help from another person toileting, which includes using toliet, bedpan, or urinal?: A Lot Help from another person bathing (including washing, rinsing, drying)?: A  Lot Help from another person to put on and taking off regular upper body clothing?: A Little Help from another person to put on and taking off regular lower body clothing?: A Lot 6 Click Score: 15   End of Session Equipment Utilized During Treatment: Gait belt;Rolling walker (2 wheels) Nurse Communication: Mobility status  Activity Tolerance: Patient tolerated treatment well Patient left: in chair;with call bell/phone within reach;with chair alarm set  OT Visit Diagnosis: Unsteadiness on feet (R26.81);Muscle weakness (generalized) (M62.81);History of falling (Z91.81)                Time: 0539-7673 OT Time Calculation (min): 18 min Charges:  OT General Charges $OT Visit: 1 Visit OT Evaluation $OT Eval Moderate Complexity: 1 Mod  Dayven Linsley OTR/L, MS Acute Rehabilitation Department Office# [phone]   Marcellina Millin 10/28/2021, 4:19 PM

## 2021-10-28 NOTE — TOC Progression Note (Signed)
Transition of Care [facility]) - Progression Note    Patient Details  Name: [name] MRN: [id number] Date of Birth: 1934-10-26  Transition of Care [facility]) CM/SW Contact  Leeroy Cha, RN Phone Number: 10/28/2021, 8:29 AM  Clinical Narrative:    From [facility], may need snf short term.  In a.fib with rvr this day.   Expected Discharge Plan: Home/Self Care Barriers to Discharge: Continued Medical Work up  Expected Discharge Plan and Services Expected Discharge Plan: Home/Self Care   Discharge Planning Services: CM Consult   Living arrangements for the past 2 months: Apartment                                       Social Determinants of Health (SDOH) Interventions    Readmission Risk Interventions   Row Labels 02/08/2020    1:57 PM  Readmission Risk Prevention Plan   Section Header. No data exists in this row.   Post Dischage Appt   Complete  Medication Screening   Complete  Transportation Screening   Complete

## 2021-10-28 NOTE — Telephone Encounter (Signed)
Scheduled for HFU on 10/23 with Tammy. This will be printed on discharge papers.

## 2021-10-28 NOTE — Progress Notes (Signed)
PROGRESS NOTE    [name]  IWL:[id number] DOB: [birth year] DOA: 10/23/2021 PCP: [name], MD    Chief Complaint  Patient presents with   Fall    Brief Narrative:  87 year old woman who presented to [facility] ED 9/6 post-mechanical fall, also with decreased PO intake/weakness x 5 days  pt reporting hitting  posterior head while getting out of the bathroom, no loss of conscious ness. CT Head/Cspine NAICA, no fracture. CXR with large L pleural effusion. CTA Chest negative for PE, large left pleural effusion noted with R mediastinal shift, occluded LLL bronchus, concern for poorly defined LLL mass. PCCM consulted for chest tube placement/new LLL mass. Patient became hypotensive requiring Neosynephrine initiation and was admitted to ICU. She is weaned off vasopressors. She currently has chest tube placed on 10/23/21.   Assessment & Plan:   Principal Problem:   Pleural effusion on left Active Problems:   Lung mass   Paroxysmal atrial fibrillation with RVR (HCC)   Pituitary adenoma (HCC)   Hyperlipidemia   Essential hypertension   Obstructive sleep apnea   Type 2 diabetes mellitus with neurological complications (HCC)   COPD (chronic obstructive pulmonary disease) (HCC)   Aortic atherosclerosis and peripheral vascular disease   Hypotension   Acute respiratory failure with hypoxia (HCC)   Hypomagnesemia   Shock circulatory (HCC)   Severe aortic stenosis   Shock ([place])   Acute respiratory failure with hypoxia secondary to a large left pleural effusion.  S/p evacuation with chest tube placement on 10/23/2021, s/p fibrinolytic on 10/24/2021, PCCM to repeat the dose of fibrinolysis today. Currently on 3 lit of [place] oxygen to keep sats greater than 90%.   Cytology non diagnostic.  Chest tube to remain for now.  Continue with CPAP if she is agreeable.     New left lower lobe mass 6 cm and pleural based:  IR consulted for a biopsy, recommended repeat CT in 6 weeks before biopsy  is attempted.  Continue with empiric antibiotics for a total of 7 days.    Atrial fibrillation with RVR, severe aortic stenosis Rate much better today.  Continue with amiodarone dosing as per cardiology.  Holding anti coagulation due to use of fibrinolytics.     Hypertension;  BP parameters have improved.   Circulatory shock secondary to colitis Appears to have resolved.  Continue the course of flagyl for colitis.  Off pressors.  BP parameters have improved.    AKI;  Resolved with IV fluids creatinine back to baseline.     Anemia of critical illness:  Transfuse to keep hemoglobin greater than 8.     Lactic acidosis:  From SIRS.   H/o mechanical fall sec to weakness  Imaging is negative.  Therapy evaluations.   Hypomagnesemia and hypophosphatemia.  Replaced.    DVT prophylaxis: scd's Code Status: DNR Family Communication: none at bedside.  Disposition:   Status is: Inpatient Remains inpatient appropriate because: chest tube placement.    Level of care: ICU Consultants:  PCCM CARDIOLOGY.   Procedures:  Chest tube placement.   Antimicrobials:  Antibiotics Given (last 72 hours)     Date/Time Action Medication Dose Rate   10/25/21 1733 New Bag/Given   metroNIDAZOLE (FLAGYL) IVPB 500 mg 500 mg 100 mL/hr   10/26/21 0439 New Bag/Given   metroNIDAZOLE (FLAGYL) IVPB 500 mg 500 mg 100 mL/hr   10/26/21 1237 New Bag/Given   ceFEPIme (MAXIPIME) 2 g in sodium chloride 0.9 % 100 mL IVPB 2 g 200 mL/hr  10/26/21 1653 New Bag/Given   metroNIDAZOLE (FLAGYL) IVPB 500 mg 500 mg 100 mL/hr   10/27/21 0026 New Bag/Given   ceFEPIme (MAXIPIME) 2 g in sodium chloride 0.9 % 100 mL IVPB 2 g 200 mL/hr   10/27/21 1300 New Bag/Given   ceFEPIme (MAXIPIME) 2 g in sodium chloride 0.9 % 100 mL IVPB 2 g 200 mL/hr   10/27/21 1600 New Bag/Given   metroNIDAZOLE (FLAGYL) IVPB 500 mg 500 mg 100 mL/hr   10/28/21 0015 New Bag/Given   ceFEPIme (MAXIPIME) 2 g in sodium chloride 0.9 %  100 mL IVPB 2 g 200 mL/hr   10/28/21 0434 New Bag/Given   metroNIDAZOLE (FLAGYL) IVPB 500 mg 500 mg 100 mL/hr       Subjective: Feeling good. Wants to know when she can go home.   Objective: Vitals:   10/28/21 0657 10/28/21 0700 10/28/21 0754 10/28/21 0800  BP:  (!) 130/57  (!) 125/49  Pulse:  90 80 81  Resp:  '13 18 16  '$ Temp: 97.8 F (36.6 C)     TempSrc: Oral     SpO2:  92% 91% 90%  Weight:      Height:        Intake/Output Summary (Last 24 hours) at 10/28/2021 0905 Last data filed at 10/28/2021 0755 Gross per 24 hour  Intake 1163.21 ml  Output 2050 ml  Net -886.79 ml   Filed Weights   10/26/21 0400 10/27/21 0443 10/28/21 0500  Weight: 60 kg 60 kg 60 kg    Examination:  General exam: Appears calm and comfortable  Respiratory system: diminished air entry at bases, on 3 lit of [place] oxygen.  Cardiovascular system: S1 & S2 heard, RRR. No JVD,  No pedal edema. Gastrointestinal system: Abdomen is nondistended, soft and nontender. Normal bowel sounds heard. Central nervous system: Alert and oriented. No focal neurological deficits. Extremities: Symmetric 5 x 5 power. Skin: No rashes, lesions or ulcers Psychiatry:  Mood & affect appropriate.     Data Reviewed: I have personally reviewed following labs and imaging studies  CBC: Recent Labs  Lab 10/23/21 1545 10/24/21 0008 10/25/21 1048 10/27/21 0433 10/27/21 1021 10/28/21 0440  WBC 10.3 9.6 12.8* 13.3* 13.0* 11.3*  NEUTROABS 8.3*  --   --   --   --   --   HGB 13.6 12.4 13.5 11.3* 11.1* 10.9*  HCT 43.4 40.6 42.8 35.5* 34.9* 34.8*  MCV 89.9 92.3 89.2 88.5 89.5 88.8  PLT 572* 479* 626* 472* 485* 461*    Basic Metabolic Panel: Recent Labs  Lab 10/24/21 0008 10/25/21 1048 10/26/21 0422 10/27/21 0433 10/27/21 1021 10/28/21 0440  NA 138 132* 132* 133* 137 141  K 3.9 3.3* 3.8 4.0 3.8 3.6  CL 104 100 99 101 105 109  CO2 20* 21* '24 25 24 27  '$ GLUCOSE 105* 210* 167* 127* 144* 120*  BUN 19 24* 29* 34* 35* 30*   CREATININE 0.89 0.99 1.04* 1.01* 1.17* 1.12*  CALCIUM 8.8* 8.7* 8.4* 7.8* 7.9* 8.0*  MG 1.6*  --  2.0  --   --  1.7  PHOS 3.5  --   --   --   --  2.2*    GFR: Estimated Creatinine Clearance: 30.4 mL/min (A) (by C-G formula based on SCr of 1.12 mg/dL (H)).  Liver Function Tests: Recent Labs  Lab 10/23/21 1545 10/25/21 1048 10/26/21 0422 10/27/21 1021 10/28/21 0440  AST 16 12* 12* 18 31  ALT '9 8 7 9 15  '$ ALKPHOS  115 92 112 101 115  BILITOT 1.3* 0.6 0.7 0.4 0.5  PROT 7.0  7.1 5.3* 5.3* 4.8* 5.1*  ALBUMIN 3.2* 2.2* 2.3* 2.0* 2.0*    CBG: Recent Labs  Lab 10/26/21 0718 10/26/21 1643 10/27/21 0814 10/27/21 1634 10/28/21 0757  GLUCAP 173* 237* 120* 139* 91     Recent Results (from the past 240 hour(s))  Resp Panel by RT-PCR (Flu A&B, Covid) Anterior Nasal Swab     Status: None   Collection Time: 10/23/21  4:21 PM   Specimen: Anterior Nasal Swab  Result Value Ref Range Status   SARS Coronavirus 2 by RT PCR NEGATIVE NEGATIVE Final    Comment: (NOTE) SARS-CoV-2 target nucleic acids are NOT DETECTED.  The SARS-CoV-2 RNA is generally detectable in upper respiratory specimens during the acute phase of infection. The lowest concentration of SARS-CoV-2 viral copies this assay can detect is 138 copies/mL. A negative result does not preclude SARS-Cov-2 infection and should not be used as the sole basis for treatment or other patient management decisions. A negative result may occur with  improper specimen collection/handling, submission of specimen other than nasopharyngeal swab, presence of viral mutation(s) within the areas targeted by this assay, and inadequate number of viral copies(<138 copies/mL). A negative result must be combined with clinical observations, patient history, and epidemiological information. The expected result is Negative.  Fact Sheet for Patients:  EntrepreneurPulse.com.au  Fact Sheet for Healthcare Providers:   IncredibleEmployment.be  This test is no t yet approved or cleared by the [place] FDA and  has been authorized for detection and/or diagnosis of SARS-CoV-2 by FDA under an Emergency Use Authorization (EUA). This EUA will remain  in effect (meaning this test can be used) for the duration of the COVID-19 declaration under Section 564(b)(1) of the Act, 21 U.S.C.section 360bbb-3(b)(1), unless the authorization is terminated  or revoked sooner.       Influenza A by PCR NEGATIVE NEGATIVE Final   Influenza B by PCR NEGATIVE NEGATIVE Final    Comment: (NOTE) The Xpert Xpress SARS-CoV-2/FLU/RSV plus assay is intended as an aid in the diagnosis of influenza from Nasopharyngeal swab specimens and should not be used as a sole basis for treatment. Nasal washings and aspirates are unacceptable for Xpert Xpress SARS-CoV-2/FLU/RSV testing.  Fact Sheet for Patients: EntrepreneurPulse.com.au  Fact Sheet for Healthcare Providers: IncredibleEmployment.be  This test is not yet approved or cleared by the [place] FDA and has been authorized for detection and/or diagnosis of SARS-CoV-2 by FDA under an Emergency Use Authorization (EUA). This EUA will remain in effect (meaning this test can be used) for the duration of the COVID-19 declaration under Section 564(b)(1) of the Act, 21 U.S.C. section 360bbb-3(b)(1), unless the authorization is terminated or revoked.  Performed at [facility], [place] [address], [place], [place] [address]   Body fluid culture w Gram Stain     Status: None   Collection Time: 10/23/21  7:19 PM   Specimen: Pleura; Body Fluid  Result Value Ref Range Status   Specimen Description   Final    PLEURAL Performed at [address], [place], [place] [address]    Special Requests   Final    NONE Performed at [facility], [place] [address],  [place], [place] [address]    Gram Stain   Final    NO ORGANISMS SEEN RARE WBC PRESENT, PREDOMINANTLY PMN    Culture   Final    NO GROWTH  3 DAYS Performed at [facility], [place] [address], [place], [place] [address]    Report Status 10/27/2021 FINAL  Final  MRSA Next Gen by PCR, Nasal     Status: None   Collection Time: 10/23/21  9:53 PM   Specimen: Nasal Mucosa; Nasal Swab  Result Value Ref Range Status   MRSA by PCR Next Gen NOT DETECTED NOT DETECTED Final    Comment: (NOTE) The GeneXpert MRSA Assay (FDA approved for NASAL specimens only), is one component of a comprehensive MRSA colonization surveillance program. It is not intended to diagnose MRSA infection nor to guide or monitor treatment for MRSA infections. Test performance is not FDA approved in patients less than 13 years old. Performed at [facility], [place] [address] [place], [place] [address]          Radiology Studies: DG CHEST PORT 1 VIEW  Result Date: 10/27/2021 CLINICAL DATA:  Left-sided chest tube. EXAM: PORTABLE CHEST 1 VIEW COMPARISON:  10/25/2021 FINDINGS: Right-sided PICC line unchanged with tip over the SVC. Left basilar chest tube unchanged. No evidence of left-sided pneumothorax. Stable opacification over the left lung base likely small effusion with associated atelectasis. Slight worsening hazy attenuation over the left mid to lower lung which may be due to layering pleural fluid and less likely asymmetric edema/infection. Right lung is clear. Cardiomediastinal silhouette and remainder of the exam is unchanged. IMPRESSION: 1. Stable left base opacification likely small effusion with associated atelectasis. Slight worsening hazy attenuation over the left mid to lower lung which may be due to layering pleural fluid and less likely asymmetric edema/infection. 2. Tubes and lines as described. Electronically Signed   By: Marin Olp M.D.   On: 10/27/2021 10:03        Scheduled Meds:   amiodarone  200 mg Oral BID   atorvastatin  80 mg Oral QHS   Chlorhexidine Gluconate Cloth  6 each Topical Daily   enoxaparin (LOVENOX) injection  40 mg Subcutaneous Q24H   metoprolol tartrate  12.5 mg Oral BID   potassium chloride  20 mEq Oral Once   sodium chloride flush  10 mL Intrapleural Q8H   sodium chloride flush  10-40 mL Intracatheter Q12H   Continuous Infusions:  sodium chloride 10 mL/hr at 10/28/21 0755   ceFEPime (MAXIPIME) IV Stopped (10/28/21 0046)   magnesium sulfate bolus IVPB     metronidazole Stopped (10/28/21 0542)     LOS: 5 days    Time spent: 42 minutes.     Hosie Poisson, MD [facility]   To contact the attending provider between 7A-7P or the covering provider during after hours 7P-7A, please log into the web site [email or web address] and access using universal [place] password for that web site. If you do not have the password, please call the hospital operator.  10/28/2021, 9:05 AM

## 2021-10-28 NOTE — Progress Notes (Signed)
Rounding Note    Patient Name: [name] Date of Encounter: 10/28/2021  [facility] Cardiologist: Candee Furbish, MD \  Subjective   Afib RVR overnight --> received 2.5 mg IV metoprolol with conversion to sinus rhythm.   Inpatient Medications    Scheduled Meds:  amiodarone  200 mg Oral Daily   atorvastatin  80 mg Oral QHS   Chlorhexidine Gluconate Cloth  6 each Topical Daily   enoxaparin (LOVENOX) injection  40 mg Subcutaneous Q24H   sodium chloride flush  10 mL Intrapleural Q8H   sodium chloride flush  10-40 mL Intracatheter Q12H   Continuous Infusions:  sodium chloride 10 mL/hr at 10/28/21 0755   ceFEPime (MAXIPIME) IV Stopped (10/28/21 0046)   metronidazole Stopped (10/28/21 0542)   PRN Meds: acetaminophen, calcium carbonate, docusate sodium, docusate sodium, iohexol, lip balm, mouth rinse, polyethylene glycol, sodium chloride flush   Vital Signs    Vitals:   10/28/21 0657 10/28/21 0700 10/28/21 0754 10/28/21 0800  BP:  (!) 130/57  (!) 125/49  Pulse:  90 80 81  Resp:  '13 18 16  '$ Temp: 97.8 F (36.6 C)     TempSrc: Oral     SpO2:  92% 91% 90%  Weight:      Height:        Intake/Output Summary (Last 24 hours) at 10/28/2021 0841 Last data filed at 10/28/2021 0755 Gross per 24 hour  Intake 1163.21 ml  Output 2050 ml  Net -886.79 ml      10/28/2021    5:00 AM 10/27/2021    4:43 AM 10/26/2021    4:00 AM  Last 3 Weights  Weight (lbs) 132 lb 4.4 oz 132 lb 4.4 oz 132 lb 4.4 oz  Weight (kg) 60 kg 60 kg 60 kg      Telemetry    Sinus --> Afib RVR in the 120s --> sinus rhythm in the 90s - Personally Reviewed  ECG    No new tracings - Personally Reviewed  Physical Exam   GEN: thin elderly female in no acute distress.   Neck: No JVD Cardiac: RRR,+ systolic murmur Respiratory: diminished on left GI: Soft, nontender, non-distended  MS: No edema; No deformity. Neuro:  Nonfocal  Psych: Normal affect   Labs    High Sensitivity Troponin:    Recent Labs  Lab 10/23/21 1545 10/25/21 1048 10/25/21 1248 10/26/21 0422 10/27/21 1021  TROPONINIHS 9 20* 19* 12 9     Chemistry Recent Labs  Lab 10/24/21 0008 10/25/21 1048 10/26/21 0422 10/27/21 0433 10/27/21 1021 10/28/21 0440  NA 138   < > 132* 133* 137 141  K 3.9   < > 3.8 4.0 3.8 3.6  CL 104   < > 99 101 105 109  CO2 20*   < > '24 25 24 27  '$ GLUCOSE 105*   < > 167* 127* 144* 120*  BUN 19   < > 29* 34* 35* 30*  CREATININE 0.89   < > 1.04* 1.01* 1.17* 1.12*  CALCIUM 8.8*   < > 8.4* 7.8* 7.9* 8.0*  MG 1.6*  --  2.0  --   --  1.7  PROT  --    < > 5.3*  --  4.8* 5.1*  ALBUMIN  --    < > 2.3*  --  2.0* 2.0*  AST  --    < > 12*  --  18 31  ALT  --    < > 7  --  9 15  ALKPHOS  --    < > 112  --  101 115  BILITOT  --    < > 0.7  --  0.4 0.5  GFRNONAA >60   < > 53* 55* 46* 48*  ANIONGAP 14   < > '9 7 8 5   '$ < > = values in this interval not displayed.    Lipids No results for input(s): "CHOL", "TRIG", "HDL", "LABVLDL", "LDLCALC", "CHOLHDL" in the last 168 hours.  Hematology Recent Labs  Lab 10/27/21 0433 10/27/21 1021 10/28/21 0440  WBC 13.3* 13.0* 11.3*  RBC 4.01 3.90 3.92  HGB 11.3* 11.1* 10.9*  HCT 35.5* 34.9* 34.8*  MCV 88.5 89.5 88.8  MCH 28.2 28.5 27.8  MCHC 31.8 31.8 31.3  RDW 15.8* 16.1* 16.1*  PLT 472* 485* 461*   Thyroid  Recent Labs  Lab 10/24/21 1037 10/26/21 0422  TSH 0.034*  --   FREET4  --  1.49*    BNP Recent Labs  Lab 10/25/21 1048  BNP 510.4*    DDimer No results for input(s): "DDIMER" in the last 168 hours.   Radiology    DG CHEST PORT 1 VIEW  Result Date: 10/27/2021 CLINICAL DATA:  Left-sided chest tube. EXAM: PORTABLE CHEST 1 VIEW COMPARISON:  10/25/2021 FINDINGS: Right-sided PICC line unchanged with tip over the SVC. Left basilar chest tube unchanged. No evidence of left-sided pneumothorax. Stable opacification over the left lung base likely small effusion with associated atelectasis. Slight worsening hazy attenuation over the  left mid to lower lung which may be due to layering pleural fluid and less likely asymmetric edema/infection. Right lung is clear. Cardiomediastinal silhouette and remainder of the exam is unchanged. IMPRESSION: 1. Stable left base opacification likely small effusion with associated atelectasis. Slight worsening hazy attenuation over the left mid to lower lung which may be due to layering pleural fluid and less likely asymmetric edema/infection. 2. Tubes and lines as described. Electronically Signed   By: Marin Olp M.D.   On: 10/27/2021 10:03    Cardiac Studies   Echo 10/25/21: 1. Calcified aortic valve with concern for low flow, low gradient severe  AS (mean gradient 22 mmHg, AVA 0.7 cm2 and DI 0.24).   2. Left ventricular ejection fraction, by estimation, is >75%. The left  ventricle has hyperdynamic function. The left ventricle has no regional  wall motion abnormalities. There is moderate concentric left ventricular  hypertrophy. Left ventricular  diastolic parameters are consistent with Grade I diastolic dysfunction  (impaired relaxation).   3. Right ventricular systolic function is normal. The right ventricular  size is normal. There is mildly elevated pulmonary artery systolic  pressure.   4. The mitral valve is normal in structure. Trivial mitral valve  regurgitation. No evidence of mitral stenosis.   5. The aortic valve is calcified. Aortic valve regurgitation is trivial.  Severe aortic valve stenosis.   6. The inferior vena cava is normal in size with greater than 50%  respiratory variability, suggesting right atrial pressure of 3 mmHg.    ECHO: 2018 - Left ventricle: The cavity size was normal. Systolic function was    vigorous. The estimated ejection fraction was in the range of 65%    to 70%. Wall motion was normal; there were no regional wall    motion abnormalities. Doppler parameters are consistent with    abnormal left ventricular relaxation (grade 1 diastolic     dysfunction). Doppler parameters are consistent with    indeterminate ventricular filling  pressure.  - Aortic valve: Valve mobility was restricted. There was moderate    stenosis. There was no regurgitation. Peak velocity (S): 381    cm/s. Mean gradient (S): 35 mm Hg.  - Mitral valve: Calcified annulus. Transvalvular velocity was    within the normal range. There was no evidence for stenosis.    There was trivial regurgitation.  - Right ventricle: The cavity size was normal. Wall thickness was    normal. Systolic function was normal.  - Atrial septum: No defect or patent foramen ovale was identified    by color flow Doppler.  - Tricuspid valve: There was mild regurgitation.  - Pulmonary arteries: Systolic pressure was mildly increased. PA    peak pressure: 41 mm Hg (S).    R/L HEART CATH: 06/2011 Hemodynamics:                                     RA 2                                     RV 32/3                                     PA 34/11  mean 22                                     PCWP Mean 11                                     LV 161/18                                     AO 146/96                                     AV area  1.74                                     AV gradient (mean)  6.72             Oxygen saturations:                                     PA 64                                     AO 95               Cardiac Output (Fick) 4.2                               Cardiac Index (Fick) 2.2  Final Conclusions:  Normal coronaries.  NL LF function.  Only mild AS by this measurement.  Echo suggests severe.  Pulmonary pressures and EDP are not elevated.   Recommendations:  Based on this the aortic valve does not appear to be the etiology for her dypsnea.  No further cardiac work up.    Patient Profile     87 y.o. female with atrial fibrillation paroxysmal, left pleural effusion exudative recent mechanical fall secondary to weakness dehydration, left lower  lobe mass. Cardiology was consulted for Afib RVR.  Assessment & Plan    Atrial fibrillation with RVR Afib RVR overnight in the 120s. Fortunately, she easily converted to sinus rhythm after one dose of 2.5 mg IV lopressor. She remains in sinus rhythm this morning.  - continue 200 mg amiodarone - consider adding low does BB   Need for chronic anticoagulation Avoiding anticoagulation for now - recently received lytics via chest tube this admission. Risk of bleeding outweighs benefits at this point.    Aortic stenosis Low gradient low flow severe AS Avoid tachycardia / hypotension   Hypotension Not hypotensive when in sinus rhythm Pressors have been weaned   Pleural effusion Colitis Per primary team Tolerating solid food in room     For questions or updates, please contact [place] Please consult [email or web address] for contact info under        Signed, Ledora Bottcher, PA  10/28/2021, 8:41 AM

## 2021-10-29 ENCOUNTER — Inpatient Hospital Stay (HOSPITAL_COMMUNITY): Payer: Medicare PPO

## 2021-10-29 DIAGNOSIS — I480 Paroxysmal atrial fibrillation: Secondary | ICD-10-CM | POA: Diagnosis not present

## 2021-10-29 DIAGNOSIS — I10 Essential (primary) hypertension: Secondary | ICD-10-CM | POA: Diagnosis not present

## 2021-10-29 DIAGNOSIS — J90 Pleural effusion, not elsewhere classified: Secondary | ICD-10-CM | POA: Diagnosis not present

## 2021-10-29 LAB — CBC
HCT: 34.6 % — ABNORMAL LOW (ref 36.0–46.0)
Hemoglobin: 10.7 g/dL — ABNORMAL LOW (ref 12.0–15.0)
MCH: 27.9 pg (ref 26.0–34.0)
MCHC: 30.9 g/dL (ref 30.0–36.0)
MCV: 90.1 fL (ref 80.0–100.0)
Platelets: 430 10*3/uL — ABNORMAL HIGH (ref 150–400)
RBC: 3.84 MIL/uL — ABNORMAL LOW (ref 3.87–5.11)
RDW: 16.2 % — ABNORMAL HIGH (ref 11.5–15.5)
WBC: 11.2 10*3/uL — ABNORMAL HIGH (ref 4.0–10.5)
nRBC: 0 % (ref 0.0–0.2)

## 2021-10-29 LAB — PHOSPHORUS: Phosphorus: 3.4 mg/dL (ref 2.5–4.6)

## 2021-10-29 LAB — GLUCOSE, CAPILLARY
Glucose-Capillary: 86 mg/dL (ref 70–99)
Glucose-Capillary: 99 mg/dL (ref 70–99)

## 2021-10-29 LAB — MAGNESIUM: Magnesium: 1.9 mg/dL (ref 1.7–2.4)

## 2021-10-29 LAB — BASIC METABOLIC PANEL
Anion gap: 5 (ref 5–15)
BUN: 24 mg/dL — ABNORMAL HIGH (ref 8–23)
CO2: 28 mmol/L (ref 22–32)
Calcium: 7.5 mg/dL — ABNORMAL LOW (ref 8.9–10.3)
Chloride: 105 mmol/L (ref 98–111)
Creatinine, Ser: 1.04 mg/dL — ABNORMAL HIGH (ref 0.44–1.00)
GFR, Estimated: 53 mL/min — ABNORMAL LOW (ref >60)
Glucose, Bld: 128 mg/dL — ABNORMAL HIGH (ref 70–99)
Potassium: 3.5 mmol/L (ref 3.5–5.1)
Sodium: 138 mmol/L (ref 135–145)

## 2021-10-29 NOTE — Progress Notes (Signed)
PROGRESS NOTE    [name]  PRX:[id number] DOB: 12-15-1934 DOA: 10/23/2021 PCP: [name], MD    Chief Complaint  Patient presents with   Fall    Brief Narrative:  87 year old woman who presented to [facility] ED 9/6 post-mechanical fall, also with decreased PO intake/weakness x 5 days  pt reporting hitting  posterior head while getting out of the bathroom, no loss of conscious ness. CT Head/Cspine NAICA, no fracture. CXR with large L pleural effusion. CTA Chest negative for PE, large left pleural effusion noted with R mediastinal shift, occluded LLL bronchus, concern for poorly defined LLL mass. PCCM consulted for chest tube placement/new LLL mass. Patient became hypotensive requiring Neosynephrine initiation and was admitted to ICU. She is weaned off vasopressors. She currently has chest tube placed on 10/23/21.   Assessment & Plan:   Principal Problem:   Pleural effusion on left Active Problems:   Lung mass   Paroxysmal atrial fibrillation with RVR (HCC)   Pituitary adenoma (HCC)   Hyperlipidemia   Essential hypertension   Obstructive sleep apnea   Type 2 diabetes mellitus with neurological complications (HCC)   COPD (chronic obstructive pulmonary disease) (HCC)   Aortic atherosclerosis and peripheral vascular disease   Hypotension   Acute respiratory failure with hypoxia (HCC)   Hypomagnesemia   Shock circulatory (HCC)   Severe aortic stenosis   Shock ([place])   Acute respiratory failure with hypoxia secondary to a large left pleural effusion.  S/p evacuation with chest tube placement on 10/23/2021, s/p fibrinolytic on 10/24/2021, PCCM repeated the dose of fibrinolysis on 10/29/21. Continue with [place] oxygen to keep sats greater than 90%.  Cytology non diagnostic.  Chest tube to remain for now. Repeat CXR this am shows Increasing pleural fluid along the lateral and apical chest. Watch for chest tube drainage.  Continue with CPAP if she is agreeable.     New left  lower lobe mass 6 cm and pleural based:  IR consulted for a biopsy, recommended repeat CT in 6 weeks before biopsy is attempted.  Continue with empiric antibiotics for a total of 7 days.    Atrial fibrillation with RVR, severe aortic stenosis Rate much better today.  Continue with amiodarone dosing as per cardiology.  Holding anti coagulation due to use of fibrinolytics.  Restart eliquis when okay from pulm stand point.     Hypertension;  BP parameters are optimal.   Circulatory shock secondary to colitis Appears to have resolved.  Continue the course of flagyl for colitis.  Off pressors.  BP parameters have improved.    AKI;  Resolved with IV fluids creatinine back to baseline.     Anemia of critical illness:  Transfuse to keep hemoglobin greater than 8.  Hemoglobin is stable around 10.     Lactic acidosis:  From SIRS.   H/o mechanical fall sec to weakness  Imaging is negative.  Therapy evaluations.   Hypomagnesemia and hypophosphatemia.  Replaced. Repeat levels wnl.    DVT prophylaxis: scd's Code Status: DNR Family Communication: none at bedside.  Disposition:   Status is: Inpatient Remains inpatient appropriate because: chest tube placement.    Level of care: ICU Consultants:  PCCM CARDIOLOGY.   Procedures:  Chest tube placement.   Antimicrobials:  Antibiotics Given (last 72 hours)     Date/Time Action Medication Dose Rate   10/26/21 1653 New Bag/Given   metroNIDAZOLE (FLAGYL) IVPB 500 mg 500 mg 100 mL/hr   10/27/21 0026 New Bag/Given   ceFEPIme (  MAXIPIME) 2 g in sodium chloride 0.9 % 100 mL IVPB 2 g 200 mL/hr   10/27/21 1300 New Bag/Given   ceFEPIme (MAXIPIME) 2 g in sodium chloride 0.9 % 100 mL IVPB 2 g 200 mL/hr   10/27/21 1600 New Bag/Given   metroNIDAZOLE (FLAGYL) IVPB 500 mg 500 mg 100 mL/hr   10/28/21 0015 New Bag/Given   ceFEPIme (MAXIPIME) 2 g in sodium chloride 0.9 % 100 mL IVPB 2 g 200 mL/hr   10/28/21 0434 New Bag/Given    metroNIDAZOLE (FLAGYL) IVPB 500 mg 500 mg 100 mL/hr   10/28/21 1228 New Bag/Given   ceFEPIme (MAXIPIME) 2 g in sodium chloride 0.9 % 100 mL IVPB 2 g 200 mL/hr   10/28/21 1735 New Bag/Given   metroNIDAZOLE (FLAGYL) IVPB 500 mg 500 mg 100 mL/hr   10/29/21 0015 New Bag/Given   ceFEPIme (MAXIPIME) 2 g in sodium chloride 0.9 % 100 mL IVPB 2 g 200 mL/hr   10/29/21 0428 New Bag/Given   metroNIDAZOLE (FLAGYL) IVPB 500 mg 500 mg 100 mL/hr   10/29/21 1247 New Bag/Given   ceFEPIme (MAXIPIME) 2 g in sodium chloride 0.9 % 100 mL IVPB 2 g 200 mL/hr       Subjective: Reports not having a good night sleep.  Sat in the chair for 2 hours, was exhausted.  Breathing is the same, no chest pain.   Objective: Vitals:   10/29/21 1100 10/29/21 1200 10/29/21 1248 10/29/21 1300  BP: (!) 146/59 (!) 156/73  (!) 119/44  Pulse: 88 88  88  Resp: (!) 22 (!) 26  19  Temp:   98.6 F (37 C)   TempSrc:   Oral   SpO2: (!) 89% (!) 87%  (!) 89%  Weight:      Height:        Intake/Output Summary (Last 24 hours) at 10/29/2021 1329 Last data filed at 10/29/2021 1053 Gross per 24 hour  Intake 498.27 ml  Output 1410 ml  Net -911.73 ml    Filed Weights   10/27/21 0443 10/28/21 0500 10/29/21 0400  Weight: 60 kg 60 kg 63.5 kg    Examination:  General exam: elderly woman , not in distress.  Respiratory system: diminished air entry at bases,on [place] oxygen  Cardiovascular system: S1 & S2 heard, RRR. No JVD, No pedal edema. Gastrointestinal system: Abdomen is nondistended, soft and nontender. . Normal bowel sounds heard. Central nervous system: Alert and oriented. No focal neurological deficits. Extremities: Symmetric 5 x 5 power. Skin: No rashes,  Psychiatry:  Mood & affect appropriate.      Data Reviewed: I have personally reviewed following labs and imaging studies  CBC: Recent Labs  Lab 10/23/21 1545 10/24/21 0008 10/25/21 1048 10/27/21 0433 10/27/21 1021 10/28/21 0440 10/29/21 0443  WBC 10.3    < > 12.8* 13.3* 13.0* 11.3* 11.2*  NEUTROABS 8.3*  --   --   --   --   --   --   HGB 13.6   < > 13.5 11.3* 11.1* 10.9* 10.7*  HCT 43.4   < > 42.8 35.5* 34.9* 34.8* 34.6*  MCV 89.9   < > 89.2 88.5 89.5 88.8 90.1  PLT 572*   < > 626* 472* 485* 461* 430*   < > = values in this interval not displayed.     Basic Metabolic Panel: Recent Labs  Lab 10/24/21 0008 10/25/21 1048 10/26/21 0422 10/27/21 0433 10/27/21 1021 10/28/21 0440 10/29/21 0443 10/29/21 0525  NA 138   < >  132* 133* 137 141 138  --   K 3.9   < > 3.8 4.0 3.8 3.6 3.5  --   CL 104   < > 99 101 105 109 105  --   CO2 20*   < > '24 25 24 27 28  '$ --   GLUCOSE 105*   < > 167* 127* 144* 120* 128*  --   BUN 19   < > 29* 34* 35* 30* 24*  --   CREATININE 0.89   < > 1.04* 1.01* 1.17* 1.12* 1.04*  --   CALCIUM 8.8*   < > 8.4* 7.8* 7.9* 8.0* 7.5*  --   MG 1.6*  --  2.0  --   --  1.7  --  1.9  PHOS 3.5  --   --   --   --  2.2*  --  3.4   < > = values in this interval not displayed.     GFR: Estimated Creatinine Clearance: 35.5 mL/min (A) (by C-G formula based on SCr of 1.04 mg/dL (H)).  Liver Function Tests: Recent Labs  Lab 10/23/21 1545 10/25/21 1048 10/26/21 0422 10/27/21 1021 10/28/21 0440  AST 16 12* 12* 18 31  ALT '9 8 7 9 15  '$ ALKPHOS 115 92 112 101 115  BILITOT 1.3* 0.6 0.7 0.4 0.5  PROT 7.0  7.1 5.3* 5.3* 4.8* 5.1*  ALBUMIN 3.2* 2.2* 2.3* 2.0* 2.0*     CBG: Recent Labs  Lab 10/27/21 0814 10/27/21 1634 10/28/21 0757 10/28/21 1646 10/29/21 0754  GLUCAP 120* 139* 91 120* 86      Recent Results (from the past 240 hour(s))  Resp Panel by RT-PCR (Flu A&B, Covid) Anterior Nasal Swab     Status: None   Collection Time: 10/23/21  4:21 PM   Specimen: Anterior Nasal Swab  Result Value Ref Range Status   SARS Coronavirus 2 by RT PCR NEGATIVE NEGATIVE Final    Comment: (NOTE) SARS-CoV-2 target nucleic acids are NOT DETECTED.  The SARS-CoV-2 RNA is generally detectable in upper respiratory specimens  during the acute phase of infection. The lowest concentration of SARS-CoV-2 viral copies this assay can detect is 138 copies/mL. A negative result does not preclude SARS-Cov-2 infection and should not be used as the sole basis for treatment or other patient management decisions. A negative result may occur with  improper specimen collection/handling, submission of specimen other than nasopharyngeal swab, presence of viral mutation(s) within the areas targeted by this assay, and inadequate number of viral copies(<138 copies/mL). A negative result must be combined with clinical observations, patient history, and epidemiological information. The expected result is Negative.  Fact Sheet for Patients:  EntrepreneurPulse.com.au  Fact Sheet for Healthcare Providers:  IncredibleEmployment.be  This test is no t yet approved or cleared by the [place] FDA and  has been authorized for detection and/or diagnosis of SARS-CoV-2 by FDA under an Emergency Use Authorization (EUA). This EUA will remain  in effect (meaning this test can be used) for the duration of the COVID-19 declaration under Section 564(b)(1) of the Act, 21 U.S.C.section 360bbb-3(b)(1), unless the authorization is terminated  or revoked sooner.       Influenza A by PCR NEGATIVE NEGATIVE Final   Influenza B by PCR NEGATIVE NEGATIVE Final    Comment: (NOTE) The Xpert Xpress SARS-CoV-2/FLU/RSV plus assay is intended as an aid in the diagnosis of influenza from Nasopharyngeal swab specimens and should not be used as a sole basis for treatment. Nasal washings  and aspirates are unacceptable for Xpert Xpress SARS-CoV-2/FLU/RSV testing.  Fact Sheet for Patients: EntrepreneurPulse.com.au  Fact Sheet for Healthcare Providers: IncredibleEmployment.be  This test is not yet approved or cleared by the [place] FDA and has been authorized for detection  and/or diagnosis of SARS-CoV-2 by FDA under an Emergency Use Authorization (EUA). This EUA will remain in effect (meaning this test can be used) for the duration of the COVID-19 declaration under Section 564(b)(1) of the Act, 21 U.S.C. section 360bbb-3(b)(1), unless the authorization is terminated or revoked.  Performed at [facility], [place] [address], [place], [place] [address]   Body fluid culture w Gram Stain     Status: None   Collection Time: 10/23/21  7:19 PM   Specimen: Pleura; Body Fluid  Result Value Ref Range Status   Specimen Description   Final    PLEURAL Performed at [facility] [address] [place], [place] [address]    Special Requests   Final    NONE Performed at [facility], [place] [address] [place], [place] [address]    Gram Stain   Final    NO ORGANISMS SEEN RARE WBC PRESENT, PREDOMINANTLY PMN    Culture   Final    NO GROWTH 3 DAYS Performed at [facility] [address], [place], [place] [address]    Report Status 10/27/2021 FINAL  Final  MRSA Next Gen by PCR, Nasal     Status: None   Collection Time: 10/23/21  9:53 PM   Specimen: Nasal Mucosa; Nasal Swab  Result Value Ref Range Status   MRSA by PCR Next Gen NOT DETECTED NOT DETECTED Final    Comment: (NOTE) The GeneXpert MRSA Assay (FDA approved for NASAL specimens only), is one component of a comprehensive MRSA colonization surveillance program. It is not intended to diagnose MRSA infection nor to guide or monitor treatment for MRSA infections. Test performance is not FDA approved in patients less than 51 years old. Performed at [facility], [place] [address] [place], [place] [address]          Radiology Studies: DG CHEST PORT 1 VIEW  Result Date: 10/29/2021 CLINICAL DATA:  Pleural effusion in a female at age 74. EXAM: PORTABLE CHEST 1 VIEW COMPARISON:  October 27, 2021. FINDINGS: EKG leads project over  the chest. LEFT-sided chest tube remains in place, small bore tube. Since the previous exam there is increased pleural fluid in the LEFT chest along the lateral chest and extending to the apex. This shows a loculated appearance and while of smaller volume than the study of October 23, 2021 is increased since the most recent exam. Cardiomediastinal contours and hilar structures grossly stable though largely obscured in the LEFT chest due to increase in pleural fluid. Graded opacity also suggested at the RIGHT lung base. No visible pneumothorax. On limited assessment no acute skeletal process with RIGHT-sided PICC line in place terminating at the caval to atrial junction. IMPRESSION: 1. Increasing pleural fluid along the lateral and apical chest showing slow increase over a series of recent comparison studies. 2. Chest tube remaining in place, similar position. 3. Likely small effusion at the RIGHT lung base. 4. No visible pneumothorax. Electronically Signed   By: Zetta Bills M.D.   On: 10/29/2021 08:21        Scheduled Meds:  amiodarone  200 mg Oral BID   atorvastatin  80 mg Oral QHS   Chlorhexidine Gluconate Cloth  6  each Topical Daily   enoxaparin (LOVENOX) injection  40 mg Subcutaneous Q24H   metoprolol tartrate  12.5 mg Oral BID   phosphorus  500 mg Oral BID   sodium chloride flush  10 mL Intrapleural Q8H   sodium chloride flush  10-40 mL Intracatheter Q12H   Continuous Infusions:  sodium chloride Stopped (10/28/21 1602)   ceFEPime (MAXIPIME) IV 2 g (10/29/21 1247)   metronidazole Stopped (10/29/21 0528)     LOS: 6 days        Hosie Poisson, MD [facility]   To contact the attending provider between 7A-7P or the covering provider during after hours 7P-7A, please log into the web site [email or web address] and access using universal [place] password for that web site. If you do not have the password, please call the hospital operator.  10/29/2021, 1:29 PM

## 2021-10-29 NOTE — Progress Notes (Signed)
Pharmacy Antibiotic Note  [name] is a 87 y.o. female admitted on 10/23/2021 s/p mechanical fall.  CT showed new LLL mass and large L pleural effusion with R mediastinal shift, chest tube was placed and intrapleural lytics administered x 1 on 9/7. Pharmacy has been consulted for Cefepime dosing for possible PNA.  Plan: Continue Cefepime 2g IV q12h Follow up renal function & cultures  Height: '5\' 3"'$  (160 cm) Weight: 63.5 kg (139 lb 15.9 oz) IBW/kg (Calculated) : 52.4  Temp (24hrs), Avg:97.9 F (36.6 C), Min:97.6 F (36.4 C), Max:98 F (36.7 C)  Recent Labs  Lab 10/23/21 1546 10/24/21 0008 10/25/21 1048 10/25/21 1350 10/25/21 1725 10/26/21 0422 10/27/21 0433 10/27/21 1021 10/28/21 0440 10/29/21 0443  WBC  --    < > 12.8*  --   --   --  13.3* 13.0* 11.3* 11.2*  CREATININE  --    < > 0.99  --   --  1.04* 1.01* 1.17* 1.12* 1.04*  LATICACIDVEN 1.6  --  2.8* 1.7 1.6  --   --  2.4*  --   --    < > = values in this interval not displayed.     Estimated Creatinine Clearance: 35.5 mL/min (A) (by C-G formula based on SCr of 1.04 mg/dL (H)).    Allergies  Allergen Reactions   Valacyclovir Hcl Rash    Antimicrobials this admission: 9/8 Flagyl >> 9/9 Cefepime >>  Dose adjustments this admission:  Microbiology results: 9/6 Pleural fluid: ngf 9/6 MRSA PCR: not detected  Thank you for allowing pharmacy to be a part of this patient's care.  Gretta Arab PharmD, BCPS Clinical Pharmacist [facility] main pharmacy [phone] 10/29/2021 9:24 AM

## 2021-10-29 NOTE — Progress Notes (Signed)
Rounding Note    Patient Name: [name] Date of Encounter: 10/29/2021  [facility] Cardiologist: Candee Furbish, MD \  Subjective  No other further atrial fibrillation on p.o. amio  Inpatient Medications    Scheduled Meds:  amiodarone  200 mg Oral BID   atorvastatin  80 mg Oral QHS   Chlorhexidine Gluconate Cloth  6 each Topical Daily   enoxaparin (LOVENOX) injection  40 mg Subcutaneous Q24H   metoprolol tartrate  12.5 mg Oral BID   phosphorus  500 mg Oral BID   sodium chloride flush  10 mL Intrapleural Q8H   sodium chloride flush  10-40 mL Intracatheter Q12H   Continuous Infusions:  sodium chloride Stopped (10/28/21 1602)   ceFEPime (MAXIPIME) IV 2 g (10/29/21 1247)   metronidazole Stopped (10/29/21 0528)   PRN Meds: acetaminophen, calcium carbonate, docusate sodium, docusate sodium, iohexol, lip balm, mouth rinse, polyethylene glycol, sodium chloride flush   Vital Signs    Vitals:   10/29/21 1051 10/29/21 1100 10/29/21 1200 10/29/21 1248  BP: (!) 137/58 (!) 146/59 (!) 156/73   Pulse: 89 88 88   Resp:  (!) 22 (!) 26   Temp:    98.6 F (37 C)  TempSrc:    Oral  SpO2:  (!) 89% (!) 87%   Weight:      Height:        Intake/Output Summary (Last 24 hours) at 10/29/2021 1307 Last data filed at 10/29/2021 1053 Gross per 24 hour  Intake 498.27 ml  Output 1410 ml  Net -911.73 ml       10/29/2021    4:00 AM 10/28/2021    5:00 AM 10/27/2021    4:43 AM  Last 3 Weights  Weight (lbs) 139 lb 15.9 oz 132 lb 4.4 oz 132 lb 4.4 oz  Weight (kg) 63.5 kg 60 kg 60 kg      Telemetry    Normal sinus rhythm- Personally Reviewed  ECG    No new tracings - Personally Reviewed  Physical Exam  Thin frail appearing HEENT: Normal NECK: No JVD; No carotid bruits LYMPHATICS: No lymphadenopathy CARDIAC:RRR, no  rubs, gallops 2/6 late peaking systolic murmur the right upper sternal border rating to left lower sternal border RESPIRATORY:  Clear to  auscultation without rales, wheezing or rhonchi  ABDOMEN: Soft, non-tender, non-distended MUSCULOSKELETAL:  No edema; No deformity  SKIN: Warm and dry NEUROLOGIC:  Alert and oriented x 3 PSYCHIATRIC:  Normal affect   Labs    High Sensitivity Troponin:   Recent Labs  Lab 10/23/21 1545 10/25/21 1048 10/25/21 1248 10/26/21 0422 10/27/21 1021  TROPONINIHS 9 20* 19* 12 9      Chemistry Recent Labs  Lab 10/26/21 0422 10/27/21 0433 10/27/21 1021 10/28/21 0440 10/29/21 0443 10/29/21 0525  NA 132*   < > 137 141 138  --   K 3.8   < > 3.8 3.6 3.5  --   CL 99   < > 105 109 105  --   CO2 24   < > '24 27 28  '$ --   GLUCOSE 167*   < > 144* 120* 128*  --   BUN 29*   < > 35* 30* 24*  --   CREATININE 1.04*   < > 1.17* 1.12* 1.04*  --   CALCIUM 8.4*   < > 7.9* 8.0* 7.5*  --   MG 2.0  --   --  1.7  --  1.9  PROT 5.3*  --  4.8* 5.1*  --   --   ALBUMIN 2.3*  --  2.0* 2.0*  --   --   AST 12*  --  18 31  --   --   ALT 7  --  9 15  --   --   ALKPHOS 112  --  101 115  --   --   BILITOT 0.7  --  0.4 0.5  --   --   GFRNONAA 53*   < > 46* 48* 53*  --   ANIONGAP 9   < > '8 5 5  '$ --    < > = values in this interval not displayed.     Lipids No results for input(s): "CHOL", "TRIG", "HDL", "LABVLDL", "LDLCALC", "CHOLHDL" in the last 168 hours.  Hematology Recent Labs  Lab 10/27/21 1021 10/28/21 0440 10/29/21 0443  WBC 13.0* 11.3* 11.2*  RBC 3.90 3.92 3.84*  HGB 11.1* 10.9* 10.7*  HCT 34.9* 34.8* 34.6*  MCV 89.5 88.8 90.1  MCH 28.5 27.8 27.9  MCHC 31.8 31.3 30.9  RDW 16.1* 16.1* 16.2*  PLT 485* 461* 430*    Thyroid  Recent Labs  Lab 10/24/21 1037 10/26/21 0422  TSH 0.034*  --   FREET4  --  1.49*     BNP Recent Labs  Lab 10/25/21 1048  BNP 510.4*     DDimer No results for input(s): "DDIMER" in the last 168 hours.   Radiology    DG CHEST PORT 1 VIEW  Result Date: 10/29/2021 CLINICAL DATA:  Pleural effusion in a female at age 87. EXAM: PORTABLE CHEST 1 VIEW COMPARISON:   October 27, 2021. FINDINGS: EKG leads project over the chest. LEFT-sided chest tube remains in place, small bore tube. Since the previous exam there is increased pleural fluid in the LEFT chest along the lateral chest and extending to the apex. This shows a loculated appearance and while of smaller volume than the study of October 23, 2021 is increased since the most recent exam. Cardiomediastinal contours and hilar structures grossly stable though largely obscured in the LEFT chest due to increase in pleural fluid. Graded opacity also suggested at the RIGHT lung base. No visible pneumothorax. On limited assessment no acute skeletal process with RIGHT-sided PICC line in place terminating at the caval to atrial junction. IMPRESSION: 1. Increasing pleural fluid along the lateral and apical chest showing slow increase over a series of recent comparison studies. 2. Chest tube remaining in place, similar position. 3. Likely small effusion at the RIGHT lung base. 4. No visible pneumothorax. Electronically Signed   By: Zetta Bills M.D.   On: 10/29/2021 08:21    Cardiac Studies   Echo 10/25/21: 1. Calcified aortic valve with concern for low flow, low gradient severe  AS (mean gradient 22 mmHg, AVA 0.7 cm2 and DI 0.24).   2. Left ventricular ejection fraction, by estimation, is >75%. The left  ventricle has hyperdynamic function. The left ventricle has no regional  wall motion abnormalities. There is moderate concentric left ventricular  hypertrophy. Left ventricular  diastolic parameters are consistent with Grade I diastolic dysfunction  (impaired relaxation).   3. Right ventricular systolic function is normal. The right ventricular  size is normal. There is mildly elevated pulmonary artery systolic  pressure.   4. The mitral valve is normal in structure. Trivial mitral valve  regurgitation. No evidence of mitral stenosis.   5. The aortic valve is calcified. Aortic valve regurgitation is trivial.   Severe aortic valve  stenosis.   6. The inferior vena cava is normal in size with greater than 50%  respiratory variability, suggesting right atrial pressure of 3 mmHg.    ECHO: 2018 - Left ventricle: The cavity size was normal. Systolic function was    vigorous. The estimated ejection fraction was in the range of 65%    to 70%. Wall motion was normal; there were no regional wall    motion abnormalities. Doppler parameters are consistent with    abnormal left ventricular relaxation (grade 1 diastolic    dysfunction). Doppler parameters are consistent with    indeterminate ventricular filling pressure.  - Aortic valve: Valve mobility was restricted. There was moderate    stenosis. There was no regurgitation. Peak velocity (S): 381    cm/s. Mean gradient (S): 35 mm Hg.  - Mitral valve: Calcified annulus. Transvalvular velocity was    within the normal range. There was no evidence for stenosis.    There was trivial regurgitation.  - Right ventricle: The cavity size was normal. Wall thickness was    normal. Systolic function was normal.  - Atrial septum: No defect or patent foramen ovale was identified    by color flow Doppler.  - Tricuspid valve: There was mild regurgitation.  - Pulmonary arteries: Systolic pressure was mildly increased. PA    peak pressure: 41 mm Hg (S).    R/L HEART CATH: 06/2011 Hemodynamics:                                     RA 2                                     RV 32/3                                     PA 34/11  mean 22                                     PCWP Mean 11                                     LV 161/18                                     AO 146/96                                     AV area  1.74                                     AV gradient (mean)  6.72             Oxygen saturations:  PA 64                                     AO 95               Cardiac Output (Fick) 4.2                                Cardiac Index (Fick) 2.2                Final Conclusions:  Normal coronaries.  NL LF function.  Only mild AS by this measurement.  Echo suggests severe.  Pulmonary pressures and EDP are not elevated.   Recommendations:  Based on this the aortic valve does not appear to be the etiology for her dypsnea.  No further cardiac work up.    Patient Profile     87 y.o. female with atrial fibrillation paroxysmal, left pleural effusion exudative recent mechanical fall secondary to weakness dehydration, left lower lobe mass. Cardiology was consulted for Afib RVR.  Assessment & Plan    Atrial fibrillation with RVR -Started on amiodarone 200 mg twice daily and Lopressor 12.5 mg twice daily with no further episodes of atrial fibrillation  -Currently not on anticoagulation due to recent thrombolytics through chest tube -Her CHA2DS2-VASc score is high at 6 making her high risk for cardioembolic events.  She does have a history of falls so would recommend evaluation by physical therapy to determine her ongoing risk of falls and if high would recommend not to anticoagulate.  Aortic stenosis Low gradient low flow severe AS Avoid tachycardia / hypotension  Hypotension Hypotension has resolved Blood are stable on beta-blocker low-dose  Pleural effusion Colitis Per primary team Tolerating solid food in room    [place] will sign off.   Medication Recommendations: Amiodarone 200 mg twice daily, atorvastatin 80 mg daily, Lopressor 12.5 mg twice daily.  Restart Eliquis 5 mg twice daily when safe from pulmonary standpoint Other recommendations (labs, testing, etc): None Follow up as an outpatient: Dr. Marlou Porch in 1 to 2 weeks  For questions or updates, please contact [place] Please consult [email or web address] for contact info under        Signed, Fransico Him, MD  10/29/2021, 1:07 PM

## 2021-10-29 NOTE — Progress Notes (Signed)
NAME:  [name], MRN:  [id number], DOB:  02-14-1935, LOS: 6 ADMISSION DATE:  10/23/2021 CONSULTATION DATE:  10/23/2021 REFERRING MD:  [place] - EDP CHIEF COMPLAINT:  Hypotension in the setting of Afib with RVR   BRIEF  87 year old woman who presented to [facility] ED 9/6 post-mechanical fall, also with decreased PO intake/weakness x 5 days. Hit posterior head while getting out of the bathroom; no LOC. Not on AC. PMHx significant for HTN, HLD, COPD/OSA, T2DM, fibromuscular dysplasia, pseudogout.  Per chart review, patient had been feeling very weak and sustained a mechanical fall when ambulating back from the restroom. She struck her posterior head on the floor but did not have LOC. Denies recent fever/chills, CP/SOB, n/v/d or abdominal pain. Endorses HA s/p fall. EMS noted mild hypotension in the field, initially corrected after fluid administration.  On ED evaluation, afebrile with HR 100s, mildly tachypneic to 25, BP 129/62, SpO2 91% on RA. Labs notable for WBC/H&H WNL, Plt 572, lytes WNL, Cr 1.03 (baseline 0.8), normal AST/ALT, Tbili 1.3. Serum LDH 213, trop WNL. LA 1.6. COVID/Flu negative. CT Head/Cspine NAICA, no fracture. CXR with large L pleural effusion. CTA Chest negative for PE, large left pleural effusion noted with R mediastinal shift, occluded LLL bronchus, concern for poorly defined LLL mass.   PCCM consulted for chest tube placement/new LLL mass. Patient became hypotensive requiring Neosynephrine initiation and was admitted to ICU.  Pertinent Medical History:   has a past medical history of Allergic rhinitis, Arthritis, Complication of anesthesia, COPD (chronic obstructive pulmonary disease) ([place]), Diabetes mellitus, Fibromuscular dysplasia ([place]), Hyperlipidemia, Hypertension, PONV (postoperative nausea and vomiting), Pseudogout, and Sleep apnea.   has a past surgical history that includes pituitary tumor removed; Back surgery (2005); Tonsillectomy; nasal septal deviation; bilateral  renal bypass; and Esophagogastroduodenoscopy (N/A, 10/01/2012).   Significant Hospital Events: Including procedures, antibiotic start and stop dates in addition to other pertinent events   9/6 - Presented to [facility] for mechanical fall with weakness, poor PO intake. CT Chest negative for PE, c/f new LLL mass and large L pleural effusion with R mediastinal shift. CT Head/Cspine negative. Chest tube placed.  9/7 intrapleural lytics administered dose 1 -> 750cc  out. -> CT abd lung cut 9/8 with very small residual effusion only  9/8 - circulatory shock with colitis. RUE PICC Placed. Started flagyl IV  10/25/21: approx 222h ago -> suddent shock with vomitting and abd pain. CT with Descending COLITIS. -> Flagyl started. On levophed and hydrocor. ECHO with severe AS and EF > 70% and hyperdymanic -> gioven fluids -> this AM  feels better. Looks better. Hungry and wants to eat. cPAP at her side - says she used 15 years ago for 1-2 years and tried last night but did not tolerate. Per RN - not making urine. -> CREAT UP 1.'04mg'$ % AMio gtt off x 22h. HR 88 - > monitor says sinus. On levophed 72mg -> MAP 70 with low diast. NSR 9/11  Overall improved, off pressors, episode of Afib overnight now improved, no pain, no complaints, still has chest tube in place with minimal output, tolerating diet 9/12 Repeat dose fibrinolytics yesterday, 100cc chest tube output, respiratory status stable    Interim History / Subjective:   Pt feels fatigued, but no worsening dyspnea Stable overnight 100cc CT output after intrapleural lytics    Objective:  Blood pressure (!) 140/52, pulse 80, temperature 97.9 F (36.6 C), temperature source Axillary, resp. rate 16, height '5\' 3"'$  (1.6 m), weight 63.5 kg,  SpO2 (!) 89 %.        Intake/Output Summary (Last 24 hours) at 10/29/2021 0840 Last data filed at 10/29/2021 0800 Gross per 24 hour  Intake 1020.9 ml  Output 1305 ml  Net -284.1 ml    Filed Weights   10/27/21 0443 10/28/21  0500 10/29/21 0400  Weight: 60 kg 60 kg 63.5 kg       General:  thin, elderly F, uncomfortable-appearing, resting in bed with no complaints HEENT: MM pink/moist, sclera anicteric Neuro: awake, arousable and oriented, following commands CV: s1s2 rrr, no r/g, holosystolic murmur PULM:  scattered rhonchi bilateral bases, L chest tube in place with 100cc dark serosanguinous drainage in [place] over last shift, [place] 3L without distress  GI: soft, non-tender Extremities: warm/dry, trace pedal edema  Skin: no rashes or lesions   Labs reviewed Phos 3.4. Mag 1.9 Creatinine 1.04 WBC 11    CXR 9/12 Personally reviewed Slightly increased pleural fluid  Resolved Problems Shock AKI    Assessment & Plan:    Acute Hypoxic Respiratory Failure secondary to L pleural effusion History OSA Large left pleural effusion s/p chest tube placement 9/6 with 2L output and s/p fibrinolytic 10/24/21 with anotuer 750cc. Cytology 9/6 - non diagnostic. LDH 500 ->  Plan -no significant chest tube output after 2nd dose intrapleural lytics  -stable on 3L [place], CXR with slightly increasing effusion -will give Lasix '40mg'$ , consider 3rd dose lytics - chest tube to remain for now with suciton -CPAP ordered if tolerates, however does not use at home     New LLL mass 6cm pleural based   CTA Chest 9/6 with large L pleural effusion noted with R mediastinal shift, occluded LLL bronchus, concern for poorly defined LLL mass. -> 6cm pleural based mass on CT 10/25/21. IR consult 10/26/21 Plan  - Per IR : short term followup with repeat CT chest in 6 weeks before bx attempted - empiric cefepime 10/27/21 x  total 7 days  - await urine leg, urine strep negative - needs Pulmonary Followup -> message sent to front desk at Pulmonary clinic   Afib with RVR Severe aortic stenosis HFpEF -continue po amiodarone and consider low dose BB per cardiology  cards recommending anticoagualtion when chest tube fibrinolytics not an  issue  Plan  - management per cards  - continue lipitor   Descending Colitis   Initially had abdominal pain and vomiting now improved Plan - regular diet - monitor - Flagyl since 10/25/21 - course to complete total 8-9 days  Anemia of critical ilness - onset 10/27/21, mild -monitor CBC and transfuse as needed    Mechanical fall secondary to weakness and severe fraility-  CT Head/Cspine negative for AICA/fracture. Plan - PT recommending short term SNF.    Best practice:  Diet: regular 10/26/21  Pain/Anxiety/Delirium protocol (if indicated): x  VAP protocol (if indicated): x  DVT prophylaxis: SQ lovenox. Will need fulll AC asap due to a fib -> ? After chest tube comes out. Needs cards opinion  GI prophylaxis: x  Glucose control: ssi  Mobility: bed rest  Code Status: DNR but full medical care  Family Communication:  -per primary  Disposition: per primary      LABS    PULMONARY Recent Labs  Lab 10/25/21 1033  PHART 7.5*  PCO2ART 27*  PO2ART 93  HCO3 21.1  O2SAT 99.5     CBC Recent Labs  Lab 10/27/21 1021 10/28/21 0440 10/29/21 0443  HGB 11.1* 10.9* 10.7*  HCT 34.9* 34.8*  34.6*  WBC 13.0* 11.3* 11.2*  PLT 485* 461* 430*     COAGULATION Recent Labs  Lab 10/25/21 1048  INR 1.5*     CARDIAC  No results for input(s): "TROPONINI" in the last 168 hours. No results for input(s): "PROBNP" in the last 168 hours.   CHEMISTRY Recent Labs  Lab 10/24/21 0008 10/25/21 1048 10/26/21 0422 10/27/21 0433 10/27/21 1021 10/28/21 0440 10/29/21 0443 10/29/21 0525  NA 138   < > 132* 133* 137 141 138  --   K 3.9   < > 3.8 4.0 3.8 3.6 3.5  --   CL 104   < > 99 101 105 109 105  --   CO2 20*   < > '24 25 24 27 28  '$ --   GLUCOSE 105*   < > 167* 127* 144* 120* 128*  --   BUN 19   < > 29* 34* 35* 30* 24*  --   CREATININE 0.89   < > 1.04* 1.01* 1.17* 1.12* 1.04*  --   CALCIUM 8.8*   < > 8.4* 7.8* 7.9* 8.0* 7.5*  --   MG 1.6*  --  2.0  --   --  1.7   --  1.9  PHOS 3.5  --   --   --   --  2.2*  --  3.4   < > = values in this interval not displayed.    Estimated Creatinine Clearance: 35.5 mL/min (A) (by C-G formula based on SCr of 1.04 mg/dL (H)).   LIVER Recent Labs  Lab 10/23/21 1545 10/25/21 1048 10/26/21 0422 10/27/21 1021 10/28/21 0440  AST 16 12* 12* 18 31  ALT '9 8 7 9 15  '$ ALKPHOS 115 92 112 101 115  BILITOT 1.3* 0.6 0.7 0.4 0.5  PROT 7.0  7.1 5.3* 5.3* 4.8* 5.1*  ALBUMIN 3.2* 2.2* 2.3* 2.0* 2.0*  INR  --  1.5*  --   --   --       INFECTIOUS Recent Labs  Lab 10/25/21 1350 10/25/21 1725 10/26/21 0422 10/27/21 0433 10/27/21 1021  LATICACIDVEN 1.7 1.6  --   --  2.4*  PROCALCITON  --   --  1.14 0.90 0.83      ENDOCRINE CBG (last 3)  Recent Labs    10/28/21 0757 10/28/21 1646 10/29/21 0754  GLUCAP 91 120* 86          IMAGING x48h  - image(s) personally visualized  -   highlighted in bold DG CHEST PORT 1 VIEW  Result Date: 10/29/2021 CLINICAL DATA:  Pleural effusion in a female at age 89. EXAM: PORTABLE CHEST 1 VIEW COMPARISON:  October 27, 2021. FINDINGS: EKG leads project over the chest. LEFT-sided chest tube remains in place, small bore tube. Since the previous exam there is increased pleural fluid in the LEFT chest along the lateral chest and extending to the apex. This shows a loculated appearance and while of smaller volume than the study of October 23, 2021 is increased since the most recent exam. Cardiomediastinal contours and hilar structures grossly stable though largely obscured in the LEFT chest due to increase in pleural fluid. Graded opacity also suggested at the RIGHT lung base. No visible pneumothorax. On limited assessment no acute skeletal process with RIGHT-sided PICC line in place terminating at the caval to atrial junction. IMPRESSION: 1. Increasing pleural fluid along the lateral and apical chest showing slow increase over a series of recent comparison studies. 2. Chest tube  remaining  in place, similar position. 3. Likely small effusion at the RIGHT lung base. 4. No visible pneumothorax. Electronically Signed   By: Zetta Bills M.D.   On: 10/29/2021 08:21   DG CHEST PORT 1 VIEW  Result Date: 10/27/2021 CLINICAL DATA:  Left-sided chest tube. EXAM: PORTABLE CHEST 1 VIEW COMPARISON:  10/25/2021 FINDINGS: Right-sided PICC line unchanged with tip over the SVC. Left basilar chest tube unchanged. No evidence of left-sided pneumothorax. Stable opacification over the left lung base likely small effusion with associated atelectasis. Slight worsening hazy attenuation over the left mid to lower lung which may be due to layering pleural fluid and less likely asymmetric edema/infection. Right lung is clear. Cardiomediastinal silhouette and remainder of the exam is unchanged. IMPRESSION: 1. Stable left base opacification likely small effusion with associated atelectasis. Slight worsening hazy attenuation over the left mid to lower lung which may be due to layering pleural fluid and less likely asymmetric edema/infection. 2. Tubes and lines as described. Electronically Signed   By: Marin Olp M.D.   On: 10/27/2021 10:03      Otilio Carpen Davyon Fisch, PA-C [facility] See Amion for pager If no response to pager , please call [phone] until 7pm After 7:00 pm call Elink  [phone]

## 2021-10-30 ENCOUNTER — Inpatient Hospital Stay (HOSPITAL_COMMUNITY): Payer: Medicare PPO

## 2021-10-30 DIAGNOSIS — I10 Essential (primary) hypertension: Secondary | ICD-10-CM | POA: Diagnosis not present

## 2021-10-30 DIAGNOSIS — I700 Atherosclerosis of aorta: Secondary | ICD-10-CM | POA: Diagnosis not present

## 2021-10-30 DIAGNOSIS — I951 Orthostatic hypotension: Secondary | ICD-10-CM | POA: Diagnosis not present

## 2021-10-30 DIAGNOSIS — J90 Pleural effusion, not elsewhere classified: Secondary | ICD-10-CM | POA: Diagnosis not present

## 2021-10-30 LAB — BASIC METABOLIC PANEL
Anion gap: 5 (ref 5–15)
BUN: 16 mg/dL (ref 8–23)
CO2: 30 mmol/L (ref 22–32)
Calcium: 7.4 mg/dL — ABNORMAL LOW (ref 8.9–10.3)
Chloride: 102 mmol/L (ref 98–111)
Creatinine, Ser: 0.78 mg/dL (ref 0.44–1.00)
GFR, Estimated: >60 mL/min (ref >60)
Glucose, Bld: 95 mg/dL (ref 70–99)
Potassium: 3 mmol/L — ABNORMAL LOW (ref 3.5–5.1)
Sodium: 137 mmol/L (ref 135–145)

## 2021-10-30 LAB — CBC
HCT: 34.6 % — ABNORMAL LOW (ref 36.0–46.0)
Hemoglobin: 11.1 g/dL — ABNORMAL LOW (ref 12.0–15.0)
MCH: 28.2 pg (ref 26.0–34.0)
MCHC: 32.1 g/dL (ref 30.0–36.0)
MCV: 88 fL (ref 80.0–100.0)
Platelets: 374 10*3/uL (ref 150–400)
RBC: 3.93 MIL/uL (ref 3.87–5.11)
RDW: 16 % — ABNORMAL HIGH (ref 11.5–15.5)
WBC: 13.7 10*3/uL — ABNORMAL HIGH (ref 4.0–10.5)
nRBC: 0 % (ref 0.0–0.2)

## 2021-10-30 LAB — GLUCOSE, CAPILLARY: Glucose-Capillary: 84 mg/dL (ref 70–99)

## 2021-10-30 LAB — MAGNESIUM: Magnesium: 1.4 mg/dL — ABNORMAL LOW (ref 1.7–2.4)

## 2021-10-30 MED ORDER — POTASSIUM CHLORIDE 20 MEQ PO PACK
20.0000 meq | PACK | ORAL | Status: AC
  Administered 2021-10-30 (×2): 20 meq via ORAL
  Filled 2021-10-30 (×2): qty 1

## 2021-10-30 MED ORDER — POTASSIUM CHLORIDE CRYS ER 20 MEQ PO TBCR: 20.0000 meq | EXTENDED_RELEASE_TABLET | ORAL | Status: DC

## 2021-10-30 MED ORDER — MAGNESIUM SULFATE 4 GM/100ML IV SOLN
4.0000 g | Freq: Once | INTRAVENOUS | Status: AC
  Administered 2021-10-30: 4 g via INTRAVENOUS
  Filled 2021-10-30: qty 100

## 2021-10-30 MED ORDER — POTASSIUM CHLORIDE 10 MEQ/50ML IV SOLN
10.0000 meq | INTRAVENOUS | Status: AC
  Administered 2021-10-30 (×4): 10 meq via INTRAVENOUS
  Filled 2021-10-30 (×4): qty 50

## 2021-10-30 MED ORDER — MELATONIN 3 MG PO TABS: 3.0000 mg | ORAL_TABLET | Freq: Once | ORAL | Status: DC

## 2021-10-30 NOTE — Progress Notes (Signed)
NAME:  [name], MRN:  [id number], DOB:  Jan 03, 1935, LOS: 7 ADMISSION DATE:  10/23/2021 CONSULTATION DATE:  10/23/2021 REFERRING MD:  Kommor - EDP CHIEF COMPLAINT:  Hypotension in the setting of Afib with RVR   BRIEF  87 year old woman who presented to [facility] ED 9/6 post-mechanical fall, also with decreased PO intake/weakness x 5 days. Hit posterior head while getting out of the bathroom; no LOC. Not on AC. PMHx significant for HTN, HLD, COPD/OSA, T2DM, fibromuscular dysplasia, pseudogout.  Per chart review, patient had been feeling very weak and sustained a mechanical fall when ambulating back from the restroom. She struck her posterior head on the floor but did not have LOC. Denies recent fever/chills, CP/SOB, n/v/d or abdominal pain. Endorses HA s/p fall. EMS noted mild hypotension in the field, initially corrected after fluid administration.  On ED evaluation, afebrile with HR 100s, mildly tachypneic to 25, BP 129/62, SpO2 91% on RA. Labs notable for WBC/H&H WNL, Plt 572, lytes WNL, Cr 1.03 (baseline 0.8), normal AST/ALT, Tbili 1.3. Serum LDH 213, trop WNL. LA 1.6. COVID/Flu negative. CT Head/Cspine NAICA, no fracture. CXR with large L pleural effusion. CTA Chest negative for PE, large left pleural effusion noted with R mediastinal shift, occluded LLL bronchus, concern for poorly defined LLL mass.   PCCM consulted for chest tube placement/new LLL mass. Patient became hypotensive requiring Neosynephrine initiation and was admitted to ICU.  Pertinent Medical History:   has a past medical history of Allergic rhinitis, Arthritis, Complication of anesthesia, COPD (chronic obstructive pulmonary disease) ([place]), Diabetes mellitus, Fibromuscular dysplasia ([place]), Hyperlipidemia, Hypertension, PONV (postoperative nausea and vomiting), Pseudogout, and Sleep apnea.   has a past surgical history that includes pituitary tumor removed; Back surgery (2005); Tonsillectomy; nasal septal deviation; bilateral  renal bypass; and Esophagogastroduodenoscopy (N/A, 10/01/2012).   Significant Hospital Events: Including procedures, antibiotic start and stop dates in addition to other pertinent events   9/6 - Presented to [facility] ED for mechanical fall with weakness, poor PO intake. CT Chest negative for PE, c/f new LLL mass and large L pleural effusion with R mediastinal shift. CT Head/Cspine negative. Chest tube placed.  9/7 intrapleural lytics administered dose 1 -> 750cc  out. -> CT abd lung cut 9/8 with very small residual effusion only  9/8 - circulatory shock with colitis. RUE PICC Placed. Started flagyl IV  10/25/21: approx 222h ago -> suddent shock with vomitting and abd pain. CT with Descending COLITIS. -> Flagyl started. On levophed and hydrocor. ECHO with severe AS and EF > 70% and hyperdymanic -> gioven fluids -> this AM  feels better. Looks better. Hungry and wants to eat. cPAP at her side - says she used 15 years ago for 1-2 years and tried last night but did not tolerate. Per RN - not making urine. -> CREAT UP 1.'04mg'$ % AMio gtt off x 22h. HR 88 - > monitor says sinus. On levophed 34mg -> MAP 70 with low diast. NSR 9/11  Overall improved, off pressors, episode of Afib overnight now improved, no pain, no complaints, still has chest tube in place with minimal output, tolerating diet 9/12 Repeat dose fibrinolytics yesterday, 100cc chest tube output, respiratory status stable  Interim History / Subjective:   Pt feels fatigued, breathing feels about the same Stable overnight Minimal output from chest tube despite lytics  Objective:  Blood pressure (!) 154/63, pulse 88, temperature 97.9 F (36.6 C), temperature source Oral, resp. rate 14, height '5\' 3"'$  (1.6 m), weight 67.2 kg, SpO2 90 %.  Intake/Output Summary (Last 24 hours) at 10/30/2021 0841 Last data filed at 10/30/2021 0600 Gross per 24 hour  Intake 844.67 ml  Output 1475 ml  Net -630.33 ml   Filed Weights   10/28/21 0500 10/29/21 0400  10/30/21 0500  Weight: 60 kg 63.5 kg 67.2 kg    General: Elderly, chronically ill-appearing, does appear comfortable HEENT: Moist oral mucosa Neuro: No neurological focality CV: S1-S2 appreciated, holosystolic murmur PULM: Minimal rhonchi, chest tube in place with minimal output, GI: Soft, nontender Extremities: Trace pedal edema Skin: no rashes or lesions   Labs reviewed Potassium 3.0, magnesium 1.4  CXR 9/12 Personally reviewed Slightly increased pleural fluid  Resolved Problems Shock AKI  Assessment & Plan:   Acute hypoxic respiratory failure secondary to left pleural effusion -S/p chest tube placement and intrapleural lytics -Initially drained about 2 L and now minimal output with worsening chest x-ray findings -We will repeat chest x-ray today -If significant collection of fluid, will consider a CT scan of the chest to assess fluid loculation  Left lower lobe mass which appears to be pleural-based -IR, biopsy attempt once more stable in a few weeks  Pneumonia -On empiric antibiotics -Plan is 7 days of antibiotics -Urine strep and Legionella pending  Atrial fibrillation with RVR -On amiodarone -Low-dose beta-blockers -Anticoagulation once stable from chest tube perspective  Descending colitis -On Flagyl -To complete 8 to 9 days of treatment  Anemia critical illness -Continue to monitor  Electrolyte derangement -Replete magnesium and potassium  Mechanical fall secondary to weakness and severe fraility-  CT Head/Cspine negative for AICA/fracture. Plan - PT recommending short term SNF.  Best practice:  Diet: regular 10/26/21  Pain/Anxiety/Delirium protocol (if indicated): x  VAP protocol (if indicated): x  DVT prophylaxis: SQ lovenox. Will need fulll AC asap due to a fib -> ? After chest tube comes out. Needs cards opinion  GI prophylaxis: x  Glucose control: ssi  Mobility: bed rest  Code Status: DNR but full medical care  Family  Communication:  -per primary  Disposition: per primary  LABS    PULMONARY Recent Labs  Lab 10/25/21 1033  PHART 7.5*  PCO2ART 27*  PO2ART 93  HCO3 21.1  O2SAT 99.5    CBC Recent Labs  Lab 10/28/21 0440 10/29/21 0443 10/30/21 0521  HGB 10.9* 10.7* 11.1*  HCT 34.8* 34.6* 34.6*  WBC 11.3* 11.2* 13.7*  PLT 461* 430* 374    COAGULATION Recent Labs  Lab 10/25/21 1048  INR 1.5*    CARDIAC  No results for input(s): "TROPONINI" in the last 168 hours. No results for input(s): "PROBNP" in the last 168 hours.   CHEMISTRY Recent Labs  Lab 10/24/21 0008 10/25/21 1048 10/26/21 0422 10/27/21 0433 10/27/21 1021 10/28/21 0440 10/29/21 0443 10/29/21 0525 10/30/21 0521  NA 138   < > 132* 133* 137 141 138  --  137  K 3.9   < > 3.8 4.0 3.8 3.6 3.5  --  3.0*  CL 104   < > 99 101 105 109 105  --  102  CO2 20*   < > '24 25 24 27 28  '$ --  30  GLUCOSE 105*   < > 167* 127* 144* 120* 128*  --  95  BUN 19   < > 29* 34* 35* 30* 24*  --  16  CREATININE 0.89   < > 1.04* 1.01* 1.17* 1.12* 1.04*  --  0.78  CALCIUM 8.8*   < > 8.4* 7.8* 7.9* 8.0* 7.5*  --  7.4*  MG 1.6*  --  2.0  --   --  1.7  --  1.9 1.4*  PHOS 3.5  --   --   --   --  2.2*  --  3.4  --    < > = values in this interval not displayed.   Estimated Creatinine Clearance: 47.3 mL/min (by C-G formula based on SCr of 0.78 mg/dL).   LIVER Recent Labs  Lab 10/23/21 1545 10/25/21 1048 10/26/21 0422 10/27/21 1021 10/28/21 0440  AST 16 12* 12* 18 31  ALT '9 8 7 9 15  '$ ALKPHOS 115 92 112 101 115  BILITOT 1.3* 0.6 0.7 0.4 0.5  PROT 7.0  7.1 5.3* 5.3* 4.8* 5.1*  ALBUMIN 3.2* 2.2* 2.3* 2.0* 2.0*  INR  --  1.5*  --   --   --      INFECTIOUS Recent Labs  Lab 10/25/21 1350 10/25/21 1725 10/26/21 0422 10/27/21 0433 10/27/21 1021  LATICACIDVEN 1.7 1.6  --   --  2.4*  PROCALCITON  --   --  1.14 0.90 0.83   ENDOCRINE CBG (last 3)  Recent Labs    10/29/21 0754 10/29/21 1715 10/30/21 0737  GLUCAP 86 99 84    IMAGING x48h  - image(s) personally visualized  -   highlighted in bold DG CHEST PORT 1 VIEW  Result Date: 10/29/2021 CLINICAL DATA:  Pleural effusion in a female at age 37. EXAM: PORTABLE CHEST 1 VIEW COMPARISON:  October 27, 2021. FINDINGS: EKG leads project over the chest. LEFT-sided chest tube remains in place, small bore tube. Since the previous exam there is increased pleural fluid in the LEFT chest along the lateral chest and extending to the apex. This shows a loculated appearance and while of smaller volume than the study of October 23, 2021 is increased since the most recent exam. Cardiomediastinal contours and hilar structures grossly stable though largely obscured in the LEFT chest due to increase in pleural fluid. Graded opacity also suggested at the RIGHT lung base. No visible pneumothorax. On limited assessment no acute skeletal process with RIGHT-sided PICC line in place terminating at the caval to atrial junction. IMPRESSION: 1. Increasing pleural fluid along the lateral and apical chest showing slow increase over a series of recent comparison studies. 2. Chest tube remaining in place, similar position. 3. Likely small effusion at the RIGHT lung base. 4. No visible pneumothorax. Electronically Signed   By: Zetta Bills M.D.   On: 10/29/2021 08:21    Sherrilyn Rist, MD [place] [facility] Pager: See Shea Evans

## 2021-10-30 NOTE — Progress Notes (Signed)
[facility] ADULT ICU REPLACEMENT PROTOCOL   The patient does apply for the [facility] Adult ICU Electrolyte Replacment Protocol based on the criteria listed below:   1.Exclusion criteria: TCTS patients, ECMO patients, and Dialysis patients 2. Is GFR >/= 30 ml/min? Yes.    Patient's GFR today is >60 3. Is SCr </= 2? Yes.   Patient's SCr is 0.78 mg/dL 4. Did SCr increase >/= 0.5 in 24 hours? No. 5.Pt's weight >40kg  Yes.   6. Abnormal electrolyte(s): K+ 3.0, Mag 1.4  7. Electrolytes replaced per protocol 8.  Call MD STAT for K+ </= 2.5, Phos </= 1, or Mag </= 1 Physician:  Ignacia Marvel 10/30/2021 5:53 AM

## 2021-10-30 NOTE — Progress Notes (Signed)
PROGRESS NOTE    [name]  XBD:[id number] DOB: 1935/01/28 DOA: 10/23/2021 PCP: [name], MD    Chief Complaint  Patient presents with   Fall    Brief Narrative:  87 year old woman who presented to [facility] ED 9/6 post-mechanical fall, also with decreased PO intake/weakness x 5 days  pt reporting hitting  posterior head while getting out of the bathroom, no loss of conscious ness. CT Head/Cspine NAICA, no fracture. CXR with large L pleural effusion. CTA Chest negative for PE, large left pleural effusion noted with R mediastinal shift, occluded LLL bronchus, concern for poorly defined LLL mass. PCCM consulted for chest tube placement/new LLL mass. Patient became hypotensive requiring Neosynephrine initiation and was admitted to ICU. She is weaned off vasopressors. She currently has chest tube placed on 10/23/21. Patient seen and examined this morning.  She denies any new complaints. Reports being tired.  Repeat CXR shows persistent loculated pleural effusion. Follow up CT of the chest ordered by pCCM.   Assessment & Plan:   Principal Problem:   Pleural effusion on left Active Problems:   Lung mass   Paroxysmal atrial fibrillation with RVR (HCC)   Pituitary adenoma (HCC)   Hyperlipidemia   Essential hypertension   Obstructive sleep apnea   Type 2 diabetes mellitus with neurological complications (HCC)   COPD (chronic obstructive pulmonary disease) (HCC)   Aortic atherosclerosis and peripheral vascular disease   Hypotension   Acute respiratory failure with hypoxia (HCC)   Hypomagnesemia   Shock circulatory (HCC)   Severe aortic stenosis   Shock ([place])   Acute respiratory failure with hypoxia secondary to a large left pleural effusion.  S/p evacuation with chest tube placement on 10/23/2021, s/p fibrinolytic on 10/24/2021, PCCM repeated the dose of fibrinolysis on 10/29/21. Continue with [place] oxygen to keep sats greater than 90%. Currently requiring between 2 to 3 lit [place]  oxygen.  Cytology non diagnostic.  Chest tube to remain for now. Repeat CXR this am shows persistent partially loculated pleural effusion with atelectasis. Further recommendations as per pulmonology.  Continue with CPAP if she is agreeable.     New left lower lobe mass 6 cm and pleural based:  IR consulted for a biopsy, recommended repeat CT in 6 weeks before biopsy is attempted.  Continue with empiric antibiotics for a total of 7 days.    Atrial fibrillation with RVR, severe aortic stenosis Rate controlled with amiodarone.  Holding anti coagulation due to use of fibrinolytics.  Restart eliquis when okay from pulm stand point.     Hypertension;  BP parameters are optimal.    Circulatory shock secondary to colitis Appears to have resolved.  Continue the course of flagyl for colitis.  Off pressors.  BP parameters have improved.    AKI;  Creatinine back to baseline with IV lfuids.  Resolved.     Anemia of critical illness:  Transfuse to keep hemoglobin greater than 8.  Hemoglobin is stable around 10.     Lactic acidosis:  From SIRS.   H/o mechanical fall sec to weakness  Imaging is negative.  Therapy evaluations.    Hypokalemia , Hypomagnesemia and hypophosphatemia.  Replaced. Recheck levels in am.   Disposition:  Therapy eval recommending SNF. [facility].  Daughter wants to have palliative care meeting to discuss goals of care and wants to focus on patient being comfortable .  Palliative care consult placed.      DVT prophylaxis: scd's Code Status: DNR  Family Communication: none at bedside.  Disposition:   Status is: Inpatient Remains inpatient appropriate because: chest tube placement.    Level of care: ICU Consultants:  PCCM CARDIOLOGY.   Procedures:  Chest tube placement.   Antimicrobials:  Antibiotics Given (last 72 hours)     Date/Time Action Medication Dose Rate   10/27/21 1300 New Bag/Given    ceFEPIme (MAXIPIME) 2 g in sodium chloride 0.9 % 100 mL IVPB 2 g 200 mL/hr   10/27/21 1600 New Bag/Given   metroNIDAZOLE (FLAGYL) IVPB 500 mg 500 mg 100 mL/hr   10/28/21 0015 New Bag/Given   ceFEPIme (MAXIPIME) 2 g in sodium chloride 0.9 % 100 mL IVPB 2 g 200 mL/hr   10/28/21 0434 New Bag/Given   metroNIDAZOLE (FLAGYL) IVPB 500 mg 500 mg 100 mL/hr   10/28/21 1228 New Bag/Given   ceFEPIme (MAXIPIME) 2 g in sodium chloride 0.9 % 100 mL IVPB 2 g 200 mL/hr   10/28/21 1735 New Bag/Given   metroNIDAZOLE (FLAGYL) IVPB 500 mg 500 mg 100 mL/hr   10/29/21 0015 New Bag/Given   ceFEPIme (MAXIPIME) 2 g in sodium chloride 0.9 % 100 mL IVPB 2 g 200 mL/hr   10/29/21 0428 New Bag/Given   metroNIDAZOLE (FLAGYL) IVPB 500 mg 500 mg 100 mL/hr   10/29/21 1247 New Bag/Given   ceFEPIme (MAXIPIME) 2 g in sodium chloride 0.9 % 100 mL IVPB 2 g 200 mL/hr   10/29/21 1717 New Bag/Given   metroNIDAZOLE (FLAGYL) IVPB 500 mg 500 mg 100 mL/hr   10/30/21 0050 New Bag/Given   ceFEPIme (MAXIPIME) 2 g in sodium chloride 0.9 % 100 mL IVPB 2 g 200 mL/hr   10/30/21 0413 New Bag/Given   metroNIDAZOLE (FLAGYL) IVPB 500 mg 500 mg 100 mL/hr       Subjective: Tired, no new complaints.   Objective: Vitals:   10/30/21 0500 10/30/21 0600 10/30/21 0700 10/30/21 0757  BP:  (!) 147/53 (!) 154/63   Pulse:  84 88   Resp:  (!) 6 14   Temp:    97.9 F (36.6 C)  TempSrc:    Oral  SpO2:  91% 90%   Weight: 67.2 kg     Height:        Intake/Output Summary (Last 24 hours) at 10/30/2021 0901 Last data filed at 10/30/2021 0600 Gross per 24 hour  Intake 844.67 ml  Output 1200 ml  Net -355.33 ml    Filed Weights   10/28/21 0500 10/29/21 0400 10/30/21 0500  Weight: 60 kg 63.5 kg 67.2 kg    Examination:  General exam: elderly woman, not in distress.  Respiratory system: diminished air entry o the left base > right. On 2 lit of [place] oxygen oxygen.  Cardiovascular system: S1 & S2 heard, RRR. No pedal edema.  Gastrointestinal  system: Abdomen is nondistended, soft and nontender. Normal bowel sounds heard. Central nervous system: Alert and oriented. No focal neurological deficits. Extremities: Symmetric 5 x 5 power. Skin: No rashes, lesions or ulcers Psychiatry:  Mood & affect appropriate.       Data Reviewed: I have personally reviewed following labs and imaging studies  CBC: Recent Labs  Lab 10/23/21 1545 10/24/21 0008 10/27/21 0433 10/27/21 1021 10/28/21 0440 10/29/21 0443 10/30/21 0521  WBC 10.3   < > 13.3* 13.0* 11.3* 11.2* 13.7*  NEUTROABS 8.3*  --   --   --   --   --   --   HGB 13.6   < > 11.3* 11.1* 10.9*  10.7* 11.1*  HCT 43.4   < > 35.5* 34.9* 34.8* 34.6* 34.6*  MCV 89.9   < > 88.5 89.5 88.8 90.1 88.0  PLT 572*   < > 472* 485* 461* 430* 374   < > = values in this interval not displayed.     Basic Metabolic Panel: Recent Labs  Lab 10/24/21 0008 10/25/21 1048 10/26/21 0422 10/27/21 0433 10/27/21 1021 10/28/21 0440 10/29/21 0443 10/29/21 0525 10/30/21 0521  NA 138   < > 132* 133* 137 141 138  --  137  K 3.9   < > 3.8 4.0 3.8 3.6 3.5  --  3.0*  CL 104   < > 99 101 105 109 105  --  102  CO2 20*   < > '24 25 24 27 28  '$ --  30  GLUCOSE 105*   < > 167* 127* 144* 120* 128*  --  95  BUN 19   < > 29* 34* 35* 30* 24*  --  16  CREATININE 0.89   < > 1.04* 1.01* 1.17* 1.12* 1.04*  --  0.78  CALCIUM 8.8*   < > 8.4* 7.8* 7.9* 8.0* 7.5*  --  7.4*  MG 1.6*  --  2.0  --   --  1.7  --  1.9 1.4*  PHOS 3.5  --   --   --   --  2.2*  --  3.4  --    < > = values in this interval not displayed.     GFR: Estimated Creatinine Clearance: 47.3 mL/min (by C-G formula based on SCr of 0.78 mg/dL).  Liver Function Tests: Recent Labs  Lab 10/23/21 1545 10/25/21 1048 10/26/21 0422 10/27/21 1021 10/28/21 0440  AST 16 12* 12* 18 31  ALT '9 8 7 9 15  '$ ALKPHOS 115 92 112 101 115  BILITOT 1.3* 0.6 0.7 0.4 0.5  PROT 7.0  7.1 5.3* 5.3* 4.8* 5.1*  ALBUMIN 3.2* 2.2* 2.3* 2.0* 2.0*     CBG: Recent Labs   Lab 10/28/21 0757 10/28/21 1646 10/29/21 0754 10/29/21 1715 10/30/21 0737  GLUCAP 91 120* 86 99 84      Recent Results (from the past 240 hour(s))  Resp Panel by RT-PCR (Flu A&B, Covid) Anterior Nasal Swab     Status: None   Collection Time: 10/23/21  4:21 PM   Specimen: Anterior Nasal Swab  Result Value Ref Range Status   SARS Coronavirus 2 by RT PCR NEGATIVE NEGATIVE Final    Comment: (NOTE) SARS-CoV-2 target nucleic acids are NOT DETECTED.  The SARS-CoV-2 RNA is generally detectable in upper respiratory specimens during the acute phase of infection. The lowest concentration of SARS-CoV-2 viral copies this assay can detect is 138 copies/mL. A negative result does not preclude SARS-Cov-2 infection and should not be used as the sole basis for treatment or other patient management decisions. A negative result may occur with  improper specimen collection/handling, submission of specimen other than nasopharyngeal swab, presence of viral mutation(s) within the areas targeted by this assay, and inadequate number of viral copies(<138 copies/mL). A negative result must be combined with clinical observations, patient history, and epidemiological information. The expected result is Negative.  Fact Sheet for Patients:  EntrepreneurPulse.com.au  Fact Sheet for Healthcare Providers:  IncredibleEmployment.be  This test is no t yet approved or cleared by the [place] FDA and  has been authorized for detection and/or diagnosis of SARS-CoV-2 by FDA under an Emergency Use Authorization (EUA). This EUA will  remain  in effect (meaning this test can be used) for the duration of the COVID-19 declaration under Section 564(b)(1) of the Act, 21 U.S.C.section 360bbb-3(b)(1), unless the authorization is terminated  or revoked sooner.       Influenza A by PCR NEGATIVE NEGATIVE Final   Influenza B by PCR NEGATIVE NEGATIVE Final    Comment: (NOTE) The  Xpert Xpress SARS-CoV-2/FLU/RSV plus assay is intended as an aid in the diagnosis of influenza from Nasopharyngeal swab specimens and should not be used as a sole basis for treatment. Nasal washings and aspirates are unacceptable for Xpert Xpress SARS-CoV-2/FLU/RSV testing.  Fact Sheet for Patients: EntrepreneurPulse.com.au  Fact Sheet for Healthcare Providers: IncredibleEmployment.be  This test is not yet approved or cleared by the [place] FDA and has been authorized for detection and/or diagnosis of SARS-CoV-2 by FDA under an Emergency Use Authorization (EUA). This EUA will remain in effect (meaning this test can be used) for the duration of the COVID-19 declaration under Section 564(b)(1) of the Act, 21 U.S.C. section 360bbb-3(b)(1), unless the authorization is terminated or revoked.  Performed at [facility], [place] [address] [place], [place] [address]   Body fluid culture w Gram Stain     Status: None   Collection Time: 10/23/21  7:19 PM   Specimen: Pleura; Body Fluid  Result Value Ref Range Status   Specimen Description   Final    PLEURAL Performed at [facility] [address], [place], [place] [address]    Special Requests   Final    NONE Performed at [facility], [place] [address] [place], [place] [address]    Gram Stain   Final    NO ORGANISMS SEEN RARE WBC PRESENT, PREDOMINANTLY PMN    Culture   Final    NO GROWTH 3 DAYS Performed at [facility] [address] [place], [place] [address]    Report Status 10/27/2021 FINAL  Final  MRSA Next Gen by PCR, Nasal     Status: None   Collection Time: 10/23/21  9:53 PM   Specimen: Nasal Mucosa; Nasal Swab  Result Value Ref Range Status   MRSA by PCR Next Gen NOT DETECTED NOT DETECTED Final    Comment: (NOTE) The GeneXpert MRSA Assay (FDA approved for NASAL specimens only), is one component of a comprehensive  MRSA colonization surveillance program. It is not intended to diagnose MRSA infection nor to guide or monitor treatment for MRSA infections. Test performance is not FDA approved in patients less than 54 years old. Performed at [facility], [place] [address] [place], [place] [address]          Radiology Studies: [facility] Chest Port 1 View  Result Date: 10/30/2021 CLINICAL DATA:  Increased shortness of breath this morning EXAM: PORTABLE CHEST 1 VIEW COMPARISON:  Portable exam 0817 hours compared to 10/29/2021 FINDINGS: LEFT thoracostomy tube stable. RIGHT arm PICC line tip projects over SVC. Normal heart size, mediastinal contours, and pulmonary vascularity. Atherosclerotic calcification aorta. Persistent partially loculated moderate-sized LEFT pleural effusion and basilar atelectasis. Mild RIGHT basilar atelectasis as well. No pneumothorax or acute osseous findings. IMPRESSION: Persistent partially loculated LEFT pleural effusion and LEFT lung atelectasis. Subsegmental atelectasis RIGHT base. Electronically Signed   By: Lavonia Dana M.D.   On: 10/30/2021 08:41   DG CHEST PORT 1 VIEW  Result Date: 10/29/2021 CLINICAL DATA:  Pleural effusion in a female at age 76. EXAM: PORTABLE CHEST 1 VIEW COMPARISON:  October 27, 2021. FINDINGS: EKG leads project over the chest. LEFT-sided chest tube remains in place, small bore tube. Since the previous exam there is increased pleural fluid in the LEFT chest along the lateral chest and extending to the apex. This shows a loculated appearance and while of smaller volume than the study of October 23, 2021 is increased since the most recent exam. Cardiomediastinal contours and hilar structures grossly stable though largely obscured in the LEFT chest due to increase in pleural fluid. Graded opacity also suggested at the RIGHT lung base. No visible pneumothorax. On limited assessment no acute skeletal process with RIGHT-sided PICC line in place  terminating at the caval to atrial junction. IMPRESSION: 1. Increasing pleural fluid along the lateral and apical chest showing slow increase over a series of recent comparison studies. 2. Chest tube remaining in place, similar position. 3. Likely small effusion at the RIGHT lung base. 4. No visible pneumothorax. Electronically Signed   By: Zetta Bills M.D.   On: 10/29/2021 08:21        Scheduled Meds:  amiodarone  200 mg Oral BID   atorvastatin  80 mg Oral QHS   Chlorhexidine Gluconate Cloth  6 each Topical Daily   enoxaparin (LOVENOX) injection  40 mg Subcutaneous Q24H   metoprolol tartrate  12.5 mg Oral BID   potassium chloride  20 mEq Oral Q4H   sodium chloride flush  10 mL Intrapleural Q8H   sodium chloride flush  10-40 mL Intracatheter Q12H   Continuous Infusions:  sodium chloride Stopped (10/28/21 1602)   ceFEPime (MAXIPIME) IV Stopped (10/30/21 0120)   metronidazole Stopped (10/30/21 0513)   potassium chloride 10 mEq (10/30/21 0750)     LOS: 7 days        Hosie Poisson, MD [facility]   To contact the attending provider between 7A-7P or the covering provider during after hours 7P-7A, please log into the web site [email or web address] and access using universal [place] password for that web site. If you do not have the password, please call the hospital operator.  10/30/2021, 9:01 AM

## 2021-10-30 NOTE — Progress Notes (Signed)
Chest x-ray reviewed showing chest tube in appropriate position however there is buildup of fluid  No significant drainage from chest tube following instillation of fibrinolytics  A CT scan will further delineate loculation

## 2021-10-31 ENCOUNTER — Inpatient Hospital Stay (HOSPITAL_COMMUNITY): Payer: Medicare PPO

## 2021-10-31 DIAGNOSIS — I951 Orthostatic hypotension: Secondary | ICD-10-CM | POA: Diagnosis not present

## 2021-10-31 DIAGNOSIS — J90 Pleural effusion, not elsewhere classified: Secondary | ICD-10-CM | POA: Diagnosis not present

## 2021-10-31 DIAGNOSIS — I700 Atherosclerosis of aorta: Secondary | ICD-10-CM | POA: Diagnosis not present

## 2021-10-31 DIAGNOSIS — I10 Essential (primary) hypertension: Secondary | ICD-10-CM | POA: Diagnosis not present

## 2021-10-31 LAB — BASIC METABOLIC PANEL
Anion gap: 4 — ABNORMAL LOW (ref 5–15)
BUN: 15 mg/dL (ref 8–23)
CO2: 30 mmol/L (ref 22–32)
Calcium: 7.4 mg/dL — ABNORMAL LOW (ref 8.9–10.3)
Chloride: 100 mmol/L (ref 98–111)
Creatinine, Ser: 0.73 mg/dL (ref 0.44–1.00)
GFR, Estimated: >60 mL/min (ref >60)
Glucose, Bld: 85 mg/dL (ref 70–99)
Potassium: 3.6 mmol/L (ref 3.5–5.1)
Sodium: 134 mmol/L — ABNORMAL LOW (ref 135–145)

## 2021-10-31 LAB — CBC WITH DIFFERENTIAL/PLATELET
Abs Immature Granulocytes: 0.55 10*3/uL — ABNORMAL HIGH (ref 0.00–0.07)
Basophils Absolute: 0.1 10*3/uL (ref 0.0–0.1)
Basophils Relative: 1 %
Eosinophils Absolute: 0.1 10*3/uL (ref 0.0–0.5)
Eosinophils Relative: 1 %
HCT: 33.6 % — ABNORMAL LOW (ref 36.0–46.0)
Hemoglobin: 10.8 g/dL — ABNORMAL LOW (ref 12.0–15.0)
Immature Granulocytes: 4 %
Lymphocytes Relative: 7 %
Lymphs Abs: 0.8 10*3/uL (ref 0.7–4.0)
MCH: 28.3 pg (ref 26.0–34.0)
MCHC: 32.1 g/dL (ref 30.0–36.0)
MCV: 88 fL (ref 80.0–100.0)
Monocytes Absolute: 1.8 10*3/uL — ABNORMAL HIGH (ref 0.1–1.0)
Monocytes Relative: 14 %
Neutro Abs: 9.6 10*3/uL — ABNORMAL HIGH (ref 1.7–7.7)
Neutrophils Relative %: 73 %
Platelets: 370 10*3/uL (ref 150–400)
RBC: 3.82 MIL/uL — ABNORMAL LOW (ref 3.87–5.11)
RDW: 16.2 % — ABNORMAL HIGH (ref 11.5–15.5)
WBC: 13 10*3/uL — ABNORMAL HIGH (ref 4.0–10.5)
nRBC: 0 % (ref 0.0–0.2)

## 2021-10-31 LAB — GLUCOSE, CAPILLARY
Glucose-Capillary: 100 mg/dL — ABNORMAL HIGH (ref 70–99)
Glucose-Capillary: 69 mg/dL — ABNORMAL LOW (ref 70–99)
Glucose-Capillary: 100 mg/dL — ABNORMAL HIGH (ref 70–99)
Glucose-Capillary: 92 mg/dL (ref 70–99)

## 2021-10-31 LAB — MAGNESIUM: Magnesium: 2 mg/dL (ref 1.7–2.4)

## 2021-10-31 LAB — PHOSPHORUS: Phosphorus: 2.6 mg/dL (ref 2.5–4.6)

## 2021-10-31 MED ORDER — SODIUM CHLORIDE (PF) 0.9 % IJ SOLN
10.0000 mg | Freq: Once | INTRAMUSCULAR | Status: AC
  Administered 2021-10-31: 10 mg via INTRAPLEURAL
  Filled 2021-10-31: qty 10

## 2021-10-31 MED ORDER — ACETAMINOPHEN 325 MG PO TABS
650.0000 mg | ORAL_TABLET | ORAL | Status: DC | PRN
  Filled 2021-10-31 (×3): qty 2

## 2021-10-31 MED ORDER — STERILE WATER FOR INJECTION IJ SOLN
5.0000 mg | Freq: Once | RESPIRATORY_TRACT | Status: AC
  Administered 2021-10-31: 5 mg via INTRAPLEURAL
  Filled 2021-10-31: qty 5

## 2021-10-31 MED ORDER — FENTANYL CITRATE PF 50 MCG/ML IJ SOSY
12.5000 ug | PREFILLED_SYRINGE | Freq: Once | INTRAMUSCULAR | Status: AC
  Administered 2021-10-31: 12.5 ug via INTRAVENOUS
  Filled 2021-10-31: qty 1

## 2021-10-31 MED ORDER — SODIUM CHLORIDE 0.9% FLUSH
10.0000 mL | Freq: Three times a day (TID) | INTRAVENOUS | Status: DC
  Administered 2021-10-31 – 2021-11-03 (×8): 10 mL via INTRAPLEURAL

## 2021-10-31 MED ORDER — SODIUM CHLORIDE 0.9 % IV BOLUS
250.0000 mL | Freq: Once | INTRAVENOUS | Status: AC
  Administered 2021-10-31: 250 mL via INTRAVENOUS

## 2021-10-31 MED ORDER — TRAMADOL HCL 50 MG PO TABS
50.0000 mg | ORAL_TABLET | Freq: Four times a day (QID) | ORAL | Status: DC | PRN
  Administered 2021-10-31 – 2021-11-01 (×2): 50 mg via ORAL
  Filled 2021-10-31 (×4): qty 1

## 2021-10-31 NOTE — Progress Notes (Signed)
PROGRESS NOTE    [name]  UKG:[id number] DOB: 19-Sep-1934 DOA: 10/23/2021 PCP: [name], MD    Chief Complaint  Patient presents with   Fall    Brief Narrative:  87 year old woman who presented to [facility] ED 9/6 post-mechanical fall, also with decreased PO intake/weakness x 5 days  pt reporting hitting  posterior head while getting out of the bathroom, no loss of conscious ness. CT Head/Cspine NAICA, no fracture. CXR with large L pleural effusion. CTA Chest negative for PE, large left pleural effusion noted with R mediastinal shift, occluded LLL bronchus, concern for poorly defined LLL mass. PCCM consulted for chest tube placement/new LLL mass. Patient became hypotensive requiring Neosynephrine initiation and was admitted to ICU. She is weaned off vasopressors. She currently has chest tube placed on 10/23/21. Repeat CXR shows persistent loculated pleural effusion. Follow up CT of the chest  shows Moderate-to-large size loculated left pleural effusion has only minimally decreased in size status post pleural drainage catheter placement. Existing pleural catheter replaced and fibrinolytic administered today on 10/31/21.  Patient seen and examined, continued to require [place] oxygen, remains deconditioned and on [place] oxygen .   Assessment & Plan:   Principal Problem:   Pleural effusion on left Active Problems:   Lung mass   Paroxysmal atrial fibrillation with RVR (HCC)   Pituitary adenoma (HCC)   Hyperlipidemia   Essential hypertension   Obstructive sleep apnea   Type 2 diabetes mellitus with neurological complications (HCC)   COPD (chronic obstructive pulmonary disease) (HCC)   Aortic atherosclerosis and peripheral vascular disease   Hypotension   Acute respiratory failure with hypoxia (HCC)   Hypomagnesemia   Shock circulatory (HCC)   Severe aortic stenosis   Shock ([place])   Acute respiratory failure with hypoxia secondary to a large left pleural effusion.  S/p evacuation  with chest tube placement on 10/23/2021, s/p fibrinolytic on 10/24/2021, PCCM repeated the dose of fibrinolysis on 10/29/21 and 10/31/21 Continue with [place] oxygen to keep sats greater than 90%. Currently requiring between 2 to 3 lit [place] oxygen.  Cytology non diagnostic. PCCM on board and managing chest tube.  Replacement of chest tube today as repeat CT shows persistent loculated pleural effusion. Continue with CPAP if she is agreeable.     New left lower lobe mass 6 cm and pleural based:  IR consulted for a biopsy, recommended repeat CT in 6 weeks before biopsy is attempted.  Continue with empiric antibiotics for a total of 7 days.    Atrial fibrillation with RVR, severe aortic stenosis Rate controlled with amiodarone and metoprolol 12.5 mg BID.  Holding anti coagulation due to use of fibrinolytics.  Restart eliquis when okay from pulm stand point.     Hypertension;  BP parameters are well controlled.   Circulatory shock secondary to colitis Appears to have resolved.  Continue the course of flagyl for colitis.  Off pressors.  BP parameters have improved.    AKI;  Creatinine back to baseline with IV fluids.  Resolved.     Anemia of critical illness:  Transfuse to keep hemoglobin greater than 8.  Hemoglobin is stable around 10.     Lactic acidosis:  Resolved.   H/o mechanical fall sec to weakness  Imaging is negative.  Therapy evaluations.    Hypokalemia , Hypomagnesemia and hypophosphatemia.  Replaced. Recheck levels in am.   Disposition:  Therapy eval recommending SNF. [facility] from [facility].  Daughter wants to have palliative care meeting  to discuss goals of care and wants to focus on patient being comfortable .  Palliative care consult placed.      DVT prophylaxis: Lovenox.  Code Status: DNR Family Communication: none at bedside.  Disposition:   Status is: Inpatient Remains inpatient appropriate because: chest tube placement.     Level of care: ICU Consultants:  PCCM CARDIOLOGY.  Palliative care .   Procedures:  Chest tube placement.   Antimicrobials:  Antibiotics Given (last 72 hours)     Date/Time Action Medication Dose Rate   10/28/21 1228 New Bag/Given   ceFEPIme (MAXIPIME) 2 g in sodium chloride 0.9 % 100 mL IVPB 2 g 200 mL/hr   10/28/21 1735 New Bag/Given   metroNIDAZOLE (FLAGYL) IVPB 500 mg 500 mg 100 mL/hr   10/29/21 0015 New Bag/Given   ceFEPIme (MAXIPIME) 2 g in sodium chloride 0.9 % 100 mL IVPB 2 g 200 mL/hr   10/29/21 0428 New Bag/Given   metroNIDAZOLE (FLAGYL) IVPB 500 mg 500 mg 100 mL/hr   10/29/21 1247 New Bag/Given   ceFEPIme (MAXIPIME) 2 g in sodium chloride 0.9 % 100 mL IVPB 2 g 200 mL/hr   10/29/21 1717 New Bag/Given   metroNIDAZOLE (FLAGYL) IVPB 500 mg 500 mg 100 mL/hr   10/30/21 0050 New Bag/Given   ceFEPIme (MAXIPIME) 2 g in sodium chloride 0.9 % 100 mL IVPB 2 g 200 mL/hr   10/30/21 0413 New Bag/Given   metroNIDAZOLE (FLAGYL) IVPB 500 mg 500 mg 100 mL/hr   10/30/21 1354 New Bag/Given   ceFEPIme (MAXIPIME) 2 g in sodium chloride 0.9 % 100 mL IVPB 2 g 200 mL/hr   10/30/21 1725 New Bag/Given   metroNIDAZOLE (FLAGYL) IVPB 500 mg 500 mg 100 mL/hr   10/31/21 0141 New Bag/Given   ceFEPIme (MAXIPIME) 2 g in sodium chloride 0.9 % 100 mL IVPB 2 g 200 mL/hr   10/31/21 0442 New Bag/Given   metroNIDAZOLE (FLAGYL) IVPB 500 mg 500 mg 100 mL/hr       Subjective: Reports feeling tired, no chest pain . Cough present. No sob.  Objective: Vitals:   10/31/21 0345 10/31/21 0400 10/31/21 0500 10/31/21 0800  BP:  (!) 141/55    Pulse:  80    Resp:  16    Temp: 97.9 F (36.6 C)   98.3 F (36.8 C)  TempSrc: Oral   Oral  SpO2:  92%    Weight:   65.3 kg   Height:        Intake/Output Summary (Last 24 hours) at 10/31/2021 1134 Last data filed at 10/31/2021 0600 Gross per 24 hour  Intake 718.6 ml  Output 720 ml  Net -1.4 ml    Filed Weights   10/29/21 0400 10/30/21 0500 10/31/21 0500   Weight: 63.5 kg 67.2 kg 65.3 kg    Examination:  General exam: ill appearing elderly woman , no in distress.  Respiratory system: diminished air entry left >right. On [place] oxygen.  Cardiovascular system: S1 & S2 heard, RRR. No JVD,  No pedal edema. Gastrointestinal system: Abdomen is nondistended, soft and nontender.  Normal bowel sounds heard. Central nervous system: Alert and oriented to person and place.  Extremities: Symmetric 5 x 5 power. Skin: No rashes, lesions or ulcers Psychiatry:  Mood & affect appropriate.        Data Reviewed: I have personally reviewed following labs and imaging studies  CBC: Recent Labs  Lab 10/27/21 1021 10/28/21 0440 10/29/21 0443 10/30/21 0521 10/31/21 0422  WBC 13.0* 11.3* 11.2*  13.7* 13.0*  NEUTROABS  --   --   --   --  9.6*  HGB 11.1* 10.9* 10.7* 11.1* 10.8*  HCT 34.9* 34.8* 34.6* 34.6* 33.6*  MCV 89.5 88.8 90.1 88.0 88.0  PLT 485* 461* 430* 374 370     Basic Metabolic Panel: Recent Labs  Lab 10/26/21 0422 10/27/21 0433 10/27/21 1021 10/28/21 0440 10/29/21 0443 10/29/21 0525 10/30/21 0521 10/31/21 0422  NA 132*   < > 137 141 138  --  137 134*  K 3.8   < > 3.8 3.6 3.5  --  3.0* 3.6  CL 99   < > 105 109 105  --  102 100  CO2 24   < > '24 27 28  '$ --  30 30  GLUCOSE 167*   < > 144* 120* 128*  --  95 85  BUN 29*   < > 35* 30* 24*  --  16 15  CREATININE 1.04*   < > 1.17* 1.12* 1.04*  --  0.78 0.73  CALCIUM 8.4*   < > 7.9* 8.0* 7.5*  --  7.4* 7.4*  MG 2.0  --   --  1.7  --  1.9 1.4* 2.0  PHOS  --   --   --  2.2*  --  3.4  --  2.6   < > = values in this interval not displayed.     GFR: Estimated Creatinine Clearance: 46.8 mL/min (by C-G formula based on SCr of 0.73 mg/dL).  Liver Function Tests: Recent Labs  Lab 10/25/21 1048 10/26/21 0422 10/27/21 1021 10/28/21 0440  AST 12* 12* 18 31  ALT '8 7 9 15  '$ ALKPHOS 92 112 101 115  BILITOT 0.6 0.7 0.4 0.5  PROT 5.3* 5.3* 4.8* 5.1*  ALBUMIN 2.2* 2.3* 2.0* 2.0*      CBG: Recent Labs  Lab 10/29/21 1715 10/30/21 0737 10/30/21 1623 10/31/21 0807 10/31/21 0854  GLUCAP 99 84 100* 69* 100*      Recent Results (from the past 240 hour(s))  Resp Panel by RT-PCR (Flu A&B, Covid) Anterior Nasal Swab     Status: None   Collection Time: 10/23/21  4:21 PM   Specimen: Anterior Nasal Swab  Result Value Ref Range Status   SARS Coronavirus 2 by RT PCR NEGATIVE NEGATIVE Final    Comment: (NOTE) SARS-CoV-2 target nucleic acids are NOT DETECTED.  The SARS-CoV-2 RNA is generally detectable in upper respiratory specimens during the acute phase of infection. The lowest concentration of SARS-CoV-2 viral copies this assay can detect is 138 copies/mL. A negative result does not preclude SARS-Cov-2 infection and should not be used as the sole basis for treatment or other patient management decisions. A negative result may occur with  improper specimen collection/handling, submission of specimen other than nasopharyngeal swab, presence of viral mutation(s) within the areas targeted by this assay, and inadequate number of viral copies(<138 copies/mL). A negative result must be combined with clinical observations, patient history, and epidemiological information. The expected result is Negative.  Fact Sheet for Patients:  EntrepreneurPulse.com.au  Fact Sheet for Healthcare Providers:  IncredibleEmployment.be  This test is no t yet approved or cleared by the [place] FDA and  has been authorized for detection and/or diagnosis of SARS-CoV-2 by FDA under an Emergency Use Authorization (EUA). This EUA will remain  in effect (meaning this test can be used) for the duration of the COVID-19 declaration under Section 564(b)(1) of the Act, 21 U.S.C.section 360bbb-3(b)(1), unless the authorization is  terminated  or revoked sooner.       Influenza A by PCR NEGATIVE NEGATIVE Final   Influenza B by PCR NEGATIVE NEGATIVE  Final    Comment: (NOTE) The Xpert Xpress SARS-CoV-2/FLU/RSV plus assay is intended as an aid in the diagnosis of influenza from Nasopharyngeal swab specimens and should not be used as a sole basis for treatment. Nasal washings and aspirates are unacceptable for Xpert Xpress SARS-CoV-2/FLU/RSV testing.  Fact Sheet for Patients: EntrepreneurPulse.com.au  Fact Sheet for Healthcare Providers: IncredibleEmployment.be  This test is not yet approved or cleared by the [place] FDA and has been authorized for detection and/or diagnosis of SARS-CoV-2 by FDA under an Emergency Use Authorization (EUA). This EUA will remain in effect (meaning this test can be used) for the duration of the COVID-19 declaration under Section 564(b)(1) of the Act, 21 U.S.C. section 360bbb-3(b)(1), unless the authorization is terminated or revoked.  Performed at [facility], [place] [address] [place], [place] [address]   Body fluid culture w Gram Stain     Status: None   Collection Time: 10/23/21  7:19 PM   Specimen: Pleura; Body Fluid  Result Value Ref Range Status   Specimen Description   Final    PLEURAL Performed at [place] [address] [place], [place] [address]    Special Requests   Final    NONE Performed at [facility], [place] [address], [place], [place] [address]    Gram Stain   Final    NO ORGANISMS SEEN RARE WBC PRESENT, PREDOMINANTLY PMN    Culture   Final    NO GROWTH 3 DAYS Performed at [place] [address] [place], [place] [address]    Report Status 10/27/2021 FINAL  Final  MRSA Next Gen by PCR, Nasal     Status: None   Collection Time: 10/23/21  9:53 PM   Specimen: Nasal Mucosa; Nasal Swab  Result Value Ref Range Status   MRSA by PCR Next Gen NOT DETECTED NOT DETECTED Final    Comment: (NOTE) The GeneXpert MRSA Assay (FDA approved for NASAL specimens only), is one  component of a comprehensive MRSA colonization surveillance program. It is not intended to diagnose MRSA infection nor to guide or monitor treatment for MRSA infections. Test performance is not FDA approved in patients less than 73 years old. Performed at [facility], [place] [address] [place], [place] [address]          Radiology Studies: CT CHEST WO CONTRAST  Result Date: 10/30/2021 CLINICAL DATA:  Pleural effusion.  Malignancy suspected. EXAM: CT CHEST WITHOUT CONTRAST TECHNIQUE: Multidetector CT imaging of the chest was performed following the standard protocol without IV contrast. RADIATION DOSE REDUCTION: This exam was performed according to the departmental dose-optimization program which includes automated exposure control, adjustment of the mA and/or kV according to patient size and/or use of iterative reconstruction technique. COMPARISON:  Chest x-ray 10/30/2021. CT abdomen and pelvis 10/25/2021. CT angiogram chest 10/23/2021 FINDINGS: Cardiovascular: Aorta is normal in size. There are atherosclerotic calcifications of the aorta and coronary arteries. The heart is normal in size. There is no pericardial effusion. Right upper extremity PICC terminates in the SVC. Mediastinum/Nodes: There is an enlarged subcarinal lymph node measuring 1.9 by 3.0 cm. This is similar to prior study. Thyroid gland, trachea, and esophagus demonstrate no significant findings. Difficult to assess for left hilar adenopathy secondary to lack of contrast. Lungs/Pleura: There is a moderate-to-large  sized left pleural effusion which is partially loculated. This is only minimally decreased in size. New left pleural drainage catheter is in place in the anterior lower hemithorax. There is no pneumothorax. There is compressive atelectasis of the left upper lobe and left lower lobe. Underlying left lower lobe mass would be difficult to exclude. Faint masslike opacity is seen in the lower posterior left  pleural space measuring 3.5 x 7.6 cm image 2/122. There is a small layering right pleural effusion. There is atelectasis of the right lower lobe. Mild emphysematous changes are present. Upper Abdomen: No acute abnormality. Musculoskeletal: Mild compression deformity of T3 appears chronic and unchanged. Vertebroplasty changes at T11 are unchanged. No acute fracture or focal osseous lesion identified. IMPRESSION: 1. Moderate-to-large size loculated left pleural effusion has only minimally decreased in size status post pleural drainage catheter placement. 2. There continues to be compressive atelectasis of the left lower lobe. An underlying left lower lobe mass can not be excluded. 3. Unchanged pleural-based masslike density in the inferior left hemithorax measuring 3.5 x 7.6 cm. Malignancy not excluded. 4. Stable subcarinal pathologic lymphadenopathy. 5. Small right pleural effusion is new from prior. 6. Aortic Atherosclerosis (ICD10-I70.0) and Emphysema (ICD10-J43.9). Electronically Signed   By: Ronney Asters M.D.   On: 10/30/2021 21:39   DG Chest Port 1 View  Result Date: 10/30/2021 CLINICAL DATA:  Increased shortness of breath this morning EXAM: PORTABLE CHEST 1 VIEW COMPARISON:  Portable exam 0817 hours compared to 10/29/2021 FINDINGS: LEFT thoracostomy tube stable. RIGHT arm PICC line tip projects over SVC. Normal heart size, mediastinal contours, and pulmonary vascularity. Atherosclerotic calcification aorta. Persistent partially loculated moderate-sized LEFT pleural effusion and basilar atelectasis. Mild RIGHT basilar atelectasis as well. No pneumothorax or acute osseous findings. IMPRESSION: Persistent partially loculated LEFT pleural effusion and LEFT lung atelectasis. Subsegmental atelectasis RIGHT base. Electronically Signed   By: Lavonia Dana M.D.   On: 10/30/2021 08:41        Scheduled Meds:  alteplase (CATHFLO ACTIVASE) 10 mg in sodium chloride (PF) 0.9 % 30 mL  10 mg Intrapleural Once   And    dornase alfa (PULMOZYME) 5 mg in sterile water (preservative free) 30 mL  5 mg Intrapleural Once   amiodarone  200 mg Oral BID   atorvastatin  80 mg Oral QHS   Chlorhexidine Gluconate Cloth  6 each Topical Daily   enoxaparin (LOVENOX) injection  40 mg Subcutaneous Q24H   metoprolol tartrate  12.5 mg Oral BID   sodium chloride flush  10 mL Intrapleural Q8H   sodium chloride flush  10 mL Intrapleural Q8H   sodium chloride flush  10-40 mL Intracatheter Q12H   Continuous Infusions:  sodium chloride Stopped (10/28/21 1602)   ceFEPime (MAXIPIME) IV Stopped (10/31/21 0211)   metronidazole Stopped (10/31/21 0542)   sodium chloride       LOS: 8 days        Hosie Poisson, MD [facility]   To contact the attending provider between 7A-7P or the covering provider during after hours 7P-7A, please log into the web site [email or web address] and access using universal [place] password for that web site. If you do not have the password, please call the hospital operator.  10/31/2021, 11:34 AM

## 2021-10-31 NOTE — Progress Notes (Signed)
PT Cancellation Note  Patient Details Name: [name] MRN: [id number] DOB: [birth year]   Cancelled Treatment:    Reason Eval/Treat Not Completed: Medical issues which prohibited therapy, new CT placed. [place] Office [phone] Weekend [phone]    Claretha Cooper 10/31/2021, 11:25 AM

## 2021-10-31 NOTE — Progress Notes (Signed)
NAME:  [name], MRN:  [id number], DOB:  November 28, 1934, LOS: 8 ADMISSION DATE:  10/23/2021 CONSULTATION DATE:  10/23/2021 REFERRING MD:  [place] - EDP CHIEF COMPLAINT:  Hypotension in the setting of Afib with RVR   BRIEF  87 year old woman who presented to [facility] ED 9/6 post-mechanical fall, also with decreased PO intake/weakness x 5 days. Hit posterior head while getting out of the bathroom; no LOC. Not on AC. PMHx significant for HTN, HLD, COPD/OSA, T2DM, fibromuscular dysplasia, pseudogout.  Per chart review, patient had been feeling very weak and sustained a mechanical fall when ambulating back from the restroom. She struck her posterior head on the floor but did not have LOC. Denies recent fever/chills, CP/SOB, n/v/d or abdominal pain. Endorses HA s/p fall. EMS noted mild hypotension in the field, initially corrected after fluid administration.  On ED evaluation, afebrile with HR 100s, mildly tachypneic to 25, BP 129/62, SpO2 91% on RA. Labs notable for WBC/H&H WNL, Plt 572, lytes WNL, Cr 1.03 (baseline 0.8), normal AST/ALT, Tbili 1.3. Serum LDH 213, trop WNL. LA 1.6. COVID/Flu negative. CT Head/Cspine NAICA, no fracture. CXR with large L pleural effusion. CTA Chest negative for PE, large left pleural effusion noted with R mediastinal shift, occluded LLL bronchus, concern for poorly defined LLL mass.   PCCM consulted for chest tube placement/new LLL mass. Patient became hypotensive requiring Neosynephrine initiation and was admitted to ICU.  Pertinent Medical History:   has a past medical history of Allergic rhinitis, Arthritis, Complication of anesthesia, COPD (chronic obstructive pulmonary disease) ([place]), Diabetes mellitus, Fibromuscular dysplasia ([place]), Hyperlipidemia, Hypertension, PONV (postoperative nausea and vomiting), Pseudogout, and Sleep apnea.   has a past surgical history that includes pituitary tumor removed; Back surgery (2005); Tonsillectomy; nasal septal deviation; bilateral  renal bypass; and Esophagogastroduodenoscopy (N/A, 10/01/2012).   Significant Hospital Events: Including procedures, antibiotic start and stop dates in addition to other pertinent events   9/6 - Presented to [facility] ED for mechanical fall with weakness, poor PO intake. CT Chest negative for PE, c/f new LLL mass and large L pleural effusion with R mediastinal shift. CT Head/Cspine negative. Chest tube placed.  9/7 intrapleural lytics administered dose 1 -> 750cc  out. -> CT abd lung cut 9/8 with very small residual effusion only  9/8 - circulatory shock with colitis. RUE PICC Placed. Started flagyl IV  10/25/21: approx 222h ago -> suddent shock with vomitting and abd pain. CT with Descending COLITIS. -> Flagyl started. On levophed and hydrocor. ECHO with severe AS and EF > 70% and hyperdymanic -> gioven fluids -> this AM  feels better. Looks better. Hungry and wants to eat. cPAP at her side - says she used 15 years ago for 1-2 years and tried last night but did not tolerate. Per RN - not making urine. -> CREAT UP 1.'04mg'$ % AMio gtt off x 22h. HR 88 - > monitor says sinus. On levophed 27mg -> MAP 70 with low diast. NSR 9/11  Overall improved, off pressors, episode of Afib overnight now improved, no pain, no complaints, still has chest tube in place with minimal output, tolerating diet 9/12 Repeat dose fibrinolytics yesterday, 100cc chest tube output, respiratory status stable 9/14-chest tube placed  Interim History / Subjective:   Pt feels fatigued, breathing feels about the same Stable overnight Minimal output from chest tube despite fibrinolytics  Objective:  Blood pressure (!) 141/55, pulse 80, temperature 98.3 F (36.8 C), temperature source Oral, resp. rate 16, height '5\' 3"'$  (1.6 m), weight 65.3  kg, SpO2 92 %.        Intake/Output Summary (Last 24 hours) at 10/31/2021 1009 Last data filed at 10/31/2021 0600 Gross per 24 hour  Intake 718.6 ml  Output 720 ml  Net -1.4 ml   Filed Weights    10/29/21 0400 10/30/21 0500 10/31/21 0500  Weight: 63.5 kg 67.2 kg 65.3 kg    General: Elderly, chronically ill-appearing HEENT: Moist oral mucosa Neuro: No neurological focality CV: S1-S2 appreciated PULM: Minimal rhonchi GI: Soft, nontender Extremities: Trace pedal edema Skin: no rashes or lesions   Labs reviewed Potassium 3.6, magnesium 2.0  CXR 9/12 Personally reviewed Slightly increased pleural fluid  CT scan of the chest showing loculated pleural effusion with current chest tube situated anteriorly with loculated fluid building posteriorly  Resolved Problems Shock AKI  Assessment & Plan:   Acute hypoxemic respite failure secondary to large left pleural effusion that loculated -She did have intrapleural fibrinolytics which did help initially however position of chest tube may not be allowing adequate drainage  With significant collection of fluid still persistent We will replace chest tube and administer fibrinolytic  Left lower lobe mass which appears to be pleural-based -IR, biopsy attempt pnce more stable, would likely be in a few weeks  Pneumonia -On empiric antibiotics  Atrial fibrillation with RVR -On amiodarone -Low-dose beta-blockers -Anticoagulation once more stable regarding loculated effusion  Descending colitis -Day 6/9 of Flagyl  Anemia critical illness -Continue to monitor  Mechanical fall secondary to weakness and severe fraility-  CT Head/Cspine negative for AICA/fracture. Plan - PT recommending short term SNF.  Best practice:  Diet: regular 10/26/21 Pain/Anxiety/Delirium protocol (if indicated): x DVT prophylaxis: SQ lovenox. Will need fulll AC asap due to a fib -> ? After chest tube comes out. Needs cards opinion Glucose control: ssi Mobility: bed rest Code Status: DNR but full medical care  Family Communication:  -per primary  Disposition: per primary  LABS    PULMONARY Recent Labs  Lab 10/25/21 1033  PHART 7.5*   PCO2ART 27*  PO2ART 93  HCO3 21.1  O2SAT 99.5    CBC Recent Labs  Lab 10/29/21 0443 10/30/21 0521 10/31/21 0422  HGB 10.7* 11.1* 10.8*  HCT 34.6* 34.6* 33.6*  WBC 11.2* 13.7* 13.0*  PLT 430* 374 370    COAGULATION Recent Labs  Lab 10/25/21 1048  INR 1.5*    CARDIAC  No results for input(s): "TROPONINI" in the last 168 hours. No results for input(s): "PROBNP" in the last 168 hours.   CHEMISTRY Recent Labs  Lab 10/26/21 0422 10/27/21 0433 10/27/21 1021 10/28/21 0440 10/29/21 0443 10/29/21 0525 10/30/21 0521 10/31/21 0422  NA 132*   < > 137 141 138  --  137 134*  K 3.8   < > 3.8 3.6 3.5  --  3.0* 3.6  CL 99   < > 105 109 105  --  102 100  CO2 24   < > '24 27 28  '$ --  30 30  GLUCOSE 167*   < > 144* 120* 128*  --  95 85  BUN 29*   < > 35* 30* 24*  --  16 15  CREATININE 1.04*   < > 1.17* 1.12* 1.04*  --  0.78 0.73  CALCIUM 8.4*   < > 7.9* 8.0* 7.5*  --  7.4* 7.4*  MG 2.0  --   --  1.7  --  1.9 1.4* 2.0  PHOS  --   --   --  2.2*  --  3.4  --  2.6   < > = values in this interval not displayed.   Estimated Creatinine Clearance: 46.8 mL/min (by C-G formula based on SCr of 0.73 mg/dL).   LIVER Recent Labs  Lab 10/25/21 1048 10/26/21 0422 10/27/21 1021 10/28/21 0440  AST 12* 12* 18 31  ALT '8 7 9 15  '$ ALKPHOS 92 112 101 115  BILITOT 0.6 0.7 0.4 0.5  PROT 5.3* 5.3* 4.8* 5.1*  ALBUMIN 2.2* 2.3* 2.0* 2.0*  INR 1.5*  --   --   --      INFECTIOUS Recent Labs  Lab 10/25/21 1350 10/25/21 1725 10/26/21 0422 10/27/21 0433 10/27/21 1021  LATICACIDVEN 1.7 1.6  --   --  2.4*  PROCALCITON  --   --  1.14 0.90 0.83   ENDOCRINE CBG (last 3)  Recent Labs    10/30/21 0737 10/30/21 1623 10/31/21 0854  GLUCAP 84 100* 100*   IMAGING x48h  - image(s) personally visualized  -   highlighted in bold CT CHEST WO CONTRAST  Result Date: 10/30/2021 CLINICAL DATA:  Pleural effusion.  Malignancy suspected. EXAM: CT CHEST WITHOUT CONTRAST TECHNIQUE: Multidetector CT  imaging of the chest was performed following the standard protocol without IV contrast. RADIATION DOSE REDUCTION: This exam was performed according to the departmental dose-optimization program which includes automated exposure control, adjustment of the mA and/or kV according to patient size and/or use of iterative reconstruction technique. COMPARISON:  Chest x-ray 10/30/2021. CT abdomen and pelvis 10/25/2021. CT angiogram chest 10/23/2021 FINDINGS: Cardiovascular: Aorta is normal in size. There are atherosclerotic calcifications of the aorta and coronary arteries. The heart is normal in size. There is no pericardial effusion. Right upper extremity PICC terminates in the SVC. Mediastinum/Nodes: There is an enlarged subcarinal lymph node measuring 1.9 by 3.0 cm. This is similar to prior study. Thyroid gland, trachea, and esophagus demonstrate no significant findings. Difficult to assess for left hilar adenopathy secondary to lack of contrast. Lungs/Pleura: There is a moderate-to-large sized left pleural effusion which is partially loculated. This is only minimally decreased in size. New left pleural drainage catheter is in place in the anterior lower hemithorax. There is no pneumothorax. There is compressive atelectasis of the left upper lobe and left lower lobe. Underlying left lower lobe mass would be difficult to exclude. Faint masslike opacity is seen in the lower posterior left pleural space measuring 3.5 x 7.6 cm image 2/122. There is a small layering right pleural effusion. There is atelectasis of the right lower lobe. Mild emphysematous changes are present. Upper Abdomen: No acute abnormality. Musculoskeletal: Mild compression deformity of T3 appears chronic and unchanged. Vertebroplasty changes at T11 are unchanged. No acute fracture or focal osseous lesion identified. IMPRESSION: 1. Moderate-to-large size loculated left pleural effusion has only minimally decreased in size status post pleural drainage  catheter placement. 2. There continues to be compressive atelectasis of the left lower lobe. An underlying left lower lobe mass can not be excluded. 3. Unchanged pleural-based masslike density in the inferior left hemithorax measuring 3.5 x 7.6 cm. Malignancy not excluded. 4. Stable subcarinal pathologic lymphadenopathy. 5. Small right pleural effusion is new from prior. 6. Aortic Atherosclerosis (ICD10-I70.0) and Emphysema (ICD10-J43.9). Electronically Signed   By: Ronney Asters M.D.   On: 10/30/2021 21:39   DG Chest Port 1 View  Result Date: 10/30/2021 CLINICAL DATA:  Increased shortness of breath this morning EXAM: PORTABLE CHEST 1 VIEW COMPARISON:  Portable exam 0817 hours compared to 10/29/2021  FINDINGS: LEFT thoracostomy tube stable. RIGHT arm PICC line tip projects over SVC. Normal heart size, mediastinal contours, and pulmonary vascularity. Atherosclerotic calcification aorta. Persistent partially loculated moderate-sized LEFT pleural effusion and basilar atelectasis. Mild RIGHT basilar atelectasis as well. No pneumothorax or acute osseous findings. IMPRESSION: Persistent partially loculated LEFT pleural effusion and LEFT lung atelectasis. Subsegmental atelectasis RIGHT base. Electronically Signed   By: Lavonia Dana M.D.   On: 10/30/2021 08:41    Sherrilyn Rist, MD [facility] Pager: See Shea Evans

## 2021-10-31 NOTE — Procedures (Signed)
Insertion of Chest Tube Procedure Note  TAVIONNA GROUT  [id number]  [birth year]  Date:10/31/21  Time:10:06 AM    Provider Performing: Cicero Duck A Jaisha Villacres   Procedure: Chest Tube Insertion (56861)  Indication(s) Effusion  Consent Risks of the procedure as well as the alternatives and risks of each were explained to the patient and/or caregiver.  Consent for the procedure was obtained and is signed in the bedside chart  Anesthesia Topical only with 1% lidocaine    Time Out Verified patient identification, verified procedure, site/side was marked, verified correct patient position, special equipment/implants available, medications/allergies/relevant history reviewed, required imaging and test results available.   Sterile Technique Maximal sterile technique including full sterile barrier drape, hand hygiene, sterile gown, sterile gloves, mask, hair covering, sterile ultrasound probe cover (if used).   Procedure Description Ultrasound used to identify appropriate pleural anatomy for placement and overlying skin marked. Area of placement cleaned and draped in sterile fashion.  A 14 French pigtail pleural catheter was placed into the left pleural space using Seldinger technique. Appropriate return of fluid was obtained.  The tube was connected to atrium and placed on -20 cm H2O wall suction.   Complications/Tolerance None; patient tolerated the procedure well. Chest X-ray is ordered to verify placement.   EBL Minimal  Specimen(s) none

## 2021-10-31 NOTE — Progress Notes (Signed)
Left chest tube removed following placement of new chest tube

## 2021-10-31 NOTE — Progress Notes (Signed)
[facility] Progress Note Patient Name: [name] DOB: February 12, 1935 MRN: [id number]   Date of Service  10/31/2021  HPI/Events of Note  Patient c/o pain from chest tube site that was re-inserted today. Tramadol give at 6:48 PM did not relieve her pain.   eICU Interventions  Plan: Fentanyl 12.5 mcg IV X 1.      Intervention Category Major Interventions: Other:  Lysle Dingwall 10/31/2021, 10:03 PM

## 2021-10-31 NOTE — Procedures (Signed)
Pleural Fibrinolytic Administration Procedure Note  [name]  [id number]  1934-10-15  Date:10/31/21  Time:11:17 AM   Provider Performing:Gavynn Duvall A Kayan Blissett   Procedure: Pleural Fibrinolysis Initial day (29528)  Indication(s) Fibrinolysis of complicated pleural effusion  Consent Risks of the procedure as well as the alternatives and risks of each were explained to the patient and/or caregiver.  Consent for the procedure was obtained.   Anesthesia None   Time Out Verified patient identification, verified procedure, site/side was marked, verified correct patient position, special equipment/implants available, medications/allergies/relevant history reviewed, required imaging and test results available.   Sterile Technique Hand hygiene, gloves   Procedure Description Existing pleural catheter was cleaned and accessed in sterile manner.  '10mg'$  of tPA in 30cc of saline and '5mg'$  of dornase in 30cc of sterile water were injected into pleural space using existing pleural catheter.  Catheter will be clamped for 1 hour and then placed back to suction.   Complications/Tolerance None; patient tolerated the procedure well.  EBL None   Specimen(s) None

## 2021-11-01 ENCOUNTER — Inpatient Hospital Stay (HOSPITAL_COMMUNITY): Payer: Medicare PPO

## 2021-11-01 DIAGNOSIS — J90 Pleural effusion, not elsewhere classified: Secondary | ICD-10-CM | POA: Diagnosis not present

## 2021-11-01 LAB — BASIC METABOLIC PANEL
Anion gap: 7 (ref 5–15)
BUN: 16 mg/dL (ref 8–23)
CO2: 27 mmol/L (ref 22–32)
Calcium: 7.5 mg/dL — ABNORMAL LOW (ref 8.9–10.3)
Chloride: 101 mmol/L (ref 98–111)
Creatinine, Ser: 0.82 mg/dL (ref 0.44–1.00)
GFR, Estimated: >60 mL/min (ref >60)
Glucose, Bld: 64 mg/dL — ABNORMAL LOW (ref 70–99)
Potassium: 3.5 mmol/L (ref 3.5–5.1)
Sodium: 135 mmol/L (ref 135–145)

## 2021-11-01 LAB — CBC WITH DIFFERENTIAL/PLATELET
Abs Immature Granulocytes: 0.65 10*3/uL — ABNORMAL HIGH (ref 0.00–0.07)
Basophils Absolute: 0.1 10*3/uL (ref 0.0–0.1)
Basophils Relative: 1 %
Eosinophils Absolute: 0.1 10*3/uL (ref 0.0–0.5)
Eosinophils Relative: 1 %
HCT: 35 % — ABNORMAL LOW (ref 36.0–46.0)
Hemoglobin: 11.1 g/dL — ABNORMAL LOW (ref 12.0–15.0)
Immature Granulocytes: 5 %
Lymphocytes Relative: 7 %
Lymphs Abs: 0.9 10*3/uL (ref 0.7–4.0)
MCH: 28.2 pg (ref 26.0–34.0)
MCHC: 31.7 g/dL (ref 30.0–36.0)
MCV: 88.8 fL (ref 80.0–100.0)
Monocytes Absolute: 1.6 10*3/uL — ABNORMAL HIGH (ref 0.1–1.0)
Monocytes Relative: 13 %
Neutro Abs: 9.6 10*3/uL — ABNORMAL HIGH (ref 1.7–7.7)
Neutrophils Relative %: 73 %
Platelets: 380 10*3/uL (ref 150–400)
RBC: 3.94 MIL/uL (ref 3.87–5.11)
RDW: 16.3 % — ABNORMAL HIGH (ref 11.5–15.5)
WBC: 12.9 10*3/uL — ABNORMAL HIGH (ref 4.0–10.5)
nRBC: 0 % (ref 0.0–0.2)

## 2021-11-01 LAB — MAGNESIUM: Magnesium: 1.8 mg/dL (ref 1.7–2.4)

## 2021-11-01 LAB — GLUCOSE, CAPILLARY: Glucose-Capillary: 84 mg/dL (ref 70–99)

## 2021-11-01 MED ORDER — LEVOTHYROXINE SODIUM 50 MCG PO TABS
50.0000 ug | ORAL_TABLET | Freq: Every day | ORAL | Status: DC
  Administered 2021-11-01 – 2021-11-03 (×3): 50 ug via ORAL
  Filled 2021-11-01 (×3): qty 1

## 2021-11-01 MED ORDER — POTASSIUM CHLORIDE CRYS ER 20 MEQ PO TBCR
40.0000 meq | EXTENDED_RELEASE_TABLET | Freq: Once | ORAL | Status: AC
  Administered 2021-11-01: 40 meq via ORAL
  Filled 2021-11-01: qty 2

## 2021-11-01 MED ORDER — LACTATED RINGERS IV SOLN: INTRAVENOUS | Status: DC

## 2021-11-01 MED ORDER — MIDODRINE HCL 5 MG PO TABS
10.0000 mg | ORAL_TABLET | Freq: Three times a day (TID) | ORAL | Status: DC
  Administered 2021-11-01 – 2021-11-03 (×6): 10 mg via ORAL
  Filled 2021-11-01 (×6): qty 2

## 2021-11-01 MED ORDER — MAGNESIUM SULFATE 2 GM/50ML IV SOLN
2.0000 g | Freq: Once | INTRAVENOUS | Status: AC
  Administered 2021-11-01: 2 g via INTRAVENOUS
  Filled 2021-11-01: qty 50

## 2021-11-01 MED ORDER — STERILE WATER FOR INJECTION IJ SOLN
5.0000 mg | Freq: Once | RESPIRATORY_TRACT | Status: AC
  Administered 2021-11-01: 5 mg via INTRAPLEURAL
  Filled 2021-11-01: qty 5

## 2021-11-01 MED ORDER — ONDANSETRON HCL 4 MG/2ML IJ SOLN
4.0000 mg | Freq: Three times a day (TID) | INTRAMUSCULAR | Status: DC
  Administered 2021-11-01 – 2021-11-03 (×5): 4 mg via INTRAVENOUS
  Filled 2021-11-01 (×5): qty 2

## 2021-11-01 MED ORDER — BOOST / RESOURCE BREEZE PO LIQD CUSTOM
1.0000 | Freq: Three times a day (TID) | ORAL | Status: DC
  Administered 2021-11-01 – 2021-11-02 (×3): 1 via ORAL

## 2021-11-01 MED ORDER — SODIUM CHLORIDE (PF) 0.9 % IJ SOLN
10.0000 mg | Freq: Once | INTRAMUSCULAR | Status: AC
  Administered 2021-11-01: 10 mg via INTRAPLEURAL
  Filled 2021-11-01: qty 10

## 2021-11-01 MED ORDER — STERILE WATER FOR INJECTION IJ SOLN
5.0000 mg | Freq: Once | RESPIRATORY_TRACT | Status: DC
  Filled 2021-11-01: qty 5

## 2021-11-01 MED ORDER — SODIUM CHLORIDE (PF) 0.9 % IJ SOLN
10.0000 mg | Freq: Once | INTRAMUSCULAR | Status: DC
  Filled 2021-11-01: qty 10

## 2021-11-01 NOTE — Progress Notes (Signed)
[facility] Progress Note Patient Name: [name] DOB: Sep 28, 1934 MRN: [id number]   Date of Service  11/01/2021  HPI/Events of Note  Nursing request for AM lab orders.   eICU Interventions  Will order CBC with platelets, BMP and Mg++ level at 5 AM.     Intervention Category Major Interventions: Other:  Lysle Dingwall 11/01/2021, 3:10 AM

## 2021-11-01 NOTE — Progress Notes (Signed)
Primary nurse gave updates to daughter [name]. Will continue to monitor.

## 2021-11-01 NOTE — Progress Notes (Signed)
Notified provider that the patient is requiring more oxygen. After bed bath patient had trouble keeping sats up. Nurse turned oxygen up to 6 liters until patient could recover. Kenton Kingfisher, NP notified. Patient due to have chest x-ray in morning. No new orders at this time. Will continue to monitor.

## 2021-11-01 NOTE — NC FL2 (Signed)
[place] LEVEL OF CARE SCREENING TOOL     IDENTIFICATION  Patient Name: [name] Birthdate: 1934-04-21 Sex: female Admission Date (Current Location): 10/23/2021  [facility] and [place] Number:  Herbalist and Address:  [facility],  [address] [place], [place]      Provider Number: [id number]  Attending Physician Name and Address:  Geradine Girt, DO  Relative Name and Phone Number:       Current Level of Care: Hospital Recommended Level of Care: [place] Prior Approval Number:    Date Approved/Denied:   PASRR Number: [id number]  Discharge Plan: SNF    Current Diagnoses: Patient Active Problem List   Diagnosis Date Noted   Severe aortic stenosis    Shock ([place])    Pituitary adenoma ([place]) 10/25/2021   Acute respiratory failure with hypoxia ([place]) 10/25/2021   Lung mass 10/25/2021   Hypomagnesemia 10/25/2021   Shock circulatory ([place])    Pleural effusion on left    Paroxysmal atrial fibrillation with RVR ([place]) 10/23/2021   General weakness 02/07/2020   FTT (failure to thrive) in adult 02/07/2020   Dehydration 02/07/2020   Lactic acidosis 02/07/2020   Hypotension 02/07/2020   Dissection of aorta ([place]) 05/13/2016   Aortic valve stenosis 05/13/2016   Aortic atherosclerosis and peripheral vascular disease 05/13/2016   Degenerative arthritis of cervical spine with nerve compression 04/20/2016   Neck pain 03/16/2016   Chest wall pain 03/16/2016   Hypokalemia 03/16/2016   Nausea & vomiting 03/16/2016   Polyarticular pseudogout 01/06/2015   Knee effusion    Pseudogout 02/14/2014   Knee effusion, left 05/17/2013   Fatigue 08/14/2011   Edema 03/19/2011   COPD (chronic obstructive pulmonary disease) ([place]) 02/02/2011   Aortic valve disorder 02/27/2010   Type 2 diabetes mellitus with neurological complications ([place]) 47/82/9562   DYSPNEA 10/29/2009   ALLERGIC RHINITIS 12/24/2008   Hyperlipidemia  12/19/2008   Essential hypertension 12/19/2008   Obstructive sleep apnea 12/19/2008    Orientation RESPIRATION BLADDER Height & Weight     Self, Time, Situation, Place  Normal Continent Weight: 64.1 kg Height:  '5\' 3"'$  (160 cm)  BEHAVIORAL SYMPTOMS/MOOD NEUROLOGICAL BOWEL NUTRITION STATUS      Continent Diet (regular)  AMBULATORY STATUS COMMUNICATION OF NEEDS Skin   Extensive Assist Verbally Normal                       Personal Care Assistance Level of Assistance  Bathing, Feeding, Dressing Bathing Assistance: Limited assistance Feeding assistance: Limited assistance Dressing Assistance: Limited assistance     Functional Limitations Info  Sight, Hearing, Speech Sight Info: Adequate Hearing Info: Adequate Speech Info: Adequate    SPECIAL CARE FACTORS FREQUENCY  PT (By licensed PT), OT (By licensed OT)     PT Frequency: 5 x weekly OT Frequency: 5 x weekly            Contractures Contractures Info: Not present    Additional Factors Info  Code Status Code Status Info: DNR             Current Medications (11/01/2021):  This is the current hospital active medication list Current Facility-Administered Medications  Medication Dose Route Frequency Provider Last Rate Last Admin   0.9 %  sodium chloride infusion  250 mL Intravenous Continuous Brand Males, MD   Stopped at 10/28/21 1602   acetaminophen (TYLENOL) tablet 650 mg  650 mg Oral Q4H PRN Hosie Poisson, MD  amiodarone (PACERONE) tablet 200 mg  200 mg Oral BID Fransico Him R, MD   200 mg at 10/31/21 2144   atorvastatin (LIPITOR) tablet 80 mg  80 mg Oral QHS Edwin Dada, MD   80 mg at 10/31/21 2144   calcium carbonate (TUMS - dosed in mg elemental calcium) chewable tablet 200 mg of elemental calcium  1 tablet Oral Q8H PRN Anders Simmonds, MD   200 mg of elemental calcium at 10/25/21 1005   ceFEPIme (MAXIPIME) 2 g in sodium chloride 0.9 % 100 mL IVPB  2 g Intravenous Q12H Brand Males, MD   Stopped at 11/01/21 0138   Chlorhexidine Gluconate Cloth 2 % PADS 6 each  6 each Topical Daily Hunsucker, Bonna Gains, MD   6 each at 10/31/21 1750   docusate sodium (COLACE) capsule 100 mg  100 mg Oral BID PRN Hunsucker, Bonna Gains, MD   100 mg at 10/25/21 1505   docusate sodium (ENEMEEZ) enema 283 mg  1 enema Rectal Daily PRN Shearon Stalls, Rahul P, PA-C       enoxaparin (LOVENOX) injection 40 mg  40 mg Subcutaneous Q24H Hunsucker, Bonna Gains, MD   40 mg at 10/31/21 1208   feeding supplement (BOOST / RESOURCE BREEZE) liquid 1 Container  1 Container Oral TID BM Vann, Jessica U, DO       iohexol (OMNIPAQUE) 300 MG/ML solution 100 mL  100 mL Intravenous Once PRN Desai, Rahul P, PA-C       lip balm (CARMEX) ointment   Topical PRN Hunsucker, Bonna Gains, MD   1 Application at 24/40/10 2145   metoprolol tartrate (LOPRESSOR) tablet 12.5 mg  12.5 mg Oral BID Fransico Him R, MD   12.5 mg at 10/31/21 2144   metroNIDAZOLE (FLAGYL) IVPB 500 mg  500 mg Intravenous Q12H Brand Males, MD   Stopped at 11/01/21 0616   Oral care mouth rinse  15 mL Mouth Rinse PRN Hunsucker, Bonna Gains, MD       polyethylene glycol (MIRALAX / GLYCOLAX) packet 17 g  17 g Oral Daily PRN Hunsucker, Bonna Gains, MD   17 g at 10/25/21 1505   sodium chloride flush (NS) 0.9 % injection 10 mL  10 mL Intrapleural Q8H Hunsucker, Bonna Gains, MD   10 mL at 10/31/21 2146   sodium chloride flush (NS) 0.9 % injection 10 mL  10 mL Intrapleural Q8H Olalere, Adewale A, MD   10 mL at 11/01/21 0509   sodium chloride flush (NS) 0.9 % injection 10-40 mL  10-40 mL Intracatheter Q12H Brand Males, MD   10 mL at 10/31/21 2146   sodium chloride flush (NS) 0.9 % injection 10-40 mL  10-40 mL Intracatheter PRN Brand Males, MD   10 mL at 10/25/21 2139   traMADol (ULTRAM) tablet 50 mg  50 mg Oral Q6H PRN Hosie Poisson, MD   50 mg at 11/01/21 0515     Discharge Medications: Please see discharge summary for a list of discharge  medications.  Relevant Imaging Results:  Relevant Lab Results:   Additional Information SSN: [id number]  Leeroy Cha, RN

## 2021-11-01 NOTE — Progress Notes (Signed)
Pharmacy: Electrolyte replacement K 3.5 Mag 1.8  Plan: Kdur 40 po x 1 & mag 2 gm IV x 1 per e link protocol  Eudelia Bunch, Pharm.D 11/01/2021 9:49 AM

## 2021-11-01 NOTE — Progress Notes (Signed)
NAME:  [name], MRN:  [id number], DOB:  1934-08-28, LOS: 9 ADMISSION DATE:  10/23/2021 CONSULTATION DATE:  10/23/2021 REFERRING MD:  [place] - EDP CHIEF COMPLAINT:  Hypotension in the setting of Afib with RVR   BRIEF  87 year old woman who presented to [facility] ED 9/6 post-mechanical fall, also with decreased PO intake/weakness x 5 days. Hit posterior head while getting out of the bathroom; no LOC. Not on AC. PMHx significant for HTN, HLD, COPD/OSA, T2DM, fibromuscular dysplasia, pseudogout.  Per chart review, patient had been feeling very weak and sustained a mechanical fall when ambulating back from the restroom. She struck her posterior head on the floor but did not have LOC. Denies recent fever/chills, CP/SOB, n/v/d or abdominal pain. Endorses HA s/p fall. EMS noted mild hypotension in the field, initially corrected after fluid administration.  On ED evaluation, afebrile with HR 100s, mildly tachypneic to 25, BP 129/62, SpO2 91% on RA. Labs notable for WBC/H&H WNL, Plt 572, lytes WNL, Cr 1.03 (baseline 0.8), normal AST/ALT, Tbili 1.3. Serum LDH 213, trop WNL. LA 1.6. COVID/Flu negative. CT Head/Cspine NAICA, no fracture. CXR with large L pleural effusion. CTA Chest negative for PE, large left pleural effusion noted with R mediastinal shift, occluded LLL bronchus, concern for poorly defined LLL mass.   PCCM consulted for chest tube placement/new LLL mass. Patient became hypotensive requiring Neosynephrine initiation and was admitted to ICU.  Pertinent Medical History:   has a past medical history of Allergic rhinitis, Arthritis, Complication of anesthesia, COPD (chronic obstructive pulmonary disease) ([place]), Diabetes mellitus, Fibromuscular dysplasia ([place]), Hyperlipidemia, Hypertension, PONV (postoperative nausea and vomiting), Pseudogout, and Sleep apnea.   has a past surgical history that includes pituitary tumor removed; Back surgery (2005); Tonsillectomy; nasal septal deviation; bilateral  renal bypass; and Esophagogastroduodenoscopy (N/A, 10/01/2012).  Significant Hospital Events:  9/6 - Presented to [facility] ED for mechanical fall with weakness, poor PO intake. CT Chest negative for PE, c/f new LLL mass and large L pleural effusion with R mediastinal shift. CT Head/Cspine negative. Chest tube placed. 9/7 intrapleural lytics administered dose 1 with 750cc out. CT abd lung cut 9/8 with very small residual effusion only 9/8 Circulatory shock with colitis. RUE PICC Placed. Started flagyl IV 10/25/21: approx 222h ago sudden shock with vomitting and abd pain. CREAT up. On levophed CT with Descending COLITIS. -> Flagyl started. On levophed and hydrocor. ECHO with severe AS and EF > 70% and hyperdymanic -> gioven fluids 9/11  Overall improved, off pressors, episode of Afib overnight now improved, no pain, no complaints, still has chest tube in place with minimal output, tolerating diet 9/12 Repeat dose fibrinolytics yesterday, 100cc chest tube output, respiratory status stable 9/13 minimal chest tube output despite lytics, repeat CXR and CT ordered  CT chest moderate-to-large size loculated left pleural effusion has only minimally decreased in size status post pleural drainage catheter placement. 9/14 Decision made to remove original chest tube and replace with new tube in visualized pocket.  9/15 1.4L fluid removed from new chest tube post lytics   Interim History / Subjective:  Seen lying in bed, she is frustrated and disheartened over prolong stay but is encouraged from output from new chest tube  Objective:  Blood pressure (!) 145/50, pulse 69, temperature 97.7 F (36.5 C), resp. rate 12, height '5\' 3"'$  (1.6 m), weight 64.1 kg, SpO2 99 %.        Intake/Output Summary (Last 24 hours) at 11/01/2021 0718 Last data filed at 11/01/2021 0630 Gross per  24 hour  Intake 633.59 ml  Output 2420 ml  Net -1786.41 ml    Filed Weights   10/30/21 0500 10/31/21 0500 11/01/21 0500  Weight: 67.2 kg  65.3 kg 64.1 kg   Physical Exam General: Acute on chronically ill appearing thin deconditioned elderly female lying in bed, in NAD HEENT: \[place]/AT, MM pink/moist, PERRL,  Neuro: Alert and oriented x3, non-focal  CV: s1s2 regular rate and rhythm, no murmur, rubs, or gallops,  PULM:  Clear to ascultation, no added breath sounds, on 4L [place], left chest tube to suction GI: soft, bowel sounds active in all 4 quadrants, non-tender, non-distended, tolerating oral diet  Extremities: warm/dry, no edema  Skin: no rashes or lesions  Assessment & Plan:   Acute hypoxemic respite failure secondary to large left pleural effusion that loculated Hx of OSA Pneumonia  -She received intrapleural fibrinolytics with original chest tube which did help initially however position of chest tube did not allow for adequate drainage. Therefore decision was made to remove original chest tube and exchange for new tube in loculated pocket  P: New chest tube placed 9/14 with additional dose of lytics 1.4L of fluids removed in the last 24hrs Repeat pleural lytics today  Hopefully chest tube can be removed in am  Repeat CXR in am  Routine chest tube care  Mobilize as able  Repeat CXR in am  Remains on empiric antibiotics   New left lower lobe mass which appears to be pleural-based -Repeat chest CT 9/14 with nnchanged pleural-based masslike density in the inferior left hemithorax measuring 3.5 x 7.6 cm. P: Tentative plan for IR to biopsy mass once more stable, likely in the outpatient setting   Best practice:  Per primary   Critical care: NA  Lucilia Yanni D. Kenton Kingfisher, NP-C [facility] Personal contact information can be found on Amion  11/01/2021, 7:37 AM

## 2021-11-01 NOTE — TOC Progression Note (Addendum)
Transition of Care [facility] - Progression Note    Patient Details  Name: NATHA GUIN MRN: [id number] Date of Birth: [birth year]  Transition of Care [facility]) CM/SW Contact  Leeroy Cha, RN Phone Number: 11/01/2021, 7:38 AM  Clinical Narrative:    Pt [facility]. Fl2 [facility] [place] [facility]  Expected Discharge Plan: Home/Self Care Barriers to Discharge: Continued Medical Work up  Expected Discharge Plan and Services Expected Discharge Plan: Home/Self Care   Discharge Planning Services: CM Consult   Living arrangements for the past 2 months: Apartment                                       Social Determinants of Health (SDOH) Interventions    Readmission Risk Interventions   Row Labels 02/08/2020    1:57 PM  Readmission Risk Prevention Plan   Section Header. No data exists in this row.   Post Dischage Appt   Complete  Medication Screening   Complete  Transportation Screening   Complete

## 2021-11-01 NOTE — Progress Notes (Signed)
PROGRESS NOTE    [name]  SFK:[id number] DOB: 12/06/1934 DOA: 10/23/2021 PCP: [name], MD    Chief Complaint  Patient presents with   Fall    Brief Narrative:  87 year old woman who presented to [facility] ED 9/6 post-mechanical fall, also with decreased PO intake/weakness x 5 days  pt reporting hitting  posterior head while getting out of the bathroom, no loss of conscious ness. CT Head/Cspine NAICA, no fracture. CXR with large L pleural effusion. CTA Chest negative for PE, large left pleural effusion noted with R mediastinal shift, occluded LLL bronchus, concern for poorly defined LLL mass. PCCM consulted for chest tube placement/new LLL mass. Patient became hypotensive requiring Neosynephrine initiation and was admitted to ICU. She has been weaned off vasopressors. She currently has chest tube placed on 10/23/21. Repeat CXR shows persistent loculated pleural effusion. Follow up CT of the chest  shows Moderate-to-large size loculated left pleural effusion has only minimally decreased in size status post pleural drainage catheter placement. Existing pleural catheter replaced and fibrinolytic administered  on 10/31/21 by PCCM   Assessment & Plan:   Principal Problem:   Pleural effusion on left Active Problems:   Lung mass   Paroxysmal atrial fibrillation with RVR (HCC)   Pituitary adenoma (HCC)   Hyperlipidemia   Essential hypertension   Obstructive sleep apnea   Type 2 diabetes mellitus with neurological complications (HCC)   COPD (chronic obstructive pulmonary disease) (HCC)   Aortic atherosclerosis and peripheral vascular disease   Hypotension   Acute respiratory failure with hypoxia (HCC)   Hypomagnesemia   Shock circulatory (HCC)   Severe aortic stenosis   Shock ([place])   Acute respiratory failure with hypoxia secondary to a large left pleural effusion.  S/p evacuation with chest tube placement on 10/23/2021, s/p fibrinolytic on 10/24/2021, PCCM repeated the dose  of fibrinolysis on 10/29/21 and 10/31/21 Continue with [place] oxygen to keep sats greater than 90%. Currently requiring between 2 to 3 lit [place] oxygen.  Cytology non diagnostic. PCCM on board and managing chest tube.   New left lower lobe mass 6 cm and pleural based:  IR consulted for a biopsy, recommended repeat CT in 6 weeks before biopsy is attempted.  -empiric antibiotics for a total of 7 days.   Atrial fibrillation with RVR, severe aortic stenosis Rate controlled with amiodarone and metoprolol 12.5 mg BID.  Holding anti coagulation due to use of fibrinolytics.  Restart eliquis when okay from pulm stand point.   Hypertension;  BP parameters are well controlled.  Circulatory shock secondary to colitis Appears to have resolved.  Continue the course of flagyl for colitis.  Off pressors.   AKI Creatinine back to baseline with IV fluids.  Resolved.    Anemia of critical illness:  Transfuse to keep hemoglobin greater than 8.  Hemoglobin is stable  Lactic acidosis:  Resolved.   H/o mechanical fall sec to weakness  Imaging is negative.  Therapy evaluations.    Hypokalemia , Hypomagnesemia and hypophosphatemia.  Replaced.  Disposition:  Therapy eval recommending SNF. [facility].  Daughter wants to have palliative care meeting to discuss goals of care and wants to focus on patient being comfortable .  Palliative care consult placed.      DVT prophylaxis: Lovenox.  Code Status: DNR Family Communication: none at bedside.  Disposition:   Status is: Inpatient Remains inpatient appropriate because: chest tube placement.    Level of care: ICU Consultants:  PCCM CARDIOLOGY.  Palliative care .   Procedures:  Chest tube placement.      Subjective: Does not feel SOB, does have a cough and inability to take a deep breath   Objective: Vitals:   11/01/21 0400 11/01/21 0500 11/01/21 0600 11/01/21 0630  BP: (!) 124/55 (!) 103/48 (!)  145/50   Pulse: 81 78 72 69  Resp: 18 (!) '22 12 12  '$ Temp:  97.7 F (36.5 C)    TempSrc:      SpO2: 96% 93% 97% 99%  Weight:  64.1 kg    Height:        Intake/Output Summary (Last 24 hours) at 11/01/2021 0730 Last data filed at 11/01/2021 0630 Gross per 24 hour  Intake 633.59 ml  Output 2420 ml  Net -1786.41 ml   Filed Weights   10/30/21 0500 10/31/21 0500 11/01/21 0500  Weight: 67.2 kg 65.3 kg 64.1 kg    Examination:   General: Appearance:     Overweight female in no acute distress     Lungs:     Diminished, chest tube in place  Heart:    Normal heart rate.   MS:   All extremities are intact.   Neurologic:   Pleasantly confused (thought it was night). No apparent focal neurological           defect.       Data Reviewed: I have personally reviewed following labs and imaging studies  CBC: Recent Labs  Lab 10/28/21 0440 10/29/21 0443 10/30/21 0521 10/31/21 0422 11/01/21 0458  WBC 11.3* 11.2* 13.7* 13.0* 12.9*  NEUTROABS  --   --   --  9.6* 9.6*  HGB 10.9* 10.7* 11.1* 10.8* 11.1*  HCT 34.8* 34.6* 34.6* 33.6* 35.0*  MCV 88.8 90.1 88.0 88.0 88.8  PLT 461* 430* 374 370 761    Basic Metabolic Panel: Recent Labs  Lab 10/28/21 0440 10/29/21 0443 10/29/21 0525 10/30/21 0521 10/31/21 0422 11/01/21 0458  NA 141 138  --  137 134* 135  K 3.6 3.5  --  3.0* 3.6 3.5  CL 109 105  --  102 100 101  CO2 27 28  --  '30 30 27  '$ GLUCOSE 120* 128*  --  95 85 64*  BUN 30* 24*  --  '16 15 16  '$ CREATININE 1.12* 1.04*  --  0.78 0.73 0.82  CALCIUM 8.0* 7.5*  --  7.4* 7.4* 7.5*  MG 1.7  --  1.9 1.4* 2.0 1.8  PHOS 2.2*  --  3.4  --  2.6  --     GFR: Estimated Creatinine Clearance: 45.2 mL/min (by C-G formula based on SCr of 0.82 mg/dL).  Liver Function Tests: Recent Labs  Lab 10/25/21 1048 10/26/21 0422 10/27/21 1021 10/28/21 0440  AST 12* 12* 18 31  ALT '8 7 9 15  '$ ALKPHOS 92 112 101 115  BILITOT 0.6 0.7 0.4 0.5  PROT 5.3* 5.3* 4.8* 5.1*  ALBUMIN 2.2* 2.3* 2.0* 2.0*     CBG: Recent Labs  Lab 10/30/21 0737 10/30/21 1623 10/31/21 0807 10/31/21 0854 10/31/21 1725  GLUCAP 84 100* 69* 100* 92     Recent Results (from the past 240 hour(s))  Resp Panel by RT-PCR (Flu A&B, Covid) Anterior Nasal Swab     Status: None   Collection Time: 10/23/21  4:21 PM   Specimen: Anterior Nasal Swab  Result Value Ref Range Status   SARS Coronavirus 2 by RT PCR NEGATIVE NEGATIVE Final    Comment: (NOTE) SARS-CoV-2 target nucleic  acids are NOT DETECTED.  The SARS-CoV-2 RNA is generally detectable in upper respiratory specimens during the acute phase of infection. The lowest concentration of SARS-CoV-2 viral copies this assay can detect is 138 copies/mL. A negative result does not preclude SARS-Cov-2 infection and should not be used as the sole basis for treatment or other patient management decisions. A negative result may occur with  improper specimen collection/handling, submission of specimen other than nasopharyngeal swab, presence of viral mutation(s) within the areas targeted by this assay, and inadequate number of viral copies(<138 copies/mL). A negative result must be combined with clinical observations, patient history, and epidemiological information. The expected result is Negative.  Fact Sheet for Patients:  EntrepreneurPulse.com.au  Fact Sheet for Healthcare Providers:  IncredibleEmployment.be  This test is no t yet approved or cleared by the [place] FDA and  has been authorized for detection and/or diagnosis of SARS-CoV-2 by FDA under an Emergency Use Authorization (EUA). This EUA will remain  in effect (meaning this test can be used) for the duration of the COVID-19 declaration under Section 564(b)(1) of the Act, 21 U.S.C.section 360bbb-3(b)(1), unless the authorization is terminated  or revoked sooner.       Influenza A by PCR NEGATIVE NEGATIVE Final   Influenza B by PCR NEGATIVE NEGATIVE Final     Comment: (NOTE) The Xpert Xpress SARS-CoV-2/FLU/RSV plus assay is intended as an aid in the diagnosis of influenza from Nasopharyngeal swab specimens and should not be used as a sole basis for treatment. Nasal washings and aspirates are unacceptable for Xpert Xpress SARS-CoV-2/FLU/RSV testing.  Fact Sheet for Patients: EntrepreneurPulse.com.au  Fact Sheet for Healthcare Providers: IncredibleEmployment.be  This test is not yet approved or cleared by the [place] FDA and has been authorized for detection and/or diagnosis of SARS-CoV-2 by FDA under an Emergency Use Authorization (EUA). This EUA will remain in effect (meaning this test can be used) for the duration of the COVID-19 declaration under Section 564(b)(1) of the Act, 21 U.S.C. section 360bbb-3(b)(1), unless the authorization is terminated or revoked.  Performed at [facility], [place] [address], [place], [place] [address]   Body fluid culture w Gram Stain     Status: None   Collection Time: 10/23/21  7:19 PM   Specimen: Pleura; Body Fluid  Result Value Ref Range Status   Specimen Description   Final    PLEURAL Performed at [place] [address], [place], [place] [address]    Special Requests   Final    NONE Performed at [facility], [place] [address] [place], [place] [address]    Gram Stain   Final    NO ORGANISMS SEEN RARE WBC PRESENT, PREDOMINANTLY PMN    Culture   Final    NO GROWTH 3 DAYS Performed at [place] [address], [place],  [address]    Report Status 10/27/2021 FINAL  Final  MRSA Next Gen by PCR, Nasal     Status: None   Collection Time: 10/23/21  9:53 PM   Specimen: Nasal Mucosa; Nasal Swab  Result Value Ref Range Status   MRSA by PCR Next Gen NOT DETECTED NOT DETECTED Final    Comment: (NOTE) The GeneXpert MRSA Assay (FDA approved for NASAL specimens only), is one  component of a comprehensive MRSA colonization surveillance program. It is not intended to diagnose MRSA infection nor to guide or monitor treatment for MRSA infections. Test performance is not FDA approved in patients  less than 58 years old. Performed at [facility], [place] [address] [place], [place] [address]          Radiology Studies: [facility] Chest Port 1 View  Result Date: 10/31/2021 CLINICAL DATA:  Chest tube in place. EXAM: PORTABLE CHEST 1 VIEW COMPARISON:  October 30, 2021. FINDINGS: Stable cardiomediastinal silhouette. Right-sided PICC line is in good position. Continued presence of left basilar chest tube. Stable loculated left pleural effusion is noted in the left upper lobe region. Stable left pleural effusion is noted with probable associated atelectasis. Bony thorax is unremarkable. IMPRESSION: Stable position of left-sided chest tube with associated left pleural effusion and associated atelectasis. Continued presence of loculated pleural effusion seen in left upper lobe. Electronically Signed   By: Marijo Conception M.D.   On: 10/31/2021 13:07   CT CHEST WO CONTRAST  Result Date: 10/30/2021 CLINICAL DATA:  Pleural effusion.  Malignancy suspected. EXAM: CT CHEST WITHOUT CONTRAST TECHNIQUE: Multidetector CT imaging of the chest was performed following the standard protocol without IV contrast. RADIATION DOSE REDUCTION: This exam was performed according to the departmental dose-optimization program which includes automated exposure control, adjustment of the mA and/or kV according to patient size and/or use of iterative reconstruction technique. COMPARISON:  Chest x-ray 10/30/2021. CT abdomen and pelvis 10/25/2021. CT angiogram chest 10/23/2021 FINDINGS: Cardiovascular: Aorta is normal in size. There are atherosclerotic calcifications of the aorta and coronary arteries. The heart is normal in size. There is no pericardial effusion. Right upper extremity PICC terminates  in the SVC. Mediastinum/Nodes: There is an enlarged subcarinal lymph node measuring 1.9 by 3.0 cm. This is similar to prior study. Thyroid gland, trachea, and esophagus demonstrate no significant findings. Difficult to assess for left hilar adenopathy secondary to lack of contrast. Lungs/Pleura: There is a moderate-to-large sized left pleural effusion which is partially loculated. This is only minimally decreased in size. New left pleural drainage catheter is in place in the anterior lower hemithorax. There is no pneumothorax. There is compressive atelectasis of the left upper lobe and left lower lobe. Underlying left lower lobe mass would be difficult to exclude. Faint masslike opacity is seen in the lower posterior left pleural space measuring 3.5 x 7.6 cm image 2/122. There is a small layering right pleural effusion. There is atelectasis of the right lower lobe. Mild emphysematous changes are present. Upper Abdomen: No acute abnormality. Musculoskeletal: Mild compression deformity of T3 appears chronic and unchanged. Vertebroplasty changes at T11 are unchanged. No acute fracture or focal osseous lesion identified. IMPRESSION: 1. Moderate-to-large size loculated left pleural effusion has only minimally decreased in size status post pleural drainage catheter placement. 2. There continues to be compressive atelectasis of the left lower lobe. An underlying left lower lobe mass can not be excluded. 3. Unchanged pleural-based masslike density in the inferior left hemithorax measuring 3.5 x 7.6 cm. Malignancy not excluded. 4. Stable subcarinal pathologic lymphadenopathy. 5. Small right pleural effusion is new from prior. 6. Aortic Atherosclerosis (ICD10-I70.0) and Emphysema (ICD10-J43.9). Electronically Signed   By: Ronney Asters M.D.   On: 10/30/2021 21:39   DG Chest Port 1 View  Result Date: 10/30/2021 CLINICAL DATA:  Increased shortness of breath this morning EXAM: PORTABLE CHEST 1 VIEW COMPARISON:  Portable exam  0817 hours compared to 10/29/2021 FINDINGS: LEFT thoracostomy tube stable. RIGHT arm PICC line tip projects over SVC. Normal heart size, mediastinal contours, and pulmonary vascularity. Atherosclerotic calcification aorta. Persistent partially loculated moderate-sized LEFT pleural effusion and basilar atelectasis. Mild RIGHT  basilar atelectasis as well. No pneumothorax or acute osseous findings. IMPRESSION: Persistent partially loculated LEFT pleural effusion and LEFT lung atelectasis. Subsegmental atelectasis RIGHT base. Electronically Signed   By: Lavonia Dana M.D.   On: 10/30/2021 08:41        Scheduled Meds:  amiodarone  200 mg Oral BID   atorvastatin  80 mg Oral QHS   Chlorhexidine Gluconate Cloth  6 each Topical Daily   enoxaparin (LOVENOX) injection  40 mg Subcutaneous Q24H   feeding supplement  1 Container Oral TID BM   metoprolol tartrate  12.5 mg Oral BID   sodium chloride flush  10 mL Intrapleural Q8H   sodium chloride flush  10 mL Intrapleural Q8H   sodium chloride flush  10-40 mL Intracatheter Q12H   Continuous Infusions:  sodium chloride Stopped (10/28/21 1602)   ceFEPime (MAXIPIME) IV Stopped (11/01/21 0138)   metronidazole Stopped (11/01/21 8841)     LOS: 9 days        Geradine Girt, DO [facility]   To contact the attending provider between 7A-7P or the covering provider during after hours 7P-7A, please log into the web site [email or web address] and access using universal [place] password for that web site. If you do not have the password, please call the hospital operator.  11/01/2021, 7:30 AM

## 2021-11-02 ENCOUNTER — Inpatient Hospital Stay (HOSPITAL_COMMUNITY): Payer: Medicare PPO

## 2021-11-02 DIAGNOSIS — R918 Other nonspecific abnormal finding of lung field: Secondary | ICD-10-CM | POA: Diagnosis not present

## 2021-11-02 DIAGNOSIS — J90 Pleural effusion, not elsewhere classified: Secondary | ICD-10-CM | POA: Diagnosis not present

## 2021-11-02 DIAGNOSIS — J9601 Acute respiratory failure with hypoxia: Secondary | ICD-10-CM | POA: Diagnosis not present

## 2021-11-02 LAB — BASIC METABOLIC PANEL
Anion gap: 4 — ABNORMAL LOW (ref 5–15)
BUN: 18 mg/dL (ref 8–23)
CO2: 28 mmol/L (ref 22–32)
Calcium: 7.6 mg/dL — ABNORMAL LOW (ref 8.9–10.3)
Chloride: 104 mmol/L (ref 98–111)
Creatinine, Ser: 0.96 mg/dL (ref 0.44–1.00)
GFR, Estimated: 58 mL/min — ABNORMAL LOW (ref >60)
Glucose, Bld: 91 mg/dL (ref 70–99)
Potassium: 4.2 mmol/L (ref 3.5–5.1)
Sodium: 136 mmol/L (ref 135–145)

## 2021-11-02 LAB — CBC
HCT: 30.8 % — ABNORMAL LOW (ref 36.0–46.0)
Hemoglobin: 9.8 g/dL — ABNORMAL LOW (ref 12.0–15.0)
MCH: 28.7 pg (ref 26.0–34.0)
MCHC: 31.8 g/dL (ref 30.0–36.0)
MCV: 90.3 fL (ref 80.0–100.0)
Platelets: 376 10*3/uL (ref 150–400)
RBC: 3.41 MIL/uL — ABNORMAL LOW (ref 3.87–5.11)
RDW: 16.5 % — ABNORMAL HIGH (ref 11.5–15.5)
WBC: 13 10*3/uL — ABNORMAL HIGH (ref 4.0–10.5)
nRBC: 0 % (ref 0.0–0.2)

## 2021-11-02 LAB — GLUCOSE, CAPILLARY
Glucose-Capillary: 116 mg/dL — ABNORMAL HIGH (ref 70–99)
Glucose-Capillary: 118 mg/dL — ABNORMAL HIGH (ref 70–99)
Glucose-Capillary: 84 mg/dL (ref 70–99)

## 2021-11-02 MED ORDER — FENTANYL CITRATE PF 50 MCG/ML IJ SOSY
12.5000 ug | PREFILLED_SYRINGE | Freq: Once | INTRAMUSCULAR | Status: AC
  Administered 2021-11-02: 12.5 ug via INTRAVENOUS

## 2021-11-02 MED ORDER — PROCHLORPERAZINE EDISYLATE 10 MG/2ML IJ SOLN: 10.0000 mg | Freq: Once | INTRAMUSCULAR | Status: AC

## 2021-11-02 MED ORDER — PROCHLORPERAZINE EDISYLATE 10 MG/2ML IJ SOLN
INTRAMUSCULAR | Status: AC
  Administered 2021-11-02: 10 mg via INTRAVENOUS
  Filled 2021-11-02: qty 2

## 2021-11-02 MED ORDER — FENTANYL CITRATE PF 50 MCG/ML IJ SOSY
PREFILLED_SYRINGE | INTRAMUSCULAR | Status: AC
  Filled 2021-11-02: qty 1

## 2021-11-02 MED ORDER — PROCHLORPERAZINE EDISYLATE 10 MG/2ML IJ SOLN
10.0000 mg | Freq: Four times a day (QID) | INTRAMUSCULAR | Status: DC | PRN
  Administered 2021-11-03: 10 mg via INTRAVENOUS
  Filled 2021-11-02 (×2): qty 2

## 2021-11-02 NOTE — Progress Notes (Signed)
PROGRESS NOTE [name]  GUR:[id number] DOB: 1934/08/03 DOA: 10/23/2021 PCP: [name], MD   Brief Narrative/Hospital Course: 87 y.o.f with COPD not on home O2, OSA on CPAP, HTN, DM, HLD, hx aortic atherosclerosis, possible chronic descending aortic dissection, and hx renal artery bypass, hx pituitary adenoma s/p resection 2004, 2009, and 2010, hx CPPD, OA and chronic low back pain not on daily opiates who presented with few days weakness then a fall.EMS found her hypotensive. In the ER, CXR showed new large left effusion, CTA chest confirmed large left-sided effusion with consolidative mass.  Patient developed Afib with RVR. 9/6: Admitted to ICU on neo, amio, chest tube placed PCCM consulted for chest tube placement/new LLL mass. Patient became hypotensive requiring Neosynephrine initiation and was admitted to ICU.She has been weaned off vasopressors. She currently has chest tube placed on 10/23/21. Repeat CXR shows persistent loculated pleural effusion. Follow up CT of the chest  shows Moderate-to-large size loculated left pleural effusion has only minimally decreased in size status post pleural drainage catheter placement. Existing pleural catheter replaced and fibrinolytic administered  on 10/31/21 by PCCM.  9/16: Patient needing more oxygen in the afternoon 9/15, overnight afebrile BP 90s to low 100.  Labs stable BMP WBC 13, hemoglobin 9.8gm.    Subjective: Seen and examined this morning Alert awake on nasal cannula oxygen 4L. Overnight afebrile. Blood sugar has been on lower side 90s   Assessment and Plan: Principal Problem:   Pleural effusion on left Active Problems:   Lung mass   Paroxysmal atrial fibrillation with RVR (HCC)   Pituitary adenoma (HCC)   Hyperlipidemia   Essential hypertension   Obstructive sleep apnea   Type 2 diabetes mellitus with neurological complications (HCC)   COPD (chronic obstructive pulmonary disease) (HCC)   Aortic atherosclerosis and  peripheral vascular disease   Hypotension   Acute respiratory failure with hypoxia (HCC)   Hypomagnesemia   Shock circulatory (HCC)   Severe aortic stenosis   Shock ([place])  Large left pleural effusion loculated Acute hypoxic respiratory failure 2/2 above: Pigtail catheter x2, interpleural paralytics> on chest tube since 9/14 and S/P lytics #2. Continue supplemental oxygen on 4 L, continue programmers/incentive stroke pulmonary 3, encourage mobility, chest x-ray reviewed, pulmonary managing and discussed this morning. S/p cefepime/Flagyl x 7 days.  Left lower lobe mass-acute pleural-based: Cytology non diagnostic. IR recommended repeat CT in 6 weeks before biopsy is attempted. bu Pulm recommends IR biopsy once pigtail is removed   PAF W/ with RVR Severe aortic stenosis: Rate controlled on amiodarone, metoprolol, continue with holding parameters. Holding anti coagulation due to use of fibrinolytics> resume once acute pulmonary  Hypertension: BP stable Shock due to colitis Lactic acidosis: Needing pressors. Lactate stable.Now off BP stable.  On midodrine  Colitis s/p Flagyl  Hypoglycemia: Noted a day ago.  Encourage oral intake, check CBG every 6 hours Recent Labs  Lab 10/30/21 1623 10/31/21 0807 10/31/21 0854 10/31/21 1725 11/01/21 0821  GLUCAP 100* 69* 100* 92 84    AKI resolved Anemia of critical illness keep Hb more than 8.  Monitor Recent Labs  Lab 10/29/21 0443 10/30/21 0521 10/31/21 0422 11/01/21 0458 11/02/21 0415  HGB 10.7* 11.1* 10.8* 11.1* 9.8*  HCT 34.6* 34.6* 33.6* 35.0* 30.8*    H/o mechanical fall sec to weakness:Imaging is negative.  PTOT as able   Hypokalemia , Hypomagnesemia and hypophosphatemia: Stable  DVT prophylaxis: enoxaparin (LOVENOX) injection 40 mg Start: 10/25/21 1000 SCDs Start: 10/23/21 1951 Code Status:  Code Status: DNR Family Communication: plan of care discussed with patient at bedside.  No family at bedside Patient status is:  Inpatient because of hypoxia chest tube management Level of care: ICU .  Patient at risk of decompensation continue to monitor in ICU  Dispo: The patient is from: Home, lives alone, daughter in [place]            Anticipated disposition: TBD  Mobility Assessment (last 72 hours)     Mobility Assessment     Row Name 11/01/21 2000           What is the highest level of mobility based on the progressive mobility assessment? Level 2 ([place]) - Balance while sitting on edge of bed and cannot stand       Is the above level different from baseline mobility prior to current illness? Yes - Recommend PT order                 Objective: Vitals last 24 hrs: Vitals:   11/02/21 0600 11/02/21 0700 11/02/21 0800 11/02/21 0900  BP: (!) 106/42 (!) 112/34 (!) 113/42 (!) 121/40  Pulse: 72 66 70 65  Resp: '20 11 15 10  '$ Temp:  97.8 F (36.6 C)    TempSrc:  Oral    SpO2: 95% 97% 95% 97%  Weight:      Height:       Weight change:   Physical Examination: General exam: alert awake,older than stated age, weak appearing. HEENT:Oral mucosa moist, Ear/Nose WNL grossly, dentition normal. Respiratory system: bilaterally diminished BS, CHEST TUBE + on left, no use of accessory muscle Cardiovascular system: S1 & S2 +, No JVD. Gastrointestinal system: Abdomen soft,NT,ND, BS+ Nervous System:Alert, awake, moving extremities and grossly nonfocal Extremities: LE edema NEG,distal peripheral pulses palpable.  Skin: No rashes,no icterus. MSK: Normal muscle bulk,tone, power Rue PICC+ LEFT SIDED CHEST TUBE++  Medications reviewed:  Scheduled Meds:  amiodarone  200 mg Oral BID   atorvastatin  80 mg Oral QHS   Chlorhexidine Gluconate Cloth  6 each Topical Daily   enoxaparin (LOVENOX) injection  40 mg Subcutaneous Q24H   feeding supplement  1 Container Oral TID BM   levothyroxine  50 mcg Oral QAC breakfast   metoprolol tartrate  12.5 mg Oral BID   midodrine  10 mg Oral TID WC   ondansetron (ZOFRAN)  IV  4 mg Intravenous Q8H   sodium chloride flush  10 mL Intrapleural Q8H   sodium chloride flush  10 mL Intrapleural Q8H   sodium chloride flush  10-40 mL Intracatheter Q12H   Continuous Infusions:  sodium chloride Stopped (10/28/21 1602)   metronidazole Stopped (11/02/21 0515)    Diet Order             Diet regular Room service appropriate? Yes; Fluid consistency: Thin  Diet effective now                  Intake/Output Summary (Last 24 hours) at 11/02/2021 0920 Last data filed at 11/02/2021 0912 Gross per 24 hour  Intake 1886.69 ml  Output 1115 ml  Net 771.69 ml   Net IO Since Admission: -2,476.45 mL [11/02/21 0920]  Wt Readings from Last 3 Encounters:  11/01/21 64.1 kg  02/23/20 66.7 kg  02/21/20 66.7 kg     Unresulted Labs (From admission, onward)    None     Data Reviewed: I have personally reviewed following labs and imaging studies CBC: Recent Labs  Lab 10/29/21 0443 10/30/21 4665  10/31/21 0422 11/01/21 0458 11/02/21 0415  WBC 11.2* 13.7* 13.0* 12.9* 13.0*  NEUTROABS  --   --  9.6* 9.6*  --   HGB 10.7* 11.1* 10.8* 11.1* 9.8*  HCT 34.6* 34.6* 33.6* 35.0* 30.8*  MCV 90.1 88.0 88.0 88.8 90.3  PLT 430* 374 370 380 035   Basic Metabolic Panel: Recent Labs  Lab 10/28/21 0440 10/29/21 0443 10/29/21 0525 10/30/21 0521 10/31/21 0422 11/01/21 0458 11/02/21 0415  NA 141 138  --  137 134* 135 136  K 3.6 3.5  --  3.0* 3.6 3.5 4.2  CL 109 105  --  102 100 101 104  CO2 27 28  --  '30 30 27 28  '$ GLUCOSE 120* 128*  --  95 85 64* 91  BUN 30* 24*  --  '16 15 16 18  '$ CREATININE 1.12* 1.04*  --  0.78 0.73 0.82 0.96  CALCIUM 8.0* 7.5*  --  7.4* 7.4* 7.5* 7.6*  MG 1.7  --  1.9 1.4* 2.0 1.8  --   PHOS 2.2*  --  3.4  --  2.6  --   --    GFR: Estimated Creatinine Clearance: 38.6 mL/min (by C-G formula based on SCr of 0.96 mg/dL). Liver Function Tests: Recent Labs  Lab 10/27/21 1021 10/28/21 0440  AST 18 31  ALT 9 15  ALKPHOS 101 115  BILITOT 0.4 0.5  PROT  4.8* 5.1*  ALBUMIN 2.0* 2.0*   Recent Labs  Lab 10/30/21 1623 10/31/21 0807 10/31/21 0854 10/31/21 1725 11/01/21 0821  GLUCAP 100* 69* 100* 92 84   Antimicrobials: Anti-infectives (From admission, onward)    Start     Dose/Rate Route Frequency Ordered Stop   10/26/21 1300  ceFEPIme (MAXIPIME) 2 g in sodium chloride 0.9 % 100 mL IVPB        2 g 200 mL/hr over 30 Minutes Intravenous Every 12 hours 10/26/21 1211 11/02/21 0109   10/25/21 1700  metroNIDAZOLE (FLAGYL) IVPB 500 mg        500 mg 100 mL/hr over 60 Minutes Intravenous Every 12 hours 10/25/21 1636 11/02/21 2359      Culture/Microbiology    Component Value Date/Time   SDES  10/23/2021 1919    PLEURAL Performed at [facility], [place] [address] [place], [place] [address]    [place]  10/23/2021 1919    NONE Performed at [facility], [place] [address], [place], [place] [address]    CULT  10/23/2021 1919    NO GROWTH 3 DAYS Performed at [place] [address] [place], [place] [address]    REPTSTATUS 10/27/2021 FINAL 10/23/2021 1919    Other culture-see note  Radiology Studies: DG CHEST PORT 1 VIEW  Result Date: 11/02/2021 CLINICAL DATA:  Respiratory failure. EXAM: PORTABLE CHEST 1 VIEW COMPARISON:  11/01/2021 FINDINGS: Right arm PICC line tip is stable at the level of the cavoatrial junction. Left basilar chest tube is again noted. No significant pneumothorax visualized. Lung volumes are low. Bilateral pleural effusions are again noted and appear unchanged from the previous exam. Atelectasis within both lung bases. IMPRESSION: 1. Stable support apparatus. 2. No change in aeration to the lungs compared with previous exam. 3. Stable bilateral pleural effusions and bibasilar atelectasis. Electronically Signed   By: Kerby Moors M.D.   On: 11/02/2021 09:00   DG Chest Port 1 View  Result Date: 11/01/2021 CLINICAL DATA:  Respiratory failure and left chest tube for  pleural effusion. EXAM: PORTABLE  CHEST 1 VIEW COMPARISON:  10/31/2021 chest x-ray and CT of the chest on 10/30/2021 FINDINGS: Left basilar pigtail chest tube remains in place with interval significant reduction in pleural fluid volume. Resultant improved aeration of the left lung with persistent opacity in the left hilar region and left lower lung. Underlying mass again cannot be excluded. Stable small right pleural effusion. Right upper extremity PICC line tip in lower SVC. No pneumothorax. IMPRESSION: Significant reduction in left pleural fluid volume with resultant improved aeration of the left lung. Persistent opacity in the left hilar region and left lower lung. Underlying mass again cannot be excluded. Electronically Signed   By: Aletta Edouard M.D.   On: 11/01/2021 08:07   DG Chest Port 1 View  Result Date: 10/31/2021 CLINICAL DATA:  Chest tube in place. EXAM: PORTABLE CHEST 1 VIEW COMPARISON:  October 30, 2021. FINDINGS: Stable cardiomediastinal silhouette. Right-sided PICC line is in good position. Continued presence of left basilar chest tube. Stable loculated left pleural effusion is noted in the left upper lobe region. Stable left pleural effusion is noted with probable associated atelectasis. Bony thorax is unremarkable. IMPRESSION: Stable position of left-sided chest tube with associated left pleural effusion and associated atelectasis. Continued presence of loculated pleural effusion seen in left upper lobe. Electronically Signed   By: Marijo Conception M.D.   On: 10/31/2021 13:07     LOS: 10 days   Antonieta Pert, MD [facility]  11/02/2021, 9:20 AM

## 2021-11-02 NOTE — Progress Notes (Signed)
NAME:  [name], MRN:  [id number], DOB:  1934/05/31, LOS: 90 ADMISSION DATE:  10/23/2021 CONSULTATION DATE:  10/23/2021 REFERRING MD:  [place] - EDP CHIEF COMPLAINT:  Hypotension in the setting of Afib with RVR   BRIEF  87 year old woman who presented to [facility] ED 9/6 post-mechanical fall, also with decreased PO intake/weakness x 5 days. Hit posterior head while getting out of the bathroom; no LOC. Not on AC. PMHx significant for HTN, HLD, COPD/OSA, T2DM, fibromuscular dysplasia, pseudogout.  Per chart review, patient had been feeling very weak and sustained a mechanical fall when ambulating back from the restroom. She struck her posterior head on the floor but did not have LOC. Denies recent fever/chills, CP/SOB, n/v/d or abdominal pain. Endorses HA s/p fall. EMS noted mild hypotension in the field, initially corrected after fluid administration.  On ED evaluation, afebrile with HR 100s, mildly tachypneic to 25, BP 129/62, SpO2 91% on RA. Labs notable for WBC/H&H WNL, Plt 572, lytes WNL, Cr 1.03 (baseline 0.8), normal AST/ALT, Tbili 1.3. Serum LDH 213, trop WNL. LA 1.6. COVID/Flu negative. CT Head/Cspine NAICA, no fracture. CXR with large L pleural effusion. CTA Chest negative for PE, large left pleural effusion noted with R mediastinal shift, occluded LLL bronchus, concern for poorly defined LLL mass.   PCCM consulted for chest tube placement/new LLL mass. Patient became hypotensive requiring Neosynephrine initiation and was admitted to ICU.  Pertinent Medical History:   has a past medical history of Allergic rhinitis, Arthritis, Complication of anesthesia, COPD (chronic obstructive pulmonary disease) ([place]), Diabetes mellitus, Fibromuscular dysplasia ([place]), Hyperlipidemia, Hypertension, PONV (postoperative nausea and vomiting), Pseudogout, and Sleep apnea.   has a past surgical history that includes pituitary tumor removed; Back surgery (2005); Tonsillectomy; nasal septal deviation; bilateral  renal bypass; and Esophagogastroduodenoscopy (N/A, 10/01/2012).  Significant Hospital Events:  9/6 - Presented to [facility] ED for mechanical fall with weakness, poor PO intake. CT Chest negative for PE, c/f new LLL mass and large L pleural effusion with R mediastinal shift. CT Head/Cspine negative. Chest tube placed. 9/7 intrapleural lytics administered dose 1 with 750cc out. CT abd lung cut 9/8 with very small residual effusion only 9/8 Circulatory shock with colitis. RUE PICC Placed. Started flagyl IV 10/25/21: approx 222h ago sudden shock with vomitting and abd pain. CREAT up. On levophed CT with Descending COLITIS. -> Flagyl started. On levophed and hydrocor. ECHO with severe AS and EF > 70% and hyperdymanic -> gioven fluids 9/11  Overall improved, off pressors, episode of Afib overnight now improved, no pain, no complaints, still has chest tube in place with minimal output, tolerating diet 9/12 Repeat dose fibrinolytics yesterday, 100cc chest tube output, respiratory status stable 9/13 minimal chest tube output despite lytics, repeat CXR and CT ordered  CT chest moderate-to-large size loculated left pleural effusion has only minimally decreased in size status post pleural drainage catheter placement. 9/14 removed original chest tube and replace with new tube in visualized pocket. lytics#1 9/15 1.4L fluid removed from new chest tube post  9/15 lytics #2  Interim History / Subjective:   Afebrile. Transiently required higher oxygen overnight, now on 4 L No chest pain or dyspnea. Chest tube drained 740 cc, pleural VAC  Objective:  Blood pressure (!) 113/42, pulse 70, temperature 97.8 F (36.6 C), temperature source Oral, resp. rate 15, height '5\' 3"'$  (1.6 m), weight 64.1 kg, SpO2 95 %.        Intake/Output Summary (Last 24 hours) at 11/02/2021 0841 Last data filed at  11/02/2021 0630 Gross per 24 hour  Intake 1747.63 ml  Output 1115 ml  Net 632.63 ml    Filed Weights   10/30/21 0500  10/31/21 0500 11/01/21 0500  Weight: 67.2 kg 65.3 kg 64.1 kg   Physical Exam General: Acute on chronically ill appearing thin deconditioned elderly female lying in bed, in NAD HEENT: \[place]/AT, MM pink/moist, PERRL,  Neuro: Alert and oriented x3, non-focal  CV: S1-S2 regular PULM: Decreased breath sounds on left, no accessory muscle use, sanguinous drainage in the left chest tube, no air leak  GI: soft, bowel sounds active in all 4 quadrants, non-tender, non-distended,  Extremities: warm/dry, no edema  Skin: no rashes or lesions  Labs show slight drop in hemoglobin to 9.8, stable leukocytosis, normal electrolytes.  Chest x-ray shows left pigtail in position, small left effusion persists  Assessment & Plan:   Acute hypoxemic respite failure secondary to large left pleural effusion that loculated Hx of OSA Pneumonia  -s/p pigtail x 2 &  intrapleural fibrinolytics  New chest tube placed 9/14 s/p lytics #2 P: Allow more fluid to drain and discontinue pigtail once drainage decreases Routine chest tube care with flush Mobilize as able  Repeat CXR in am  Remains on empiric cefepime, plan 7 days  New left lower lobe mass which appears to be pleural-based -Repeat chest CT 9/14 with nnchanged pleural-based masslike density in the inferior left hemithorax measuring 3.5 x 7.6 cm. P: Tentative plan for IR to biopsy mass once pigtail is removed  Best practice:  Per primary     Kara Mead MD. FCCP. [facility] Pager : [phone]  If no response to pager , please call [phone] until 7 pm After 7:00 pm call Elink  [phone]    11/02/2021, 8:41 AM

## 2021-11-02 NOTE — Progress Notes (Signed)
[place] Progress Note Patient Name: [name] DOB: 1934/09/23 MRN: [id number]   Date of Service  11/02/2021  HPI/Events of Note  Multiple issues: 1. Nausea - Scheduled Zofran not effective. 2. Pain - Nursing request for pain medication. Not able to take Ultram PO. 3. Nursing request for palliative care consult - Patient is [facility] primary. Defer palliative care consult to primary service.   eICU Interventions  Plan: Compazine 10 mg IV X 1. Fentanyl 12.5 mcg IV X 1. Defer further pain and nausea management to TRH.      Intervention Category Major Interventions: Other:  Lysle Dingwall 11/02/2021, 8:21 PM

## 2021-11-03 DIAGNOSIS — J90 Pleural effusion, not elsewhere classified: Secondary | ICD-10-CM | POA: Diagnosis not present

## 2021-11-03 LAB — BASIC METABOLIC PANEL
Anion gap: 4 — ABNORMAL LOW (ref 5–15)
BUN: 18 mg/dL (ref 8–23)
CO2: 27 mmol/L (ref 22–32)
Calcium: 7.6 mg/dL — ABNORMAL LOW (ref 8.9–10.3)
Chloride: 104 mmol/L (ref 98–111)
Creatinine, Ser: 1.16 mg/dL — ABNORMAL HIGH (ref 0.44–1.00)
GFR, Estimated: 46 mL/min — ABNORMAL LOW (ref >60)
Glucose, Bld: 89 mg/dL (ref 70–99)
Potassium: 4.2 mmol/L (ref 3.5–5.1)
Sodium: 135 mmol/L (ref 135–145)

## 2021-11-03 LAB — GLUCOSE, CAPILLARY
Glucose-Capillary: 82 mg/dL (ref 70–99)
Glucose-Capillary: 111 mg/dL — ABNORMAL HIGH (ref 70–99)

## 2021-11-03 LAB — CBC
HCT: 29.3 % — ABNORMAL LOW (ref 36.0–46.0)
Hemoglobin: 9.1 g/dL — ABNORMAL LOW (ref 12.0–15.0)
MCH: 28 pg (ref 26.0–34.0)
MCHC: 31.1 g/dL (ref 30.0–36.0)
MCV: 90.2 fL (ref 80.0–100.0)
Platelets: 369 10*3/uL (ref 150–400)
RBC: 3.25 MIL/uL — ABNORMAL LOW (ref 3.87–5.11)
RDW: 16.3 % — ABNORMAL HIGH (ref 11.5–15.5)
WBC: 14.2 10*3/uL — ABNORMAL HIGH (ref 4.0–10.5)
nRBC: 0 % (ref 0.0–0.2)

## 2021-11-03 MED ORDER — FENTANYL CITRATE PF 50 MCG/ML IJ SOSY
12.5000 ug | PREFILLED_SYRINGE | INTRAMUSCULAR | Status: DC | PRN
  Filled 2021-11-03: qty 1

## 2021-11-03 NOTE — Consult Note (Signed)
Palliative Care  Consultation Note  87 yo with critical aortic stenosis,refused interventions historically and has been increasing symptomatic over the past year with frequent falls worsening functional status. She is a resident in independent living at [facility]. She was admitted 9/6 sp fall and found to have very large pleural effusion and a newly discovered lung mass although fluid cytology was not diagnostic. She currently has a chest tube placed in the ICU and has required very careful volume management.  Palliative was requested for goals of care.  I met with patient and her daughter to discuss goals of care.  I discussed her current status, summarized the medical issues and options, as well as discussed decisions needing consideration around her goals of care and care plan.  Patient has full mental capacity for independent decision making. Her daughter was extremely supportive.  They understand there is not a reversible or curable medical condition and that her heart issues put her at risk for sudden death, and death is likely in a relatively short period of time. Her QOL has and will continue to be negatively impacted by critical aortic stenosis. The role of the lung mass is secondary to this likely.  Goals:  DNR, Allow for a Natural Death Transition to Full comfort care- she "I want to just slip away peacefully", she said to her daughter she felt like it was "time to say goodbye". She is very much at peace with this decision and so is her daughter. She does not want her life prolonged in any way in her current condition.  Assessment: Terminal Care will be required. High risk for sudden death and decompensation. Prognosis could be sudden death and may have hospital death, if stabilizes after de-escalation she would need a hospice facility for complex symptom management. Her daughter would like to have her transferred to a hospice house in [place] if possible.    Recommendations: Comfort Measures Only. Deescalate current medical interventions, diagnostics and treatments not related to comfort. Fentanyl IV prn for pain and dyspnea for now-she has not been asking for anything other than Tylenol, but I anticipate her symptom management needs to increase after chest tube removal. She would benefit from a hospice facility-will ask TOC to assist with placement in [place], DO Palliative Medicine   Time: 70 min

## 2021-11-03 NOTE — Progress Notes (Signed)
NAME:  [name], MRN:  [id number], DOB:  [birth year], LOS: 41 ADMISSION DATE:  10/23/2021 CONSULTATION DATE:  10/23/2021 REFERRING MD:  [place] - EDP CHIEF COMPLAINT:  Hypotension in the setting of Afib with RVR   BRIEF  87 year old woman who presented to [facility] ED 9/6 post-mechanical fall, also with decreased PO intake/weakness x 5 days. Hit posterior head while getting out of the bathroom; no LOC. Not on AC. PMHx significant for HTN, HLD, COPD/OSA, T2DM, fibromuscular dysplasia, pseudogout.  Per chart review, patient had been feeling very weak and sustained a mechanical fall when ambulating back from the restroom. She struck her posterior head on the floor but did not have LOC. Denies recent fever/chills, CP/SOB, n/v/d or abdominal pain. Endorses HA s/p fall. EMS noted mild hypotension in the field, initially corrected after fluid administration.  On ED evaluation, afebrile with HR 100s, mildly tachypneic to 25, BP 129/62, SpO2 91% on RA. Labs notable for WBC/H&H WNL, Plt 572, lytes WNL, Cr 1.03 (baseline 0.8), normal AST/ALT, Tbili 1.3. Serum LDH 213, trop WNL. LA 1.6. COVID/Flu negative. CT Head/Cspine NAICA, no fracture. CXR with large L pleural effusion. CTA Chest negative for PE, large left pleural effusion noted with R mediastinal shift, occluded LLL bronchus, concern for poorly defined LLL mass.   PCCM consulted for chest tube placement/new LLL mass. Patient became hypotensive requiring Neosynephrine initiation and was admitted to ICU.  Pertinent Medical History:   has a past medical history of Allergic rhinitis, Arthritis, Complication of anesthesia, COPD (chronic obstructive pulmonary disease) ([place]), Diabetes mellitus, Fibromuscular dysplasia ([place]), Hyperlipidemia, Hypertension, PONV (postoperative nausea and vomiting), Pseudogout, and Sleep apnea.   has a past surgical history that includes pituitary tumor removed; Back surgery (2005); Tonsillectomy; nasal septal deviation; bilateral  renal bypass; and Esophagogastroduodenoscopy (N/A, 10/01/2012).  Significant Hospital Events:  9/6 - Presented to [facility] ED for mechanical fall with weakness, poor PO intake. CT Chest negative for PE, c/f new LLL mass and large L pleural effusion with R mediastinal shift. CT Head/Cspine negative. Chest tube placed. 9/7 intrapleural lytics administered dose 1 with 750cc out. CT abd lung cut 9/8 with very small residual effusion only 9/8 Circulatory shock with colitis. RUE PICC Placed. Started flagyl IV 10/25/21: approx 222h ago sudden shock with vomitting and abd pain. CREAT up. On levophed CT with Descending COLITIS. -> Flagyl started. On levophed and hydrocor. ECHO with severe AS and EF > 70% and hyperdymanic -> gioven fluids 9/11  Overall improved, off pressors, episode of Afib overnight now improved, no pain, no complaints, still has chest tube in place with minimal output, tolerating diet 9/12 Repeat dose fibrinolytics yesterday, 100cc chest tube output, respiratory status stable 9/13 minimal chest tube output despite lytics, repeat CXR and CT ordered  CT chest moderate-to-large size loculated left pleural effusion has only minimally decreased in size status post pleural drainage catheter placement. 9/14 removed original chest tube and replace with new tube in visualized pocket. lytics#1 9/15 1.4L fluid removed from new chest tube post  9/15 lytics #2 9/16 Chest tube drained 740 cc  Interim History / Subjective:  Nausea and pain overnight Currently denies chest pain or dyspnea Afebrile 200 cc drainage via chest tube   Objective:  Blood pressure (!) 118/45, pulse 85, temperature (!) 97.5 F (36.4 C), temperature source Oral, resp. rate (!) 28, height '5\' 3"'$  (1.6 m), weight 64.1 kg, SpO2 93 %.        Intake/Output Summary (Last 24 hours) at 11/03/2021 0911 Last  data filed at 11/03/2021 0600 Gross per 24 hour  Intake 616.48 ml  Output 380 ml  Net 236.48 ml    Filed Weights   10/31/21  0500 11/01/21 0500 11/03/21 0500  Weight: 65.3 kg 64.1 kg 64.1 kg   Physical Exam General: Acute on chronically ill appearing thin deconditioned elderly female lying in bed, in NAD HEENT: \[place]/AT, MM pink/moist, PERRL,  Neuro: Alert and oriented x3, non-focal  CV: S1-S2 regular PULM: Bloody drainage from chest tube, decreased breath sounds on left, no accessory muscle use GI: soft, bowel sounds active in all 4 quadrants, non-tender, non-distended,  Extremities: warm/dry, no edema  Skin: no rashes or lesions  Labs show slight drop in hemoglobin to 9.1, stable leukocytosis, normal electrolytes.  Chest x-ray shows left pigtail in position, small left effusion persists  Assessment & Plan:   Acute hypoxemic respite failure secondary to large left pleural effusion that loculated Hx of OSA Pneumonia  -s/p pigtail x 2 &  intrapleural fibrinolytics  New chest tube placed 9/14 s/p lytics #2 P: Discontinue pigtail today Mobilize as able  Repeat CXR in am  Completed 7 days of cefepime  New left lower lobe mass which appears to be pleural-based -Repeat chest CT 9/14 with unchanged pleural-based masslike density in the inferior left hemithorax measuring 3.5 x 7.6 cm. P: Tentative plan for IR to biopsy mass once pigtail is removed vs palliative care discussion based on overall goals of care  Best practice:  Per primary     Kara Mead MD. [facility]. [place] [facility] Pager : [phone]  If no response to pager , please call [phone] until 7 pm After 7:00 pm call Elink  [phone]    11/03/2021, 9:11 AM

## 2021-11-03 NOTE — Progress Notes (Signed)
PROGRESS NOTE [name]  PYP:[id number] DOB: 11-21-1934 DOA: 10/23/2021 PCP: [name], MD   Brief Narrative/Hospital Course: 87 y.o.f with COPD not on home O2, OSA on CPAP, HTN, DM, HLD, hx aortic atherosclerosis, possible chronic descending aortic dissection, and hx renal artery bypass, hx pituitary adenoma s/p resection 2004, 2009, and 2010, hx CPPD, OA and chronic low back pain not on daily opiates who presented with few days weakness then a fall.EMS found her hypotensive. In the ER, CXR showed new large left effusion, CTA chest confirmed large left-sided effusion with consolidative mass.  Patient developed Afib with RVR. 9/6: Admitted to ICU on neo, amio, chest tube placed PCCM consulted for chest tube placement/new LLL mass. Patient became hypotensive requiring Neosynephrine initiation and was admitted to ICU.She has been weaned off vasopressors. She currently has chest tube placed on 10/23/21. Repeat CXR shows persistent loculated pleural effusion. Follow up CT of the chest  shows Moderate-to-large size loculated left pleural effusion has only minimally decreased in size status post pleural drainage catheter placement. Existing pleural catheter replaced and fibrinolytic administered  on 10/31/21 by PCCM.     Subjective: Patient seen and examined. Alert awake communicative no complaints. Chest tube output 210 cc. Labs with WBC slightly up 14.2 creatinine 1.1 Overnight was having pain and nausea.  Assessment and Plan: Principal Problem:   Pleural effusion on left Active Problems:   Lung mass   Paroxysmal atrial fibrillation with RVR (HCC)   Pituitary adenoma (HCC)   Hyperlipidemia   Essential hypertension   Obstructive sleep apnea   Type 2 diabetes mellitus with neurological complications (HCC)   COPD (chronic obstructive pulmonary disease) (HCC)   Aortic atherosclerosis and peripheral vascular disease   Hypotension   Acute respiratory failure with hypoxia (HCC)    Hypomagnesemia   Shock circulatory (HCC)   Severe aortic stenosis   Shock ([place])  Large left pleural effusion loculated Acute hypoxic respiratory failure 2/2 above: Pigtail catheter x2, interpleural lytics> on chest tube since 9/14 and S/P lytics #2.S/p cefepime/Flagyl x 7 days. pulmonary managing-chest tube drained 740 cc 9/16, overnight 200 cc.  Pulmonary plans to discontinue pigtail today mobilize and repeat chest x-ray in the morning.  Continue bronchodilators/ICS, supplemental oxygen and wean as tolerated.   Left lower lobe mass-appears to be pleural-based: Cytology non diagnostic. IR recommended repeat CT in 6 weeks before biopsy is attempted. Now Pulm recommends IR biopsy once pigtail is removed.  Palliative care consulted await further discussion before proceeding with biopsy  PAF W/ with RVR Severe aortic stenosis: In NSR.  Continue amiodarone, metoprolol.Holding anti-coagulation due to use of fibrinolytics> resume once pulmonary is okay.  Hypertension Shock due to colitis Lactic acidosis: Needing pressors-Now off, vitals stable.  Continue midodrine  IWP:YKDXIPJA statin.  Colitis s/p Flagyl  Hypoglycemia: Resolved. Encourage oral intake, check CBG every 6 hours Recent Labs  Lab 11/01/21 0821 11/02/21 1233 11/02/21 1619 11/02/21 2309 11/03/21 0520  GLUCAP 84 116* 118* 84 82    AKI resolved Anemia of critical illness keep Hb more than 8.  Monitor Recent Labs  Lab 10/30/21 0521 10/31/21 0422 11/01/21 0458 11/02/21 0415 11/03/21 0500  HGB 11.1* 10.8* 11.1* 9.8* 9.1*  HCT 34.6* 33.6* 35.0* 30.8* 29.3*    H/o mechanical fall sec to weakness:Imaging is negative.  PTOT as tolerated-advising skilled nursing facility   Hypokalemia,Hypomagnesemia and hypophosphatemia:stable  Goals of care: currently DNR palliative care has been consulted-she is meeting with palliative care today  DVT prophylaxis: enoxaparin (LOVENOX) injection  40 mg Start: 10/25/21 1000 SCDs Start:  10/23/21 1951 Code Status:   Code Status: DNR Family Communication: plan of care discussed with patient at bedside.  No family at bedside. I called her daughter [name] and updated .  Patient status is: Inpatient because of hypoxia chest tube management Level of care: ICU .Patient at risk of decompensation continue to monitor in ICU  Dispo: The patient is from: Home, lives alone, daughter in [place]            Anticipated disposition: Skilled nursing facility  Mobility Assessment (last 72 hours)     [place] Name 11/01/21 2000           What is the highest level of mobility based on the progressive mobility assessment? Level 2 (Chairfast) - Balance while sitting on edge of bed and cannot stand       Is the above level different from baseline mobility prior to current illness? Yes - Recommend PT order                 Objective: Vitals last 24 hrs: Vitals:   11/03/21 0342 11/03/21 0400 11/03/21 0500 11/03/21 0600  BP:  (!) 100/30  (!) 112/35  Pulse:  63  63  Resp:  16  14  Temp: 97.8 F (36.6 C)     TempSrc: Oral     SpO2:  95%  97%  Weight:   64.1 kg   Height:       Weight change:   Physical Examination: General exam: AA, elderly, frail on nasal cannula oxygen older than stated age, weak appearing. HEENT:Oral mucosa moist, Ear/Nose WNL grossly, dentition normal. Respiratory system: bilaterally diminished, no use of accessory muscle, chest tube on left side. Cardiovascular system: S1 & S2 +, No JVD,. Gastrointestinal system: Abdomen soft,NT,ND,BS+ Nervous System:Alert, awake, moving extremities and grossly nonfocal Extremities: LE ankle edema neg, distal peripheral pulses palpable.  Skin: No rashes,no icterus. MSK: Normal muscle bulk,tone, power   Medications reviewed:  Scheduled Meds:  amiodarone  200 mg Oral BID   atorvastatin  80 mg Oral QHS   Chlorhexidine Gluconate Cloth  6 each Topical Daily   enoxaparin (LOVENOX) injection  40 mg  Subcutaneous Q24H   feeding supplement  1 Container Oral TID BM   levothyroxine  50 mcg Oral QAC breakfast   metoprolol tartrate  12.5 mg Oral BID   midodrine  10 mg Oral TID WC   ondansetron (ZOFRAN) IV  4 mg Intravenous Q8H   sodium chloride flush  10 mL Intrapleural Q8H   sodium chloride flush  10-40 mL Intracatheter Q12H   Continuous Infusions:  sodium chloride Stopped (10/28/21 1602)    Diet Order             Diet regular Room service appropriate? Yes; Fluid consistency: Thin  Diet effective now                  Intake/Output Summary (Last 24 hours) at 11/03/2021 0717 Last data filed at 11/03/2021 0600 Gross per 24 hour  Intake 752.21 ml  Output 380 ml  Net 372.21 ml   Net IO Since Admission: -2,243.3 mL [11/03/21 0717]  Wt Readings from Last 3 Encounters:  11/03/21 64.1 kg  02/23/20 66.7 kg  02/21/20 66.7 kg     Unresulted Labs (From admission, onward)     Start     Ordered   11/03/21 9767  Basic metabolic panel  Daily at 5am,  R     Question:  Specimen collection method  Answer:  Unit=Unit collect   11/02/21 0936   11/03/21 0500  CBC  Daily at 5am,   R     Question:  Specimen collection method  Answer:  Unit=Unit collect   11/02/21 0936          Data Reviewed: I have personally reviewed following labs and imaging studies CBC: Recent Labs  Lab 10/30/21 0521 10/31/21 0422 11/01/21 0458 11/02/21 0415 11/03/21 0500  WBC 13.7* 13.0* 12.9* 13.0* 14.2*  NEUTROABS  --  9.6* 9.6*  --   --   HGB 11.1* 10.8* 11.1* 9.8* 9.1*  HCT 34.6* 33.6* 35.0* 30.8* 29.3*  MCV 88.0 88.0 88.8 90.3 90.2  PLT 374 370 380 376 350   Basic Metabolic Panel: Recent Labs  Lab 10/28/21 0440 10/29/21 0443 10/29/21 0525 10/30/21 0521 10/31/21 0422 11/01/21 0458 11/02/21 0415 11/03/21 0500  NA 141   < >  --  137 134* 135 136 135  K 3.6   < >  --  3.0* 3.6 3.5 4.2 4.2  CL 109   < >  --  102 100 101 104 104  CO2 27   < >  --  '30 30 27 28 27  '$ GLUCOSE 120*   < >  --  95  85 64* 91 89  BUN 30*   < >  --  '16 15 16 18 18  '$ CREATININE 1.12*   < >  --  0.78 0.73 0.82 0.96 1.16*  CALCIUM 8.0*   < >  --  7.4* 7.4* 7.5* 7.6* 7.6*  MG 1.7  --  1.9 1.4* 2.0 1.8  --   --   PHOS 2.2*  --  3.4  --  2.6  --   --   --    < > = values in this interval not displayed.   GFR: Estimated Creatinine Clearance: 32 mL/min (A) (by C-G formula based on SCr of 1.16 mg/dL (H)). Liver Function Tests: Recent Labs  Lab 10/27/21 1021 10/28/21 0440  AST 18 31  ALT 9 15  ALKPHOS 101 115  BILITOT 0.4 0.5  PROT 4.8* 5.1*  ALBUMIN 2.0* 2.0*   Recent Labs  Lab 11/01/21 0821 11/02/21 1233 11/02/21 1619 11/02/21 2309 11/03/21 0520  GLUCAP 84 116* 118* 84 82   Antimicrobials: Anti-infectives (From admission, onward)    Start     Dose/Rate Route Frequency Ordered Stop   10/26/21 1300  ceFEPIme (MAXIPIME) 2 g in sodium chloride 0.9 % 100 mL IVPB        2 g 200 mL/hr over 30 Minutes Intravenous Every 12 hours 10/26/21 1211 11/02/21 0109   10/25/21 1700  metroNIDAZOLE (FLAGYL) IVPB 500 mg        500 mg 100 mL/hr over 60 Minutes Intravenous Every 12 hours 10/25/21 1636 11/02/21 1758      Culture/Microbiology    Component Value Date/Time   SDES  10/23/2021 1919    PLEURAL Performed at [facility], [place] [address], [place], [place] [address]    [place]  10/23/2021 1919    NONE Performed at [facility], [place] [address], [place], [place] [address]    CULT  10/23/2021 1919    NO GROWTH 3 DAYS Performed at [place] [address], [place], [place] [address]    REPTSTATUS 10/27/2021 FINAL 10/23/2021 1919    Other culture-see note  Radiology Studies: DG CHEST PORT  1 VIEW  Result Date: 11/02/2021 CLINICAL DATA:  Respiratory failure. EXAM: PORTABLE CHEST 1 VIEW COMPARISON:  11/01/2021 FINDINGS: Right arm PICC line tip is stable at the level of the cavoatrial junction. Left basilar chest tube is again noted. No significant  pneumothorax visualized. Lung volumes are low. Bilateral pleural effusions are again noted and appear unchanged from the previous exam. Atelectasis within both lung bases. IMPRESSION: 1. Stable support apparatus. 2. No change in aeration to the lungs compared with previous exam. 3. Stable bilateral pleural effusions and bibasilar atelectasis. Electronically Signed   By: Kerby Moors M.D.   On: 11/02/2021 09:00     LOS: 11 days   Antonieta Pert, MD [facility]  11/03/2021, 7:17 AM

## 2021-11-04 ENCOUNTER — Inpatient Hospital Stay (HOSPITAL_COMMUNITY): Payer: Medicare PPO

## 2021-11-04 DIAGNOSIS — J90 Pleural effusion, not elsewhere classified: Secondary | ICD-10-CM | POA: Diagnosis not present

## 2021-11-04 MED ORDER — CHLORHEXIDINE GLUCONATE CLOTH 2 % EX PADS
6.0000 | MEDICATED_PAD | Freq: Every day | CUTANEOUS | Status: DC
  Administered 2021-11-04: 6 via TOPICAL

## 2021-11-04 NOTE — Progress Notes (Signed)
PROGRESS NOTE [name]  STM:[id number] DOB: Aug 04, 1934 DOA: 10/23/2021 PCP: [name], MD   Brief Narrative/Hospital Course: 87 y.o.f with COPD not on home O2, OSA on CPAP, HTN, DM, HLD, hx aortic atherosclerosis, possible chronic descending aortic dissection, and hx renal artery bypass, hx pituitary adenoma s/p resection 2004, 2009, and 2010, hx CPPD, OA and chronic low back pain not on daily opiates who presented with few days weakness then a fall.EMS found her hypotensive. In the ER, CXR showed new large left effusion, CTA chest confirmed large left-sided effusion with consolidative mass.  Patient developed Afib with RVR. 9/6: Admitted to ICU on neo, amio, chest tube placed PCCM consulted for chest tube placement/new LLL mass. Patient became hypotensive requiring Neosynephrine initiation and was admitted to ICU.She has been weaned off vasopressors. She currently has chest tube placed on 10/23/21. Repeat CXR shows persistent loculated pleural effusion. Follow up CT of the chest  shows Moderate-to-large size loculated left pleural effusion has only minimally decreased in size status post pleural drainage catheter placement. Existing pleural catheter replaced and fibrinolytic administered  on 10/31/21 by PCCM> pigtail catheter removed 9/17.  Family meeting held with patient care patient/daughter has elected comfort measures.   Subjective: Seen and examined this morning.  Resting comfortably denies any pain.  On nasal cannula oxygen. Overnight no fever  Assessment and Plan: Principal Problem:   Pleural effusion on left Active Problems:   Lung mass   Paroxysmal atrial fibrillation with RVR (HCC)   Pituitary adenoma (HCC)   Hyperlipidemia   Essential hypertension   Obstructive sleep apnea   Type 2 diabetes mellitus with neurological complications (HCC)   COPD (chronic obstructive pulmonary disease) (HCC)   Aortic atherosclerosis and peripheral vascular disease   Hypotension    Acute respiratory failure with hypoxia (HCC)   Hypomagnesemia   Shock circulatory (HCC)   Severe aortic stenosis   Shock ([place])  End-of-life care:Patient with complex medical comorbidities now with left lower lobe mass that needs further biopsy.  Family meeting held patient has elected comfort measures.  Continue supportive care as per palliative  Large left pleural effusion loculated Acute hypoxic respiratory failure 2/2 above: S/p Pigtail catheter x2, interpleural lytics> on chest tube since 9/14 and S/P lytics #2.S/p cefepime/Flagyl x 7 days. pigtail catheter removed 9/17.  Family meeting held with patient care patient/daughter has elected comfort measures  Left lower lobe mass-appears to be pleural-based: Cytology non diagnostic. IR recommended repeat CT in 6 weeks before biopsy is attempted. Pulm recommends IR biopsy once pigtail is removed.  Now on comfort measures  PAF W/ with RVR Severe aortic stenosis: In NSR.Continue metoprolol.Holding anti-coagulation  Hypertension Shock due to colitis Lactic acidosis: Needing pressors-Now off  HLD Colitis s/p Flagyl Hypoglycemia: Resolved. Encourage oral intake, check CBG every 6 hours AKI resolved Anemia of critical illness keep 8-9 H/o mechanical fall sec to weakness:Imaging is negative.  Hypokalemia,Hypomagnesemia and hypophosphatemia:stable  DVT prophylaxis:  Code Status:   Code Status: DNR Family Communication: plan of care discussed with patient at bedside.  No family at bedside. I called her daughter [name] and updated .  Patient status is: Inpatient because of EOL care  Level of care: ICU  Dispo: The patient is from: Home, lives alone, daughter in [place]            Anticipated disposition: Hospice  Mobility Assessment (last 72 hours)     Mobility Assessment     [place] Name 11/01/21 2000  What is the highest level of mobility based on the progressive mobility assessment? Level 2 ([place]) - Balance while  sitting on edge of bed and cannot stand       Is the above level different from baseline mobility prior to current illness? Yes - Recommend PT order                 Objective: Vitals last 24 hrs: Vitals:   11/03/21 1100 11/03/21 1200 11/03/21 2000 11/04/21 0500  BP: (!) 127/43 (!) 106/37    Pulse: 74 67 71 77  Resp: '17 15 20 15  '$ Temp:  98.5 F (36.9 C) 99.1 F (37.3 C)   TempSrc:  Oral Axillary   SpO2: 94% 96% 97% 97%  Weight:      Height:       Weight change:   Physical Examination: General exam: AA, elderly,frail older than stated age, weak appearing. HEENT:Oral mucosa moist, Ear/Nose WNL grossly, dentition normal. Respiratory system: bilaterally diminished, no use of accessory muscle Cardiovascular system: S1 & S2 +, No JVD,. Gastrointestinal system: Abdomen soft,NT,ND,BS+ Nervous System:Alert, awake, moving extremities and grossly nonfocal Extremities: LE ankle edema neg, distal peripheral pulses palpable.  Skin: No rashes,no icterus. MSK: Normal muscle bulk,tone, power    Medications reviewed:  Scheduled Meds:  Chlorhexidine Gluconate Cloth  6 each Topical Daily   metoprolol tartrate  12.5 mg Oral BID   sodium chloride flush  10-40 mL Intracatheter Q12H   Continuous Infusions:  sodium chloride Stopped (10/28/21 1602)    Diet Order             Diet regular Room service appropriate? Yes; Fluid consistency: Thin  Diet effective now                  Data Reviewed: I have personally reviewed following labs and imaging studies CBC: Recent Labs  Lab 10/30/21 0521 10/31/21 0422 11/01/21 0458 11/02/21 0415 11/03/21 0500  WBC 13.7* 13.0* 12.9* 13.0* 14.2*  NEUTROABS  --  9.6* 9.6*  --   --   HGB 11.1* 10.8* 11.1* 9.8* 9.1*  HCT 34.6* 33.6* 35.0* 30.8* 29.3*  MCV 88.0 88.0 88.8 90.3 90.2  PLT 374 370 380 376 329   Basic Metabolic Panel: Recent Labs  Lab 10/29/21 0525 10/30/21 0521 10/31/21 0422 11/01/21 0458 11/02/21 0415 11/03/21 0500  NA   --  137 134* 135 136 135  K  --  3.0* 3.6 3.5 4.2 4.2  CL  --  102 100 101 104 104  CO2  --  '30 30 27 28 27  '$ GLUCOSE  --  95 85 64* 91 89  BUN  --  '16 15 16 18 18  '$ CREATININE  --  0.78 0.73 0.82 0.96 1.16*  CALCIUM  --  7.4* 7.4* 7.5* 7.6* 7.6*  MG 1.9 1.4* 2.0 1.8  --   --   PHOS 3.4  --  2.6  --   --   --    GFR: Estimated Creatinine Clearance: 32 mL/min (A) (by C-G formula based on SCr of 1.16 mg/dL (H)). Liver Function Tests: No results for input(s): "AST", "ALT", "ALKPHOS", "BILITOT", "PROT", "ALBUMIN" in the last 168 hours.  Recent Labs  Lab 11/02/21 1233 11/02/21 1619 11/02/21 2309 11/03/21 0520 11/03/21 1151  GLUCAP 116* 118* 84 82 111*   Antimicrobials: Anti-infectives (From admission, onward)    Start     Dose/Rate Route Frequency Ordered Stop   10/26/21 1300  ceFEPIme (MAXIPIME) 2 g in sodium  chloride 0.9 % 100 mL IVPB        2 g 200 mL/hr over 30 Minutes Intravenous Every 12 hours 10/26/21 1211 11/02/21 0109   10/25/21 1700  metroNIDAZOLE (FLAGYL) IVPB 500 mg        500 mg 100 mL/hr over 60 Minutes Intravenous Every 12 hours 10/25/21 1636 11/02/21 1758      Culture/Microbiology    Component Value Date/Time   SDES  10/23/2021 1919    PLEURAL Performed at [facility], [place] [address] [place], [place] [address]    [place]  10/23/2021 1919    NONE Performed at [facility], [place] [address], [place], [place] [address]    CULT  10/23/2021 1919    NO GROWTH 3 DAYS Performed at [place] [address] [place],  [address]    REPTSTATUS 10/27/2021 FINAL 10/23/2021 1919    Other culture-see note  Radiology Studies: DG Chest Port 1 View  Result Date: 11/04/2021 CLINICAL DATA:  Left pleural effusion. EXAM: PORTABLE CHEST 1 VIEW COMPARISON:  11/02/2021. FINDINGS: The heart size and mediastinal contours are stable. There is atherosclerotic calcification of the aorta. There is a moderate loculated  pleural effusion on the left and small pleural effusion on the right. Mild atelectasis is noted in the lungs bilaterally. No pneumothorax. A right PICC line terminates over the superior vena cava. Vertebroplasty changes are noted in the lower thoracic spine. IMPRESSION: Moderate loculated pleural effusion on the left and small pleural effusion on the right with likely associated compressive atelectasis. Electronically Signed   By: Brett Fairy M.D.   On: 11/04/2021 04:54     LOS: 12 days   Antonieta Pert, MD [facility]  11/04/2021, 7:24 AM

## 2021-11-04 NOTE — TOC Progression Note (Signed)
Transition of Care [facility]) - Progression Note    Patient Details  Name: [name] MRN: [id number] Date of Birth: 07-Jul-1934  Transition of Care [facility]) CM/SW Contact  Leeroy Cha, RN Phone Number: 11/04/2021, 3:33 PM  Clinical Narrative:    Patient transferred from icu to 1620.  Is to go to [facility] at discharge.  Snf Placement.   Expected Discharge Plan: Home/Self Care Barriers to Discharge: Continued Medical Work up  Expected Discharge Plan and Services Expected Discharge Plan: Home/Self Care   Discharge Planning Services: CM Consult   Living arrangements for the past 2 months: Apartment                                       Social Determinants of Health (SDOH) Interventions    Readmission Risk Interventions   Row Labels 02/08/2020    1:57 PM  Readmission Risk Prevention Plan   Section Header. No data exists in this row.   Post Dischage Appt   Complete  Medication Screening   Complete  Transportation Screening   Complete

## 2021-11-05 DIAGNOSIS — Z515 Encounter for palliative care: Secondary | ICD-10-CM | POA: Diagnosis not present

## 2021-11-05 DIAGNOSIS — Z7189 Other specified counseling: Secondary | ICD-10-CM

## 2021-11-05 DIAGNOSIS — J90 Pleural effusion, not elsewhere classified: Secondary | ICD-10-CM | POA: Diagnosis not present

## 2021-11-05 MED ORDER — MELATONIN 3 MG PO TABS
3.0000 mg | ORAL_TABLET | Freq: Once | ORAL | Status: AC
  Administered 2021-11-05: 3 mg via ORAL
  Filled 2021-11-05: qty 1

## 2021-11-05 MED ORDER — MORPHINE SULFATE (CONCENTRATE) 10 MG/0.5ML PO SOLN: 10.0000 mg | ORAL | Status: DC | PRN

## 2021-11-05 MED ORDER — MELATONIN 3 MG PO TABS: 3.0000 mg | ORAL_TABLET | Freq: Every day | ORAL | 0 refills | Status: DC

## 2021-11-05 MED ORDER — METOPROLOL TARTRATE 25 MG PO TABS: 12.5000 mg | ORAL_TABLET | Freq: Two times a day (BID) | ORAL | Status: DC

## 2021-11-05 MED ORDER — ACETAMINOPHEN 500 MG PO TABS
1000.0000 mg | ORAL_TABLET | Freq: Three times a day (TID) | ORAL | Status: DC
  Administered 2021-11-05: 1000 mg via ORAL
  Filled 2021-11-05: qty 2

## 2021-11-05 MED ORDER — MELATONIN 3 MG PO TABS: 3.0000 mg | ORAL_TABLET | Freq: Every day | ORAL | Status: DC

## 2021-11-05 NOTE — Progress Notes (Signed)
Palliative Care Progress Note  87 yo with critical aortic stenosis,refused interventions historically and has been increasing symptomatic over the past year with frequent falls worsening functional status. She is a resident in independent living at [facility]. She was admitted 9/6 sp fall and found to have very large pleural effusion and a newly discovered lung mass although fluid cytology was not diagnostic. She currently has a chest tube placed in the ICU and has required very careful volume management.  Goals of Care conversations with patient and her daughter [name] on 9/17. Patient expressed a clear desire for comfort care. They have a good understanding of the poor prognosis of Aortic Stenosis and the impact it has on her QOL.   She is s/p transition to comfort care and chest tube removal on 9/17.  Options discussed were need for hospice IPU vs.[facility].  Assessment:  [name] looks much improved today compared to yesterday in the ICU, her mood is bright and sheis very engaged in conversation-very much awake and recalls our conversation from yesterday. She has eaten at least half of her dinner and was enjoying cheesecake. She reports improved pain and dyspnea and really has no complaints.  We discussed her care plan and outside of her current condition being a rally she would benefit from [facility] with hosp[facility] following there. I suspect that as long as she is in the bed and not exerting herself she will have some time left in terms of prognosis-unclear how fast her pleural effusion will re-occur or what role her lung mass is playing in that vs. Aortic stenosis.  Spoke with her daughter by phone who agrees to her going back [facility] on LTC level with hospice. Requested [facility] make referral. She has a PICC Line in which can likely be removed-she is taking PO and we can switch her to prn comfort meds by mouth in preparation for  discharge.  Lane Hacker, DO Palliative Medicine  Time: 55 min

## 2021-11-05 NOTE — TOC Progression Note (Addendum)
Transition of Care [facility]) - Progression Note    Patient Details  Name: [name] MRN: [id number] Date of Birth: 04-Jan-1935  Transition of Care [facility]) CM/SW Contact  Leeroy Cha, RN Phone Number: 11/05/2021, 7:41 AM  Clinical Narrative:    [phone] tct-Katie Sallyanne Havers at [place] at [facility] message left for Coral Spikes to return call this am. [phone]-tct-katie isman to please call back.  Expected Discharge Plan: Home/Self Care Barriers to Discharge: Continued Medical Work up  Expected Discharge Plan and Services Expected Discharge Plan: Home/Self Care   Discharge Planning Services: CM Consult   Living arrangements for the past 2 months: Apartment                                       Social Determinants of Health (SDOH) Interventions    Readmission Risk Interventions   Row Labels 02/08/2020    1:57 PM  Readmission Risk Prevention Plan   Section Header. No data exists in this row.   Post Dischage Appt   Complete  Medication Screening   Complete  Transportation Screening   Complete

## 2021-11-05 NOTE — TOC Transition Note (Addendum)
Transition of Care [facility]) - CM/SW Discharge Note   Patient Details  Name: [name] MRN: [id number] Date of Birth: [birth year]  Transition of Care [facility]) CM/SW Contact:  Leeroy Cha, RN Phone Number: 11/05/2021, 10:46 AM   Clinical Narrative:     Tcf-Katie Isam at [facility] , pt will go to bed 42A,  ptar called at 1110 and packet to the unit clerk. Has been following hospital stay and is aware of hospice needs.   Barriers to Discharge: Continued Medical Work up   Patient Goals and CMS Choice Patient states their goals for this hospitalization and ongoing recovery are:: to go home CMS Medicare.gov Compare Post Acute Care list provided to:: Patient    Discharge Placement                       Discharge Plan and Services   Discharge Planning Services: CM Consult                                 Social Determinants of Health (SDOH) Interventions     Readmission Risk Interventions   Row Labels 02/08/2020    1:57 PM  Readmission Risk Prevention Plan   Section Header. No data exists in this row.   Post Dischage Appt   Complete  Medication Screening   Complete  Transportation Screening   Complete

## 2021-11-05 NOTE — Progress Notes (Signed)
Called and gave report about patient to Deja at [facility].

## 2021-11-05 NOTE — Progress Notes (Signed)
IVT arrived to remove picc per order.  Pt eating lunch upon arrival.  IVT advised pt she would need to lay down for 10mn after removal and pt preferred to wait until she finished eating lunch.  IVT will come back.

## 2021-11-05 NOTE — Discharge Summary (Signed)
Physician Discharge Summary  [name] NID:[id number] DOB: 01-21-1935 DOA: 10/23/2021  PCP: [name], MD  Admit date: 10/23/2021 Discharge date: 11/05/2021 Recommendations for Outpatient Follow-up:  Follow up with PCP Barbie Banner scheduled  Discharge Dispo: Friend's home with hospice care Discharge Condition: Stable Code Status:   Code Status: DNR Diet recommendation:  Diet Order             Diet regular Room service appropriate? Yes; Fluid consistency: Thin  Diet effective now                    Brief/Interim Summary: 87 y.o.f with COPD not on home O2, OSA on CPAP, HTN, DM, HLD, hx aortic atherosclerosis, possible chronic descending aortic dissection, and hx renal artery bypass, hx pituitary adenoma s/p resection 2004, 2009, and 2010, hx CPPD, OA and chronic low back pain not on daily opiates who presented with few days weakness then a fall.EMS found her hypotensive. In the ER, CXR showed new large left effusion, CTA chest confirmed large left-sided effusion with consolidative mass.  Patient developed Afib with RVR. 9/6: Admitted to ICU on neo, amio, chest tube placed PCCM consulted for chest tube placement/new LLL mass. Patient became hypotensive requiring Neosynephrine initiation and was admitted to ICU.She has been weaned off vasopressors. She currently has chest tube placed on 10/23/21. Repeat CXR shows persistent loculated pleural effusion. Follow up CT of the chest  shows Moderate-to-large size loculated left pleural effusion has only minimally decreased in size status post pleural drainage catheter placement. Existing pleural catheter replaced and fibrinolytic administered  on 10/31/21 by PCCM> pigtail catheter removed 9/17.  Family meeting held with patient care patient/daughter has elected comfort measures. Plan is for discharge to a friend's home w/ LTC hospice care  Discharge Diagnoses:  Principal Problem:   Pleural effusion on left Active Problems:   Lung mass    Paroxysmal atrial fibrillation with RVR ([place])   Pituitary adenoma ([place])   Hyperlipidemia   Essential hypertension   Obstructive sleep apnea   Type 2 diabetes mellitus with neurological complications (HCC)   COPD (chronic obstructive pulmonary disease) (HCC)   Aortic atherosclerosis and peripheral vascular disease   Hypotension   Acute respiratory failure with hypoxia (HCC)   Hypomagnesemia   Shock circulatory (HCC)   Severe aortic stenosis   Shock ([place])  End-of-life care:Patient with complex medical comorbidities now with left lower lobe mass that needs further biopsy.  Family meeting held patient has elected comfort measures.  Continue supportive care as per palliative   Large left pleural effusion loculated Acute hypoxic respiratory failure 2/2 above: S/p Pigtail catheter x2, interpleural lytics> on chest tube since 9/14 and S/P lytics #2.S/p cefepime/Flagyl x 7 days. pigtail catheter removed 9/17.  Family meeting held with patient care patient/daughter has elected comfort measures   Left lower lobe mass-appears to be pleural-based: Cytology non diagnostic. IR recommended repeat CT in 6 weeks before biopsy is attempted. Pulm recommends IR biopsy once pigtail is removed.  Now on comfort measures   PAF W/ with RVR Severe aortic stenosis: In NSR.Continue metoprolol.Holding anti-coagulation   Hypertension Shock due to colitis Lactic acidosis: Needing pressors-Now off   HLD Colitis s/p Flagyl Hypoglycemia: Resolved. Encourage oral intake, check CBG every 6 hours AKI resolved Anemia of critical illness keep 8-9 H/o mechanical fall sec to weakness:Imaging is negative.  Hypokalemia,Hypomagnesemia and hypophosphatemia:stable  Consults: Palliative care, cardiology, PCCM Subjective: Follow up awake resting comfortably no new complaints  Discharge Exam: Vitals:  11/04/21 2216 11/05/21 0448  BP: (!) 117/57 (!) 141/71  Pulse: 93 87  Resp:  18  Temp:  97.9 F (36.6 C)   SpO2:  94%   General: Pt is alert, awake, not in acute distress Cardiovascular: RRR, S1/S2 +, no rubs, no gallops Respiratory: CTA bilaterally, no wheezing, no rhonchi Abdominal: Soft, NT, ND, bowel sounds + Extremities: no edema, no cyanosis  Discharge Instructions  Discharge Instructions     Discharge instructions   Complete by: As directed    Continue with hospice care at friend's home   Increase activity slowly   Complete by: As directed       Allergies as of 11/05/2021       Reactions   Valacyclovir Hcl Rash        Medication List     STOP taking these medications    amLODipine 10 MG tablet Commonly known as: NORVASC   atorvastatin 80 MG tablet Commonly known as: LIPITOR   gabapentin 600 MG tablet Commonly known as: NEURONTIN   lactose free nutrition Liqd   losartan 50 MG tablet Commonly known as: COZAAR   magnesium hydroxide 400 MG/5ML suspension Commonly known as: MILK OF MAGNESIA       TAKE these medications    acetaminophen 325 MG tablet Commonly known as: TYLENOL Take 325-650 mg by mouth every 6 (six) hours as needed for headache or mild pain (CANNOT EXCEED A SUM TOTAL OF 3,000 MG/DAY- FROM ALL COMBINED SOURCES and phone MD, if no relief).   bisacodyl 10 MG suppository Commonly known as: DULCOLAX Place 10 mg rectally as needed for moderate constipation.   fexofenadine 180 MG tablet Commonly known as: ALLEGRA Take 180 mg by mouth daily.   levothyroxine 50 MCG tablet Commonly known as: SYNTHROID Take 50 mcg by mouth daily.   melatonin 3 MG Tabs tablet Take 1 tablet (3 mg total) by mouth at bedtime.   metoprolol tartrate 25 MG tablet Commonly known as: LOPRESSOR Take 0.5 tablets (12.5 mg total) by mouth 2 (two) times daily.   polyvinyl alcohol 1.4 % ophthalmic solution Commonly known as: LIQUIFILM TEARS Place 1 drop into both eyes as needed for dry eyes.   RA SALINE ENEMA RE Place 1 enema rectally as needed.         Follow-up Information     Jerline Pain, MD Follow up on 11/08/2021.   Specialty: Cardiology Why: with Ambrose Pancoast, NP for Dr. Marlou Porch at 10:30AM  please try to arrive 15 min early for check in Contact information: [address] [place] [address] [phone]                Allergies  Allergen Reactions   Valacyclovir Hcl Rash    The results of significant diagnostics from this hospitalization (including imaging, microbiology, ancillary and laboratory) are listed below for reference.    Microbiology: No results found for this or any previous visit (from the past 240 hour(s)).  Procedures/Studies: DG Chest Port 1 View  Result Date: 11/04/2021 CLINICAL DATA:  Left pleural effusion. EXAM: PORTABLE CHEST 1 VIEW COMPARISON:  11/02/2021. FINDINGS: The heart size and mediastinal contours are stable. There is atherosclerotic calcification of the aorta. There is a moderate loculated pleural effusion on the left and small pleural effusion on the right. Mild atelectasis is noted in the lungs bilaterally. No pneumothorax. A right PICC line terminates over the superior vena cava. Vertebroplasty changes are noted in the lower thoracic spine. IMPRESSION: Moderate loculated  pleural effusion on the left and small pleural effusion on the right with likely associated compressive atelectasis. Electronically Signed   By: Brett Fairy M.D.   On: 11/04/2021 04:54   DG CHEST PORT 1 VIEW  Result Date: 11/02/2021 CLINICAL DATA:  Respiratory failure. EXAM: PORTABLE CHEST 1 VIEW COMPARISON:  11/01/2021 FINDINGS: Right arm PICC line tip is stable at the level of the cavoatrial junction. Left basilar chest tube is again noted. No significant pneumothorax visualized. Lung volumes are low. Bilateral pleural effusions are again noted and appear unchanged from the previous exam. Atelectasis within both lung bases. IMPRESSION: 1. Stable support apparatus. 2. No change in aeration to the lungs  compared with previous exam. 3. Stable bilateral pleural effusions and bibasilar atelectasis. Electronically Signed   By: Kerby Moors M.D.   On: 11/02/2021 09:00   DG Chest Port 1 View  Result Date: 11/01/2021 CLINICAL DATA:  Respiratory failure and left chest tube for pleural effusion. EXAM: PORTABLE CHEST 1 VIEW COMPARISON:  10/31/2021 chest x-ray and CT of the chest on 10/30/2021 FINDINGS: Left basilar pigtail chest tube remains in place with interval significant reduction in pleural fluid volume. Resultant improved aeration of the left lung with persistent opacity in the left hilar region and left lower lung. Underlying mass again cannot be excluded. Stable small right pleural effusion. Right upper extremity PICC line tip in lower SVC. No pneumothorax. IMPRESSION: Significant reduction in left pleural fluid volume with resultant improved aeration of the left lung. Persistent opacity in the left hilar region and left lower lung. Underlying mass again cannot be excluded. Electronically Signed   By: Aletta Edouard M.D.   On: 11/01/2021 08:07   DG Chest Port 1 View  Result Date: 10/31/2021 CLINICAL DATA:  Chest tube in place. EXAM: PORTABLE CHEST 1 VIEW COMPARISON:  October 30, 2021. FINDINGS: Stable cardiomediastinal silhouette. Right-sided PICC line is in good position. Continued presence of left basilar chest tube. Stable loculated left pleural effusion is noted in the left upper lobe region. Stable left pleural effusion is noted with probable associated atelectasis. Bony thorax is unremarkable. IMPRESSION: Stable position of left-sided chest tube with associated left pleural effusion and associated atelectasis. Continued presence of loculated pleural effusion seen in left upper lobe. Electronically Signed   By: Marijo Conception M.D.   On: 10/31/2021 13:07   CT CHEST WO CONTRAST  Result Date: 10/30/2021 CLINICAL DATA:  Pleural effusion.  Malignancy suspected. EXAM: CT CHEST WITHOUT CONTRAST  TECHNIQUE: Multidetector CT imaging of the chest was performed following the standard protocol without IV contrast. RADIATION DOSE REDUCTION: This exam was performed according to the departmental dose-optimization program which includes automated exposure control, adjustment of the mA and/or kV according to patient size and/or use of iterative reconstruction technique. COMPARISON:  Chest x-ray 10/30/2021. CT abdomen and pelvis 10/25/2021. CT angiogram chest 10/23/2021 FINDINGS: Cardiovascular: Aorta is normal in size. There are atherosclerotic calcifications of the aorta and coronary arteries. The heart is normal in size. There is no pericardial effusion. Right upper extremity PICC terminates in the SVC. Mediastinum/Nodes: There is an enlarged subcarinal lymph node measuring 1.9 by 3.0 cm. This is similar to prior study. Thyroid gland, trachea, and esophagus demonstrate no significant findings. Difficult to assess for left hilar adenopathy secondary to lack of contrast. Lungs/Pleura: There is a moderate-to-large sized left pleural effusion which is partially loculated. This is only minimally decreased in size. New left pleural drainage catheter is in place in the anterior lower hemithorax. There  is no pneumothorax. There is compressive atelectasis of the left upper lobe and left lower lobe. Underlying left lower lobe mass would be difficult to exclude. Faint masslike opacity is seen in the lower posterior left pleural space measuring 3.5 x 7.6 cm image 2/122. There is a small layering right pleural effusion. There is atelectasis of the right lower lobe. Mild emphysematous changes are present. Upper Abdomen: No acute abnormality. Musculoskeletal: Mild compression deformity of T3 appears chronic and unchanged. Vertebroplasty changes at T11 are unchanged. No acute fracture or focal osseous lesion identified. IMPRESSION: 1. Moderate-to-large size loculated left pleural effusion has only minimally decreased in size  status post pleural drainage catheter placement. 2. There continues to be compressive atelectasis of the left lower lobe. An underlying left lower lobe mass can not be excluded. 3. Unchanged pleural-based masslike density in the inferior left hemithorax measuring 3.5 x 7.6 cm. Malignancy not excluded. 4. Stable subcarinal pathologic lymphadenopathy. 5. Small right pleural effusion is new from prior. 6. Aortic Atherosclerosis (ICD10-I70.0) and Emphysema (ICD10-J43.9). Electronically Signed   By: Ronney Asters M.D.   On: 10/30/2021 21:39   DG Chest Port 1 View  Result Date: 10/30/2021 CLINICAL DATA:  Increased shortness of breath this morning EXAM: PORTABLE CHEST 1 VIEW COMPARISON:  Portable exam 0817 hours compared to 10/29/2021 FINDINGS: LEFT thoracostomy tube stable. RIGHT arm PICC line tip projects over SVC. Normal heart size, mediastinal contours, and pulmonary vascularity. Atherosclerotic calcification aorta. Persistent partially loculated moderate-sized LEFT pleural effusion and basilar atelectasis. Mild RIGHT basilar atelectasis as well. No pneumothorax or acute osseous findings. IMPRESSION: Persistent partially loculated LEFT pleural effusion and LEFT lung atelectasis. Subsegmental atelectasis RIGHT base. Electronically Signed   By: Lavonia Dana M.D.   On: 10/30/2021 08:41   DG CHEST PORT 1 VIEW  Result Date: 10/29/2021 CLINICAL DATA:  Pleural effusion in a female at age 34. EXAM: PORTABLE CHEST 1 VIEW COMPARISON:  October 27, 2021. FINDINGS: EKG leads project over the chest. LEFT-sided chest tube remains in place, small bore tube. Since the previous exam there is increased pleural fluid in the LEFT chest along the lateral chest and extending to the apex. This shows a loculated appearance and while of smaller volume than the study of October 23, 2021 is increased since the most recent exam. Cardiomediastinal contours and hilar structures grossly stable though largely obscured in the LEFT chest due  to increase in pleural fluid. Graded opacity also suggested at the RIGHT lung base. No visible pneumothorax. On limited assessment no acute skeletal process with RIGHT-sided PICC line in place terminating at the caval to atrial junction. IMPRESSION: 1. Increasing pleural fluid along the lateral and apical chest showing slow increase over a series of recent comparison studies. 2. Chest tube remaining in place, similar position. 3. Likely small effusion at the RIGHT lung base. 4. No visible pneumothorax. Electronically Signed   By: Zetta Bills M.D.   On: 10/29/2021 08:21   DG CHEST PORT 1 VIEW  Result Date: 10/27/2021 CLINICAL DATA:  Left-sided chest tube. EXAM: PORTABLE CHEST 1 VIEW COMPARISON:  10/25/2021 FINDINGS: Right-sided PICC line unchanged with tip over the SVC. Left basilar chest tube unchanged. No evidence of left-sided pneumothorax. Stable opacification over the left lung base likely small effusion with associated atelectasis. Slight worsening hazy attenuation over the left mid to lower lung which may be due to layering pleural fluid and less likely asymmetric edema/infection. Right lung is clear. Cardiomediastinal silhouette and remainder of the exam is unchanged. IMPRESSION: 1.  Stable left base opacification likely small effusion with associated atelectasis. Slight worsening hazy attenuation over the left mid to lower lung which may be due to layering pleural fluid and less likely asymmetric edema/infection. 2. Tubes and lines as described. Electronically Signed   By: Marin Olp M.D.   On: 10/27/2021 10:03   DG CHEST PORT 1 VIEW  Result Date: 10/25/2021 CLINICAL DATA:  Pleural effusion history of COPD EXAM: PORTABLE CHEST 1 VIEW COMPARISON:  10/25/2021, 10/24/2021, 10/23/2021, CT chest 10/23/2021 FINDINGS: Left-sided chest tube remains in place. Residual small moderate left effusion and small right effusion. Persistent consolidation and airspace disease at left base. Left hilar enlargement  no discrete left pneumothorax. Right upper extremity central venous catheter tip at the cavoatrial junction. IMPRESSION: 1. Similar small moderate left effusion and airspace disease at left lung base. 2. Trace right pleural effusion Electronically Signed   By: Donavan Foil M.D.   On: 10/25/2021 18:08   ECHOCARDIOGRAM COMPLETE  Result Date: 10/25/2021    ECHOCARDIOGRAM REPORT   Patient Name:   [name] Date of Exam: 10/25/2021 Medical Rec #:  [id number]            Height:       63.0 in Accession #:    [id number]           Weight:       134.9 lb Date of Birth:  September 19, 1934            BSA:          1.636 m Patient Age:    22 years             BP:           122/53 mmHg Patient Gender: F                    HR:           86 bpm. Exam Location:  Inpatient Procedure: 2D Echo and Intracardiac Opacification Agent Indications:    AFIB  History:        Patient has prior history of Echocardiogram examinations, most                 recent 06/03/2016. Arrythmias:Atrial Fibrillation; Risk                 Factors:Hypertension and Diabetes.  Sonographer:    Harvie Junior Referring Phys: [phone] MURALI RAMASWAMY  Sonographer Comments: Technically difficult study due to poor echo windows, suboptimal apical window and suboptimal parasternal window. Image acquisition challenging due to COPD, Image acquisition challenging due to respiratory motion and patient supine. IMPRESSIONS  1. Calcified aortic valve with concern for low flow, low gradient severe AS (mean gradient 22 mmHg, AVA 0.7 cm2 and DI 0.24).  2. Left ventricular ejection fraction, by estimation, is >75%. The left ventricle has hyperdynamic function. The left ventricle has no regional wall motion abnormalities. There is moderate concentric left ventricular hypertrophy. Left ventricular diastolic parameters are consistent with Grade I diastolic dysfunction (impaired relaxation).  3. Right ventricular systolic function is normal. The right ventricular size is normal. There  is mildly elevated pulmonary artery systolic pressure.  4. The mitral valve is normal in structure. Trivial mitral valve regurgitation. No evidence of mitral stenosis.  5. The aortic valve is calcified. Aortic valve regurgitation is trivial. Severe aortic valve stenosis.  6. The inferior vena cava is normal in size with greater than 50% respiratory variability, suggesting right atrial pressure of  3 mmHg. Comparison(s): No prior Echocardiogram. FINDINGS  Left Ventricle: Left ventricular ejection fraction, by estimation, is >75%. The left ventricle has hyperdynamic function. The left ventricle has no regional wall motion abnormalities. Definity contrast agent was given IV to delineate the left ventricular endocardial borders. The left ventricular internal cavity size was normal in size. There is moderate concentric left ventricular hypertrophy. Left ventricular diastolic parameters are consistent with Grade I diastolic dysfunction (impaired relaxation). Right Ventricle: The right ventricular size is normal. Right ventricular systolic function is normal. There is mildly elevated pulmonary artery systolic pressure. The tricuspid regurgitant velocity is 3.01 m/s, and with an assumed right atrial pressure of 8 mmHg, the estimated right ventricular systolic pressure is 44.0 mmHg. Left Atrium: Left atrial size was normal in size. Right Atrium: Right atrial size was normal in size. Pericardium: There is no evidence of pericardial effusion. Mitral Valve: The mitral valve is normal in structure. Mild mitral annular calcification. Trivial mitral valve regurgitation. No evidence of mitral valve stenosis. Tricuspid Valve: The tricuspid valve is normal in structure. Tricuspid valve regurgitation is mild . No evidence of tricuspid stenosis. Aortic Valve: The aortic valve is calcified. Aortic valve regurgitation is trivial. Aortic regurgitation PHT measures 513 msec. Severe aortic stenosis is present. Aortic valve mean gradient  measures 21.5 mmHg. Aortic valve peak gradient measures 34.0 mmHg. Aortic valve area, by VTI measures 0.74 cm. Pulmonic Valve: The pulmonic valve was normal in structure. Pulmonic valve regurgitation is not visualized. No evidence of pulmonic stenosis. Aorta: The aortic root is normal in size and structure. Venous: The inferior vena cava is normal in size with greater than 50% respiratory variability, suggesting right atrial pressure of 3 mmHg. IAS/Shunts: No atrial level shunt detected by color flow Doppler. Additional Comments: Calcified aortic valve with concern for low flow, low gradient severe AS (mean gradient 22 mmHg, AVA 0.7 cm2 and DI 0.24).  LEFT VENTRICLE PLAX 2D LVIDd:         2.25 cm     Diastology LVIDs:         1.50 cm     LV e' medial:    4.03 cm/s LV PW:         1.20 cm     LV E/e' medial:  13.3 LV IVS:        1.20 cm     LV e' lateral:   3.59 cm/s LVOT diam:     2.00 cm     LV E/e' lateral: 14.9 LV SV:         32 LV SV Index:   19 LVOT Area:     3.14 cm  LV Volumes (MOD) LV vol d, MOD A2C: 26.3 ml LV vol d, MOD A4C: 35.7 ml LV vol s, MOD A2C: 4.7 ml LV vol s, MOD A4C: 8.9 ml LV SV MOD A2C:     21.6 ml LV SV MOD A4C:     35.7 ml LV SV MOD BP:      24.2 ml RIGHT VENTRICLE RV S prime:     6.64 cm/s TAPSE (M-mode): 1.0 cm LEFT ATRIUM             Index LA diam:        2.60 cm 1.59 cm/m LA Vol (A2C):   17.7 ml 10.82 ml/m LA Vol (A4C):   18.5 ml 11.31 ml/m LA Biplane Vol: 18.0 ml 11.00 ml/m  AORTIC VALVE  PULMONIC VALVE AV Area (Vmax):    0.67 cm      PV Vmax:       2.31 m/s AV Area (Vmean):   0.66 cm      PV Peak grad:  21.3 mmHg AV Area (VTI):     0.74 cm AV Vmax:           291.75 cm/s AV Vmean:          189.333 cm/s AV VTI:            0.428 m AV Peak Grad:      34.0 mmHg AV Mean Grad:      21.5 mmHg LVOT Vmax:         62.10 cm/s LVOT Vmean:        39.800 cm/s LVOT VTI:          0.101 m LVOT/AV VTI ratio: 0.24 AI PHT:            513 msec  AORTA Ao Root diam: 3.00 cm Ao Asc  diam:  3.00 cm MITRAL VALVE               TRICUSPID VALVE MV Area (PHT): 3.63 cm    TR Peak grad:   36.2 mmHg MV Decel Time: 209 msec    TR Vmax:        301.00 cm/s MR Peak grad: 31.8 mmHg MR Vmax:      282.00 cm/s  SHUNTS MV E velocity: 53.60 cm/s  Systemic VTI:  0.10 m MV A velocity: 60.40 cm/s  Systemic Diam: 2.00 cm MV E/A ratio:  0.89 Kirk Ruths MD Electronically signed by Kirk Ruths MD Signature Date/Time: 10/25/2021/4:43:55 PM    Final    CT ABDOMEN PELVIS WO CONTRAST  Result Date: 10/25/2021 CLINICAL DATA:  Abdominal pain, acute, nonlocalized. Nausea and vomiting. EXAM: CT ABDOMEN AND PELVIS WITHOUT CONTRAST TECHNIQUE: Multidetector CT imaging of the abdomen and pelvis was performed following the standard protocol without IV contrast. RADIATION DOSE REDUCTION: This exam was performed according to the departmental dose-optimization program which includes automated exposure control, adjustment of the mA and/or kV according to patient size and/or use of iterative reconstruction technique. COMPARISON:  CT abdomen dated 10/01/2012. Chest CT angiogram dated 10/23/2021. FINDINGS: Lower chest: Small bilateral pleural effusions. The LEFT-sided pleural effusion is significantly decreased compared to the chest CT angiogram of 10/23/2021, status post drainage. There is associated bibasilar atelectasis. Additional dense mass-like consolidations are seen within the LEFT lower lung, lateral component measuring 6.6 x 4.1 cm (series 2, image 8) and posterior-medial component measuring 7 x 3 cm (series 2, image 20). The posterior-medial masslike consolidation was also described on the chest CT angiogram report of 10/23/2021 and are highly suspicious for neoplastic masses. Hepatobiliary: No focal liver abnormality is seen. Small amount of layering sludge and/or stones within the gallbladder versus vicarious excretion of earlier contrast administration. Gallbladder is otherwise unremarkable. No pericholecystic  inflammation. No bile duct dilatation is seen. Pancreas: Unremarkable. No pancreatic ductal dilatation or surrounding inflammatory changes. Spleen: Normal in size without focal abnormality. Adrenals/Urinary Tract: 1.3 cm mass associated with the LEFT adrenal gland with Hounsfield unit measurement of 25, stable compared to the earlier CT abdomen of 10/01/2012. No follow-up imaging is recommended for this benign finding. RIGHT adrenal gland is unremarkable. Kidneys are unremarkable without mass, stone or hydronephrosis. No ureteral calculi are identified. Bladder is unremarkable, partially decompressed. Stomach/Bowel: Fairly prominent stool ball in the rectal vault. Rectosigmoid colon is otherwise unremarkable. Diverticulosis  is present within the upper sigmoid colon but no focal inflammatory change to suggest acute diverticulitis. Questionable thickening of the walls of the descending colon, with subtle pericolonic inflammation/fluid stranding, suspicious for a localized colitis of infectious or inflammatory nature. Remainder of the colon is unremarkable. Appendix is not seen but there are no inflammatory changes about the cecum to suggest acute appendicitis. Stomach is unremarkable, partially decompressed. Vascular/Lymphatic: Extensive aortic atherosclerosis. Infrarenal abdominal aorta is grossly stable in caliber. 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 Bypass graft again noted from the RIGHT common iliac artery to the RIGHT kidney. No acute-appearing vascular abnormality is identified, although characterization is limited by the lack of IV contrast. No enlarged lymph nodes are seen within the abdomen or pelvis. Reproductive: Uterus and bilateral adnexa are unremarkable. Other: No free fluid, hemorrhage or abscess collection is seen within the abdomen or pelvis. No free intraperitoneal air. Musculoskeletal: Chronic compression fracture deformity  of the T11 vertebral body, status post earlier vertebroplasty, stable appearance compared to the earlier CT abdomen of 10/01/2012. No acute-appearing osseous abnormality. IMPRESSION: 1. Questionable thickening of the walls of the descending colon, with subtle pericolonic inflammation/fluid stranding, suspicious for a localized mild colitis of infectious or inflammatory nature. No bowel obstruction or evidence of small bowel inflammation. 2. Small bilateral pleural effusions. The LEFT-sided pleural effusion is significantly decreased compared to the chest CT angiogram of 10/23/2021, status post drainage. There is associated bibasilar atelectasis. 3. Additional dense mass-like consolidations are seen within the LEFT lower lung, both possibly pleural based, lateral component measuring 6.6 x 4.1 cm and posterior-medial component measuring 7 x 3 cm. The posterior-medial masslike consolidation was also described on the chest CT angiogram report of 10/23/2021 and are highly suspicious for neoplastic masses. 4. Fairly prominent stool ball in the rectal vault. 5. Sigmoid colon diverticulosis without evidence of acute diverticulitis. 6. Small amount of layering sludge and/or stones within the gallbladder versus vicarious excretion of earlier contrast administration. No evidence of acute cholecystitis. Aortic Atherosclerosis (ICD10-I70.0). Electronically Signed   By: Franki Cabot M.D.   On: 10/25/2021 12:55   [place] EKG SITE RITE  Result Date: 10/25/2021 If Site Rite image not attached, placement could not be confirmed due to current cardiac rhythm.  DG Chest Port 1 View  Result Date: 10/25/2021 CLINICAL DATA:  Pleural effusion on left. EXAM: PORTABLE CHEST 1 VIEW COMPARISON:  Chest x-ray dated October 24, 2021 FINDINGS: Visualized cardiac and mediastinal contours are unchanged, including left hilar fullness. Small left pleural effusion is slightly decreased in size when compared with prior exam. Left-sided chest tube in  place. Bibasilar atelectasis. No evidence of pneumothorax. IMPRESSION: Small left pleural effusion is slightly decreased in size when compared to prior exam. Left-sided chest tube in place. Electronically Signed   By: Yetta Glassman M.D.   On: 10/25/2021 08:51   DG Chest Port 1 View  Result Date: 10/24/2021 CLINICAL DATA:  [id number].  Chest tube and left pleural effusion. EXAM: PORTABLE CHEST 1 VIEW COMPARISON:  Portable chest yesterday at 7:16 p.m. FINDINGS: 4:30 a.m. Pigtail left chest tube positioning is unaltered. There is no visible pneumothorax. Moderate-sized left pleural effusion is noted with mild improvement, previously extending to the mid hilar level whereas now the meniscus is at the level of the inferior hilum. There may be some loculated fluid about the hilum, most likely in the fissure if present. There is left basilar overlying consolidation or atelectasis with the  bilateral lungs elsewhere clear. There is cardiomegaly without vascular congestion. The aorta is tortuous and calcified. Osteopenia and thoracic degenerative changes. Kyphoplasty cement at T11. IMPRESSION: Moderate left pleural effusion with improvement. Stable left chest tube positioning with no visible pneumothorax. Query left perihilar loculated fluid in the fissure, with left basilar consolidation or atelectasis. Aortic atherosclerosis. Electronically Signed   By: Telford Nab M.D.   On: 10/24/2021 07:05   DG Chest 1 View  Result Date: 10/23/2021 CLINICAL DATA:  [id number] EXAM: CHEST  1 VIEW COMPARISON:  Chest x-ray 10/23/2021 FINDINGS: The heart and mediastinal contours are grossly within normal limits with silhouetting off of the left ventricle due to lung disease. Atherosclerotic plaque. Interval placement of a left chest tube with pigtail overlying the mid to lower left lung zone. Slightly improved aeration of the left upper lobe. No pulmonary edema. Slight interval decrease in size of an at least moderate sized left pleural  effusion. No pneumothorax. No acute osseous abnormality. Degenerative changes of the right shoulder. IMPRESSION: 1. Interval placement of a left chest tube in appropriate position with slight interval decrease in size of an at least moderate volume left pleural effusion. 2.  Aortic Atherosclerosis (ICD10-I70.0). Electronically Signed   By: Iven Finn M.D.   On: 10/23/2021 19:34   CT Angio Chest PE W and/or Wo Contrast  Result Date: 10/23/2021 CLINICAL DATA:  History of fall abnormal chest x-ray EXAM: CT ANGIOGRAPHY CHEST WITH CONTRAST TECHNIQUE: Multidetector CT imaging of the chest was performed using the standard protocol during bolus administration of intravenous contrast. Multiplanar CT image reconstructions and MIPs were obtained to evaluate the vascular anatomy. RADIATION DOSE REDUCTION: This exam was performed according to the departmental dose-optimization program which includes automated exposure control, adjustment of the mA and/or kV according to patient size and/or use of iterative reconstruction technique. CONTRAST:  56m OMNIPAQUE IOHEXOL 350 MG/ML SOLN COMPARISON:  Chest x-ray 10/23/2021, 02/07/2020, CT chest 03/16/2016, CT 10/01/2012 FINDINGS: Cardiovascular: Satisfactory opacification of the pulmonary arteries to the segmental level. No acute pulmonary embolism is visualized. Advanced aortic calcification. Poor contrast opacification of the aortic arch and descending thoracic aorta. Chronically displaced wall calcifications at the mid descending thoracic aorta, maximum aortic diameter of 3.6 cm at this level. Coronary vascular calcifications. Normal cardiac size. No pericardial effusion. Mediastinum/Nodes: Midline trachea. Heterogeneous enlarged left lobe of thyroid with multiple nodules, unchanged. Stability for greater than 5 years implies benignity; no biopsy or followup indicated (ref: J Am Coll Radiol. 2015 Feb;12(2): 143-50). Mildly enlarged right paratracheal node measuring 11 mm,  series 7, image 53. Upper esophagus contains a small amount of debris. The distal esophagus is decompressed. There is ill-defined soft tissue density within the subcarinal region that is difficult to separate from the decompressed esophagus. This appears contiguous with soft tissue density surrounding the left lower lobe bronchus. Mass effect on the left atrium. Lungs/Pleura: Emphysema. Large left-sided pleural effusion with shift of mediastinal contents to the right. Atelectasis of most of the left lung. Heterogeneous hypodense areas within left lower lobe consolidative process, series 7, image 91 suggestive of necrosis. Left lower lobe consolidative process cannot be separated from the soft tissue abnormality surrounding the left lower lobe bronchus and extending into the mediastinum. Delayed images of the lower chest demonstrate a heterogenous enhancing mass abutting or arising from the left posterior pleural surface, this measures 7 by 3.6 cm, series 10, image 26. Suspicion of additional pleural based lesion posterior medially at the level of the distal arch, series 7  image 49. mild irregular narrowing of left upper lobe bronchus as well. Upper Abdomen: No acute process in the upper abdomen. Stable 1.4 cm left adrenal mass, likely representing adenoma given long-term stability, no follow-up imaging is recommended. Chronic mild aneurysmal dilatation of the left renal artery. Musculoskeletal: No acute osseous abnormality Review of the MIP images confirms the above findings. IMPRESSION: 1. Negative for acute pulmonary embolus. 2. Large left-sided pleural effusion with shift of mediastinal contents to the right. Atelectasis of most of the left lung. Occluded left lower lobe bronchus. Poorly defined soft tissue density/suspected hilar/infrahilar and left lower lobe mass, poorly separated from ill-defined subcarinal soft tissue density. This exerts mass effect on the left atrium. Findings concerning for malignancy.  Mass or consolidation in the left lower lobe demonstrates heterogenous hypodensities suggesting necrosis. Pulmonary consultation is recommended. 3. Additional finding of heterogenous enhancing left lower lobe mass measuring up to 7 cm either abutting or arising from the pleural surface, concerning for metastatic lesion. Suspicion of additional probable pleural based mass slightly more superior at the level of the distal arch. Critical Value/emergent results were called by telephone at the time of interpretation on 10/23/2021 at 6:54 pm to provider [facility] , who verbally acknowledged these results. Aortic Atherosclerosis (ICD10-I70.0) and Emphysema (ICD10-J43.9). Electronically Signed   By: Donavan Foil M.D.   On: 10/23/2021 18:54   CT HEAD WO CONTRAST (5MM)  Result Date: 10/23/2021 CLINICAL DATA:  Fall EXAM: CT HEAD WITHOUT CONTRAST CT CERVICAL SPINE WITHOUT CONTRAST TECHNIQUE: Multidetector CT imaging of the head and cervical spine was performed following the standard protocol without intravenous contrast. Multiplanar CT image reconstructions of the cervical spine were also generated. RADIATION DOSE REDUCTION: This exam was performed according to the departmental dose-optimization program which includes automated exposure control, adjustment of the mA and/or kV according to patient size and/or use of iterative reconstruction technique. COMPARISON:  Brain MRI 07/02/2017 and cervical spine CT 03/16/2016 FINDINGS: CT HEAD FINDINGS Brain: There is no acute intracranial hemorrhage, extra-axial fluid collection, or acute infarct. Background parenchymal volume is normal for age. The ventricles are normal in size. Gray-white differentiation is preserved. There is no mass lesion. There is no mass effect or midline shift. Vascular: There is calcification of the bilateral carotid siphons. Skull: Normal. Negative for fracture or focal lesion. Sinuses/Orbits: The imaged paranasal sinuses are clear. Bilateral lens  implants are in place. The globes and orbits are otherwise unremarkable. Other: None. CT CERVICAL SPINE FINDINGS Alignment: Trace anterolisthesis of C3 on C4 and C4 on C5 and trace retrolisthesis of C5 on C6 is unchanged since 2018. There is no jumped or perched facet or other evidence of traumatic malalignment. Skull base and vertebrae: Skull base alignment is maintained. Vertebral body heights are preserved. There is no evidence of acute fracture. There is no suspicious osseous lesion. Soft tissues and spinal canal: No prevertebral fluid or swelling. No visible canal hematoma. Disc levels: Disc space narrowing and degenerative endplate change most advanced at C5-C6. Facet arthropathy is most advanced at C2-C3 bilaterally and on the left at C3-C4. Upper chest: The lungs are assessed on the separately dictated CTA chest. Other: None. IMPRESSION: 1. No acute intracranial pathology. 2. No acute fracture or traumatic malalignment of the cervical spine. Electronically Signed   By: Valetta Mole M.D.   On: 10/23/2021 18:28   CT Cervical Spine Wo Contrast  Result Date: 10/23/2021 CLINICAL DATA:  Fall EXAM: CT HEAD WITHOUT CONTRAST CT CERVICAL SPINE WITHOUT CONTRAST TECHNIQUE: Multidetector CT imaging  of the head and cervical spine was performed following the standard protocol without intravenous contrast. Multiplanar CT image reconstructions of the cervical spine were also generated. RADIATION DOSE REDUCTION: This exam was performed according to the departmental dose-optimization program which includes automated exposure control, adjustment of the mA and/or kV according to patient size and/or use of iterative reconstruction technique. COMPARISON:  Brain MRI 07/02/2017 and cervical spine CT 03/16/2016 FINDINGS: CT HEAD FINDINGS Brain: There is no acute intracranial hemorrhage, extra-axial fluid collection, or acute infarct. Background parenchymal volume is normal for age. The ventricles are normal in size. Gray-white  differentiation is preserved. There is no mass lesion. There is no mass effect or midline shift. Vascular: There is calcification of the bilateral carotid siphons. Skull: Normal. Negative for fracture or focal lesion. Sinuses/Orbits: The imaged paranasal sinuses are clear. Bilateral lens implants are in place. The globes and orbits are otherwise unremarkable. Other: None. CT CERVICAL SPINE FINDINGS Alignment: Trace anterolisthesis of C3 on C4 and C4 on C5 and trace retrolisthesis of C5 on C6 is unchanged since 2018. There is no jumped or perched facet or other evidence of traumatic malalignment. Skull base and vertebrae: Skull base alignment is maintained. Vertebral body heights are preserved. There is no evidence of acute fracture. There is no suspicious osseous lesion. Soft tissues and spinal canal: No prevertebral fluid or swelling. No visible canal hematoma. Disc levels: Disc space narrowing and degenerative endplate change most advanced at C5-C6. Facet arthropathy is most advanced at C2-C3 bilaterally and on the left at C3-C4. Upper chest: The lungs are assessed on the separately dictated CTA chest. Other: None. IMPRESSION: 1. No acute intracranial pathology. 2. No acute fracture or traumatic malalignment of the cervical spine. Electronically Signed   By: Valetta Mole M.D.   On: 10/23/2021 18:28   DG Chest 2 View  Result Date: 10/23/2021 CLINICAL DATA:  Trauma, fall, difficulty breathing EXAM: CHEST - 2 VIEW COMPARISON:  02/07/2020 FINDINGS: Transplant diameter heart is increased. There is almost complete opacification of left hemithorax. Findings suggest large left pleural effusion. Evaluation of right lung for infiltrates is limited by the large effusion. In view of history of fall, possibility of hemothorax is not excluded. Right lung is clear. Right lateral CP angle is clear. There is no pneumothorax. There is evidence of previous vertebroplasty in lower thoracic spine. Degenerative changes are noted in  both shoulders. IMPRESSION: There is interval appearance of large left pleural effusion. This may suggest parapneumonic effusion or hemothorax or neoplastic process. Follow-up CT chest may be considered. Electronically Signed   By: Elmer Picker M.D.   On: 10/23/2021 16:12    Labs: BNP (last 3 results) Recent Labs    10/25/21 1048  BNP 737.1*   Basic Metabolic Panel: Recent Labs  Lab 10/30/21 0521 10/31/21 0422 11/01/21 0458 11/02/21 0415 11/03/21 0500  NA 137 134* 135 136 135  K 3.0* 3.6 3.5 4.2 4.2  CL 102 100 101 104 104  CO2 '30 30 27 28 27  '$ GLUCOSE 95 85 64* 91 89  BUN '16 15 16 18 18  '$ CREATININE 0.78 0.73 0.82 0.96 1.16*  CALCIUM 7.4* 7.4* 7.5* 7.6* 7.6*  MG 1.4* 2.0 1.8  --   --   PHOS  --  2.6  --   --   --    Liver Function Tests: No results for input(s): "AST", "ALT", "ALKPHOS", "BILITOT", "PROT", "ALBUMIN" in the last 168 hours. No results for input(s): "LIPASE", "AMYLASE" in the last 168 hours.  No results for input(s): "AMMONIA" in the last 168 hours. CBC: Recent Labs  Lab 10/30/21 0521 10/31/21 0422 11/01/21 0458 11/02/21 0415 11/03/21 0500  WBC 13.7* 13.0* 12.9* 13.0* 14.2*  NEUTROABS  --  9.6* 9.6*  --   --   HGB 11.1* 10.8* 11.1* 9.8* 9.1*  HCT 34.6* 33.6* 35.0* 30.8* 29.3*  MCV 88.0 88.0 88.8 90.3 90.2  PLT 374 370 380 376 369   Cardiac Enzymes: No results for input(s): "CKTOTAL", "CKMB", "CKMBINDEX", "TROPONINI" in the last 168 hours. BNP: Invalid input(s): "POCBNP" CBG: Recent Labs  Lab 11/02/21 1233 11/02/21 1619 11/02/21 2309 11/03/21 0520 11/03/21 1151  GLUCAP 116* 118* 84 82 111*   D-Dimer No results for input(s): "DDIMER" in the last 72 hours. Hgb A1c No results for input(s): "HGBA1C" in the last 72 hours. Lipid Profile No results for input(s): "CHOL", "HDL", "LDLCALC", "TRIG", "CHOLHDL", "LDLDIRECT" in the last 72 hours. Thyroid function studies No results for input(s): "TSH", "T4TOTAL", "T3FREE", "THYROIDAB" in the last  72 hours.  Invalid input(s): "FREET3" Anemia work up No results for input(s): "VITAMINB12", "FOLATE", "FERRITIN", "TIBC", "IRON", "RETICCTPCT" in the last 72 hours. Urinalysis    Component Value Date/Time   COLORURINE YELLOW 10/24/2021 0600   APPEARANCEUR CLEAR 10/24/2021 0600   LABSPEC >1.046 (H) 10/24/2021 0600   PHURINE 5.0 10/24/2021 0600   GLUCOSEU NEGATIVE 10/24/2021 0600   HGBUR NEGATIVE 10/24/2021 0600   BILIRUBINUR NEGATIVE 10/24/2021 0600   KETONESUR 20 (A) 10/24/2021 0600   PROTEINUR NEGATIVE 10/24/2021 0600   NITRITE NEGATIVE 10/24/2021 0600   LEUKOCYTESUR SMALL (A) 10/24/2021 0600   Sepsis Labs Recent Labs  Lab 10/31/21 0422 11/01/21 0458 11/02/21 0415 11/03/21 0500  WBC 13.0* 12.9* 13.0* 14.2*   Microbiology No results found for this or any previous visit (from the past 240 hour(s)).   Time coordinating discharge: 35 minutes  SIGNED: Antonieta Pert, MD  [facility] 11/05/2021, 10:25 AM  If 7PM-7AM, please contact night-coverage [email or web address]

## 2021-11-05 NOTE — Care Management Important Message (Signed)
Important Message  Patient Details IM Letter given placed in Patients room. Name: [name] MRN: [id number] Date of Birth: 23-Jun-1934   Medicare Important Message Given:  Yes     Kerin Salen 11/05/2021, 10:45 AM

## 2021-11-05 NOTE — Progress Notes (Signed)
   Palliative Medicine Inpatient Follow Up Note  HPI: 86 yo with critical aortic stenosis,refused interventions historically and has been increasing symptomatic over the past year with frequent falls worsening functional status. She is a resident in independent living at [facility]. She was admitted 9/6 sp fall and found to have very large pleural effusion and a newly discovered lung mass although fluid cytology was not diagnostic. She currently has a chest tube placed in the ICU and has required very careful volume management.   Palliative was requested for goals of care.  Today's Discussion 11/05/2021  *Please note that this is a verbal dictation therefore any spelling or grammatical errors are due to the "[place]" system interpretation.  Chart reviewed inclusive of vital signs, progress notes, laboratory results, and diagnostic images. Reviewed IV medications inclusive of fentanyl.  I met with [name] at bedside this late morning. We reviewed her present clinical state in the setting of her aortic stenosis and her prolonged hospitalization. She is aware of the ongoing plan for hospice care. She shares that she has some pain this morning due to the positioning of her bed. I was able to help reposition her which provided some relief.   [name] [facility] We reviewed that this should happen, ideally at some point in the afternoon.   Questions and concerns addressed/Palliative Support Provided.   Objective Assessment: Vital Signs Vitals:   11/04/21 2216 11/05/21 0448  BP: (!) 117/57 (!) 141/71  Pulse: 93 87  Resp:  18  Temp:  97.9 F (36.6 C)  SpO2:  94%    Intake/Output Summary (Last 24 hours) at 11/05/2021 1150 Last data filed at 11/05/2021 8325 Gross per 24 hour  Intake 473 ml  Output --  Net 473 ml   Last Weight  Most recent update: 11/03/2021  5:58 AM    Weight  64.1 kg (141 lb 5 oz)            Gen:  Elderly  Caucasian F in NAD HEENT: moist mucous membranes CV: Regular rate and rhythm, no murmurs rubs or gallops PULM: ON 3LPM [place], breathing even and nonlabored ABD: soft/nontender EXT: No edema Neuro: Alert and oriented x3  SUMMARY OF RECOMMENDATIONS   DNAR/DNI  Comfort Care - Medications reviewed  Plan for transition to [place] this afternoon with Hospice care  Billing based on MDM: High  Problems Addressed: One acute or chronic illness or injury that poses a threat to life or bodily function  Amount and/or Complexity of Data: Category 3:Discussion of management or test interpretation with external physician/other qualified health care professional/appropriate source (not separately reported)  Risks: Parenteral controlled substances and Decision not to resuscitate or to de-escalate care because of poor prognosis ______________________________________________________________________________________ [facility] Team Team Cell Phone: [phone] Please utilize secure chat with additional questions, if there is no response within 30 minutes please call the above phone number  Palliative Medicine Team providers are available by phone from 7am to 7pm daily and can be reached through the team cell phone.  Should this patient require assistance outside of these hours, please call the patient's attending physician.

## 2021-11-06 ENCOUNTER — Non-Acute Institutional Stay (SKILLED_NURSING_FACILITY): Payer: Medicare PPO | Admitting: Adult Health

## 2021-11-06 ENCOUNTER — Encounter: Payer: Self-pay | Admitting: Adult Health

## 2021-11-06 DIAGNOSIS — J90 Pleural effusion, not elsewhere classified: Secondary | ICD-10-CM

## 2021-11-06 DIAGNOSIS — I10 Essential (primary) hypertension: Secondary | ICD-10-CM

## 2021-11-06 DIAGNOSIS — R918 Other nonspecific abnormal finding of lung field: Secondary | ICD-10-CM | POA: Diagnosis not present

## 2021-11-06 DIAGNOSIS — J9601 Acute respiratory failure with hypoxia: Secondary | ICD-10-CM | POA: Diagnosis not present

## 2021-11-06 DIAGNOSIS — E039 Hypothyroidism, unspecified: Secondary | ICD-10-CM | POA: Diagnosis not present

## 2021-11-06 DIAGNOSIS — K5901 Slow transit constipation: Secondary | ICD-10-CM

## 2021-11-06 DIAGNOSIS — I480 Paroxysmal atrial fibrillation: Secondary | ICD-10-CM | POA: Diagnosis not present

## 2021-11-06 LAB — CYTOLOGY - NON PAP

## 2021-11-06 NOTE — Progress Notes (Signed)
Location:  [place] Room Number: 42-A Place of Service:  SNF (31) Provider:  Durenda Age, DNP, FNP-BC  Patient Care Team: Tisovec, Fransico Him, MD as PCP - General (Internal Medicine) Jerline Pain, MD as PCP - Cardiology (Cardiology) Deneise Lever, MD (Pulmonary Disease)  Extended Emergency Contact Information Primary Emergency Contact: Janeann Forehand Address: [place], [place] Phone: [phone] Mobile Phone: [phone] Relation: Daughter  Code Status:  DNR  Goals of care: Advanced Directive information    11/06/2021   10:47 AM  Advanced Directives  Does Patient Have a Medical Advance Directive? Yes  Type of Advance Directive Out of facility DNR (pink MOST or yellow form)  Does patient want to make changes to medical advance directive? No - Patient declined     Chief Complaint  Patient presents with   Hospitalization Follow-up    Follow up Hospitalization.     HPI:  Pt is a 87 y.o. female seen today for follow up of hospitalization. She was hospitalized 10/23/21 to 11/05/21.  She has a PMH of COPD, OSA on CPAP, hypertension, diabetes mellitus, hyperlipidemia, history of aortic atherosclerosis, possible chronic descending aortic dissection and history of renal artery bypass, history pituitary adenoma s/p resection 2004, 2009 and 2010, history of CPPD, OA and chronic low back pain.  She was brought to ED due to weakness been a fall incident and was found to be hypotensive by EMS.  In the ER, chest x-ray showed new large left effusion, CTA chest confirmed large left-sided effusion with consolidative mass.  She developed A-fib with RVR on 9/6.  She was transferred to ICU and chest tube placed.  She became hypotensive requiring Neo-Synephrine initiation.  Repeat chest x-ray showed persistent loculated pleural effusion.  Follow-up CT of the chest with moderate to large sized loculated  left pleural effusion has only minimally decreased in size s/p pleural drainage catheter placement.  Fibrinolytic administered on 10/31/2021, pigtail catheter removed 9/17.  Family meeting was held and patient/daughter elected comfort measures. She was discharged to [facility] with hospice care.  She was seen in the room today. She complained of constipation. It was reported that she has O2 saturation of 88% with O2 3L/min. Increased O2 to 4L/min which increased  O2 sat to 90%. She talked about starting to smoke cigarettes since her college days till 1999. She denies having pain.         Past Medical History:  Diagnosis Date   Allergic rhinitis    Arthritis    Complication of anesthesia    Hard time waking up   COPD (chronic obstructive pulmonary disease) ([place])    PFT 08/08/09-FEV1 1.98/107; R 0.77; small airway obst w/tresp to dilator;DLCO 44%   Diabetes mellitus    Fibromuscular dysplasia (HCC)    RAS   Hyperlipidemia    Hypertension    PONV (postoperative nausea and vomiting)    Pseudogout    Sleep apnea    AHI 89/hr   Past Surgical History:  Procedure Laterality Date   BACK SURGERY  2005   bilateral renal bypass     ESOPHAGOGASTRODUODENOSCOPY N/A 10/01/2012   Procedure: ESOPHAGOGASTRODUODENOSCOPY (EGD);  Surgeon: Missy Sabins, MD;  Location: [facility] ENDOSCOPY;  Service: Endoscopy;  Laterality: N/A;   nasal septal deviation     pituitary tumor removed     x 2   TONSILLECTOMY  Allergies  Allergen Reactions   Valacyclovir Hcl Rash    Outpatient Encounter Medications as of 11/06/2021  Medication Sig   acetaminophen (TYLENOL) 325 MG tablet Take 325-650 mg by mouth every 6 (six) hours as needed for headache or mild pain (CANNOT EXCEED A SUM TOTAL OF 3,000 MG/DAY- FROM ALL COMBINED SOURCES and phone MD, if no relief).   bisacodyl (DULCOLAX) 10 MG suppository Place 10 mg rectally as needed for moderate constipation.   levothyroxine (SYNTHROID) 50 MCG tablet Take 50  mcg by mouth daily.   melatonin 3 MG TABS tablet Take 1 tablet (3 mg total) by mouth at bedtime.   metoprolol tartrate (LOPRESSOR) 25 MG tablet Take 0.5 tablets (12.5 mg total) by mouth 2 (two) times daily.   polyvinyl alcohol (LIQUIFILM TEARS) 1.4 % ophthalmic solution Place 1 drop into both eyes as needed for dry eyes.   Sodium Phosphates (RA SALINE ENEMA RE) Place 1 enema rectally as needed.   [DISCONTINUED] fexofenadine (ALLEGRA) 180 MG tablet Take 180 mg by mouth daily.   No facility-administered encounter medications on file as of 11/06/2021.    Review of Systems  Constitutional:  Negative for appetite change, chills, fatigue and fever.  HENT:  Negative for congestion, hearing loss, rhinorrhea and sore throat.   Eyes: Negative.   Respiratory:  Negative for cough, shortness of breath and wheezing.   Cardiovascular:  Positive for leg swelling. Negative for chest pain and palpitations.  Gastrointestinal:  Positive for constipation. Negative for abdominal pain, diarrhea, nausea and vomiting.  Genitourinary:  Negative for dysuria.  Musculoskeletal:  Negative for arthralgias, back pain and myalgias.  Skin:  Negative for color change, rash and wound.  Neurological:  Negative for dizziness, weakness and headaches.  Psychiatric/Behavioral:  Negative for behavioral problems. The patient is not nervous/anxious.        Immunization History  Administered Date(s) Administered   Influenza Split 10/19/2010   Influenza-Unspecified 01/18/2020   Moderna Sars-Covid-2 Vaccination 04/30/2019, 05/28/2019, 01/25/2020   PPD Test 03/18/2016   Pertinent  Health Maintenance Due  Topic Date Due   FOOT EXAM  Never done   OPHTHALMOLOGY EXAM  Never done   DEXA SCAN  Never done   HEMOGLOBIN A1C  08/07/2020   INFLUENZA VACCINE  09/17/2021      11/03/2021    8:00 PM 11/04/2021    7:54 AM 11/04/2021    8:00 AM 11/04/2021    7:45 PM 11/05/2021   11:00 AM  Fall Risk  Patient Fall Risk Level High fall risk  High fall risk High fall risk High fall risk High fall risk     Vitals:   11/06/21 1045  BP: 130/70  Pulse: 83  Resp: 17  Temp: 98.3 F (36.8 C)  SpO2: 94%  Height: '5\' 3"'$  (1.6 m)   Body mass index is 25.03 kg/m.  Physical Exam Constitutional:      Appearance: Normal appearance.  HENT:     Head: Normocephalic and atraumatic.     Nose: Nose normal.     Mouth/Throat:     Mouth: Mucous membranes are moist.  Eyes:     Conjunctiva/sclera: Conjunctivae normal.  Cardiovascular:     Rate and Rhythm: Normal rate and regular rhythm.  Pulmonary:     Effort: Pulmonary effort is normal.     Breath sounds: Normal breath sounds.  Abdominal:     General: Bowel sounds are normal.     Palpations: Abdomen is soft.  Musculoskeletal:  General: Normal range of motion.     Cervical back: Normal range of motion.     Right lower leg: Edema present.     Left lower leg: Edema present.     Comments: BLE 2+edema.  Skin:    General: Skin is warm and dry.     Comments: Has dressing on Left lateral chest and left upper back (chest tube placements).  Neurological:     General: No focal deficit present.     Mental Status: She is alert and oriented to person, place, and time.  Psychiatric:        Mood and Affect: Mood normal.        Behavior: Behavior normal.        Thought Content: Thought content normal.        Judgment: Judgment normal.        Labs reviewed: Recent Labs    10/28/21 0440 10/29/21 0443 10/29/21 0525 10/30/21 0521 10/31/21 0422 11/01/21 0458 11/02/21 0415 11/03/21 0500  NA 141   < >  --  137 134* 135 136 135  K 3.6   < >  --  3.0* 3.6 3.5 4.2 4.2  CL 109   < >  --  102 100 101 104 104  CO2 27   < >  --  '30 30 27 28 27  '$ GLUCOSE 120*   < >  --  95 85 64* 91 89  BUN 30*   < >  --  '16 15 16 18 18  '$ CREATININE 1.12*   < >  --  0.78 0.73 0.82 0.96 1.16*  CALCIUM 8.0*   < >  --  7.4* 7.4* 7.5* 7.6* 7.6*  MG 1.7  --  1.9 1.4* 2.0 1.8  --   --   PHOS 2.2*  --   3.4  --  2.6  --   --   --    < > = values in this interval not displayed.   Recent Labs    10/26/21 0422 10/27/21 1021 10/28/21 0440  AST 12* 18 31  ALT '7 9 15  '$ ALKPHOS 112 101 115  BILITOT 0.7 0.4 0.5  PROT 5.3* 4.8* 5.1*  ALBUMIN 2.3* 2.0* 2.0*   Recent Labs    10/23/21 1545 10/24/21 0008 10/31/21 0422 11/01/21 0458 11/02/21 0415 11/03/21 0500  WBC 10.3   < > 13.0* 12.9* 13.0* 14.2*  NEUTROABS 8.3*  --  9.6* 9.6*  --   --   HGB 13.6   < > 10.8* 11.1* 9.8* 9.1*  HCT 43.4   < > 33.6* 35.0* 30.8* 29.3*  MCV 89.9   < > 88.0 88.8 90.3 90.2  PLT 572*   < > 370 380 376 369   < > = values in this interval not displayed.   Lab Results  Component Value Date   TSH 0.034 (L) 10/24/2021   Lab Results  Component Value Date   HGBA1C 5.3 02/07/2020   No results found for: "CHOL", "HDL", "LDLCALC", "LDLDIRECT", "TRIG", "CHOLHDL"  Significant Diagnostic Results in last 30 days:  DG Chest Port 1 View  Result Date: 11/04/2021 CLINICAL DATA:  Left pleural effusion. EXAM: PORTABLE CHEST 1 VIEW COMPARISON:  11/02/2021. FINDINGS: The heart size and mediastinal contours are stable. There is atherosclerotic calcification of the aorta. There is a moderate loculated pleural effusion on the left and small pleural effusion on the right. Mild atelectasis is noted in the lungs bilaterally. No pneumothorax. A right PICC line  terminates over the superior vena cava. Vertebroplasty changes are noted in the lower thoracic spine. IMPRESSION: Moderate loculated pleural effusion on the left and small pleural effusion on the right with likely associated compressive atelectasis. Electronically Signed   By: Brett Fairy M.D.   On: 11/04/2021 04:54   DG CHEST PORT 1 VIEW  Result Date: 11/02/2021 CLINICAL DATA:  Respiratory failure. EXAM: PORTABLE CHEST 1 VIEW COMPARISON:  11/01/2021 FINDINGS: Right arm PICC line tip is stable at the level of the cavoatrial junction. Left basilar chest tube is again noted. No  significant pneumothorax visualized. Lung volumes are low. Bilateral pleural effusions are again noted and appear unchanged from the previous exam. Atelectasis within both lung bases. IMPRESSION: 1. Stable support apparatus. 2. No change in aeration to the lungs compared with previous exam. 3. Stable bilateral pleural effusions and bibasilar atelectasis. Electronically Signed   By: Kerby Moors M.D.   On: 11/02/2021 09:00   DG Chest Port 1 View  Result Date: 11/01/2021 CLINICAL DATA:  Respiratory failure and left chest tube for pleural effusion. EXAM: PORTABLE CHEST 1 VIEW COMPARISON:  10/31/2021 chest x-ray and CT of the chest on 10/30/2021 FINDINGS: Left basilar pigtail chest tube remains in place with interval significant reduction in pleural fluid volume. Resultant improved aeration of the left lung with persistent opacity in the left hilar region and left lower lung. Underlying mass again cannot be excluded. Stable small right pleural effusion. Right upper extremity PICC line tip in lower SVC. No pneumothorax. IMPRESSION: Significant reduction in left pleural fluid volume with resultant improved aeration of the left lung. Persistent opacity in the left hilar region and left lower lung. Underlying mass again cannot be excluded. Electronically Signed   By: Aletta Edouard M.D.   On: 11/01/2021 08:07   DG Chest Port 1 View  Result Date: 10/31/2021 CLINICAL DATA:  Chest tube in place. EXAM: PORTABLE CHEST 1 VIEW COMPARISON:  October 30, 2021. FINDINGS: Stable cardiomediastinal silhouette. Right-sided PICC line is in good position. Continued presence of left basilar chest tube. Stable loculated left pleural effusion is noted in the left upper lobe region. Stable left pleural effusion is noted with probable associated atelectasis. Bony thorax is unremarkable. IMPRESSION: Stable position of left-sided chest tube with associated left pleural effusion and associated atelectasis. Continued presence of  loculated pleural effusion seen in left upper lobe. Electronically Signed   By: Marijo Conception M.D.   On: 10/31/2021 13:07   CT CHEST WO CONTRAST  Result Date: 10/30/2021 CLINICAL DATA:  Pleural effusion.  Malignancy suspected. EXAM: CT CHEST WITHOUT CONTRAST TECHNIQUE: Multidetector CT imaging of the chest was performed following the standard protocol without IV contrast. RADIATION DOSE REDUCTION: This exam was performed according to the departmental dose-optimization program which includes automated exposure control, adjustment of the mA and/or kV according to patient size and/or use of iterative reconstruction technique. COMPARISON:  Chest x-ray 10/30/2021. CT abdomen and pelvis 10/25/2021. CT angiogram chest 10/23/2021 FINDINGS: Cardiovascular: Aorta is normal in size. There are atherosclerotic calcifications of the aorta and coronary arteries. The heart is normal in size. There is no pericardial effusion. Right upper extremity PICC terminates in the SVC. Mediastinum/Nodes: There is an enlarged subcarinal lymph node measuring 1.9 by 3.0 cm. This is similar to prior study. Thyroid gland, trachea, and esophagus demonstrate no significant findings. Difficult to assess for left hilar adenopathy secondary to lack of contrast. Lungs/Pleura: There is a moderate-to-large sized left pleural effusion which is partially loculated. This is only  minimally decreased in size. New left pleural drainage catheter is in place in the anterior lower hemithorax. There is no pneumothorax. There is compressive atelectasis of the left upper lobe and left lower lobe. Underlying left lower lobe mass would be difficult to exclude. Faint masslike opacity is seen in the lower posterior left pleural space measuring 3.5 x 7.6 cm image 2/122. There is a small layering right pleural effusion. There is atelectasis of the right lower lobe. Mild emphysematous changes are present. Upper Abdomen: No acute abnormality. Musculoskeletal: Mild  compression deformity of T3 appears chronic and unchanged. Vertebroplasty changes at T11 are unchanged. No acute fracture or focal osseous lesion identified. IMPRESSION: 1. Moderate-to-large size loculated left pleural effusion has only minimally decreased in size status post pleural drainage catheter placement. 2. There continues to be compressive atelectasis of the left lower lobe. An underlying left lower lobe mass can not be excluded. 3. Unchanged pleural-based masslike density in the inferior left hemithorax measuring 3.5 x 7.6 cm. Malignancy not excluded. 4. Stable subcarinal pathologic lymphadenopathy. 5. Small right pleural effusion is new from prior. 6. Aortic Atherosclerosis (ICD10-I70.0) and Emphysema (ICD10-J43.9). Electronically Signed   By: Ronney Asters M.D.   On: 10/30/2021 21:39   DG Chest Port 1 View  Result Date: 10/30/2021 CLINICAL DATA:  Increased shortness of breath this morning EXAM: PORTABLE CHEST 1 VIEW COMPARISON:  Portable exam 0817 hours compared to 10/29/2021 FINDINGS: LEFT thoracostomy tube stable. RIGHT arm PICC line tip projects over SVC. Normal heart size, mediastinal contours, and pulmonary vascularity. Atherosclerotic calcification aorta. Persistent partially loculated moderate-sized LEFT pleural effusion and basilar atelectasis. Mild RIGHT basilar atelectasis as well. No pneumothorax or acute osseous findings. IMPRESSION: Persistent partially loculated LEFT pleural effusion and LEFT lung atelectasis. Subsegmental atelectasis RIGHT base. Electronically Signed   By: Lavonia Dana M.D.   On: 10/30/2021 08:41   DG CHEST PORT 1 VIEW  Result Date: 10/29/2021 CLINICAL DATA:  Pleural effusion in a female at age 79. EXAM: PORTABLE CHEST 1 VIEW COMPARISON:  October 27, 2021. FINDINGS: EKG leads project over the chest. LEFT-sided chest tube remains in place, small bore tube. Since the previous exam there is increased pleural fluid in the LEFT chest along the lateral chest and  extending to the apex. This shows a loculated appearance and while of smaller volume than the study of October 23, 2021 is increased since the most recent exam. Cardiomediastinal contours and hilar structures grossly stable though largely obscured in the LEFT chest due to increase in pleural fluid. Graded opacity also suggested at the RIGHT lung base. No visible pneumothorax. On limited assessment no acute skeletal process with RIGHT-sided PICC line in place terminating at the caval to atrial junction. IMPRESSION: 1. Increasing pleural fluid along the lateral and apical chest showing slow increase over a series of recent comparison studies. 2. Chest tube remaining in place, similar position. 3. Likely small effusion at the RIGHT lung base. 4. No visible pneumothorax. Electronically Signed   By: Zetta Bills M.D.   On: 10/29/2021 08:21   DG CHEST PORT 1 VIEW  Result Date: 10/27/2021 CLINICAL DATA:  Left-sided chest tube. EXAM: PORTABLE CHEST 1 VIEW COMPARISON:  10/25/2021 FINDINGS: Right-sided PICC line unchanged with tip over the SVC. Left basilar chest tube unchanged. No evidence of left-sided pneumothorax. Stable opacification over the left lung base likely small effusion with associated atelectasis. Slight worsening hazy attenuation over the left mid to lower lung which may be due to layering pleural fluid and less  likely asymmetric edema/infection. Right lung is clear. Cardiomediastinal silhouette and remainder of the exam is unchanged. IMPRESSION: 1. Stable left base opacification likely small effusion with associated atelectasis. Slight worsening hazy attenuation over the left mid to lower lung which may be due to layering pleural fluid and less likely asymmetric edema/infection. 2. Tubes and lines as described. Electronically Signed   By: Marin Olp M.D.   On: 10/27/2021 10:03   DG CHEST PORT 1 VIEW  Result Date: 10/25/2021 CLINICAL DATA:  Pleural effusion history of COPD EXAM: PORTABLE CHEST 1  VIEW COMPARISON:  10/25/2021, 10/24/2021, 10/23/2021, CT chest 10/23/2021 FINDINGS: Left-sided chest tube remains in place. Residual small moderate left effusion and small right effusion. Persistent consolidation and airspace disease at left base. Left hilar enlargement no discrete left pneumothorax. Right upper extremity central venous catheter tip at the cavoatrial junction. IMPRESSION: 1. Similar small moderate left effusion and airspace disease at left lung base. 2. Trace right pleural effusion Electronically Signed   By: Donavan Foil M.D.   On: 10/25/2021 18:08   ECHOCARDIOGRAM COMPLETE  Result Date: 10/25/2021    ECHOCARDIOGRAM REPORT   Patient Name:   TABBETHA KUTSCHER Date of Exam: 10/25/2021 Medical Rec #:  [id number]            Height:       63.0 in Accession #:    [id number]           Weight:       134.9 lb Date of Birth:  1934-08-08            BSA:          1.636 m Patient Age:    57 years             BP:           122/53 mmHg Patient Gender: F                    HR:           86 bpm. Exam Location:  Inpatient Procedure: 2D Echo and Intracardiac Opacification Agent Indications:    AFIB  History:        Patient has prior history of Echocardiogram examinations, most                 recent 06/03/2016. Arrythmias:Atrial Fibrillation; Risk                 Factors:Hypertension and Diabetes.  Sonographer:    Harvie Junior Referring Phys: [phone] MURALI RAMASWAMY  Sonographer Comments: Technically difficult study due to poor echo windows, suboptimal apical window and suboptimal parasternal window. Image acquisition challenging due to COPD, Image acquisition challenging due to respiratory motion and patient supine. IMPRESSIONS  1. Calcified aortic valve with concern for low flow, low gradient severe AS (mean gradient 22 mmHg, AVA 0.7 cm2 and DI 0.24).  2. Left ventricular ejection fraction, by estimation, is >75%. The left ventricle has hyperdynamic function. The left ventricle has no regional wall motion  abnormalities. There is moderate concentric left ventricular hypertrophy. Left ventricular diastolic parameters are consistent with Grade I diastolic dysfunction (impaired relaxation).  3. Right ventricular systolic function is normal. The right ventricular size is normal. There is mildly elevated pulmonary artery systolic pressure.  4. The mitral valve is normal in structure. Trivial mitral valve regurgitation. No evidence of mitral stenosis.  5. The aortic valve is calcified. Aortic valve regurgitation is trivial. Severe aortic valve stenosis.  6. The  inferior vena cava is normal in size with greater than 50% respiratory variability, suggesting right atrial pressure of 3 mmHg. Comparison(s): No prior Echocardiogram. FINDINGS  Left Ventricle: Left ventricular ejection fraction, by estimation, is >75%. The left ventricle has hyperdynamic function. The left ventricle has no regional wall motion abnormalities. Definity contrast agent was given IV to delineate the left ventricular endocardial borders. The left ventricular internal cavity size was normal in size. There is moderate concentric left ventricular hypertrophy. Left ventricular diastolic parameters are consistent with Grade I diastolic dysfunction (impaired relaxation). Right Ventricle: The right ventricular size is normal. Right ventricular systolic function is normal. There is mildly elevated pulmonary artery systolic pressure. The tricuspid regurgitant velocity is 3.01 m/s, and with an assumed right atrial pressure of 8 mmHg, the estimated right ventricular systolic pressure is 78.2 mmHg. Left Atrium: Left atrial size was normal in size. Right Atrium: Right atrial size was normal in size. Pericardium: There is no evidence of pericardial effusion. Mitral Valve: The mitral valve is normal in structure. Mild mitral annular calcification. Trivial mitral valve regurgitation. No evidence of mitral valve stenosis. Tricuspid Valve: The tricuspid valve is normal in  structure. Tricuspid valve regurgitation is mild . No evidence of tricuspid stenosis. Aortic Valve: The aortic valve is calcified. Aortic valve regurgitation is trivial. Aortic regurgitation PHT measures 513 msec. Severe aortic stenosis is present. Aortic valve mean gradient measures 21.5 mmHg. Aortic valve peak gradient measures 34.0 mmHg. Aortic valve area, by VTI measures 0.74 cm. Pulmonic Valve: The pulmonic valve was normal in structure. Pulmonic valve regurgitation is not visualized. No evidence of pulmonic stenosis. Aorta: The aortic root is normal in size and structure. Venous: The inferior vena cava is normal in size with greater than 50% respiratory variability, suggesting right atrial pressure of 3 mmHg. IAS/Shunts: No atrial level shunt detected by color flow Doppler. Additional Comments: Calcified aortic valve with concern for low flow, low gradient severe AS (mean gradient 22 mmHg, AVA 0.7 cm2 and DI 0.24).  LEFT VENTRICLE PLAX 2D LVIDd:         2.25 cm     Diastology LVIDs:         1.50 cm     LV e' medial:    4.03 cm/s LV PW:         1.20 cm     LV E/e' medial:  13.3 LV IVS:        1.20 cm     LV e' lateral:   3.59 cm/s LVOT diam:     2.00 cm     LV E/e' lateral: 14.9 LV SV:         32 LV SV Index:   19 LVOT Area:     3.14 cm  LV Volumes (MOD) LV vol d, MOD A2C: 26.3 ml LV vol d, MOD A4C: 35.7 ml LV vol s, MOD A2C: 4.7 ml LV vol s, MOD A4C: 8.9 ml LV SV MOD A2C:     21.6 ml LV SV MOD A4C:     35.7 ml LV SV MOD BP:      24.2 ml RIGHT VENTRICLE RV S prime:     6.64 cm/s TAPSE (M-mode): 1.0 cm LEFT ATRIUM             Index LA diam:        2.60 cm 1.59 cm/m LA Vol (A2C):   17.7 ml 10.82 ml/m LA Vol (A4C):   18.5 ml 11.31 ml/m LA Biplane Vol: 18.0 ml  11.00 ml/m  AORTIC VALVE                     PULMONIC VALVE AV Area (Vmax):    0.67 cm      PV Vmax:       2.31 m/s AV Area (Vmean):   0.66 cm      PV Peak grad:  21.3 mmHg AV Area (VTI):     0.74 cm AV Vmax:           291.75 cm/s AV Vmean:           189.333 cm/s AV VTI:            0.428 m AV Peak Grad:      34.0 mmHg AV Mean Grad:      21.5 mmHg LVOT Vmax:         62.10 cm/s LVOT Vmean:        39.800 cm/s LVOT VTI:          0.101 m LVOT/AV VTI ratio: 0.24 AI PHT:            513 msec  AORTA Ao Root diam: 3.00 cm Ao Asc diam:  3.00 cm MITRAL VALVE               TRICUSPID VALVE MV Area (PHT): 3.63 cm    TR Peak grad:   36.2 mmHg MV Decel Time: 209 msec    TR Vmax:        301.00 cm/s MR Peak grad: 31.8 mmHg MR Vmax:      282.00 cm/s  SHUNTS MV E velocity: 53.60 cm/s  Systemic VTI:  0.10 m MV A velocity: 60.40 cm/s  Systemic Diam: 2.00 cm MV E/A ratio:  0.89 Kirk Ruths MD Electronically signed by Kirk Ruths MD Signature Date/Time: 10/25/2021/4:43:55 PM    Final    CT ABDOMEN PELVIS WO CONTRAST  Result Date: 10/25/2021 CLINICAL DATA:  Abdominal pain, acute, nonlocalized. Nausea and vomiting. EXAM: CT ABDOMEN AND PELVIS WITHOUT CONTRAST TECHNIQUE: Multidetector CT imaging of the abdomen and pelvis was performed following the standard protocol without IV contrast. RADIATION DOSE REDUCTION: This exam was performed according to the departmental dose-optimization program which includes automated exposure control, adjustment of the mA and/or kV according to patient size and/or use of iterative reconstruction technique. COMPARISON:  CT abdomen dated 10/01/2012. Chest CT angiogram dated 10/23/2021. FINDINGS: Lower chest: Small bilateral pleural effusions. The LEFT-sided pleural effusion is significantly decreased compared to the chest CT angiogram of 10/23/2021, status post drainage. There is associated bibasilar atelectasis. Additional dense mass-like consolidations are seen within the LEFT lower lung, lateral component measuring 6.6 x 4.1 cm (series 2, image 8) and posterior-medial component measuring 7 x 3 cm (series 2, image 20). The posterior-medial masslike consolidation was also described on the chest CT angiogram report of 10/23/2021 and are highly  suspicious for neoplastic masses. Hepatobiliary: No focal liver abnormality is seen. Small amount of layering sludge and/or stones within the gallbladder versus vicarious excretion of earlier contrast administration. Gallbladder is otherwise unremarkable. No pericholecystic inflammation. No bile duct dilatation is seen. Pancreas: Unremarkable. No pancreatic ductal dilatation or surrounding inflammatory changes. Spleen: Normal in size without focal abnormality. Adrenals/Urinary Tract: 1.3 cm mass associated with the LEFT adrenal gland with Hounsfield unit measurement of 25, stable compared to the earlier CT abdomen of 10/01/2012. No follow-up imaging is recommended for this benign finding. RIGHT adrenal gland is unremarkable. Kidneys are unremarkable without mass, stone or hydronephrosis.  No ureteral calculi are identified. Bladder is unremarkable, partially decompressed. Stomach/Bowel: Fairly prominent stool ball in the rectal vault. Rectosigmoid colon is otherwise unremarkable. Diverticulosis is present within the upper sigmoid colon but no focal inflammatory change to suggest acute diverticulitis. Questionable thickening of the walls of the descending colon, with subtle pericolonic inflammation/fluid stranding, suspicious for a localized colitis of infectious or inflammatory nature. Remainder of the colon is unremarkable. Appendix is not seen but there are no inflammatory changes about the cecum to suggest acute appendicitis. Stomach is unremarkable, partially decompressed. Vascular/Lymphatic: Extensive aortic atherosclerosis. Infrarenal abdominal aorta is grossly stable in caliber. 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 Bypass graft again noted from the RIGHT common iliac artery to the RIGHT kidney. No acute-appearing vascular abnormality is identified, although characterization is limited by the lack of IV contrast. No enlarged lymph nodes  are seen within the abdomen or pelvis. Reproductive: Uterus and bilateral adnexa are unremarkable. Other: No free fluid, hemorrhage or abscess collection is seen within the abdomen or pelvis. No free intraperitoneal air. Musculoskeletal: Chronic compression fracture deformity of the T11 vertebral body, status post earlier vertebroplasty, stable appearance compared to the earlier CT abdomen of 10/01/2012. No acute-appearing osseous abnormality. IMPRESSION: 1. Questionable thickening of the walls of the descending colon, with subtle pericolonic inflammation/fluid stranding, suspicious for a localized mild colitis of infectious or inflammatory nature. No bowel obstruction or evidence of small bowel inflammation. 2. Small bilateral pleural effusions. The LEFT-sided pleural effusion is significantly decreased compared to the chest CT angiogram of 10/23/2021, status post drainage. There is associated bibasilar atelectasis. 3. Additional dense mass-like consolidations are seen within the LEFT lower lung, both possibly pleural based, lateral component measuring 6.6 x 4.1 cm and posterior-medial component measuring 7 x 3 cm. The posterior-medial masslike consolidation was also described on the chest CT angiogram report of 10/23/2021 and are highly suspicious for neoplastic masses. 4. Fairly prominent stool ball in the rectal vault. 5. Sigmoid colon diverticulosis without evidence of acute diverticulitis. 6. Small amount of layering sludge and/or stones within the gallbladder versus vicarious excretion of earlier contrast administration. No evidence of acute cholecystitis. Aortic Atherosclerosis (ICD10-I70.0). Electronically Signed   By: Franki Cabot M.D.   On: 10/25/2021 12:55   [place] EKG SITE RITE  Result Date: 10/25/2021 If Site Rite image not attached, placement could not be confirmed due to current cardiac rhythm.  DG Chest Port 1 View  Result Date: 10/25/2021 CLINICAL DATA:  Pleural effusion on left. EXAM: PORTABLE  CHEST 1 VIEW COMPARISON:  Chest x-ray dated October 24, 2021 FINDINGS: Visualized cardiac and mediastinal contours are unchanged, including left hilar fullness. Small left pleural effusion is slightly decreased in size when compared with prior exam. Left-sided chest tube in place. Bibasilar atelectasis. No evidence of pneumothorax. IMPRESSION: Small left pleural effusion is slightly decreased in size when compared to prior exam. Left-sided chest tube in place. Electronically Signed   By: Yetta Glassman M.D.   On: 10/25/2021 08:51   DG Chest Port 1 View  Result Date: 10/24/2021 CLINICAL DATA:  [id number].  Chest tube and left pleural effusion. EXAM: PORTABLE CHEST 1 VIEW COMPARISON:  Portable chest yesterday at 7:16 p.m. FINDINGS: 4:30 a.m. Pigtail left chest tube positioning is unaltered. There is no visible pneumothorax. Moderate-sized left pleural effusion is noted with mild improvement, previously extending to the mid hilar level whereas now the meniscus is at the level of the inferior hilum. There  may be some loculated fluid about the hilum, most likely in the fissure if present. There is left basilar overlying consolidation or atelectasis with the bilateral lungs elsewhere clear. There is cardiomegaly without vascular congestion. The aorta is tortuous and calcified. Osteopenia and thoracic degenerative changes. Kyphoplasty cement at T11. IMPRESSION: Moderate left pleural effusion with improvement. Stable left chest tube positioning with no visible pneumothorax. Query left perihilar loculated fluid in the fissure, with left basilar consolidation or atelectasis. Aortic atherosclerosis. Electronically Signed   By: Telford Nab M.D.   On: 10/24/2021 07:05   DG Chest 1 View  Result Date: 10/23/2021 CLINICAL DATA:  [id number] EXAM: CHEST  1 VIEW COMPARISON:  Chest x-ray 10/23/2021 FINDINGS: The heart and mediastinal contours are grossly within normal limits with silhouetting off of the left ventricle due to lung  disease. Atherosclerotic plaque. Interval placement of a left chest tube with pigtail overlying the mid to lower left lung zone. Slightly improved aeration of the left upper lobe. No pulmonary edema. Slight interval decrease in size of an at least moderate sized left pleural effusion. No pneumothorax. No acute osseous abnormality. Degenerative changes of the right shoulder. IMPRESSION: 1. Interval placement of a left chest tube in appropriate position with slight interval decrease in size of an at least moderate volume left pleural effusion. 2.  Aortic Atherosclerosis (ICD10-I70.0). Electronically Signed   By: Iven Finn M.D.   On: 10/23/2021 19:34   CT Angio Chest PE W and/or Wo Contrast  Result Date: 10/23/2021 CLINICAL DATA:  History of fall abnormal chest x-ray EXAM: CT ANGIOGRAPHY CHEST WITH CONTRAST TECHNIQUE: Multidetector CT imaging of the chest was performed using the standard protocol during bolus administration of intravenous contrast. Multiplanar CT image reconstructions and MIPs were obtained to evaluate the vascular anatomy. RADIATION DOSE REDUCTION: This exam was performed according to the departmental dose-optimization program which includes automated exposure control, adjustment of the mA and/or kV according to patient size and/or use of iterative reconstruction technique. CONTRAST:  30m OMNIPAQUE IOHEXOL 350 MG/ML SOLN COMPARISON:  Chest x-ray 10/23/2021, 02/07/2020, CT chest 03/16/2016, CT 10/01/2012 FINDINGS: Cardiovascular: Satisfactory opacification of the pulmonary arteries to the segmental level. No acute pulmonary embolism is visualized. Advanced aortic calcification. Poor contrast opacification of the aortic arch and descending thoracic aorta. Chronically displaced wall calcifications at the mid descending thoracic aorta, maximum aortic diameter of 3.6 cm at this level. Coronary vascular calcifications. Normal cardiac size. No pericardial effusion. Mediastinum/Nodes: Midline  trachea. Heterogeneous enlarged left lobe of thyroid with multiple nodules, unchanged. Stability for greater than 5 years implies benignity; no biopsy or followup indicated (ref: J Am Coll Radiol. 2015 Feb;12(2): 143-50). Mildly enlarged right paratracheal node measuring 11 mm, series 7, image 53. Upper esophagus contains a small amount of debris. The distal esophagus is decompressed. There is ill-defined soft tissue density within the subcarinal region that is difficult to separate from the decompressed esophagus. This appears contiguous with soft tissue density surrounding the left lower lobe bronchus. Mass effect on the left atrium. Lungs/Pleura: Emphysema. Large left-sided pleural effusion with shift of mediastinal contents to the right. Atelectasis of most of the left lung. Heterogeneous hypodense areas within left lower lobe consolidative process, series 7, image 91 suggestive of necrosis. Left lower lobe consolidative process cannot be separated from the soft tissue abnormality surrounding the left lower lobe bronchus and extending into the mediastinum. Delayed images of the lower chest demonstrate a heterogenous enhancing mass abutting or arising from the left posterior pleural surface, this measures  7 by 3.6 cm, series 10, image 26. Suspicion of additional pleural based lesion posterior medially at the level of the distal arch, series 7 image 49. mild irregular narrowing of left upper lobe bronchus as well. Upper Abdomen: No acute process in the upper abdomen. Stable 1.4 cm left adrenal mass, likely representing adenoma given long-term stability, no follow-up imaging is recommended. Chronic mild aneurysmal dilatation of the left renal artery. Musculoskeletal: No acute osseous abnormality Review of the MIP images confirms the above findings. IMPRESSION: 1. Negative for acute pulmonary embolus. 2. Large left-sided pleural effusion with shift of mediastinal contents to the right. Atelectasis of most of the  left lung. Occluded left lower lobe bronchus. Poorly defined soft tissue density/suspected hilar/infrahilar and left lower lobe mass, poorly separated from ill-defined subcarinal soft tissue density. This exerts mass effect on the left atrium. Findings concerning for malignancy. Mass or consolidation in the left lower lobe demonstrates heterogenous hypodensities suggesting necrosis. Pulmonary consultation is recommended. 3. Additional finding of heterogenous enhancing left lower lobe mass measuring up to 7 cm either abutting or arising from the pleural surface, concerning for metastatic lesion. Suspicion of additional probable pleural based mass slightly more superior at the level of the distal arch. Critical Value/emergent results were called by telephone at the time of interpretation on 10/23/2021 at 6:54 pm to provider [facility] , who verbally acknowledged these results. Aortic Atherosclerosis (ICD10-I70.0) and Emphysema (ICD10-J43.9). Electronically Signed   By: Donavan Foil M.D.   On: 10/23/2021 18:54   CT HEAD WO CONTRAST (5MM)  Result Date: 10/23/2021 CLINICAL DATA:  Fall EXAM: CT HEAD WITHOUT CONTRAST CT CERVICAL SPINE WITHOUT CONTRAST TECHNIQUE: Multidetector CT imaging of the head and cervical spine was performed following the standard protocol without intravenous contrast. Multiplanar CT image reconstructions of the cervical spine were also generated. RADIATION DOSE REDUCTION: This exam was performed according to the departmental dose-optimization program which includes automated exposure control, adjustment of the mA and/or kV according to patient size and/or use of iterative reconstruction technique. COMPARISON:  Brain MRI 07/02/2017 and cervical spine CT 03/16/2016 FINDINGS: CT HEAD FINDINGS Brain: There is no acute intracranial hemorrhage, extra-axial fluid collection, or acute infarct. Background parenchymal volume is normal for age. The ventricles are normal in size. Gray-white  differentiation is preserved. There is no mass lesion. There is no mass effect or midline shift. Vascular: There is calcification of the bilateral carotid siphons. Skull: Normal. Negative for fracture or focal lesion. Sinuses/Orbits: The imaged paranasal sinuses are clear. Bilateral lens implants are in place. The globes and orbits are otherwise unremarkable. Other: None. CT CERVICAL SPINE FINDINGS Alignment: Trace anterolisthesis of C3 on C4 and C4 on C5 and trace retrolisthesis of C5 on C6 is unchanged since 2018. There is no jumped or perched facet or other evidence of traumatic malalignment. Skull base and vertebrae: Skull base alignment is maintained. Vertebral body heights are preserved. There is no evidence of acute fracture. There is no suspicious osseous lesion. Soft tissues and spinal canal: No prevertebral fluid or swelling. No visible canal hematoma. Disc levels: Disc space narrowing and degenerative endplate change most advanced at C5-C6. Facet arthropathy is most advanced at C2-C3 bilaterally and on the left at C3-C4. Upper chest: The lungs are assessed on the separately dictated CTA chest. Other: None. IMPRESSION: 1. No acute intracranial pathology. 2. No acute fracture or traumatic malalignment of the cervical spine. Electronically Signed   By: Valetta Mole M.D.   On: 10/23/2021 18:28   CT Cervical  Spine Wo Contrast  Result Date: 10/23/2021 CLINICAL DATA:  Fall EXAM: CT HEAD WITHOUT CONTRAST CT CERVICAL SPINE WITHOUT CONTRAST TECHNIQUE: Multidetector CT imaging of the head and cervical spine was performed following the standard protocol without intravenous contrast. Multiplanar CT image reconstructions of the cervical spine were also generated. RADIATION DOSE REDUCTION: This exam was performed according to the departmental dose-optimization program which includes automated exposure control, adjustment of the mA and/or kV according to patient size and/or use of iterative reconstruction technique.  COMPARISON:  Brain MRI 07/02/2017 and cervical spine CT 03/16/2016 FINDINGS: CT HEAD FINDINGS Brain: There is no acute intracranial hemorrhage, extra-axial fluid collection, or acute infarct. Background parenchymal volume is normal for age. The ventricles are normal in size. Gray-white differentiation is preserved. There is no mass lesion. There is no mass effect or midline shift. Vascular: There is calcification of the bilateral carotid siphons. Skull: Normal. Negative for fracture or focal lesion. Sinuses/Orbits: The imaged paranasal sinuses are clear. Bilateral lens implants are in place. The globes and orbits are otherwise unremarkable. Other: None. CT CERVICAL SPINE FINDINGS Alignment: Trace anterolisthesis of C3 on C4 and C4 on C5 and trace retrolisthesis of C5 on C6 is unchanged since 2018. There is no jumped or perched facet or other evidence of traumatic malalignment. Skull base and vertebrae: Skull base alignment is maintained. Vertebral body heights are preserved. There is no evidence of acute fracture. There is no suspicious osseous lesion. Soft tissues and spinal canal: No prevertebral fluid or swelling. No visible canal hematoma. Disc levels: Disc space narrowing and degenerative endplate change most advanced at C5-C6. Facet arthropathy is most advanced at C2-C3 bilaterally and on the left at C3-C4. Upper chest: The lungs are assessed on the separately dictated CTA chest. Other: None. IMPRESSION: 1. No acute intracranial pathology. 2. No acute fracture or traumatic malalignment of the cervical spine. Electronically Signed   By: Valetta Mole M.D.   On: 10/23/2021 18:28   DG Chest 2 View  Result Date: 10/23/2021 CLINICAL DATA:  Trauma, fall, difficulty breathing EXAM: CHEST - 2 VIEW COMPARISON:  02/07/2020 FINDINGS: Transplant diameter heart is increased. There is almost complete opacification of left hemithorax. Findings suggest large left pleural effusion. Evaluation of right lung for infiltrates is  limited by the large effusion. In view of history of fall, possibility of hemothorax is not excluded. Right lung is clear. Right lateral CP angle is clear. There is no pneumothorax. There is evidence of previous vertebroplasty in lower thoracic spine. Degenerative changes are noted in both shoulders. IMPRESSION: There is interval appearance of large left pleural effusion. This may suggest parapneumonic effusion or hemothorax or neoplastic process. Follow-up CT chest may be considered. Electronically Signed   By: Elmer Picker M.D.   On: 10/23/2021 16:12    Assessment/Plan  1. Pleural effusion on left -   S/p pigtail catheter x2, s/p cefepime/Flagyl x7 days, catheter removed 9/17 -   Family elected comfort measures/hospice  2. Acute respiratory failure with hypoxia (HCC) -   Increased O2 from 3 L/minute to 4 L/minute via [place] continuously  3. Lung mass -   IR recommended repeat CT in 6 weeks before biopsy is attempted.  Pulmonology recommends IR biopsy once pigtail is removed -   Family elected comfort measures/hospice  4. Essential hypertension -   BP is a stable, continue metoprolol tartrate  5. Hypothyroidism, unspecified type -   Continue levothyroxine  6. Slow transit constipation -Will start on senna S 8.6-50 mg 2 tabs  at bedtime  7. PAF with RVR -Now NSR, continue metoprolol tartrate for rate control and no anticoagulation   Family/ staff Communication: Discussed plan of care with resident and charge nurse.  Labs/tests ordered:   None    Durenda Age, DNP, MSN, FNP-BC [facility] [phone] (Monday-Friday 8:00 a.m. - 5:00 p.m.) [phone] (after hours)

## 2021-11-06 NOTE — Progress Notes (Deleted)
Office Visit    Patient Name: [name] Date of Encounter: 11/06/2021  Primary Care Provider:  [name], MD Primary Cardiologist:  Candee Furbish, MD Primary Electrophysiologist: None  Chief Complaint    [name] is a 87 y.o. female with PMH of HTN, DM, HLD aortic atherosclerosis, aortic stenosis, OSA (on CPAP), pituitary adenoma s/p resection 2004, 2009 and 2010, history of CPPD, OA and chronic low back pain who presents today for posthospital follow-up. Past Medical History    Past Medical History:  Diagnosis Date   Allergic rhinitis    Arthritis    Complication of anesthesia    Hard time waking up   COPD (chronic obstructive pulmonary disease) ([facility])    PFT 08/08/09-FEV1 1.98/107; R 0.77; small airway obst w/tresp to dilator;DLCO 44%   Diabetes mellitus    Fibromuscular dysplasia ([facility])    RAS   Hyperlipidemia    Hypertension    PONV (postoperative nausea and vomiting)    Pseudogout    Sleep apnea    AHI 89/hr   Past Surgical History:  Procedure Laterality Date   BACK SURGERY  2005   bilateral renal bypass     ESOPHAGOGASTRODUODENOSCOPY N/A 10/01/2012   Procedure: ESOPHAGOGASTRODUODENOSCOPY (EGD);  Surgeon: Missy Sabins, MD;  Location: [facility] ENDOSCOPY;  Service: Endoscopy;  Laterality: N/A;   nasal septal deviation     pituitary tumor removed     x 2   TONSILLECTOMY      Allergies  Allergies  Allergen Reactions   Valacyclovir Hcl Rash    History of Present Illness    [name]  is a 87 year old female with the above mention past medical history who presents today for posthospital follow-up new onset A-fib with RVR.  She was initially seen by Dr. Percival Spanish in 02/2010 for evaluation of aortic stenosis.  Patient presented on 10/24/2021 with complaint of palpitations.  EKG was completed that showed new onset AF with RVR.  Patient was unaware that she was in atrial fibrillation.  Patient's only symptom was extreme weakness.   Patient suffered mechanical fall secondary to weakness and dehydration.  In the ED patient was started on IV amiodarone with spontaneous conversion to sinus rhythm.  Chest CT completed showing bilateral pleural effusions requiring chest tube insertion.  Left lower lobe mass that appears to be pleural-based awaiting cytology.  She developed AF with RVR on 9/6 and became hypotensive requiring Neo-Synephrine drip.  2D echo completed during admission showing hyperdynamic EF of 75%, no RWMA, moderate LVH, grade 1 DD, trivial MV regurgitation, severe aortic stenosis with mean gradient of 21.5 mmHg.  Patient was transitioned to p.o. amiodarone 200 mg twice daily and anticoagulation was held due to chest tube insertion.  Rate control currently with beta-blocker low-dose due to hypotension.  Patient was discharged with hospice care.   Since last being seen in the office patient reports***.  Patient denies chest pain, palpitations, dyspnea, PND, orthopnea, nausea, vomiting, dizziness, syncope, edema, weight gain, or early satiety.     ***Notes: -Patient had AF with RVR during hospitalization Home Medications    Current Outpatient Medications  Medication Sig Dispense Refill   acetaminophen (TYLENOL) 325 MG tablet Take 325-650 mg by mouth every 6 (six) hours as needed for headache or mild pain (CANNOT EXCEED A SUM TOTAL OF 3,000 MG/DAY- FROM ALL COMBINED SOURCES and phone MD, if no relief).     bisacodyl (DULCOLAX) 10 MG suppository Place 10 mg rectally as  needed for moderate constipation.     levothyroxine (SYNTHROID) 50 MCG tablet Take 50 mcg by mouth daily.     melatonin 3 MG TABS tablet Take 1 tablet (3 mg total) by mouth at bedtime.  0   metoprolol tartrate (LOPRESSOR) 25 MG tablet Take 0.5 tablets (12.5 mg total) by mouth 2 (two) times daily.     polyvinyl alcohol (LIQUIFILM TEARS) 1.4 % ophthalmic solution Place 1 drop into both eyes as needed for dry eyes.     Sodium Phosphates (RA SALINE ENEMA RE)  Place 1 enema rectally as needed.     No current facility-administered medications for this visit.     Review of Systems  Please see the history of present illness.    (+)*** (+)***  All other systems reviewed and are otherwise negative except as noted above.  Physical Exam    Wt Readings from Last 3 Encounters:  11/03/21 141 lb 5 oz (64.1 kg)  02/23/20 147 lb (66.7 kg)  02/21/20 147 lb (66.7 kg)   TF:TDDUK were no vitals filed for this visit.,There is no height or weight on file to calculate BMI.  Constitutional:      Appearance: Healthy appearance. Not in distress.  Neck:     Vascular: JVD normal.  Pulmonary:     Effort: Pulmonary effort is normal.     Breath sounds: No wheezing. No rales. Diminished in the bases Cardiovascular:     Normal rate. Regular rhythm. Normal S1. Normal S2.      Murmurs: There is no murmur.  Edema:    Peripheral edema absent.  Abdominal:     Palpations: Abdomen is soft non tender. There is no hepatomegaly.  Skin:    General: Skin is warm and dry.  Neurological:     General: No focal deficit present.     Mental Status: Alert and oriented to person, place and time.     Cranial Nerves: Cranial nerves are intact.  EKG/LABS/Other Studies Reviewed    ECG personally reviewed by me today - ***  Risk Assessment/Calculations:   {Does this patient have ATRIAL FIBRILLATION?:[phone]}        Lab Results  Component Value Date   WBC 14.2 (H) 11/03/2021   HGB 9.1 (L) 11/03/2021   HCT 29.3 (L) 11/03/2021   MCV 90.2 11/03/2021   PLT 369 11/03/2021   Lab Results  Component Value Date   CREATININE 1.16 (H) 11/03/2021   BUN 18 11/03/2021   NA 135 11/03/2021   K 4.2 11/03/2021   CL 104 11/03/2021   CO2 27 11/03/2021   Lab Results  Component Value Date   ALT 15 10/28/2021   AST 31 10/28/2021   ALKPHOS 115 10/28/2021   BILITOT 0.5 10/28/2021   No results found for: "CHOL", "HDL", "LDLCALC", "LDLDIRECT", "TRIG", "CHOLHDL"  Lab Results   Component Value Date   HGBA1C 5.3 02/07/2020    Assessment & Plan    1.  Severe aortic stenosis: -EF of 75%, no RWMA, moderate LVH, grade 1 DD, trivial MV regurgitation, severe aortic stenosis with mean gradient of 21.5 mmHg.  2.  Paroxysmal atrial fibrillation: -Today patient is*** -Rate controlled with Lopressor 12.5 mg twice daily -No anticoagulation due to recent fall  3.  Essential hypertension: -Blood pressure today was*** -Continue metoprolol as noted above  4.  Acute respiratory failure with hypoxia/lung mass: -Patient currently on nasal cannula -Patient elected to have comfort measures and hospice         Disposition: Follow-up  with Candee Furbish, MD or APP in *** months {Are you ordering a CV Procedure (e.g. stress test, cath, DCCV, TEE, etc)?   Press F2        :[id number]}   Medication Adjustments/Labs and Tests Ordered: Current medicines are reviewed at length with the patient today.  Concerns regarding medicines are outlined above.   Signed, Mable Fill, Marissa Nestle, NP 11/06/2021, 1:05 PM [place]

## 2021-11-07 NOTE — Addendum Note (Signed)
Addended by: Durenda Age C on: 11/07/2021 12:11 AM   Modules accepted: Level of Service

## 2021-11-08 ENCOUNTER — Ambulatory Visit: Payer: Medicare PPO | Admitting: Nurse Practitioner

## 2021-11-08 DIAGNOSIS — J9601 Acute respiratory failure with hypoxia: Secondary | ICD-10-CM

## 2021-11-08 DIAGNOSIS — I480 Paroxysmal atrial fibrillation: Secondary | ICD-10-CM

## 2021-11-12 ENCOUNTER — Non-Acute Institutional Stay (SKILLED_NURSING_FACILITY): Admitting: Family Medicine

## 2021-11-12 DIAGNOSIS — R627 Adult failure to thrive: Secondary | ICD-10-CM

## 2021-11-12 DIAGNOSIS — J449 Chronic obstructive pulmonary disease, unspecified: Secondary | ICD-10-CM | POA: Diagnosis not present

## 2021-11-12 DIAGNOSIS — I480 Paroxysmal atrial fibrillation: Secondary | ICD-10-CM | POA: Diagnosis not present

## 2021-11-12 DIAGNOSIS — J9601 Acute respiratory failure with hypoxia: Secondary | ICD-10-CM | POA: Diagnosis not present

## 2021-11-12 DIAGNOSIS — R918 Other nonspecific abnormal finding of lung field: Secondary | ICD-10-CM | POA: Diagnosis not present

## 2021-11-12 DIAGNOSIS — I350 Nonrheumatic aortic (valve) stenosis: Secondary | ICD-10-CM | POA: Diagnosis not present

## 2021-11-12 DIAGNOSIS — E1149 Type 2 diabetes mellitus with other diabetic neurological complication: Secondary | ICD-10-CM | POA: Diagnosis not present

## 2021-11-12 NOTE — Progress Notes (Signed)
Provider:  Alain Honey, MD Location:      Place of Service:     PCP: [name], MD Patient Care Team: Tisovec, Fransico Him, MD as PCP - General (Internal Medicine) Jerline Pain, MD as PCP - Cardiology (Cardiology) Deneise Lever, MD (Pulmonary Disease)  Extended Emergency Contact Information Primary Emergency Contact: Janeann Forehand Address: [place], [place] Phone: [phone] Mobile Phone: [phone] Relation: Daughter  Code Status:  Goals of Care: Advanced Directive information    11/06/2021   10:47 AM  Advanced Directives  Does Patient Have a Medical Advance Directive? Yes  Type of Advance Directive Out of facility DNR (pink MOST or yellow form)  Does patient want to make changes to medical advance directive? No - Patient declined      No chief complaint on file.   HPI: Patient is a 87 y.o. female seen today for admission to [facility].  She was hospitalized from September 6 to September 19 with a left-sided pleural effu the effusion was drained but is there is some loculated fluid still remaining as well as a mass.  She is discharged here for comfort care.  Hospice has been called in.  Prior to that hospitalization she was living in her apartment at [place]. Chronic medical problems include aortic stenosis, atrial fibrillation, COPD, 2 diabetes mellitus with neuropathy, hypertension, obstructive sleep apnea, hyperlipidemia.  In the hospital, she was treated in intensive care for hypotension, requiring pressors. Prior to discharge family meeting held with daughter and comfort measures were elected Past Medical History:  Diagnosis Date   Allergic rhinitis    Arthritis    Complication of anesthesia    Hard time waking up   COPD (chronic obstructive pulmonary disease) ([place])    PFT 08/08/09-FEV1 1.98/107; R 0.77; small airway obst w/tresp to dilator;DLCO 44%   Diabetes  mellitus    Fibromuscular dysplasia (HCC)    RAS   Hyperlipidemia    Hypertension    PONV (postoperative nausea and vomiting)    Pseudogout    Sleep apnea    AHI 89/hr   Past Surgical History:  Procedure Laterality Date   BACK SURGERY  2005   bilateral renal bypass     ESOPHAGOGASTRODUODENOSCOPY N/A 10/01/2012   Procedure: ESOPHAGOGASTRODUODENOSCOPY (EGD);  Surgeon: Missy Sabins, MD;  Location: [facility] ENDOSCOPY;  Service: Endoscopy;  Laterality: N/A;   nasal septal deviation     pituitary tumor removed     x 2   TONSILLECTOMY      reports that she quit smoking about 24 years ago. Her smoking use included cigarettes. She has a 35.00 pack-year smoking history. She has never used smokeless tobacco. She reports that she does not drink alcohol and does not use drugs. Social History   Socioeconomic History   Marital status: Widowed    Spouse name: Not on file   Number of children: Not on file   Years of education: Not on file   Highest education level: Not on file  Occupational History   Occupation: retired school media specialist/librarian  Tobacco Use   Smoking status: Former    Packs/day: 1.00    Years: 35.00    Total pack years: 35.00    Types: Cigarettes    Quit date: 02/17/1997    Years since quitting: 24.7   Smokeless tobacco: Never  Substance and Sexual Activity  Alcohol use: No   Drug use: No   Sexual activity: Not on file  Other Topics Concern   Not on file  Social History Narrative   Not on file   Social Determinants of Health   Financial Resource Strain: Not on file  Food Insecurity: No Food Insecurity (11/02/2021)   Hunger Vital Sign    Worried About Running Out of Food in the Last Year: Never true    Ran Out of Food in the Last Year: Never true  Transportation Needs: No Transportation Needs (11/02/2021)   PRAPARE - Hydrologist (Medical): No    Lack of Transportation (Non-Medical): No  Physical Activity: Not on file  Stress:  Not on file  Social Connections: Not on file  Intimate Partner Violence: Not At Risk (11/02/2021)   Humiliation, Afraid, Rape, and Kick questionnaire    Fear of Current or Ex-Partner: No    Emotionally Abused: No    Physically Abused: No    Sexually Abused: No    Functional Status Survey:    Family History  Problem Relation Age of Onset   Stroke Father    Breast cancer Mother     Health Maintenance  Topic Date Due   FOOT EXAM  Never done   OPHTHALMOLOGY EXAM  Never done   TETANUS/TDAP  Never done   Zoster Vaccines- Shingrix (1 of 2) Never done   Pneumonia Vaccine 57+ Years old (1 - PCV) Never done   DEXA SCAN  Never done   COVID-19 Vaccine (4 - Moderna series) 03/21/2020   HEMOGLOBIN A1C  08/07/2020   INFLUENZA VACCINE  09/17/2021   HPV VACCINES  Aged Out    Allergies  Allergen Reactions   Valacyclovir Hcl Rash    Outpatient Encounter Medications as of 11/12/2021  Medication Sig   acetaminophen (TYLENOL) 325 MG tablet Take 325-650 mg by mouth every 6 (six) hours as needed for headache or mild pain (CANNOT EXCEED A SUM TOTAL OF 3,000 MG/DAY- FROM ALL COMBINED SOURCES and phone MD, if no relief).   bisacodyl (DULCOLAX) 10 MG suppository Place 10 mg rectally as needed for moderate constipation.   levothyroxine (SYNTHROID) 50 MCG tablet Take 50 mcg by mouth daily.   melatonin 3 MG TABS tablet Take 1 tablet (3 mg total) by mouth at bedtime.   metoprolol tartrate (LOPRESSOR) 25 MG tablet Take 0.5 tablets (12.5 mg total) by mouth 2 (two) times daily.   polyvinyl alcohol (LIQUIFILM TEARS) 1.4 % ophthalmic solution Place 1 drop into both eyes as needed for dry eyes.   Sodium Phosphates (RA SALINE ENEMA RE) Place 1 enema rectally as needed.   No facility-administered encounter medications on file as of 11/12/2021.    Review of Systems  Constitutional:  Positive for activity change and fatigue.  HENT: Negative.    Respiratory:  Positive for shortness of breath.    Cardiovascular:  Positive for chest pain.       Pain primarily in the area of chest tube  Genitourinary: Negative.   Neurological: Negative.   Hematological: Negative.   Psychiatric/Behavioral: Negative.    All other systems reviewed and are negative.   There were no vitals filed for this visit. There is no height or weight on file to calculate BMI. Physical Exam Vitals and nursing note reviewed.  Constitutional:      Appearance: Normal appearance.  HENT:     Head: Normocephalic.  Eyes:     Extraocular Movements: Extraocular movements intact.  Pupils: Pupils are equal, round, and reactive to light.  Cardiovascular:     Rate and Rhythm: Normal rate. Rhythm irregular.  Pulmonary:     Effort: Pulmonary effort is normal.     Comments: Decreased breath sounds at left base consistent with effusion Abdominal:     General: Abdomen is flat. Bowel sounds are normal.     Palpations: Abdomen is soft.  Musculoskeletal:        General: Normal range of motion.     Cervical back: Normal range of motion.  Skin:    Comments: Feet are cold but not discolored.  Nails are dystrophic.  Did not feel like going to see a podiatrist this morning  Neurological:     General: No focal deficit present.     Mental Status: She is alert and oriented to person, place, and time.  Psychiatric:        Mood and Affect: Mood normal.        Behavior: Behavior normal.     Labs reviewed: Basic Metabolic Panel: Recent Labs    10/28/21 0440 10/29/21 0443 10/29/21 0525 10/30/21 0521 10/31/21 0422 11/01/21 0458 11/02/21 0415 11/03/21 0500  NA 141   < >  --  137 134* 135 136 135  K 3.6   < >  --  3.0* 3.6 3.5 4.2 4.2  CL 109   < >  --  102 100 101 104 104  CO2 27   < >  --  '30 30 27 28 27  '$ GLUCOSE 120*   < >  --  95 85 64* 91 89  BUN 30*   < >  --  '16 15 16 18 18  '$ CREATININE 1.12*   < >  --  0.78 0.73 0.82 0.96 1.16*  CALCIUM 8.0*   < >  --  7.4* 7.4* 7.5* 7.6* 7.6*  MG 1.7  --  1.9 1.4* 2.0 1.8   --   --   PHOS 2.2*  --  3.4  --  2.6  --   --   --    < > = values in this interval not displayed.   Liver Function Tests: Recent Labs    10/26/21 0422 10/27/21 1021 10/28/21 0440  AST 12* 18 31  ALT '7 9 15  '$ ALKPHOS 112 101 115  BILITOT 0.7 0.4 0.5  PROT 5.3* 4.8* 5.1*  ALBUMIN 2.3* 2.0* 2.0*   Recent Labs    10/25/21 1048  LIPASE 31   No results for input(s): "AMMONIA" in the last 8760 hours. CBC: Recent Labs    10/23/21 1545 10/24/21 0008 10/31/21 0422 11/01/21 0458 11/02/21 0415 11/03/21 0500  WBC 10.3   < > 13.0* 12.9* 13.0* 14.2*  NEUTROABS 8.3*  --  9.6* 9.6*  --   --   HGB 13.6   < > 10.8* 11.1* 9.8* 9.1*  HCT 43.4   < > 33.6* 35.0* 30.8* 29.3*  MCV 89.9   < > 88.0 88.8 90.3 90.2  PLT 572*   < > 370 380 376 369   < > = values in this interval not displayed.   Cardiac Enzymes: No results for input(s): "CKTOTAL", "CKMB", "CKMBINDEX", "TROPONINI" in the last 8760 hours. BNP: Invalid input(s): "POCBNP" Lab Results  Component Value Date   HGBA1C 5.3 02/07/2020   Lab Results  Component Value Date   TSH 0.034 (L) 10/24/2021   Lab Results  Component Value Date   VITAMINB12 271 01/12/2008   No results  found for: "FOLATE" Lab Results  Component Value Date   IRON 15 (L) 01/12/2008   TIBC 338 01/12/2008    Imaging and Procedures obtained prior to SNF admission: DG Chest Port 1 View  Result Date: 10/24/2021 CLINICAL DATA:  [id number].  Chest tube and left pleural effusion. EXAM: PORTABLE CHEST 1 VIEW COMPARISON:  Portable chest yesterday at 7:16 p.m. FINDINGS: 4:30 a.m. Pigtail left chest tube positioning is unaltered. There is no visible pneumothorax. Moderate-sized left pleural effusion is noted with mild improvement, previously extending to the mid hilar level whereas now the meniscus is at the level of the inferior hilum. There may be some loculated fluid about the hilum, most likely in the fissure if present. There is left basilar overlying consolidation  or atelectasis with the bilateral lungs elsewhere clear. There is cardiomegaly without vascular congestion. The aorta is tortuous and calcified. Osteopenia and thoracic degenerative changes. Kyphoplasty cement at T11. IMPRESSION: Moderate left pleural effusion with improvement. Stable left chest tube positioning with no visible pneumothorax. Query left perihilar loculated fluid in the fissure, with left basilar consolidation or atelectasis. Aortic atherosclerosis. Electronically Signed   By: Telford Nab M.D.   On: 10/24/2021 07:05   DG Chest 1 View  Result Date: 10/23/2021 CLINICAL DATA:  [id number] EXAM: CHEST  1 VIEW COMPARISON:  Chest x-ray 10/23/2021 FINDINGS: The heart and mediastinal contours are grossly within normal limits with silhouetting off of the left ventricle due to lung disease. Atherosclerotic plaque. Interval placement of a left chest tube with pigtail overlying the mid to lower left lung zone. Slightly improved aeration of the left upper lobe. No pulmonary edema. Slight interval decrease in size of an at least moderate sized left pleural effusion. No pneumothorax. No acute osseous abnormality. Degenerative changes of the right shoulder. IMPRESSION: 1. Interval placement of a left chest tube in appropriate position with slight interval decrease in size of an at least moderate volume left pleural effusion. 2.  Aortic Atherosclerosis (ICD10-I70.0). Electronically Signed   By: Iven Finn M.D.   On: 10/23/2021 19:34   CT Angio Chest PE W and/or Wo Contrast  Result Date: 10/23/2021 CLINICAL DATA:  History of fall abnormal chest x-ray EXAM: CT ANGIOGRAPHY CHEST WITH CONTRAST TECHNIQUE: Multidetector CT imaging of the chest was performed using the standard protocol during bolus administration of intravenous contrast. Multiplanar CT image reconstructions and MIPs were obtained to evaluate the vascular anatomy. RADIATION DOSE REDUCTION: This exam was performed according to the departmental  dose-optimization program which includes automated exposure control, adjustment of the mA and/or kV according to patient size and/or use of iterative reconstruction technique. CONTRAST:  56m OMNIPAQUE IOHEXOL 350 MG/ML SOLN COMPARISON:  Chest x-ray 10/23/2021, 02/07/2020, CT chest 03/16/2016, CT 10/01/2012 FINDINGS: Cardiovascular: Satisfactory opacification of the pulmonary arteries to the segmental level. No acute pulmonary embolism is visualized. Advanced aortic calcification. Poor contrast opacification of the aortic arch and descending thoracic aorta. Chronically displaced wall calcifications at the mid descending thoracic aorta, maximum aortic diameter of 3.6 cm at this level. Coronary vascular calcifications. Normal cardiac size. No pericardial effusion. Mediastinum/Nodes: Midline trachea. Heterogeneous enlarged left lobe of thyroid with multiple nodules, unchanged. Stability for greater than 5 years implies benignity; no biopsy or followup indicated (ref: J Am Coll Radiol. 2015 Feb;12(2): 143-50). Mildly enlarged right paratracheal node measuring 11 mm, series 7, image 53. Upper esophagus contains a small amount of debris. The distal esophagus is decompressed. There is ill-defined soft tissue density within the subcarinal region that is difficult  to separate from the decompressed esophagus. This appears contiguous with soft tissue density surrounding the left lower lobe bronchus. Mass effect on the left atrium. Lungs/Pleura: Emphysema. Large left-sided pleural effusion with shift of mediastinal contents to the right. Atelectasis of most of the left lung. Heterogeneous hypodense areas within left lower lobe consolidative process, series 7, image 91 suggestive of necrosis. Left lower lobe consolidative process cannot be separated from the soft tissue abnormality surrounding the left lower lobe bronchus and extending into the mediastinum. Delayed images of the lower chest demonstrate a heterogenous enhancing  mass abutting or arising from the left posterior pleural surface, this measures 7 by 3.6 cm, series 10, image 26. Suspicion of additional pleural based lesion posterior medially at the level of the distal arch, series 7 image 49. mild irregular narrowing of left upper lobe bronchus as well. Upper Abdomen: No acute process in the upper abdomen. Stable 1.4 cm left adrenal mass, likely representing adenoma given long-term stability, no follow-up imaging is recommended. Chronic mild aneurysmal dilatation of the left renal artery. Musculoskeletal: No acute osseous abnormality Review of the MIP images confirms the above findings. IMPRESSION: 1. Negative for acute pulmonary embolus. 2. Large left-sided pleural effusion with shift of mediastinal contents to the right. Atelectasis of most of the left lung. Occluded left lower lobe bronchus. Poorly defined soft tissue density/suspected hilar/infrahilar and left lower lobe mass, poorly separated from ill-defined subcarinal soft tissue density. This exerts mass effect on the left atrium. Findings concerning for malignancy. Mass or consolidation in the left lower lobe demonstrates heterogenous hypodensities suggesting necrosis. Pulmonary consultation is recommended. 3. Additional finding of heterogenous enhancing left lower lobe mass measuring up to 7 cm either abutting or arising from the pleural surface, concerning for metastatic lesion. Suspicion of additional probable pleural based mass slightly more superior at the level of the distal arch. Critical Value/emergent results were called by telephone at the time of interpretation on 10/23/2021 at 6:54 pm to provider MAD[facility] , who verbally acknowledged these results. Aortic Atherosclerosis (ICD10-I70.0) and Emphysema (ICD10-J43.9). Electronically Signed   By: Donavan Foil M.D.   On: 10/23/2021 18:54   CT HEAD WO CONTRAST (5MM)  Result Date: 10/23/2021 CLINICAL DATA:  Fall EXAM: CT HEAD WITHOUT CONTRAST CT CERVICAL SPINE  WITHOUT CONTRAST TECHNIQUE: Multidetector CT imaging of the head and cervical spine was performed following the standard protocol without intravenous contrast. Multiplanar CT image reconstructions of the cervical spine were also generated. RADIATION DOSE REDUCTION: This exam was performed according to the departmental dose-optimization program which includes automated exposure control, adjustment of the mA and/or kV according to patient size and/or use of iterative reconstruction technique. COMPARISON:  Brain MRI 07/02/2017 and cervical spine CT 03/16/2016 FINDINGS: CT HEAD FINDINGS Brain: There is no acute intracranial hemorrhage, extra-axial fluid collection, or acute infarct. Background parenchymal volume is normal for age. The ventricles are normal in size. Gray-white differentiation is preserved. There is no mass lesion. There is no mass effect or midline shift. Vascular: There is calcification of the bilateral carotid siphons. Skull: Normal. Negative for fracture or focal lesion. Sinuses/Orbits: The imaged paranasal sinuses are clear. Bilateral lens implants are in place. The globes and orbits are otherwise unremarkable. Other: None. CT CERVICAL SPINE FINDINGS Alignment: Trace anterolisthesis of C3 on C4 and C4 on C5 and trace retrolisthesis of C5 on C6 is unchanged since 2018. There is no jumped or perched facet or other evidence of traumatic malalignment. Skull base and vertebrae: Skull base alignment is maintained.  Vertebral body heights are preserved. There is no evidence of acute fracture. There is no suspicious osseous lesion. Soft tissues and spinal canal: No prevertebral fluid or swelling. No visible canal hematoma. Disc levels: Disc space narrowing and degenerative endplate change most advanced at C5-C6. Facet arthropathy is most advanced at C2-C3 bilaterally and on the left at C3-C4. Upper chest: The lungs are assessed on the separately dictated CTA chest. Other: None. IMPRESSION: 1. No acute  intracranial pathology. 2. No acute fracture or traumatic malalignment of the cervical spine. Electronically Signed   By: Valetta Mole M.D.   On: 10/23/2021 18:28   CT Cervical Spine Wo Contrast  Result Date: 10/23/2021 CLINICAL DATA:  Fall EXAM: CT HEAD WITHOUT CONTRAST CT CERVICAL SPINE WITHOUT CONTRAST TECHNIQUE: Multidetector CT imaging of the head and cervical spine was performed following the standard protocol without intravenous contrast. Multiplanar CT image reconstructions of the cervical spine were also generated. RADIATION DOSE REDUCTION: This exam was performed according to the departmental dose-optimization program which includes automated exposure control, adjustment of the mA and/or kV according to patient size and/or use of iterative reconstruction technique. COMPARISON:  Brain MRI 07/02/2017 and cervical spine CT 03/16/2016 FINDINGS: CT HEAD FINDINGS Brain: There is no acute intracranial hemorrhage, extra-axial fluid collection, or acute infarct. Background parenchymal volume is normal for age. The ventricles are normal in size. Gray-white differentiation is preserved. There is no mass lesion. There is no mass effect or midline shift. Vascular: There is calcification of the bilateral carotid siphons. Skull: Normal. Negative for fracture or focal lesion. Sinuses/Orbits: The imaged paranasal sinuses are clear. Bilateral lens implants are in place. The globes and orbits are otherwise unremarkable. Other: None. CT CERVICAL SPINE FINDINGS Alignment: Trace anterolisthesis of C3 on C4 and C4 on C5 and trace retrolisthesis of C5 on C6 is unchanged since 2018. There is no jumped or perched facet or other evidence of traumatic malalignment. Skull base and vertebrae: Skull base alignment is maintained. Vertebral body heights are preserved. There is no evidence of acute fracture. There is no suspicious osseous lesion. Soft tissues and spinal canal: No prevertebral fluid or swelling. No visible canal  hematoma. Disc levels: Disc space narrowing and degenerative endplate change most advanced at C5-C6. Facet arthropathy is most advanced at C2-C3 bilaterally and on the left at C3-C4. Upper chest: The lungs are assessed on the separately dictated CTA chest. Other: None. IMPRESSION: 1. No acute intracranial pathology. 2. No acute fracture or traumatic malalignment of the cervical spine. Electronically Signed   By: Valetta Mole M.D.   On: 10/23/2021 18:28   DG Chest 2 View  Result Date: 10/23/2021 CLINICAL DATA:  Trauma, fall, difficulty breathing EXAM: CHEST - 2 VIEW COMPARISON:  02/07/2020 FINDINGS: Transplant diameter heart is increased. There is almost complete opacification of left hemithorax. Findings suggest large left pleural effusion. Evaluation of right lung for infiltrates is limited by the large effusion. In view of history of fall, possibility of hemothorax is not excluded. Right lung is clear. Right lateral CP angle is clear. There is no pneumothorax. There is evidence of previous vertebroplasty in lower thoracic spine. Degenerative changes are noted in both shoulders. IMPRESSION: There is interval appearance of large left pleural effusion. This may suggest parapneumonic effusion or hemothorax or neoplastic process. Follow-up CT chest may be considered. Electronically Signed   By: Elmer Picker M.D.   On: 10/23/2021 16:12    Assessment/Plan 1. Acute respiratory failure with hypoxia ([place]) Patient suffered a fall at home.  No broken bones but on evaluation left pleural effusion found.  Required intubation pressor treatment.  Developed A-fib and treated with amiodarone  2. Aortic valve stenosis, etiology of cardiac valve disease unspecified This has been evaluated by cardiology and felt not to be clinically significant per notes I reviewed  3. Chronic obstructive pulmonary disease, unspecified COPD type ([place]) Followed by pulmonology.  Is now on chronic oxygen requiring significant amounts  to maintain PO2  4. FTT (failure to thrive) in adult Patient has been seen several times in clinic here by our group for weakness and anorexia  5. Lung mass No further work-up is planned.  Has a history of 35+ pack years of smoking  6. Paroxysmal atrial fibrillation with RVR ([place]) Patient currently on metoprolol but not on any therapy for anticoagulation  7. Type 2 diabetes mellitus with neurological complications ([place]) No current treatment for diabetes    Family/ staff Communication:   Labs/tests ordered:  Lillette Boxer. Sabra Heck, [place] [address] [place], [place] Office [phone]

## 2021-11-17 DEATH — deceased

## 2021-12-09 ENCOUNTER — Inpatient Hospital Stay: Payer: Medicare PPO | Admitting: Adult Health

## 2022-09-26 ENCOUNTER — Encounter: Payer: Self-pay | Admitting: Family Medicine

## 2022-09-29 ENCOUNTER — Encounter: Payer: Self-pay | Admitting: Family Medicine

## 2022-11-03 IMAGING — DX DG CHEST 1V PORT
1 series · 1 of 1 positions shown · non-contrast
Comparison: Chest radiograph dated 09/30/2012 and CT dated
03/16/2016.

CLINICAL DATA: 85-year-old female with fever.

EXAM:
PORTABLE CHEST 1 VIEW

[chest ap]
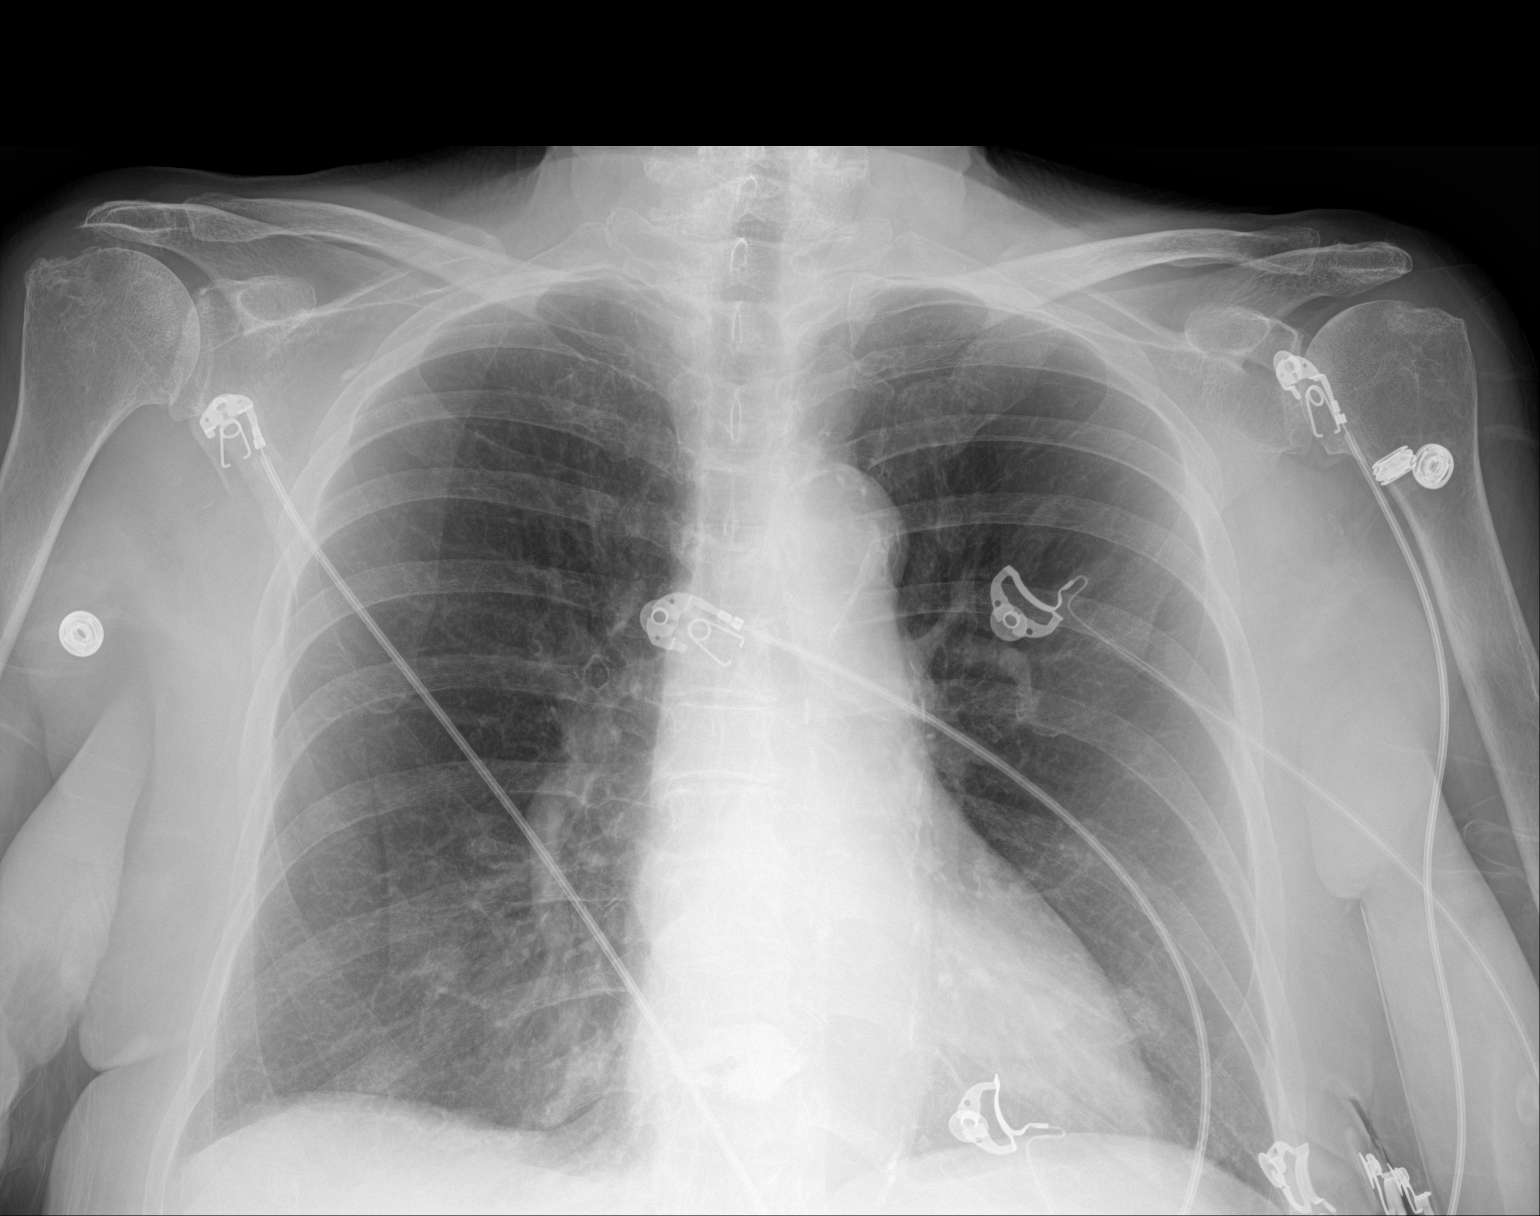

[1 of 1 positions shown; findings below may reference images not displayed]

FINDINGS: No focal consolidation, pleural effusion, pneumothorax. The cardiac
silhouette is within limits. Atherosclerotic calcification of the
aorta. Degenerative changes of the spine. Lower thoracic
vertebroplasty. No acute osseous pathology.
IMPRESSION: No active cardiopulmonary disease.
